# Patient Record
Sex: Female | Born: 1950 | Race: Black or African American | Hispanic: No | Smoking: Current every day smoker
Health system: Southern US, Community
[De-identification: ages and names within clinical notes are randomized; demographics above are authoritative.]

## PROBLEM LIST (undated history)

## (undated) DIAGNOSIS — I10 Essential (primary) hypertension: Secondary | ICD-10-CM

## (undated) DIAGNOSIS — T7840XA Allergy, unspecified, initial encounter: Secondary | ICD-10-CM

## (undated) DIAGNOSIS — M810 Age-related osteoporosis without current pathological fracture: Secondary | ICD-10-CM

## (undated) DIAGNOSIS — F419 Anxiety disorder, unspecified: Secondary | ICD-10-CM

## (undated) DIAGNOSIS — H3320 Serous retinal detachment, unspecified eye: Secondary | ICD-10-CM

## (undated) DIAGNOSIS — K219 Gastro-esophageal reflux disease without esophagitis: Secondary | ICD-10-CM

## (undated) DIAGNOSIS — H269 Unspecified cataract: Secondary | ICD-10-CM

## (undated) DIAGNOSIS — D649 Anemia, unspecified: Secondary | ICD-10-CM

## (undated) DIAGNOSIS — E785 Hyperlipidemia, unspecified: Secondary | ICD-10-CM

## (undated) DIAGNOSIS — M419 Scoliosis, unspecified: Secondary | ICD-10-CM

## (undated) DIAGNOSIS — M1990 Unspecified osteoarthritis, unspecified site: Secondary | ICD-10-CM

## (undated) HISTORY — DX: Anemia, unspecified: D64.9

## (undated) HISTORY — DX: Hyperlipidemia, unspecified: E78.5

## (undated) HISTORY — PX: RETINAL DETACHMENT SURGERY: SHX105

## (undated) HISTORY — DX: Unspecified osteoarthritis, unspecified site: M19.90

## (undated) HISTORY — PX: BREAST CYST EXCISION: SHX579

## (undated) HISTORY — PX: CATARACT EXTRACTION: SUR2

## (undated) HISTORY — DX: Essential (primary) hypertension: I10

## (undated) HISTORY — PX: BUNIONECTOMY: SHX129

## (undated) HISTORY — DX: Gastro-esophageal reflux disease without esophagitis: K21.9

## (undated) HISTORY — PX: TONSILLECTOMY: SUR1361

## (undated) HISTORY — PX: LIPOMA EXCISION: SHX5283

## (undated) HISTORY — DX: Scoliosis, unspecified: M41.9

## (undated) HISTORY — DX: Age-related osteoporosis without current pathological fracture: M81.0

## (undated) HISTORY — DX: Allergy, unspecified, initial encounter: T78.40XA

## (undated) HISTORY — DX: Anxiety disorder, unspecified: F41.9

## (undated) HISTORY — DX: Unspecified cataract: H26.9

## (undated) HISTORY — PX: FINGER SURGERY: SHX640

## (undated) HISTORY — DX: Serous retinal detachment, unspecified eye: H33.20

## (undated) HISTORY — PX: EYE SURGERY: SHX253

## (undated) HISTORY — PX: DILATION AND CURETTAGE OF UTERUS: SHX78

---

## 2001-04-06 ENCOUNTER — Encounter: Admission: RE | Admit: 2001-04-06 | Discharge: 2001-04-06 | Payer: Self-pay | Admitting: Family Medicine

## 2001-04-06 ENCOUNTER — Encounter: Payer: Self-pay | Admitting: Family Medicine

## 2001-09-30 ENCOUNTER — Encounter (INDEPENDENT_AMBULATORY_CARE_PROVIDER_SITE_OTHER): Payer: Self-pay | Admitting: *Deleted

## 2001-09-30 ENCOUNTER — Ambulatory Visit (HOSPITAL_BASED_OUTPATIENT_CLINIC_OR_DEPARTMENT_OTHER): Admission: RE | Admit: 2001-09-30 | Discharge: 2001-09-30 | Payer: Self-pay | Admitting: Surgery

## 2002-01-03 ENCOUNTER — Encounter: Payer: Self-pay | Admitting: Family Medicine

## 2002-01-03 ENCOUNTER — Encounter: Admission: RE | Admit: 2002-01-03 | Discharge: 2002-01-03 | Payer: Self-pay | Admitting: Family Medicine

## 2002-06-02 ENCOUNTER — Encounter: Admission: RE | Admit: 2002-06-02 | Discharge: 2002-06-02 | Payer: Self-pay | Admitting: Family Medicine

## 2002-06-02 ENCOUNTER — Encounter: Payer: Self-pay | Admitting: Family Medicine

## 2002-06-08 ENCOUNTER — Encounter: Admission: RE | Admit: 2002-06-08 | Discharge: 2002-06-08 | Payer: Self-pay | Admitting: Family Medicine

## 2002-06-08 ENCOUNTER — Encounter: Payer: Self-pay | Admitting: Family Medicine

## 2002-09-12 ENCOUNTER — Ambulatory Visit (HOSPITAL_COMMUNITY): Admission: RE | Admit: 2002-09-12 | Discharge: 2002-09-12 | Payer: Self-pay | Admitting: *Deleted

## 2003-05-17 ENCOUNTER — Emergency Department (HOSPITAL_COMMUNITY): Admission: EM | Admit: 2003-05-17 | Discharge: 2003-05-18 | Payer: Self-pay | Admitting: Emergency Medicine

## 2004-02-01 ENCOUNTER — Encounter: Admission: RE | Admit: 2004-02-01 | Discharge: 2004-02-01 | Payer: Self-pay | Admitting: Family Medicine

## 2004-08-07 ENCOUNTER — Other Ambulatory Visit: Admission: RE | Admit: 2004-08-07 | Discharge: 2004-08-07 | Payer: Self-pay | Admitting: Family Medicine

## 2006-01-13 ENCOUNTER — Other Ambulatory Visit: Admission: RE | Admit: 2006-01-13 | Discharge: 2006-01-13 | Payer: Self-pay | Admitting: Family Medicine

## 2006-01-21 ENCOUNTER — Encounter: Admission: RE | Admit: 2006-01-21 | Discharge: 2006-01-21 | Payer: Self-pay | Admitting: Family Medicine

## 2007-04-18 ENCOUNTER — Emergency Department (HOSPITAL_COMMUNITY): Admission: EM | Admit: 2007-04-18 | Discharge: 2007-04-18 | Payer: Self-pay | Admitting: Emergency Medicine

## 2008-02-02 ENCOUNTER — Ambulatory Visit: Payer: Self-pay | Admitting: Family Medicine

## 2008-02-02 DIAGNOSIS — D179 Benign lipomatous neoplasm, unspecified: Secondary | ICD-10-CM | POA: Insufficient documentation

## 2008-02-02 DIAGNOSIS — E785 Hyperlipidemia, unspecified: Secondary | ICD-10-CM | POA: Insufficient documentation

## 2008-02-02 DIAGNOSIS — I10 Essential (primary) hypertension: Secondary | ICD-10-CM | POA: Insufficient documentation

## 2008-11-28 ENCOUNTER — Telehealth (INDEPENDENT_AMBULATORY_CARE_PROVIDER_SITE_OTHER): Payer: Self-pay | Admitting: *Deleted

## 2008-12-13 ENCOUNTER — Ambulatory Visit: Payer: Self-pay | Admitting: Family Medicine

## 2008-12-13 ENCOUNTER — Encounter: Payer: Self-pay | Admitting: Family Medicine

## 2008-12-13 ENCOUNTER — Other Ambulatory Visit: Admission: RE | Admit: 2008-12-13 | Discharge: 2008-12-13 | Payer: Self-pay | Admitting: Family Medicine

## 2008-12-15 ENCOUNTER — Telehealth (INDEPENDENT_AMBULATORY_CARE_PROVIDER_SITE_OTHER): Payer: Self-pay | Admitting: *Deleted

## 2008-12-19 ENCOUNTER — Encounter: Payer: Self-pay | Admitting: Family Medicine

## 2008-12-20 ENCOUNTER — Encounter: Admission: RE | Admit: 2008-12-20 | Discharge: 2008-12-20 | Payer: Self-pay | Admitting: Family Medicine

## 2009-03-05 ENCOUNTER — Ambulatory Visit: Payer: Self-pay | Admitting: Family Medicine

## 2009-03-05 LAB — CONVERTED CEMR LAB
ALT: 20 units/L (ref 0–35)
AST: 21 units/L (ref 0–37)
Alkaline Phosphatase: 64 units/L (ref 39–117)
Total Bilirubin: 0.9 mg/dL (ref 0.3–1.2)

## 2009-03-08 ENCOUNTER — Encounter (INDEPENDENT_AMBULATORY_CARE_PROVIDER_SITE_OTHER): Payer: Self-pay | Admitting: *Deleted

## 2009-03-08 LAB — CONVERTED CEMR LAB
HDL: 55.9 mg/dL (ref >39.00)
Triglycerides: 73 mg/dL (ref 0.0–149.0)

## 2009-05-24 ENCOUNTER — Encounter: Payer: Self-pay | Admitting: Family Medicine

## 2009-05-24 ENCOUNTER — Ambulatory Visit: Payer: Self-pay | Admitting: Family Medicine

## 2009-05-24 ENCOUNTER — Encounter (INDEPENDENT_AMBULATORY_CARE_PROVIDER_SITE_OTHER): Payer: Self-pay | Admitting: *Deleted

## 2009-05-24 DIAGNOSIS — M549 Dorsalgia, unspecified: Secondary | ICD-10-CM | POA: Insufficient documentation

## 2009-05-24 DIAGNOSIS — R079 Chest pain, unspecified: Secondary | ICD-10-CM | POA: Insufficient documentation

## 2009-05-24 DIAGNOSIS — M25519 Pain in unspecified shoulder: Secondary | ICD-10-CM | POA: Insufficient documentation

## 2009-05-24 DIAGNOSIS — M4120 Other idiopathic scoliosis, site unspecified: Secondary | ICD-10-CM | POA: Insufficient documentation

## 2009-05-25 ENCOUNTER — Ambulatory Visit: Payer: Self-pay | Admitting: Family Medicine

## 2009-05-25 LAB — CONVERTED CEMR LAB
Basophils Absolute: 0.1 10*3/uL (ref 0.0–0.1)
Basophils Relative: 1.4 % (ref 0.0–3.0)
CO2: 33 meq/L — ABNORMAL HIGH (ref 19–32)
Calcium: 9.4 mg/dL (ref 8.4–10.5)
Chloride: 100 meq/L (ref 96–112)
Eosinophils Absolute: 0.1 10*3/uL (ref 0.0–0.7)
Glucose, Bld: 79 mg/dL (ref 70–99)
HCT: 38 % (ref 36.0–46.0)
Hemoglobin: 12.6 g/dL (ref 12.0–15.0)
Lymphocytes Relative: 39.6 % (ref 12.0–46.0)
Lymphs Abs: 1.8 10*3/uL (ref 0.7–4.0)
MCHC: 33.1 g/dL (ref 30.0–36.0)
MCV: 89 fL (ref 78.0–100.0)
Monocytes Absolute: 0.5 10*3/uL (ref 0.1–1.0)
Neutro Abs: 2 10*3/uL (ref 1.4–7.7)
RBC: 4.27 M/uL (ref 3.87–5.11)
RDW: 12.7 % (ref 11.5–14.6)
Sodium: 140 meq/L (ref 135–145)
TSH: 0.24 microintl units/mL — ABNORMAL LOW (ref 0.35–5.50)
Total CK: 97 units/L (ref 7–177)

## 2009-05-28 LAB — CONVERTED CEMR LAB: T3, Free: 2.5 pg/mL (ref 2.3–4.2)

## 2009-06-08 ENCOUNTER — Ambulatory Visit: Payer: Self-pay | Admitting: Family Medicine

## 2009-06-08 ENCOUNTER — Encounter: Payer: Self-pay | Admitting: Family Medicine

## 2009-06-08 DIAGNOSIS — R002 Palpitations: Secondary | ICD-10-CM | POA: Insufficient documentation

## 2009-06-11 ENCOUNTER — Telehealth: Payer: Self-pay | Admitting: Family Medicine

## 2009-06-11 LAB — CONVERTED CEMR LAB: T3, Free: 3 pg/mL (ref 2.3–4.2)

## 2009-06-12 ENCOUNTER — Telehealth: Payer: Self-pay | Admitting: Family Medicine

## 2009-09-06 ENCOUNTER — Ambulatory Visit: Payer: Self-pay | Admitting: Family Medicine

## 2009-09-06 DIAGNOSIS — M25529 Pain in unspecified elbow: Secondary | ICD-10-CM | POA: Insufficient documentation

## 2009-09-06 DIAGNOSIS — F3289 Other specified depressive episodes: Secondary | ICD-10-CM | POA: Insufficient documentation

## 2009-09-06 DIAGNOSIS — F32A Depression, unspecified: Secondary | ICD-10-CM | POA: Insufficient documentation

## 2009-09-06 DIAGNOSIS — R61 Generalized hyperhidrosis: Secondary | ICD-10-CM | POA: Insufficient documentation

## 2009-09-06 DIAGNOSIS — R5383 Other fatigue: Secondary | ICD-10-CM | POA: Insufficient documentation

## 2009-09-06 DIAGNOSIS — L259 Unspecified contact dermatitis, unspecified cause: Secondary | ICD-10-CM | POA: Insufficient documentation

## 2009-09-06 DIAGNOSIS — F329 Major depressive disorder, single episode, unspecified: Secondary | ICD-10-CM

## 2009-09-06 DIAGNOSIS — L309 Dermatitis, unspecified: Secondary | ICD-10-CM | POA: Insufficient documentation

## 2009-09-07 ENCOUNTER — Emergency Department (HOSPITAL_COMMUNITY): Admission: EM | Admit: 2009-09-07 | Discharge: 2009-09-08 | Payer: Self-pay | Admitting: Emergency Medicine

## 2009-09-10 ENCOUNTER — Ambulatory Visit: Payer: Self-pay | Admitting: Family Medicine

## 2009-09-10 ENCOUNTER — Telehealth (INDEPENDENT_AMBULATORY_CARE_PROVIDER_SITE_OTHER): Payer: Self-pay | Admitting: *Deleted

## 2009-09-10 DIAGNOSIS — D72819 Decreased white blood cell count, unspecified: Secondary | ICD-10-CM | POA: Insufficient documentation

## 2009-09-11 ENCOUNTER — Encounter (INDEPENDENT_AMBULATORY_CARE_PROVIDER_SITE_OTHER): Payer: Self-pay | Admitting: *Deleted

## 2009-09-11 LAB — CONVERTED CEMR LAB
Eosinophils Relative: 2.4 % (ref 0.0–5.0)
Hemoglobin: 12.7 g/dL (ref 12.0–15.0)
Lymphocytes Relative: 43 % (ref 12.0–46.0)
Lymphs Abs: 2.1 10*3/uL (ref 0.7–4.0)
MCHC: 34.4 g/dL (ref 30.0–36.0)
MCV: 87.3 fL (ref 78.0–100.0)
Monocytes Relative: 8.2 % (ref 3.0–12.0)
RBC: 4.24 M/uL (ref 3.87–5.11)
RDW: 13.8 % (ref 11.5–14.6)

## 2009-10-01 ENCOUNTER — Ambulatory Visit: Payer: Self-pay | Admitting: Cardiovascular Disease

## 2009-10-01 ENCOUNTER — Ambulatory Visit: Payer: Self-pay

## 2009-10-01 ENCOUNTER — Encounter: Payer: Self-pay | Admitting: Internal Medicine

## 2009-10-01 ENCOUNTER — Encounter: Payer: Self-pay | Admitting: Family Medicine

## 2009-10-01 ENCOUNTER — Ambulatory Visit (HOSPITAL_COMMUNITY): Admission: RE | Admit: 2009-10-01 | Discharge: 2009-10-01 | Payer: Self-pay | Admitting: Family Medicine

## 2009-10-04 ENCOUNTER — Encounter (INDEPENDENT_AMBULATORY_CARE_PROVIDER_SITE_OTHER): Payer: Self-pay | Admitting: *Deleted

## 2009-10-05 ENCOUNTER — Telehealth (INDEPENDENT_AMBULATORY_CARE_PROVIDER_SITE_OTHER): Payer: Self-pay | Admitting: *Deleted

## 2009-10-05 DIAGNOSIS — I498 Other specified cardiac arrhythmias: Secondary | ICD-10-CM | POA: Insufficient documentation

## 2009-11-06 ENCOUNTER — Ambulatory Visit: Payer: Self-pay | Admitting: Internal Medicine

## 2009-11-06 DIAGNOSIS — I4949 Other premature depolarization: Secondary | ICD-10-CM | POA: Insufficient documentation

## 2009-12-17 ENCOUNTER — Ambulatory Visit: Payer: Self-pay | Admitting: Family Medicine

## 2009-12-17 DIAGNOSIS — M542 Cervicalgia: Secondary | ICD-10-CM | POA: Insufficient documentation

## 2010-01-10 ENCOUNTER — Ambulatory Visit: Payer: Self-pay | Admitting: Family Medicine

## 2010-01-10 DIAGNOSIS — R109 Unspecified abdominal pain: Secondary | ICD-10-CM | POA: Insufficient documentation

## 2010-01-10 DIAGNOSIS — R350 Frequency of micturition: Secondary | ICD-10-CM | POA: Insufficient documentation

## 2010-01-10 LAB — CONVERTED CEMR LAB
Bilirubin Urine: NEGATIVE
Nitrite: NEGATIVE
WBC Urine, dipstick: NEGATIVE

## 2010-01-11 ENCOUNTER — Telehealth: Payer: Self-pay | Admitting: Family Medicine

## 2010-01-11 ENCOUNTER — Encounter (INDEPENDENT_AMBULATORY_CARE_PROVIDER_SITE_OTHER): Payer: Self-pay | Admitting: *Deleted

## 2010-01-11 ENCOUNTER — Encounter: Payer: Self-pay | Admitting: Family Medicine

## 2010-01-16 LAB — CONVERTED CEMR LAB
BUN: 14 mg/dL (ref 6–23)
Basophils Relative: 1.1 % (ref 0.0–3.0)
CO2: 32 meq/L (ref 19–32)
Calcium: 9.8 mg/dL (ref 8.4–10.5)
Eosinophils Relative: 4.4 % (ref 0.0–5.0)
GFR calc non Af Amer: 106.7 mL/min (ref >60)
Glucose, Bld: 88 mg/dL (ref 70–99)
HCT: 38.8 % (ref 36.0–46.0)
Hemoglobin: 13.4 g/dL (ref 12.0–15.0)
Lymphs Abs: 2.4 10*3/uL (ref 0.7–4.0)
MCV: 87 fL (ref 78.0–100.0)
Monocytes Absolute: 0.4 10*3/uL (ref 0.1–1.0)
Monocytes Relative: 13 % — ABNORMAL HIGH (ref 3.0–12.0)
RBC: 4.47 M/uL (ref 3.87–5.11)
WBC: 3.4 10*3/uL — ABNORMAL LOW (ref 4.5–10.5)

## 2010-01-17 ENCOUNTER — Encounter: Admission: RE | Admit: 2010-01-17 | Discharge: 2010-01-17 | Payer: Self-pay | Admitting: Family Medicine

## 2010-04-29 ENCOUNTER — Ambulatory Visit
Admission: RE | Admit: 2010-04-29 | Discharge: 2010-04-29 | Payer: Self-pay | Source: Home / Self Care | Attending: Family Medicine | Admitting: Family Medicine

## 2010-05-19 LAB — CONVERTED CEMR LAB
ALT: 14 units/L (ref 0–35)
AST: 20 units/L (ref 0–37)
Alkaline Phosphatase: 50 units/L (ref 39–117)
BUN: 7 mg/dL (ref 6–23)
Basophils Absolute: 0 10*3/uL (ref 0.0–0.1)
Basophils Relative: 0.4 % (ref 0.0–3.0)
Bilirubin, Direct: 0 mg/dL (ref 0.0–0.3)
CO2: 34 meq/L — ABNORMAL HIGH (ref 19–32)
Chloride: 103 meq/L (ref 96–112)
Creatinine, Ser: 0.6 mg/dL (ref 0.4–1.2)
Creatinine, Ser: 0.7 mg/dL (ref 0.4–1.2)
Direct LDL: 171 mg/dL
Eosinophils Relative: 2.8 % (ref 0.0–5.0)
Eosinophils Relative: 4 % (ref 0.0–5.0)
GFR calc non Af Amer: 132.18 mL/min (ref >60)
Glucose, Bld: 104 mg/dL — ABNORMAL HIGH (ref 70–99)
HCT: 38 % (ref 36.0–46.0)
HDL: 52.7 mg/dL (ref >39.00)
Lymphs Abs: 2 10*3/uL (ref 0.7–4.0)
MCV: 88 fL (ref 78.0–100.0)
Monocytes Absolute: 0.1 10*3/uL (ref 0.1–1.0)
Monocytes Absolute: 0.4 10*3/uL (ref 0.1–1.0)
Monocytes Relative: 2.6 % — ABNORMAL LOW (ref 3.0–12.0)
Monocytes Relative: 9.8 % (ref 3.0–12.0)
Neutrophils Relative %: 42.4 % — ABNORMAL LOW (ref 43.0–77.0)
Platelets: 253 10*3/uL (ref 150.0–400.0)
Platelets: 306 10*3/uL (ref 150.0–400.0)
RBC: 4.32 M/uL (ref 3.87–5.11)
RDW: 13.3 % (ref 11.5–14.6)
Sodium: 142 meq/L (ref 135–145)
TSH: 1.52 microintl units/mL (ref 0.35–5.50)
TSH: 1.7 microintl units/mL (ref 0.35–5.50)
Total Protein: 7.7 g/dL (ref 6.0–8.3)
WBC: 2.4 10*3/uL — ABNORMAL LOW (ref 4.5–10.5)
WBC: 4.2 10*3/uL — ABNORMAL LOW (ref 4.5–10.5)

## 2010-05-21 NOTE — Procedures (Signed)
Summary: SUMMARY REPORT  SUMMARY REPORT   Imported By: Mirna Mires 10/03/2009 14:49:49  _____________________________________________________________________  External Attachment:    Type:   Image     Comment:   External Document  Appended Document: SUMMARY REPORT please have pt see cards to discuss her few runs of SVT seen on Holter monitor.  Appended Document: SUMMARY REPORT see phone note

## 2010-05-21 NOTE — Progress Notes (Signed)
Summary: labs  Phone Note Outgoing Call   Call placed by: Doristine Devoid,  Sep 10, 2009 9:18 AM Call placed to: Patient Summary of Call: please repeat CBC next week to see if WBC and neutrophils remain low.  remainder of labs look fine   Follow-up for Phone Call        discuss labs and appt scheduled to recheck..............Marland KitchenDoristine Devoid  Sep 10, 2009 9:21 AM   New Problems: LEUKOCYTOPENIA UNSPECIFIED (ICD-288.50)   New Problems: LEUKOCYTOPENIA UNSPECIFIED (ICD-288.50)

## 2010-05-21 NOTE — Assessment & Plan Note (Signed)
Summary: NEP/SVT ON HOLTER MONITOR   Primary Provider:  Beverely Low  CC:  new patient.  SVT on holter monitor.  Pt states she is still getting flutters.  She feels it started with the cholesterol medicine.Marland Kitchen  History of Present Illness: Mrs. Nieblas is seen at the request of Dr.Tabori because of palpitations and a Holter monitor which demonstrated nonsustained supraventricular tachycardia.  She is a relatively healthy 60 year old woman with a history of hypertension and a 3 month history of palpitations. These are occurring mostly at night. They're worse on her left side. They're aggravated by stress and caffeine. They make her feel like her heart is "skipping". She ended May because her pparticularly bad episode she went to the emergency room on that day she describes her heart is pounding hard but not particularly fast. the electrocardiogram from that visit demonstrated ventricular ectopy with a dimorphic pattern. There was a couplet.  she denies exercise intolerance chest discomfort shortness of breath or peripheral edema.  cardiac evaluation done prior to this visit included an ultrasound that was normal;  TSH in February was normal  Current Medications (verified): 1)  Amlodipine Besylate 10 Mg Tabs (Amlodipine Besylate) .... Take One Tablet Daily 2)  Xalatan 0.005 % Soln (Latanoprost) .... Use One Drop Every Evening 3)  Timolol Maleate 0.25 % Soln (Timolol Maleate) .... Use One Drop Every Evening 4)  Simvastatin 20 Mg Tabs (Simvastatin) .... Take One Tablet At Bedtime 5)  Flexeril 10 Mg Tabs (Cyclobenzaprine Hcl) .Marland Kitchen.. 1 By Mouth Three Times A Day As Needed-Rarely Taking 6)  Ultram 50 Mg Tabs (Tramadol Hcl) .Marland Kitchen.. 1 By Mouth Every 6 Hours As Needed 7)  Alprazolam 0.25 Mg Tabs (Alprazolam) .Marland Kitchen.. 1 By Mouth Three Times A Day As Needed.  Allergies (verified): No Known Drug Allergies  Past History:  Past Medical History: Last updated: 02/02/2008 L eye prosthesis- retinal  detachment Scoliosis HTN Glaucoma Hyperlipidemia  Past Surgical History: Last updated: 02/02/2008 Retinal detachment Lipoma removed  Family History: Last updated: 12/13/2008 Mom- lung CA, HTN MGM- DM, leg amputation Brother and sister in good health no breast or colon cancer in family  Social History: Last updated: 12/13/2008 2 children- 42, 64 (daughters in [place], [place]).  Granddaughter lives w/ pt.  current smoking.  Social ETOH. No drugs.  working at [employer] 1  goes bowling regularly- does league play  Review of Systems       full review of systems was negative apart from a history of present illness and past medical history, except dry mouth doubly related to mouth breathing at night, eructation associated with the above, and allergy diathesis. She is also having night sweats  Vital Signs:  Patient profile:   60 year old female Height:      63.50 inches Weight:      138 pounds BMI:     24.15 Pulse rate:   88 / minute Pulse rhythm:   regular BP sitting:   129 / 86  (left arm) Cuff size:   regular  Vitals Entered By: Judithe Modest CMA (November 06, 2009 2:47 PM)  Physical Exam  General:  Well developed, well nourished, in no acute distress. 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 Neck:  supple without thyromegaly; flat neck veins Chest Wall:  without CVA tenderness scoliosis Lungs:  clear Heart:  regular rate and rhythm without murmurs or gallops Abdomen:  soft nontender without hepatomegaly or midline pulsation Msk:  normal skeletal  structure Pulses:  intact distal pulses Extremities:  no clubbing cyanosis or edema Neurologic:  alert and oriented with grossly normal motor and sensory function Skin:  warm and dry  Cervical Nodes:  without lymph nodes Psych:  engaged affect   EKG  Procedure date:  11/06/2009  Findings:      sinus rhythm at 88 Intervals 0.19 5.07/0.41 QT 0.5 0X line axis leftward at  -16 Poor R-wave progression  Impression & Recommendations:  Problem # 1:  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 I think the SVT while present is not related to her symptoms.  It is modified by stress and caffeine.  We will plan to discontinue her amlodipine and put her on beta blockers. I've given her prescriptions to Inderal LA 80, metoprolol 50, atenolol 50 every 8 she'll take them and random order I will see if we can find one that she tolerates and that works for her PVCs. Her updated medication list for this problem includes:    Amlodipine Besylate 10 Mg Tabs (Amlodipine besylate) .Marland Kitchen... Take one tablet daily  Problem # 2:  HYPERTENSION, BENIGN ESSENTIAL (ICD-401.1) her hypertension is reasonably well controlled. hopefully we can maintain control as we change her medications. Her updated medication list for this problem includes:    Amlodipine Besylate 10 Mg Tabs (Amlodipine besylate) .Marland Kitchen... Take one tablet daily  Other Orders: EKG w/ Interpretation (93000)  Patient Instructions: 1)  Try Beta Blockers one at at time (Inderal LA 80mg  1 tablet daily, Atenolol 50mg  daily, Metoprolol Succinate 50mg  daily).  It doesn't matter which order.  If you find one that you like, call and we will refill it.  2)  Your physician recommends that you schedule a follow-up appointment in: 8 weeks with Dr Graciela Husbands.

## 2010-05-21 NOTE — Letter (Signed)
Summary: Primary Care Consult Scheduled Letter  [facility] [place]  [address] [place], [place] [address]   Phone: [phone]  Fax: [phone]      09/11/2009 MRN: [id number]  Janet Pierce [address] [place], [place]  [address]    Dear [name],    We have scheduled an appointment for you.  At the recommendation of Dr. Neena Rhymes, we have scheduled you with Selena Batten for an Echo and a 48 hour Holter Monitor on 10-01-2009 at 7:30am.  Their address is [address] floor, [place] [address]. The office phone number is [phone].  If this appointment day and time is not convenient for you, please feel free to call the office of the doctor you are being referred to at the number listed above and reschedule the appointment.    It is important for you to keep your scheduled appointments. We are here to make sure you are given good patient care.   Thank you,    Renee, Patient Care Coordinator  at [facility]

## 2010-05-21 NOTE — Progress Notes (Signed)
Summary: Advice  Phone Note Call from Patient   Caller: Patient Summary of Call: Pt states since her TSH levels came back normal, what is she to do now? She is still having the pain in her shoulder and chest. Army Fossa CMA  June 11, 2009 4:47 PM   Follow-up for Phone Call        Lets get the event monitor and we may refer to cardiology at that point---does she have it yet?   Follow-up by: Loreen Freud DO,  June 11, 2009 4:55 PM  Additional Follow-up for Phone Call Additional follow up Details #1::        Pt is aware. she has appt on 06/26/09. Army Fossa CMA  June 11, 2009 5:03 PM

## 2010-05-21 NOTE — Letter (Signed)
Summary: Appointment - Missed  [facility], Main Office  [address]   [place], [place] [address]   Phone: [phone]  Fax: [phone]     January 11, 2010 MRN: [id number]   Janet Pierce [address] [place], [place]  [address]   Dear Ms. YUSUPOV,  Our records indicate you missed your appointment on 01-08-2010 with Dr. Graciela Husbands.                                    It is very important that we reach you to reschedule this appointment. We look forward to participating in your health care needs. Please contact us at the number listed above at your earliest convenience to reschedule this appointment.     Sincerely,    Glass blower/designer

## 2010-05-21 NOTE — Progress Notes (Signed)
Summary: holter results-lmomx2  Phone Note Outgoing Call   Call placed by: Doristine Devoid,  October 05, 2009 3:18 PM Call placed to: Patient Summary of Call: please have pt see cards to discuss her few runs of SVT seen on Holter monitor.  Follow-up for Phone Call        left msg w/ lady to have patient return call.......Marland KitchenDoristine Devoid  October 05, 2009 3:20 PM   left message on machine .........Marland KitchenDoristine Devoid  October 09, 2009 2:26 PM   will mail information to patient.........Marland KitchenDoristine Devoid  October 16, 2009 8:43 AM   New Problems: SUPRAVENTRICULAR TACHYCARDIA (ICD-427.89)   New Problems: SUPRAVENTRICULAR TACHYCARDIA (ICD-427.89)

## 2010-05-21 NOTE — Progress Notes (Signed)
Summary: Med request  Phone Note Call from Patient   Summary of Call: Pt states that she is having a lot of anxiety because she is unsure of what is going on. She said the flutters come and go. She has called cardiology to see if they can see her sooner. She would like to know if there is a medication you can prescribe to help calm her? [facility]- [facility] [address] Army Fossa CMA  June 12, 2009 11:19 AM   Follow-up for Phone Call        xanax 0.25 three times a day as needed -- #60  If this helps, make ov to discuss options. Follow-up by: Loreen Freud DO,  June 12, 2009 12:20 PM  Additional Follow-up for Phone Call Additional follow up Details #1::        Pt is aware. Army Fossa CMA  June 12, 2009 12:56 PM     New/Updated Medications: ALPRAZOLAM 0.25 MG TABS (ALPRAZOLAM) 1 by mouth three times a day as needed. Prescriptions: ALPRAZOLAM 0.25 MG TABS (ALPRAZOLAM) 1 by mouth three times a day as needed.  #60 x 0   Entered by:   Army Fossa CMA   Authorized by:   Loreen Freud DO   Signed by:   Army Fossa CMA on 06/12/2009   Method used:   Printed then faxed to ...       [facility] [phone]* (retail)       [address]       [place], [place]  [address]       Ph: [phone]       Fax: [phone]   RxID:   [id number]

## 2010-05-21 NOTE — Assessment & Plan Note (Signed)
Summary: LITTLE RASH AND FATIGUE AND FLUTTER OF THE HEART=PH   Vital Signs:  Patient profile:   60 year old female Weight:      133 pounds O2 Sat:      98 % on Room air Pulse rate:   106 / minute BP sitting:   120 / 70  (left arm)  Vitals Entered By: Doristine Devoid (Sep 06, 2009 4:08 PM)  O2 Flow:  Room air CC: fatigue and fluttering - failed to go to echo or event monitor    History of Present Illness: 60 yo woman here today for   1) palpitations- described as 'heart fluttering'.  intermittant.  saw Dr Laury Axon for same problem in Feb.  asymptomatic today.  had sxs yesterday- improved w/ antacid.  admits to increased stress recently.  did not go to cards for Holter monitor or ECHO.  2) L arm stiffness- pt rubs tricep and says that this is where it aches.  asymptomatic today.  after discussing pain remembers she has been lifting 40lb bags of mulch.  3) fatigue- pt reports being 'so tired'.  labs all normal at last check.  admits to lack of motivation, withdrawing from people and things she used to enjoy, poor sleep, increased stress.  4) rash- small 'bumps' on arms bilaterally, improved w/ hydrocortisone cream  5) night sweats- stopped periods at 52, now having night sweats.    Current Medications (verified): 1)  Amlodipine Besylate 10 Mg Tabs (Amlodipine Besylate) .... Take One Tablet Daily 2)  Xalatan 0.005 % Soln (Latanoprost) .... Use One Drop Every Evening 3)  Timolol Maleate 0.25 % Soln (Timolol Maleate) .... Use One Drop Every Evening 4)  Simvastatin 20 Mg Tabs (Simvastatin) .... Take One Tablet At Bedtime 5)  Flexeril 10 Mg Tabs (Cyclobenzaprine Hcl) .Marland Kitchen.. 1 By Mouth Three Times A Day As Needed 6)  Ultram 50 Mg Tabs (Tramadol Hcl) .Marland Kitchen.. 1 By Mouth Every 6 Hours As Needed 7)  Alprazolam 0.25 Mg Tabs (Alprazolam) .Marland Kitchen.. 1 By Mouth Three Times A Day As Needed.  Allergies (verified): No Known Drug Allergies  Past History:  Past Medical History: Last updated: 02/02/2008 L eye  prosthesis- retinal detachment Scoliosis HTN Glaucoma Hyperlipidemia  Social History: Last updated: 12/13/2008 2 children- 22, 106 (daughters in [place], [place]).  Granddaughter lives w/ pt.  current smoking.  Social ETOH. No drugs.  working at [employer] 1  goes bowling regularly- does league play  Review of Systems      See HPI  Physical Exam  General:  Well-developed,well-nourished,in no acute distress; alert,appropriate and cooperative throughout examination Head:  Normocephalic and atraumatic without obvious abnormalities. No apparent alopecia or balding. Eyes:  L eye prosthesis R eye- PERRL, EOMI Neck:  No deformities, masses, or tenderness noted. Lungs:  Normal respiratory effort, chest expands symmetrically. Lungs are clear to auscultation, no crackles or wheezes. Heart:  normal rate and Grade 2/6 systolic ejection murmur.   Msk:  no limitation in shoulder or elbow ROM, mild TTP over L tricep Pulses:  +2 carotid, radial, DP Extremities:  no C/C/E Neurologic:  alert & oriented X3, cranial nerves II-XII intact, and strength normal in all extremities.     Impression & Recommendations:  Problem # 1:  PALPITATIONS, OCCASIONAL (ICD-785.1) Assessment Unchanged pt had similar in Feb, never followed up w/ cards.  stressed importance of holter monitor.  will recheck labs although they were normal earlier this year. Orders: Venipuncture ([address]) Cardiology Referral (Cardiology) TLB-CBC Platelet - w/Differential (85025-CBCD) TLB-TSH (Thyroid  Stimulating Hormone) (84443-TSH)  Problem # 2:  FATIGUE (ICD-780.79) Assessment: Unchanged check labs.  pt's sxs sound consistent w/ depression.  pt open to idea of starting SSRI.  will follow closely. Orders: TLB-TSH (Thyroid Stimulating Hormone) (84443-TSH) TLB-BMP (Basic Metabolic Panel-BMET) (80048-METABOL)  Problem # 3:  ARM, UPPER, PAIN (VOZ-366.44) Assessment: New pt's sxs consistent w/ muscle strain- pt recalls lifting 40 lb bags of  mulch.  start NSAIDS as needed.  Problem # 4:  DEPRESSIVE DISORDER (ICD-311) start Lexapro (see fatigue above) Her updated medication list for this problem includes:    Alprazolam 0.25 Mg Tabs (Alprazolam) .Marland Kitchen... 1 by mouth three times a day as needed.  Problem # 5:  NIGHT SWEATS (ICD-780.8) Assessment: New possibly related to depression as it would be strange to have hot flashes 6 yrs after menopause.  will follow.  Problem # 6:  ECZEMA (ICD-692.9) Assessment: New well controlled.  OTC hydrocortisone as needed.  Complete Medication List: 1)  Amlodipine Besylate 10 Mg Tabs (Amlodipine besylate) .... Take one tablet daily 2)  Xalatan 0.005 % Soln (Latanoprost) .... Use one drop every evening 3)  Timolol Maleate 0.25 % Soln (Timolol maleate) .... Use one drop every evening 4)  Simvastatin 20 Mg Tabs (Simvastatin) .... Take one tablet at bedtime 5)  Flexeril 10 Mg Tabs (Cyclobenzaprine hcl) .Marland Kitchen.. 1 by mouth three times a day as needed 6)  Ultram 50 Mg Tabs (Tramadol hcl) .Marland Kitchen.. 1 by mouth every 6 hours as needed 7)  Alprazolam 0.25 Mg Tabs (Alprazolam) .Marland Kitchen.. 1 by mouth three times a day as needed.  Patient Instructions: 1)  Please follow up with me in 6 weeks to discuss your depression 2)  Your arm bumps are dry skin/eczema- use lotion and/or hydrocortisone cream 3)  Someone will call you with your cardiology appt 4)  Take the Lexapro- 1 daily 5)  Your arm pain is from a muscle strain- ibuprofen 600mg  (3 pills) every 8 hours will help this pain 6)  We'll notify you of your lab results 7)  If your chest pain worsens please go to the ER 8)  Hang in there!!

## 2010-05-21 NOTE — Assessment & Plan Note (Signed)
Summary: PAIN ON LEFT SIDE DOWN NEAR OVARY//KN   Vital Signs:  Patient profile:   60 year old female Height:      63.50 inches (161.29 cm) Weight:      139.38 pounds (63.35 kg) BMI:     24.39 Temp:     98.2 degrees F (36.78 degrees C) oral BP sitting:   140 / 88  (left arm) Cuff size:   regular  Vitals Entered By: Lucious Groves CMA (January 10, 2010 2:40 PM) CC: C/O left lower abd pain x5 days./kb Is Patient Diabetic? No Pain Assessment Patient in pain? yes     Location: abdomen Intensity: 4 Type: sharp Onset of pain  5 days ago Comments Patient notes that the pain feels like sharp hunger pain or maybe UTI but her only UTI symptoms in more frequent urination. Patient states she is not taking Alprazolam, Flexeril, or Naproxen./kb   History of Present Illness: 60 yo woman here today for LLQ pain x5 days.  progressively worsening.  taking tylenol w/out relief.  + urinary frequency.  feels similar to previous UTIs.  no hx of kidney stones.  pain initially started in the back but radiated around to lower abd.  no hematuria.  no N/V/D.  having regular BMs.  no fevers.  post menopausal (6 yrs ago was last period).  Current Medications (verified): 1)  Amlodipine Besylate 10 Mg Tabs (Amlodipine Besylate) .... Take One Tablet Daily 2)  Xalatan 0.005 % Soln (Latanoprost) .... Use One Drop Every Evening 3)  Timolol Maleate 0.25 % Soln (Timolol Maleate) .... Use One Drop Every Evening 4)  Simvastatin 20 Mg Tabs (Simvastatin) .... Take One Tablet At Bedtime 5)  Cipro 500 Mg Tabs (Ciprofloxacin Hcl) .Marland Kitchen.. 1 By Mouth 2 Times Daily  Allergies (verified): No Known Drug Allergies  Past History:  Past Medical History: Last updated: 02/02/2008 L eye prosthesis- retinal detachment Scoliosis HTN Glaucoma Hyperlipidemia  Review of Systems      See HPI  Physical Exam  General:  Well-developed,well-nourished,in no acute distress; alert,appropriate and cooperative throughout  examination Lungs:  Normal respiratory effort, chest expands symmetrically. Lungs are clear to auscultation, no crackles or wheezes. Heart:  normal rate and Grade 2/6 systolic ejection murmur.   Abdomen:  soft, NT/ND, +BS.  no rebound/guarding.  no CVA or suprapubic tenderness.  pt reports 'pain is deeper than you can push'.   Impression & Recommendations:  Problem # 1:  ABDOMINAL PAIN (ICD-789.00) Assessment New pt's pain not reproducible on PE.  give RBCs in UA will tx as UTI- pt also feels this is similar to previous infxns.  must consider kidney stone.  discussed the idea of CT scan w/ pt.  will defer at this time and check on pt in AM.  if pain is worse, pt to have CT scan.  check labs to r/o infxn or electrolyte imbalance.  reviewed supportive care and red flags that should prompt return.  Pt expresses understanding and is in agreement w/ this plan. Orders: Venipuncture (36644) TLB-CBC Platelet - w/Differential (85025-CBCD) TLB-BMP (Basic Metabolic Panel-BMET) (80048-METABOL) Specimen Handling (03474)  Problem # 2:  URINARY FREQUENCY (ICD-788.41) Assessment: New see above. Orders: UA Dipstick W/ Micro (manual) (25956) T-Urine Culture (Spectrum Order) [id number]) Specimen Handling (99000)  Complete Medication List: 1)  Amlodipine Besylate 10 Mg Tabs (Amlodipine besylate) .... Take one tablet daily 2)  Xalatan 0.005 % Soln (Latanoprost) .... Use one drop every evening 3)  Timolol Maleate 0.25 % Soln (Timolol maleate) .... Use  one drop every evening 4)  Simvastatin 20 Mg Tabs (Simvastatin) .... Take one tablet at bedtime 5)  Cipro 500 Mg Tabs (Ciprofloxacin hcl) .Marland Kitchen.. 1 by mouth 2 times daily  Patient Instructions: 1)  This appears to be a urine infection 2)  Take the Cipro as directed- take w/ food to avoid upset stomach 3)  Drink plenty of fluids 4)  If the pain worsens- call or go to the ER or Urgent Care 5)  We can always CT scan tomorrow 6)  Try Gas-X over the  counter 7)  Hang in there!!! Prescriptions: CIPRO 500 MG TABS (CIPROFLOXACIN HCL) 1 by mouth 2 times daily  #10 x 0   Entered and Authorized by:   Neena Rhymes MD   Signed by:   Neena Rhymes MD on 01/10/2010   Method used:   [facility] to        [facility] [phone][facility] (retail)       [address]       [place], [place]  [address]       Ph: [phone]       Fax: [phone]   RxID:   [id number]   Laboratory Results   Urine Tests  Date/Time Received: Lucious Groves [facility]  January 10, 2010 2:47 PM  Date/Time Reported: Lucious Groves [facility]  January 10, 2010 2:47 PM   Routine Urinalysis   Color: yellow Appearance: Clear Bilirubin: negative   (Normal Range: Negative) Ketone: trace (5)   (Normal Range: Negative) Spec. Gravity: 1.010   (Normal Range: 1.003-1.035) Blood: large   (Normal Range: Negative) pH: 7.5   (Normal Range: 5.0-8.0) Protein: negative   (Normal Range: Negative) Urobilinogen: 0.2   (Normal Range: 0-1) Nitrite: negative   (Normal Range: Negative) Leukocyte Esterace: negative   (Normal Range: Negative)         Not Administered:    Influenza Vaccine 1 not given due to: declined

## 2010-05-21 NOTE — Assessment & Plan Note (Signed)
Summary: NECK PAIN/KN   Vital Signs:  Patient profile:   60 year old female Weight:      138 pounds Pulse rate:   105 / minute BP sitting:   120 / 82  (left arm)  Vitals Entered By: Doristine Devoid CMA (December 17, 2009 11:59 AM) CC: neck pain mainly on R side of neck    History of Present Illness: 60 yo woman here today for neck pain.  R sided.  started 1 week ago.  had a bowling tournament.  pain is located over R trapezius- pt is R hand dominant.  no numbness or tingling of arm or hand.  no weakness.  Current Medications (verified): 1)  Amlodipine Besylate 10 Mg Tabs (Amlodipine Besylate) .... Take One Tablet Daily 2)  Xalatan 0.005 % Soln (Latanoprost) .... Use One Drop Every Evening 3)  Timolol Maleate 0.25 % Soln (Timolol Maleate) .... Use One Drop Every Evening 4)  Simvastatin 20 Mg Tabs (Simvastatin) .... Take One Tablet At Bedtime 5)  Flexeril 10 Mg Tabs (Cyclobenzaprine Hcl) .Marland Kitchen.. 1 By Mouth Three Times A Day As Needed 6)  Alprazolam 0.25 Mg Tabs (Alprazolam) .Marland Kitchen.. 1 By Mouth Three Times A Day As Needed. 7)  Naproxen 500 Mg Tabs (Naproxen) .Marland Kitchen.. 1 Tab By Mouth Two Times A Day X7 Days and Then As Needed.  Take W/ Food  Allergies (verified): No Known Drug Allergies  Review of Systems      See HPI  Physical Exam  General:  Well-developed,well-nourished,in no acute distress; alert,appropriate and cooperative throughout examination Neck:  + trapezius spasm on R Msk:  no limitation of shoulder ROM.   Pulses:  +2 carotid, radial Extremities:  no C/C/E   Impression & Recommendations:  Problem # 1:  NECK PAIN (ICD-723.1) Assessment New pt's pain is due to trapezius spasm.  start NSAIDs and flexeril.  reviewed supportive care and red flags that should prompt return.  Pt expresses understanding and is in agreement w/ this plan. The following medications were removed from the medication list:    Ultram 50 Mg Tabs (Tramadol hcl) .Marland Kitchen... 1 by mouth every 6 hours as needed Her  updated medication list for this problem includes:    Flexeril 10 Mg Tabs (Cyclobenzaprine hcl) .Marland Kitchen... 1 by mouth three times a day as needed    Naproxen 500 Mg Tabs (Naproxen) .Marland Kitchen... 1 tab by mouth two times a day x7 days and then as needed.  take w/ food  Complete Medication List: 1)  Amlodipine Besylate 10 Mg Tabs (Amlodipine besylate) .... Take one tablet daily 2)  Xalatan 0.005 % Soln (Latanoprost) .... Use one drop every evening 3)  Timolol Maleate 0.25 % Soln (Timolol maleate) .... Use one drop every evening 4)  Simvastatin 20 Mg Tabs (Simvastatin) .... Take one tablet at bedtime 5)  Flexeril 10 Mg Tabs (Cyclobenzaprine hcl) .Marland Kitchen.. 1 by mouth three times a day as needed 6)  Alprazolam 0.25 Mg Tabs (Alprazolam) .Marland Kitchen.. 1 by mouth three times a day as needed. 7)  Naproxen 500 Mg Tabs (Naproxen) .Marland Kitchen.. 1 tab by mouth two times a day x7 days and then as needed.  take w/ food  Patient Instructions: 1)  Please schedule your complete physical in the next few weeks at your convenience- do not eat before this appt 2)  Your muscle is in spasm- take the flexeril as needed for tightness.  (this will cause drowsiness) 3)  Use the Naproxen as directed for the inflammation 4)  Alternate ice  and heat for pain relief 5)  Hang in there!! Prescriptions: NAPROXEN 500 MG TABS (NAPROXEN) 1 tab by mouth two times a day x7 days and then as needed.  take w/ food  #60 x 0   Entered and Authorized by:   Neena Rhymes MD   Signed by:   Neena Rhymes MD on 12/17/2009   Method used:   [facility] to        [facility] [phone]* (retail)       [address]       [place], [place]  [address]       Ph: [phone]       Fax: [phone]   RxID:   [id number] FLEXERIL 10 MG TABS (CYCLOBENZAPRINE HCL) 1 by mouth three times a day as needed  #45 x 0   Entered and Authorized by:   Neena Rhymes MD   Signed by:   Neena Rhymes MD on 12/17/2009   Method used:   Electronically  to        [facility] [phone]* (retail)       [address]       [place], [place]  [address]       Ph: [phone]       Fax: [phone]   RxID:   [id number]

## 2010-05-21 NOTE — Letter (Signed)
Summary: Work Dietitian at [employer]  [address] [place], [place] [address]   Phone: [phone]  Fax: [phone]    Today's Date: May 24, 2009  Name of [name]  The above named patient had a medical visit today at: 1:30  am / pm.  Please take this into consideration when reviewing the time away from work/school.    Special Instructions:  [  ] None  [  ] To be off the remainder of today, returning to the normal work / school schedule tomorrow.  [  ] To be off until the next scheduled appointment on ______________________.  [  ] Other ________________________________________________________________ ________________________________________________________________________   Sincerely yours,   Loreen Freud, DO

## 2010-05-21 NOTE — Progress Notes (Signed)
Summary: check on pt  Phone Note Outgoing Call   Summary of Call: please call pt and see how she is feeling.  if her abd pain is worse she will need a CT scan. Initial call taken by: Neena Rhymes MD,  January 11, 2010 9:16 AM  Follow-up for Phone Call        Left message on machine at home to call back to office. Work number is no longer in service. Lucious Groves CMA  January 11, 2010 9:18 AM   Additional Follow-up for Phone Call Additional follow up Details #1::        PATIENT CALLED--SAYS SHE IS FEELING MUCH BETTER--HAS FOUR DAYS LEFT OF MEDICATION Additional Follow-up by: Jerolyn Shin,  January 11, 2010 2:20 PM    Additional Follow-up for Phone Call Additional follow up Details #2::    wonderful news.  no need for CT at this time. Follow-up by: Neena Rhymes MD,  January 16, 2010 9:13 AM

## 2010-05-21 NOTE — Procedures (Signed)
Summary: Summary Report  Summary Report   Imported By: Erle Crocker 11/09/2009 09:49:11  _____________________________________________________________________  External Attachment:    Type:   Image     Comment:   External Document

## 2010-05-21 NOTE — Letter (Signed)
   [place] [facility] [address] [place], [place]  [address] Phone: [phone]      October 04, 2009   Janet Pierce [address] [place], [place] [address]  RE:  LAB RESULTS  Dear  [name],  The following is an interpretation of your most recent lab tests.  Please take note of any instructions provided or changes to medications that have resulted from your lab work.    ECHO- normal echo- no structural cause for pt's palpitations

## 2010-05-21 NOTE — Assessment & Plan Note (Signed)
Summary: pain in breast area/kdc   Vital Signs:  Patient profile:   60 year old female Weight:      141.50 pounds O2 Sat:      97 % on Room air Temp:     98.3 degrees F oral Pulse rate:   84 / minute Pulse rhythm:   regular BP sitting:   120 / 74  (left arm) Cuff size:   regular  Vitals Entered By: Army Fossa CMA (May 24, 2009 1:46 PM)  O2 Flow:  Room air CC: pain in left side radiating from neck down Comments pt. wants to discuss the simvastatin, pt. states she forgets to take it   History of Present Illness:       This is a 60 year old woman who presents with Chest Pain.  The patient reports resting chest pain, but denies exertional chest pain, nausea, vomiting, diaphoresis, shortness of breath, palpitations, dizziness, light headedness, syncope, and indigestion.  The pain is described as constant and sharp.  The pain is located in the left anterior chest and neck.  Pt is relieved with bengay and IB.   Pt also c/o back pain and c/o hx scoliosis.  She is afraid it has gotten worse.    Current Medications (verified): 1)  Amlodipine Besylate 10 Mg Tabs (Amlodipine Besylate) .... Take One Tablet Daily 2)  Xalatan 0.005 % Soln (Latanoprost) .... Use One Drop Every Evening 3)  Timolol Maleate 0.25 % Soln (Timolol Maleate) .... Use One Drop Every Evening 4)  Simvastatin 20 Mg Tabs (Simvastatin) .... Take One Tablet At Bedtime 5)  Flexeril 10 Mg Tabs (Cyclobenzaprine Hcl) .Marland Kitchen.. 1 By Mouth Three Times A Day As Needed 6)  Ultram 50 Mg Tabs (Tramadol Hcl) .Marland Kitchen.. 1 By Mouth Every 6 Hours As Needed  Allergies (verified): No Known Drug Allergies  Family History: Reviewed history from 12/13/2008 and no changes required. Mom- lung CA, HTN MGM- DM, leg amputation Brother and sister in good health no breast or colon cancer in family  Social History: Reviewed history from 12/13/2008 and no changes required. 2 children- 80, 46 (daughters in [place], [place]).  Granddaughter lives w/  pt.  current smoking.  Social ETOH. No drugs.  working at [employer] 1  goes bowling regularly- does league play  Review of Systems      See HPI  Physical Exam  General:  Well-developed,well-nourished,in no acute distress; alert,appropriate and cooperative throughout examination Neck:  No deformities, masses, or tenderness noted. Lungs:  Normal respiratory effort, chest expands symmetrically. Lungs are clear to auscultation, no crackles or wheezes. Heart:  normal rate and no murmur.   Abdomen:  Bowel sounds positive,abdomen soft and non-tender without masses, organomegaly or hernias noted. Msk:  normal ROM, no joint swelling, no joint warmth, no redness over joints, no joint deformities, no joint instability, and no crepitation.   + pain in l shoulder with palpation,  + muscle spasm l trap Neurologic:  alert & oriented X3, cranial nerves II-XII intact, and strength normal in all extremities.   Psych:  Oriented X3 and normally interactive.     Impression & Recommendations:  Problem # 1:  CHEST PAIN UNSPECIFIED (ICD-786.50)  Orders: Venipuncture (44010) TLB-Cardiac Panel (27253_66440-HKVQ) TLB-TSH (Thyroid Stimulating Hormone) (84443-TSH) TLB-BMP (Basic Metabolic Panel-BMET) (80048-METABOL) TLB-CBC Platelet - w/Differential (85025-CBCD) T-D-Dimer Fibrin Derivatives Quantitive (25956-38756) EKG w/ Interpretation (93000)  Problem # 2:  SCOLIOSIS (ICD-737.30)  Orders: T-Lumbar Spine 2 Views (72100TC) T-Thoracic Spine 2 Views (43329JJ) EKG w/ Interpretation (93000)  Problem # 3:  BACK PAIN (ICD-724.5)  Her updated medication list for this problem includes:    Flexeril 10 Mg Tabs (Cyclobenzaprine hcl) .Marland Kitchen... 1 by mouth three times a day as needed    Ultram 50 Mg Tabs (Tramadol hcl) .Marland Kitchen... 1 by mouth every 6 hours as needed  Orders: T-Lumbar Spine 2 Views (72100TC) T-Thoracic Spine 2 Views (09323FT) EKG w/ Interpretation (93000)  Complete Medication List: 1)  Amlodipine Besylate  10 Mg Tabs (Amlodipine besylate) .... Take one tablet daily 2)  Xalatan 0.005 % Soln (Latanoprost) .... Use one drop every evening 3)  Timolol Maleate 0.25 % Soln (Timolol maleate) .... Use one drop every evening 4)  Simvastatin 20 Mg Tabs (Simvastatin) .... Take one tablet at bedtime 5)  Flexeril 10 Mg Tabs (Cyclobenzaprine hcl) .Marland Kitchen.. 1 by mouth three times a day as needed 6)  Ultram 50 Mg Tabs (Tramadol hcl) .Marland Kitchen.. 1 by mouth every 6 hours as needed  Patient Instructions: 1)  pt instructed to go to ER if symptoms worsen or change  Prescriptions: ULTRAM 50 MG TABS (TRAMADOL HCL) 1 by mouth every 6 hours as needed  #30 x 0   Entered and Authorized by:   Loreen Freud DO   Signed by:   Loreen Freud DO on 05/24/2009   Method used:   [facility] to        [facility] [phone]* (retail)       [address]       [place], [place]  [address]       Ph: [phone]       Fax: [phone]   RxID:   [id number] FLEXERIL 10 MG TABS (CYCLOBENZAPRINE HCL) 1 by mouth three times a day as needed  #30 x 0   Entered and Authorized by:   Loreen Freud DO   Signed by:   Loreen Freud DO on 05/24/2009   Method used:   [facility]ronically to        [facility] [phone]* (retail)       [address]       [place], [place]  [address]       Ph: [phone]       Fax: [phone]   RxID:   [id number]    EKG  Procedure date:  05/24/2009  Findings:      Normal sinus rhythm with rate of:  88 bpm

## 2010-05-21 NOTE — Assessment & Plan Note (Signed)
Summary: CHEST FLUTTERS OR PALPITATIONS--OK PER CHEMIRA TO ADD////SPH   Vital Signs:  Patient profile:   60 year old female Weight:      140 pounds O2 Sat:      99 % on Room air Pulse (ortho):   88 / minute Pulse rhythm:   regular BP sitting:   132 / 82  (left arm) Cuff size:   regular  Vitals Entered By: Army Fossa CMA (June 08, 2009 2:38 PM)  O2 Flow:  Room air  CC: Pt c/o flutters in chest, none today, noticed over the past 2-3 weeks, had some last night.    History of Present Illness: Pt here c/o chest palpatations over last 2-3 days----none today.  No CP , no SOB.  Palpatations started 7pm and stopped 9p.    Allergies (verified): No Known Drug Allergies  Past History:  Past medical, surgical, family and social histories (including risk factors) reviewed for relevance to current acute and chronic problems.  Past Medical History: Reviewed history from 02/02/2008 and no changes required. L eye prosthesis- retinal detachment Scoliosis HTN Glaucoma Hyperlipidemia  Past Surgical History: Reviewed history from 02/02/2008 and no changes required. Retinal detachment Lipoma removed  Family History: Reviewed history from 12/13/2008 and no changes required. Mom- lung CA, HTN MGM- DM, leg amputation Brother and sister in good health no breast or colon cancer in family  Social History: Reviewed history from 12/13/2008 and no changes required. 2 children- 29, 18 (daughters in [place], [place]).  Granddaughter lives w/ pt.  current smoking.  Social ETOH. No drugs.  working at [employer] 1  goes bowling regularly- does league play  Review of Systems      See HPI  Physical Exam  General:  Well-developed,well-nourished,in no acute distress; alert,appropriate and cooperative throughout examination Lungs:  Normal respiratory effort, chest expands symmetrically. Lungs are clear to auscultation, no crackles or wheezes. Heart:  normal rate and Grade 2  /6 systolic  ejection murmur.   Extremities:  No clubbing, cyanosis, edema, or deformity noted with normal full range of motion of all joints.   Psych:  Cognition and judgment appear intact. Alert and cooperative with normal attention span and concentration. No apparent delusions, illusions, hallucinations   Impression & Recommendations:  Problem # 1:  PALPITATIONS, OCCASIONAL (ICD-785.1)  Orders: Cardiology Referral (Cardiology) Venipuncture (95621) Echo Referral (Echo) EKG w/ Interpretation (93000)  Avoid caffeine, chocolate, and stimulants. Stress reduction as well as medication options discussed.   Complete Medication List: 1)  Amlodipine Besylate 10 Mg Tabs (Amlodipine besylate) .... Take one tablet daily 2)  Xalatan 0.005 % Soln (Latanoprost) .... Use one drop every evening 3)  Timolol Maleate 0.25 % Soln (Timolol maleate) .... Use one drop every evening 4)  Simvastatin 20 Mg Tabs (Simvastatin) .... Take one tablet at bedtime 5)  Flexeril 10 Mg Tabs (Cyclobenzaprine hcl) .Marland Kitchen.. 1 by mouth three times a day as needed 6)  Ultram 50 Mg Tabs (Tramadol hcl) .Marland Kitchen.. 1 by mouth every 6 hours as needed  Patient Instructions: 1)  Go to ER if symptoms return   EKG  Procedure date:  06/08/2009  Findings:      Normal sinus rhythm with rate of:  80 bpm

## 2010-05-23 NOTE — Assessment & Plan Note (Signed)
Summary: neck pain//lch   Vital Signs:  Patient profile:   60 year old female Weight:      140 pounds BMI:     24.50 Pulse rate:   66 / minute BP sitting:   120 / 80  (left arm)  Vitals Entered By: Doristine Devoid CMA (April 29, 2010 2:25 PM) CC: R side neck pain ongoing heat and ibruprofen not helping   History of Present Illness: 60 yo woman here today for R sided neck pain.  saw Dr Leeanne Deed and was given prednisone and did not take it appropriately for her foot pain.  foot pain has caused her to alter her stance and gait.  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  using heating pad, ben gay w/ some relief.  pain localized to 'muscle at base of neck'.  took 2 ibuprofen this AM w/ some relief.  Current Medications (verified): 1)  Amlodipine Besylate 10 Mg Tabs (Amlodipine Besylate) .... Take One Tablet Daily 2)  Xalatan 0.005 % Soln (Latanoprost) .... Use One Drop Every Evening 3)  Timolol Maleate 0.25 % Soln (Timolol Maleate) .... Use One Drop Every Evening 4)  Simvastatin 20 Mg Tabs (Simvastatin) .... Take One Tablet At Bedtime  Allergies (verified): No Known Drug Allergies  Review of Systems      See HPI  Physical Exam  General:  Well-developed,well-nourished,in no acute distress; alert,appropriate and cooperative throughout examination Head:  Normocephalic and atraumatic without obvious abnormalities. No apparent alopecia or balding. Neck:  + TTP over R trapezius, + spasm.  pain w/ flexion, not extension.   Impression & Recommendations:  Problem # 1:  NECK PAIN (ICD-723.1) Assessment Unchanged + trapezius spasm on R.  start NSAIDs and muscle relaxers.  heat as needed.  reviewed supportive care and red flags that should prompt return.  Pt expresses understanding and is in agreement w/ this plan. Her updated medication list for this problem includes:    Cyclobenzaprine Hcl 10 Mg Tabs (Cyclobenzaprine hcl) .Marland Kitchen... 1 by mouth 2 times daily as needed for back pain  Complete  Medication List: 1)  Amlodipine Besylate 10 Mg Tabs (Amlodipine besylate) .... Take one tablet daily 2)  Xalatan 0.005 % Soln (Latanoprost) .... Use one drop every evening 3)  Timolol Maleate 0.25 % Soln (Timolol maleate) .... Use one drop every evening 4)  Simvastatin 20 Mg Tabs (Simvastatin) .... Take one tablet at bedtime 5)  Cyclobenzaprine Hcl 10 Mg Tabs (Cyclobenzaprine hcl) .Marland Kitchen.. 1 by mouth 2 times daily as needed for back pain  Patient Instructions: 1)  Your neck pain is a muscle spasm 2)  Take 2-3 ibuprofen/Advil every 8 hrs as needed for inflammation.  Take w/ food to avoid upset stomach 3)  Continue to heat regularly 4)  Use the muscle relaxer at night and on weekends (no driving!) for pain and spasm relief 5)  Do some gentle stretching after you heat 6)  Call with any questions or concerns 7)  Hang in there!!  Prescriptions: CYCLOBENZAPRINE HCL 10 MG  TABS (CYCLOBENZAPRINE HCL) 1 by mouth 2 times daily as needed for back pain  #30 x 0   Entered and Authorized by:   Neena Rhymes MD   Signed by:   Neena Rhymes MD on 04/29/2010   Method used:   [facility] to        [facility] [phone]* (retail)       [address]       [place]  [place], [place]  [address]       Ph: [phone]       Fax: [phone]   RxID:   [id number]    Orders Added: 1)  Est. Patient Level III [[address]]

## 2010-09-03 ENCOUNTER — Encounter: Payer: Self-pay | Admitting: *Deleted

## 2010-09-03 ENCOUNTER — Other Ambulatory Visit: Payer: Self-pay | Admitting: *Deleted

## 2010-09-03 ENCOUNTER — Encounter: Payer: Self-pay | Admitting: Family Medicine

## 2010-09-03 ENCOUNTER — Other Ambulatory Visit: Payer: Self-pay | Admitting: Family Medicine

## 2010-09-03 NOTE — Telephone Encounter (Addendum)
This med was last refilled Jan 2012 with notation that CPX and labs were needed. Left message on machine to call the office

## 2010-09-04 MED ORDER — SIMVASTATIN 20 MG PO TABS: 20.0000 mg | ORAL_TABLET | Freq: Every day | ORAL | Status: DC

## 2010-09-04 NOTE — Telephone Encounter (Signed)
Pt scheduled CPX.

## 2010-09-06 NOTE — Op Note (Signed)
[place]  Patient:    Janet Pierce, Janet Pierce Visit Number: [id number] MRN: [id number]          Service Type: DSU Location: [facility] Attending Physician:  Shelly Rubenstein Dictated by:   Abigail Miyamoto, M.D. Proc. Date: 09/30/01 Admit Date:  09/30/2001 Discharge Date: 09/30/2001                             Operative Report  PREOPERATIVE DIAGNOSIS:  Left thigh mass.  POSTOPERATIVE DIAGNOSIS:  Left thigh mass.  PROCEDURE:  Excision of 11 x 7 cm left thigh mass.  SURGEON:  Abigail Miyamoto, M.D.  ANESTHESIA:  1% lidocaine and monitored anesthesia care.  ESTIMATED BLOOD LOSS:  Minimal.  DESCRIPTION OF PROCEDURE:  The patient was brought to the operating room and identified as Janet Pierce.  She was placed supine on the operating room table, and then anesthesia was induced.  Her left thigh was prepped and draped in the usual sterile fashion.  The skin overlying the large mass was anesthetized with 1% lidocaine.  A longitudinal incision was made over the mass.  The incision was taken down to the level of the mass with the electrocautery.  The large mass was then excised circumferentially with the electrocautery.  It appeared consistent with a lipoma.  The mass was sent to pathology for further identification.  It measured approximately 11 x 7 cm in size.  The cavity was then copiously irrigated with normal saline.  Hemostasis was then achieved with the cautery.  The subcutaneous layer was then closed with interrupted 3-0 Vicryl sutures, and the skin was closed with a running 4-0 Monocryl.  Steri-Strips, gauze, and tape were then applied.  The patient tolerated the procedure well.  All sponge, needle, and instrument counts were correct at the end of the procedure.  The patient was then taken in stable condition from the operating room to the recovery room. Dictated by:   Abigail Miyamoto, M.D. Attending Physician:  Shelly Rubenstein DD:   09/30/01 TD:  10/02/01 Job: [id number]

## 2010-09-11 ENCOUNTER — Ambulatory Visit (INDEPENDENT_AMBULATORY_CARE_PROVIDER_SITE_OTHER): Payer: BC Managed Care – PPO | Admitting: Family Medicine

## 2010-09-11 ENCOUNTER — Encounter: Payer: Self-pay | Admitting: Family Medicine

## 2010-09-11 DIAGNOSIS — E785 Hyperlipidemia, unspecified: Secondary | ICD-10-CM

## 2010-09-11 DIAGNOSIS — Z0000 Encounter for general adult medical examination without abnormal findings: Secondary | ICD-10-CM | POA: Insufficient documentation

## 2010-09-11 DIAGNOSIS — I10 Essential (primary) hypertension: Secondary | ICD-10-CM

## 2010-09-11 LAB — LIPID PANEL
Cholesterol: 172 mg/dL (ref 0–200)
HDL: 53.2 mg/dL (ref >39.00)
LDL Cholesterol: 102 mg/dL — ABNORMAL HIGH (ref 0–99)
Total CHOL/HDL Ratio: 3
Triglycerides: 84 mg/dL (ref 0.0–149.0)
VLDL: 16.8 mg/dL (ref 0.0–40.0)

## 2010-09-11 LAB — CBC WITH DIFFERENTIAL/PLATELET
Basophils Absolute: 0 10*3/uL (ref 0.0–0.1)
Eosinophils Absolute: 0.2 10*3/uL (ref 0.0–0.7)
HCT: 35.5 % — ABNORMAL LOW (ref 36.0–46.0)
Lymphs Abs: 1.8 10*3/uL (ref 0.7–4.0)
MCHC: 33.8 g/dL (ref 30.0–36.0)
Monocytes Relative: 9.3 % (ref 3.0–12.0)
Neutro Abs: 2.1 10*3/uL (ref 1.4–7.7)
Platelets: 274 10*3/uL (ref 150.0–400.0)
RDW: 13.6 % (ref 11.5–14.6)

## 2010-09-11 LAB — BASIC METABOLIC PANEL
CO2: 31 mEq/L (ref 19–32)
Calcium: 9.1 mg/dL (ref 8.4–10.5)
Creatinine, Ser: 0.7 mg/dL (ref 0.4–1.2)
GFR: 117.7 mL/min (ref >60.00)
Sodium: 141 mEq/L (ref 135–145)

## 2010-09-11 LAB — HEPATIC FUNCTION PANEL
Alkaline Phosphatase: 53 U/L (ref 39–117)
Bilirubin, Direct: 0.1 mg/dL (ref 0.0–0.3)
Total Bilirubin: 0.9 mg/dL (ref 0.3–1.2)
Total Protein: 7 g/dL (ref 6.0–8.3)

## 2010-09-11 NOTE — Assessment & Plan Note (Signed)
Pt's PE WNL.  UTD on health maintenance.  Check labs.  Anticipatory guidance provided.  

## 2010-09-11 NOTE — Progress Notes (Signed)
  Subjective:    Patient ID: Janet Pierce, female    DOB: 09-27-1950, 60 y.o.   MRN: [id number]  HPI CPE- UTD on mammo, pap 2010.  UTD on colonoscopy.   Review of Systems Patient reports no vision/ hearing changes, adenopathy, fever, weight change,  persistant/recurrent hoarseness , swallowing issues, chest pain, palpitations, edema, persistant/recurrent cough, hemoptysis, dyspnea (rest/exertional/paroxysmal nocturnal), gastrointestinal bleeding (melena, rectal bleeding), abdominal pain, significant heartburn, bowel changes, GU symptoms (dysuria, hematuria, incontinence), Gyn symptoms (abnormal  bleeding, pain),  syncope, focal weakness, memory loss, numbness & tingling, skin/hair/nail changes, abnormal bruising or bleeding, anxiety, or depression.    Objective:   Physical Exam  General Appearance:    Alert, cooperative, no distress, appears stated age  Head:    Normocephalic, without obvious abnormality, atraumatic  Eyes:    Deferred to ophtho  Ears:    Normal TM's and external ear canals, both ears  Nose:   Nares normal, septum midline, mucosa normal, no drainage    or sinus tenderness  Throat:   Lips, mucosa, and tongue normal; teeth and gums normal  Neck:   Supple, symmetrical, trachea midline, no adenopathy;    Thyroid: no enlargement/tenderness/nodules  Back:     ROM normal, no CVA tenderness  Lungs:     Clear to auscultation bilaterally, respirations unlabored  Chest Wall:    No tenderness or deformity   Heart:    Regular rate and rhythm, S1 and S2 normal, no murmur, rub   or gallop  Breast Exam:    No tenderness, masses, or nipple abnormality  Abdomen:     Soft, non-tender, bowel sounds active all four quadrants,    no masses, no organomegaly  Genitalia:    Deferred until next year at pt's request (on 3 yr cycle for gyn exams)  Rectal:    Deferred  Extremities:   Extremities normal, atraumatic, no cyanosis or edema  Pulses:   2+ and symmetric all extremities  Skin:   Skin  color, texture, turgor normal, no rashes or lesions  Lymph nodes:   Cervical, supraclavicular, and axillary nodes normal  Neurologic:   CNII-XII intact, normal strength, sensation and reflexes    throughout          Assessment & Plan:

## 2010-09-11 NOTE — Assessment & Plan Note (Signed)
BP well controlled.  Asymptomatic.  No changes at this time.

## 2010-09-11 NOTE — Patient Instructions (Signed)
Follow up in 6 months to recheck blood pressure and cholesterol We'll notify you of your lab results Keep up the good work on diet and exercise I'm so proud of you for working on quitting smoking- you can do it! Call with any questions or concerns Happy Memorial Day!!!

## 2010-09-11 NOTE — Assessment & Plan Note (Signed)
Check labs today- adjust meds prn.

## 2010-09-12 ENCOUNTER — Encounter: Payer: Self-pay | Admitting: *Deleted

## 2010-09-14 LAB — VITAMIN D 1,25 DIHYDROXY: Vitamin D2 1, 25 (OH)2: <8 pg/mL

## 2010-10-03 ENCOUNTER — Ambulatory Visit (INDEPENDENT_AMBULATORY_CARE_PROVIDER_SITE_OTHER): Payer: BC Managed Care – PPO | Admitting: Family Medicine

## 2010-10-03 VITALS — BP 122/80 | Temp 98.9°F | Wt 140.8 lb

## 2010-10-03 DIAGNOSIS — L259 Unspecified contact dermatitis, unspecified cause: Secondary | ICD-10-CM

## 2010-10-03 DIAGNOSIS — L309 Dermatitis, unspecified: Secondary | ICD-10-CM

## 2010-10-03 MED ORDER — TRIAMCINOLONE ACETONIDE 0.1 % EX OINT: TOPICAL_OINTMENT | Freq: Two times a day (BID) | CUTANEOUS | Status: AC

## 2010-10-03 NOTE — Patient Instructions (Signed)
This all appears to be eczema related Use the triamcinolone twice daily as needed for dry, itchy patches Try and keep your skin well moisturized Call with any questions or concerns Hang in there!!!

## 2010-10-03 NOTE — Progress Notes (Signed)
  Subjective:    Patient ID: Janet Pierce, female    DOB: [birth year], 60 y.o.   MRN: [id number]  HPI Rash- noticed yesterday.  On both legs.  Does not itch.  Not contiguous.  'blotchy'.   Review of Systems For ROS see HPI     Objective:   Physical Exam  Constitutional: She appears well-developed and well-nourished. No distress.  Skin: Skin is warm and dry.       Pt's 'rash' is consistent w/ eczematous patches- present on lower legs, wrists, lower back.          Assessment & Plan:

## 2010-10-08 DIAGNOSIS — L309 Dermatitis, unspecified: Secondary | ICD-10-CM | POA: Insufficient documentation

## 2010-10-08 NOTE — Assessment & Plan Note (Signed)
Start steroid ointment prn.  Stressed importance of keeping skin moisturized but not too wet.  Pt expressed understanding and is in agreement w/ plan.

## 2010-10-09 ENCOUNTER — Other Ambulatory Visit: Payer: Self-pay | Admitting: Family Medicine

## 2010-10-09 NOTE — Telephone Encounter (Signed)
Refill sent.

## 2010-11-25 ENCOUNTER — Other Ambulatory Visit: Payer: Self-pay | Admitting: Family Medicine

## 2011-01-01 ENCOUNTER — Ambulatory Visit: Payer: BC Managed Care – PPO | Admitting: Internal Medicine

## 2011-01-24 LAB — POCT CARDIAC MARKERS
CKMB, poc: 1.1
CKMB, poc: <1 — ABNORMAL LOW
Myoglobin, poc: 26.6
Myoglobin, poc: 37.5
Operator id: 4533
Troponin i, poc: <0.05

## 2011-01-24 LAB — CBC
HCT: 37.2
MCHC: 33.5
MCV: 83.7
Platelets: 315
WBC: 6.3

## 2011-01-24 LAB — BASIC METABOLIC PANEL
BUN: 12
CO2: 33 — ABNORMAL HIGH
Chloride: 103
Creatinine, Ser: 0.66
Glucose, Bld: 105 — ABNORMAL HIGH
Potassium: 3.4 — ABNORMAL LOW

## 2011-01-29 ENCOUNTER — Other Ambulatory Visit: Payer: Self-pay | Admitting: Family Medicine

## 2011-02-26 ENCOUNTER — Encounter: Payer: Self-pay | Admitting: Family Medicine

## 2011-02-26 ENCOUNTER — Ambulatory Visit (INDEPENDENT_AMBULATORY_CARE_PROVIDER_SITE_OTHER): Payer: BC Managed Care – PPO | Admitting: Family Medicine

## 2011-02-26 DIAGNOSIS — R109 Unspecified abdominal pain: Secondary | ICD-10-CM

## 2011-02-26 DIAGNOSIS — F3289 Other specified depressive episodes: Secondary | ICD-10-CM

## 2011-02-26 DIAGNOSIS — F329 Major depressive disorder, single episode, unspecified: Secondary | ICD-10-CM

## 2011-02-26 DIAGNOSIS — J309 Allergic rhinitis, unspecified: Secondary | ICD-10-CM

## 2011-02-26 MED ORDER — CITALOPRAM HYDROBROMIDE 20 MG PO TABS: 20.0000 mg | ORAL_TABLET | Freq: Every day | ORAL | Status: DC

## 2011-02-26 NOTE — Patient Instructions (Signed)
Follow up in 1 month to recheck mood Start the Celexa daily Make sure you are eating regularly Start the Prevacid daily to reduce acid production Your headache and dry mouth are due to nasal allergies- start [facility] brand Claritin to improve symptoms Call with any questions or concerns HANG IN THERE!!!

## 2011-02-26 NOTE — Progress Notes (Signed)
  Subjective:    Patient ID: Janet Pierce, female    DOB: [birth year], 60 y.o.   MRN: [id number]  HPI Abdominal pain- sxs started 2 weeks ago.  + anorexia.  'like hunger pains', often when waking.  Denies N/V/D.  No fevers.  Sinus pressure- L sided, behind eye, most noticeable when waking up.  + dry mouth.  sxs will improve throughout the day  Anxiety/Depression- has increased financial stress, not sleeping, not eating.  Very upset.   Review of Systems For ROS see HPI     Objective:   Physical Exam  Vitals reviewed. Constitutional: She appears well-developed and well-nourished. No distress.  HENT:  Head: Normocephalic and atraumatic.  Right Ear: Tympanic membrane normal.  Left Ear: Tympanic membrane normal.  Nose: Mucosal edema and rhinorrhea present. Right sinus exhibits no maxillary sinus tenderness and no frontal sinus tenderness. Left sinus exhibits no maxillary sinus tenderness and no frontal sinus tenderness.  Mouth/Throat: Mucous membranes are normal. Posterior oropharyngeal erythema (w/ PND) present.  Eyes: Conjunctivae and EOM are normal. Pupils are equal, round, and reactive to light.  Neck: Normal range of motion. Neck supple.  Cardiovascular: Normal rate, regular rhythm and normal heart sounds.   Pulmonary/Chest: Effort normal and breath sounds normal. No respiratory distress. She has no wheezes. She has no rales.  Abdominal: Soft. Bowel sounds are normal. She exhibits no distension. There is no tenderness. There is no rebound and no guarding.  Lymphadenopathy:    She has no cervical adenopathy.  Psychiatric:       Very anxious, obviously upset.          Assessment & Plan:

## 2011-02-27 ENCOUNTER — Other Ambulatory Visit: Payer: Self-pay | Admitting: Family Medicine

## 2011-02-27 DIAGNOSIS — R5381 Other malaise: Secondary | ICD-10-CM

## 2011-02-27 DIAGNOSIS — R5383 Other fatigue: Secondary | ICD-10-CM

## 2011-02-28 ENCOUNTER — Other Ambulatory Visit (INDEPENDENT_AMBULATORY_CARE_PROVIDER_SITE_OTHER): Payer: BC Managed Care – PPO

## 2011-02-28 DIAGNOSIS — R5381 Other malaise: Secondary | ICD-10-CM

## 2011-02-28 LAB — BASIC METABOLIC PANEL
Calcium: 9.5 mg/dL (ref 8.4–10.5)
GFR: 101.39 mL/min (ref >60.00)
Glucose, Bld: 92 mg/dL (ref 70–99)
Sodium: 142 mEq/L (ref 135–145)

## 2011-02-28 LAB — CBC WITH DIFFERENTIAL/PLATELET
Basophils Absolute: 0 10*3/uL (ref 0.0–0.1)
Basophils Relative: 0.6 % (ref 0.0–3.0)
Eosinophils Absolute: 0.1 10*3/uL (ref 0.0–0.7)
Lymphocytes Relative: 29.9 % (ref 12.0–46.0)
MCHC: 33.7 g/dL (ref 30.0–36.0)
MCV: 86.4 fl (ref 78.0–100.0)
Monocytes Absolute: 0.5 10*3/uL (ref 0.1–1.0)
Neutrophils Relative %: 58.9 % (ref 43.0–77.0)
Platelets: 322 10*3/uL (ref 150.0–400.0)
RBC: 4.54 Mil/uL (ref 3.87–5.11)
RDW: 14.1 % (ref 11.5–14.6)

## 2011-02-28 LAB — VITAMIN B12: Vitamin B-12: 446 pg/mL (ref 211–911)

## 2011-02-28 NOTE — Progress Notes (Signed)
12  

## 2011-03-09 NOTE — Assessment & Plan Note (Signed)
Currently pt's biggest issue.  Not sleeping or eating due to depression/anxiety.  Start SSRI and follow closely.

## 2011-03-09 NOTE — Assessment & Plan Note (Addendum)
Pt's pain is consistent w/ GERD which is likely worse due to recent stress.  Start PPI.  Reviewed lifestyle modifications.  Will follow closely.

## 2011-03-09 NOTE — Assessment & Plan Note (Signed)
Pt w/out evidence of sinus infxn.  sxs are most likely due to untreated nasal allergies.  Start OTC antihistamine for sxs relief.

## 2011-04-02 ENCOUNTER — Telehealth: Payer: Self-pay | Admitting: *Deleted

## 2011-04-02 NOTE — Telephone Encounter (Signed)
Pt levt vm to advise that she has had a sore throat for two days with no fever, what should her next steps be?

## 2011-04-02 NOTE — Telephone Encounter (Signed)
Spoke to pt to advise results/instructions. Pt understood. Pt advised that she feels better now after taking chloroseptic

## 2011-04-02 NOTE — Telephone Encounter (Signed)
Most likely this is a virus.  Very unlikely to be strep w/out fever.  Take ibuprofen as needed for sore throat, increase fluids, rest.

## 2011-05-15 ENCOUNTER — Other Ambulatory Visit: Payer: Self-pay | Admitting: Family Medicine

## 2011-05-15 MED ORDER — AMLODIPINE BESYLATE 10 MG PO TABS: 10.0000 mg | ORAL_TABLET | Freq: Every day | ORAL | Status: DC

## 2011-05-15 NOTE — Telephone Encounter (Signed)
rx sent to pharmacy by e-script  

## 2011-05-23 ENCOUNTER — Other Ambulatory Visit: Payer: Self-pay | Admitting: *Deleted

## 2011-05-23 MED ORDER — AMLODIPINE BESYLATE 10 MG PO TABS: 10.0000 mg | ORAL_TABLET | Freq: Every day | ORAL | Status: DC

## 2011-05-23 NOTE — Telephone Encounter (Signed)
Pt left vm stating that she thinks she may have told the nurse to send her medication to [facility] however she needs her "analopapene" sent to [facility] per [facility] advised they have sent our office 2 faxes concerning this medication, noted on 05-15-11 #30 with 3 refills were sent for her amlodipine, sent medication via escribe to [facility] and advised pt her Amlodipine had been sent pt understood and clarified this is the correct medication, she has trouble pronouncing the medication names, pt understood

## 2011-08-07 ENCOUNTER — Encounter: Payer: Self-pay | Admitting: Family Medicine

## 2011-08-07 ENCOUNTER — Ambulatory Visit (INDEPENDENT_AMBULATORY_CARE_PROVIDER_SITE_OTHER): Payer: BC Managed Care – PPO | Admitting: Family Medicine

## 2011-08-07 VITALS — BP 125/78 | HR 89 | Temp 98.6°F | Ht 63.5 in | Wt 140.0 lb

## 2011-08-07 DIAGNOSIS — M4120 Other idiopathic scoliosis, site unspecified: Secondary | ICD-10-CM

## 2011-08-07 DIAGNOSIS — M549 Dorsalgia, unspecified: Secondary | ICD-10-CM

## 2011-08-07 MED ORDER — NAPROXEN 500 MG PO TABS: 500.0000 mg | ORAL_TABLET | Freq: Two times a day (BID) | ORAL | Status: DC

## 2011-08-07 MED ORDER — CYCLOBENZAPRINE HCL 10 MG PO TABS: 10.0000 mg | ORAL_TABLET | Freq: Two times a day (BID) | ORAL | Status: DC | PRN

## 2011-08-07 NOTE — Patient Instructions (Signed)
This is a muscle spasm Take the Naproxen for the next 5 days and after that as needed- take w/ food Use the flexeril at night for relief of spasm- this will make you sleepy Heating pad YOGA!!! Continue to wear your sneakers Sherri Rad in there!!!

## 2011-08-07 NOTE — Progress Notes (Signed)
  Subjective:    Patient ID: Janet Pierce, female    DOB: 1950/05/29, 61 y.o.   MRN: [id number]  HPI Back pain- pt w/ hx of scoliosis.  Did some strenuous exercising in [place] 3/30 and has had pain since.  Was told she had to wear flats at work and this was causing pain.  Switched back to tennis shoes and pain has somewhat improved.  Needs note for work.  Pain is R sided, near lower ribs.  Pain doesn't radiate.  Will improve w/ tylenol.  Pain is worse w/ twisting- 'i get a muscle spasm'.  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   Review of Systems For ROS see HPI     Objective:   Physical Exam  Vitals reviewed. Constitutional: She appears well-developed and well-nourished. No distress.  Musculoskeletal:       Thoracic back: She exhibits tenderness, deformity (thoracic curve to R), pain (over muscle spasm below S curve) and spasm (under S curve). She exhibits no bony tenderness and no swelling.          Assessment & Plan:

## 2011-08-10 NOTE — Assessment & Plan Note (Signed)
Deteriorated.  Pt's pain is consistent w/ musculoskeletal and likely due in part the scoliosis.  Start muscle relaxer, NSAID.  Start yoga for stretching.  Note provided for work to allow sneakers.  Reviewed supportive care and red flags that should prompt return.  Pt expressed understanding and is in agreement w/ plan.

## 2011-08-10 NOTE — Assessment & Plan Note (Signed)
Unchanged.  Most likely cause of current pain.

## 2011-11-26 ENCOUNTER — Ambulatory Visit: Payer: BC Managed Care – PPO | Admitting: Family Medicine

## 2011-12-05 ENCOUNTER — Encounter: Payer: Self-pay | Admitting: Family Medicine

## 2011-12-05 ENCOUNTER — Ambulatory Visit (INDEPENDENT_AMBULATORY_CARE_PROVIDER_SITE_OTHER): Payer: BC Managed Care – PPO | Admitting: Family Medicine

## 2011-12-05 VITALS — BP 140/80 | HR 91 | Temp 98.8°F | Wt 135.0 lb

## 2011-12-05 DIAGNOSIS — R141 Gas pain: Secondary | ICD-10-CM

## 2011-12-05 DIAGNOSIS — R079 Chest pain, unspecified: Secondary | ICD-10-CM

## 2011-12-05 DIAGNOSIS — R140 Abdominal distension (gaseous): Secondary | ICD-10-CM

## 2011-12-05 DIAGNOSIS — R142 Eructation: Secondary | ICD-10-CM

## 2011-12-05 MED ORDER — OMEPRAZOLE 20 MG PO CPDR: 20.0000 mg | DELAYED_RELEASE_CAPSULE | Freq: Every day | ORAL | Status: DC

## 2011-12-05 MED ORDER — GI COCKTAIL ~~LOC~~
30.0000 mL | Freq: Once | ORAL | Status: AC
  Administered 2011-12-05: 30 mL via ORAL

## 2011-12-05 NOTE — Progress Notes (Signed)
  Subjective:    Patient ID: Janet Pierce, female    DOB: 1951/03/13, 61 y.o.   MRN: [id number]  HPI Chest pain- L sided, has been using heating pad and tylenol w/out relief.  Not currently hurting.  Pain is localized to under L breast.  Increased gas, belching.  Increased stress.  Did a lot of yard work recently.  sxs started 2 days ago.  No nausea, SOB.  Chest pressure improves w/ belching.  Pain also improves w/ passing gas or BM.  Chewing a lot of gum recently to quit smoking.   Review of Systems For ROS see HPI     Objective:   Physical Exam  Vitals reviewed. Constitutional: She is oriented to person, place, and time. She appears well-developed and well-nourished. No distress.  HENT:  Head: Normocephalic and atraumatic.  Eyes: Conjunctivae and EOM are normal. Pupils are equal, round, and reactive to light.  Neck: Normal range of motion. Neck supple. No thyromegaly present.  Cardiovascular: Normal rate, regular rhythm, normal heart sounds and intact distal pulses.   No murmur heard. Pulmonary/Chest: Effort normal and breath sounds normal. No respiratory distress. She exhibits no tenderness.  Abdominal: Soft. She exhibits distension. She exhibits no mass. There is no tenderness. There is no rebound and no guarding.       Hyperactive BS  Musculoskeletal: She exhibits no edema.  Lymphadenopathy:    She has no cervical adenopathy.  Neurological: She is alert and oriented to person, place, and time.  Skin: Skin is warm and dry.  Psychiatric: She has a normal mood and affect. Her behavior is normal.          Assessment & Plan:

## 2011-12-05 NOTE — Patient Instructions (Addendum)
Start the Omeprazole daily Try and decrease your gum chewing- you may be trapping air Use Gas-X as needed (over the counter) If you have worsening pain, shortness of breath, or other concerns- call or go to the ER Hang in there!

## 2011-12-14 NOTE — Assessment & Plan Note (Signed)
New.  Most likely due to gas pain/pressure.  (see above)  EKG normal.

## 2011-12-14 NOTE — Assessment & Plan Note (Signed)
New.  Suspect that pt's chest pain is due to increased gas.  Discussed impact of gum chewing on increased bowel gas/air.  Pain improves w/ belching/passing gas.  EKG normal.  sxs improved further w/ GI cocktail in office.  Reviewed supportive care and red flags that should prompt return.  Pt expressed understanding and is in agreement w/ plan.

## 2012-02-17 ENCOUNTER — Encounter: Payer: Self-pay | Admitting: Family Medicine

## 2012-02-17 ENCOUNTER — Ambulatory Visit (INDEPENDENT_AMBULATORY_CARE_PROVIDER_SITE_OTHER): Payer: BC Managed Care – PPO | Admitting: Family Medicine

## 2012-02-17 VITALS — BP 127/78 | HR 86 | Temp 98.1°F | Ht 63.25 in | Wt 136.0 lb

## 2012-02-17 DIAGNOSIS — R109 Unspecified abdominal pain: Secondary | ICD-10-CM

## 2012-02-17 DIAGNOSIS — M419 Scoliosis, unspecified: Secondary | ICD-10-CM

## 2012-02-17 DIAGNOSIS — M549 Dorsalgia, unspecified: Secondary | ICD-10-CM

## 2012-02-17 DIAGNOSIS — M4120 Other idiopathic scoliosis, site unspecified: Secondary | ICD-10-CM

## 2012-02-17 MED ORDER — OMEPRAZOLE 20 MG PO CPDR: 20.0000 mg | DELAYED_RELEASE_CAPSULE | Freq: Every day | ORAL | Status: DC

## 2012-02-17 MED ORDER — CELECOXIB 200 MG PO CAPS: 200.0000 mg | ORAL_CAPSULE | Freq: Two times a day (BID) | ORAL | Status: DC

## 2012-02-17 NOTE — Progress Notes (Signed)
  Subjective:    Patient ID: Janet Pierce, female    DOB: 10-Aug-1950, 61 y.o.   MRN: [id number]  HPI Back pain- 'every day i wake up w/ pain in my back from the scoliosis'.  R thoracolumbar.  Having tightness between shoulder blades intermittently.  Some relief w/ Naproxen but this upsets stomach  abd pain- x3 days, described as a 'hunger pain'.  Occurred w/ NSAID use.  Improved w/ eating.  sxs have resolved since she stopped taking Naproxen.  No N/V/D.   Review of Systems For ROS see HPI     Objective:   Physical Exam  Vitals reviewed. Constitutional: She appears well-developed and well-nourished. No distress.  Cardiovascular: Normal rate, regular rhythm and normal heart sounds.   Pulmonary/Chest: Effort normal and breath sounds normal. No respiratory distress. She has no wheezes. She has no rales.  Abdominal: Soft. Bowel sounds are normal. She exhibits no distension. There is no tenderness. There is no rebound and no guarding.  Musculoskeletal:       Lumbar back: She exhibits deformity (thoracolumbar curve to R), pain and spasm.       Back:  Skin: Skin is warm and dry.  Psychiatric: She has a normal mood and affect. Her behavior is normal.          Assessment & Plan:

## 2012-02-17 NOTE — Assessment & Plan Note (Signed)
Recurrent problem but steadily worsening.  Will refer to ortho for evaluation and management as pt's pain stems from scoliotic deformity.  Start Celebrex daily.  Pt expressed understanding and is in agreement w/ plan.

## 2012-02-17 NOTE — Assessment & Plan Note (Signed)
Chronic problem but now constantly painful.  Stop Naproxen.  Switch to celebrex.  Refer to ortho.  Pt expressed understanding and is in agreement w/ plan.

## 2012-02-17 NOTE — Assessment & Plan Note (Signed)
New.  Most likely due to NSAID use.  Pt unable to tolerate.  Switch to Celebrex.  Restart PPI.  Reviewed supportive care and red flags that should prompt return.  Pt expressed understanding and is in agreement w/ plan.

## 2012-02-17 NOTE — Patient Instructions (Addendum)
We'll call you with your back appt Start the Celebrex daily- take w/ food Continue the omeprazole for the abdominal pain Tylenol as needed for additional pain Call with any questions or concerns Hang in there!

## 2012-02-19 ENCOUNTER — Telehealth: Payer: Self-pay | Admitting: Family Medicine

## 2012-02-19 NOTE — Telephone Encounter (Signed)
error 

## 2012-05-20 ENCOUNTER — Encounter: Payer: Self-pay | Admitting: Lab

## 2012-05-21 ENCOUNTER — Ambulatory Visit: Payer: BC Managed Care – PPO | Admitting: Family Medicine

## 2012-05-25 ENCOUNTER — Telehealth: Payer: Self-pay | Admitting: Family Medicine

## 2012-05-25 ENCOUNTER — Encounter: Payer: Self-pay | Admitting: *Deleted

## 2012-05-25 NOTE — Telephone Encounter (Signed)
Caller: Janet Pierce/Patient; Phone: [phone]; Reason for Call: Patient is calling states that she had a letter submitted from the provider in regards to wearing a certain type of shoe while working.  Patient states that her work is requesting a renewal of that letter.  Patient needs letter to state that [name] needs to wear athletic shoes due to health concerns and standing on feet all day.  Patient is requesting that letter be faxed when completed.  Please fax letter to [phone] which is the same as the home number.    OFFICE NOTE: PLEASE FOLLOW UP ON PATIENT REQUEST.

## 2012-05-25 NOTE — Telephone Encounter (Signed)
Please copy and paste info from previous letter into new letter w/ current date.  Thank you.

## 2012-05-26 NOTE — Telephone Encounter (Signed)
Letter done and faxed to the pt.//AB/CMA

## 2012-06-17 ENCOUNTER — Other Ambulatory Visit: Payer: Self-pay | Admitting: Family Medicine

## 2012-06-17 DIAGNOSIS — I10 Essential (primary) hypertension: Secondary | ICD-10-CM

## 2012-06-17 DIAGNOSIS — E785 Hyperlipidemia, unspecified: Secondary | ICD-10-CM

## 2012-06-17 NOTE — Telephone Encounter (Signed)
LM @ (2:38pm) asking the pt to RTC.//AB/CMA

## 2012-06-17 NOTE — Telephone Encounter (Signed)
Refills for Norvasc and Simvastatin sent to [facility]

## 2012-07-14 ENCOUNTER — Telehealth: Payer: Self-pay | Admitting: Family Medicine

## 2012-07-14 NOTE — Telephone Encounter (Signed)
Patient is requesting a chiropractic referral for her back/shoulder problems. Please advise.

## 2012-07-14 NOTE — Telephone Encounter (Signed)
We don't make chiropractic referrals but I prefer they see [facility].  Please provide # and pt can schedule appt

## 2012-07-14 NOTE — Telephone Encounter (Signed)
Please advise     KP//CMA 

## 2012-07-17 ENCOUNTER — Encounter (HOSPITAL_COMMUNITY): Payer: Self-pay | Admitting: Emergency Medicine

## 2012-07-17 ENCOUNTER — Emergency Department (HOSPITAL_COMMUNITY)
Admission: EM | Admit: 2012-07-17 | Discharge: 2012-07-17 | Disposition: A | Discharge status: Home / Self Care | Payer: BC Managed Care – PPO | Attending: Emergency Medicine | Admitting: Emergency Medicine

## 2012-07-17 ENCOUNTER — Emergency Department (HOSPITAL_COMMUNITY): Payer: BC Managed Care – PPO

## 2012-07-17 DIAGNOSIS — M1990 Unspecified osteoarthritis, unspecified site: Secondary | ICD-10-CM

## 2012-07-17 DIAGNOSIS — Z8669 Personal history of other diseases of the nervous system and sense organs: Secondary | ICD-10-CM | POA: Insufficient documentation

## 2012-07-17 DIAGNOSIS — R52 Pain, unspecified: Secondary | ICD-10-CM | POA: Insufficient documentation

## 2012-07-17 DIAGNOSIS — Z87891 Personal history of nicotine dependence: Secondary | ICD-10-CM | POA: Insufficient documentation

## 2012-07-17 DIAGNOSIS — M898X5 Other specified disorders of bone, thigh: Secondary | ICD-10-CM

## 2012-07-17 DIAGNOSIS — M4120 Other idiopathic scoliosis, site unspecified: Secondary | ICD-10-CM | POA: Insufficient documentation

## 2012-07-17 DIAGNOSIS — M25519 Pain in unspecified shoulder: Secondary | ICD-10-CM | POA: Insufficient documentation

## 2012-07-17 DIAGNOSIS — M25552 Pain in left hip: Secondary | ICD-10-CM

## 2012-07-17 DIAGNOSIS — M129 Arthropathy, unspecified: Secondary | ICD-10-CM | POA: Insufficient documentation

## 2012-07-17 DIAGNOSIS — M899 Disorder of bone, unspecified: Secondary | ICD-10-CM | POA: Insufficient documentation

## 2012-07-17 DIAGNOSIS — M545 Low back pain, unspecified: Secondary | ICD-10-CM | POA: Insufficient documentation

## 2012-07-17 DIAGNOSIS — E785 Hyperlipidemia, unspecified: Secondary | ICD-10-CM | POA: Insufficient documentation

## 2012-07-17 DIAGNOSIS — Z79899 Other long term (current) drug therapy: Secondary | ICD-10-CM | POA: Insufficient documentation

## 2012-07-17 DIAGNOSIS — M25559 Pain in unspecified hip: Secondary | ICD-10-CM | POA: Insufficient documentation

## 2012-07-17 DIAGNOSIS — M419 Scoliosis, unspecified: Secondary | ICD-10-CM

## 2012-07-17 DIAGNOSIS — M949 Disorder of cartilage, unspecified: Secondary | ICD-10-CM | POA: Insufficient documentation

## 2012-07-17 DIAGNOSIS — M25511 Pain in right shoulder: Secondary | ICD-10-CM

## 2012-07-17 DIAGNOSIS — I10 Essential (primary) hypertension: Secondary | ICD-10-CM | POA: Insufficient documentation

## 2012-07-17 MED ORDER — HYDROCODONE-ACETAMINOPHEN 5-325 MG PO TABS: 1.0000 | ORAL_TABLET | Freq: Three times a day (TID) | ORAL | Status: DC | PRN

## 2012-07-17 NOTE — ED Notes (Signed)
Pt reports recurrent muscle pain x 1 month. Pain in low back and extremities. Has not seen PCP. Denies injury

## 2012-07-17 NOTE — ED Provider Notes (Signed)
History    This chart was scribed for non-physician practitioner, Marlon Pel PA-C working with Rolan Bucco, MD by Smitty Pluck, ED scribe. This patient was seen in room WTR5/WTR5 and the patient's care was started at 4:38 PM.   CSN: [id number]  Arrival date & time 07/17/12  1535      Chief Complaint  Patient presents with  . Leg Pain  . Arm Pain  . Back Pain  . Generalized Body Aches    pain in extremeties x i month    The history is provided by the patient and medical records. No language interpreter was used.   Janet Pierce is a 62 y.o. female with hx of generalized body pain and scoliosis who presents to the Emergency Department complaining of intermittent, moderate right upper back pain near right shoulder onset 1 day ago. She reports that she does not currently have pain. Was seen 1 year ago for generalized body ache symptoms and states that the generalized back pain, leg pain, and arm pain have been ongoing. She states she has used a heating pad and tylenol without relief. Pt reports that she use to stand on her feet daily at work but she has retired. Pt states that her PCP was contact 3 days ago for chiropractor referral for shoulder and arm pain but she has not scheduled an appointment. She states that she has orthopedic appointment within the next month. She used to take naproxen and was prescribed celebrex but has not taken any celebrex.  Pt denies fever, chills, nausea, vomiting, diarrhea, weakness, cough, SOB and any other pain.     Past Medical History  Diagnosis Date  . Retinal detachment     left eye prosthesis  . Scoliosis   . Hypertension   . Glaucoma(365)   . Hyperlipidemia     Past Surgical History  Procedure Laterality Date  . Retinal detachment surgery    . Lipoma excision    . Tonsillectomy      Family History  Problem Relation Age of Onset  . Lung cancer Mother   . Hypertension Mother   . Diabetes Maternal Grandmother     History   Substance Use Topics  . Smoking status: Former Smoker -- 0.25 packs/day  . Smokeless tobacco: Never Used  . Alcohol Use: Yes    OB History   Grav Para Term Preterm Abortions TAB SAB Ect Mult Living   2 2             Obstetric Comments   Daughters in [place], [place]      Review of Systems  Constitutional: Negative for fever and chills.  Respiratory: Negative for shortness of breath.   Gastrointestinal: Negative for nausea and vomiting.  Musculoskeletal: Positive for back pain and arthralgias.  Neurological: Negative for weakness.  All other systems reviewed and are negative.    Allergies  Review of patient's allergies indicates no known allergies.  Home Medications   Current Outpatient Rx  Name  Route  Sig  Dispense  Refill  . acetaminophen (TYLENOL) 500 MG tablet   Oral   Take 1,000 mg by mouth every 6 (six) hours as needed for pain.         Marland Kitchen amLODipine (NORVASC) 10 MG tablet   Oral   Take 10 mg by mouth daily before breakfast.         . cyclobenzaprine (FLEXERIL) 10 MG tablet   Oral   Take 10 mg by mouth 3 (three) times daily as  needed for muscle spasms.         . lansoprazole (PREVACID) 30 MG capsule   Oral   Take 30 mg by mouth daily.         Marland Kitchen latanoprost (XALATAN) 0.005 % ophthalmic solution   Both Eyes   Place 1 drop into both eyes at bedtime.          . Multiple Vitamin (MULTIVITAMIN WITH MINERALS) TABS   Oral   Take 1 tablet by mouth daily.         . simvastatin (ZOCOR) 20 MG tablet   Oral   Take 20 mg by mouth daily before breakfast.         . timolol (BETIMOL) 0.25 % ophthalmic solution   Both Eyes   Place 1 drop into both eyes daily.           . vitamin B-12 (CYANOCOBALAMIN) 100 MCG tablet   Oral   Take 50 mcg by mouth daily.         Marland Kitchen HYDROcodone-acetaminophen (NORCO/VICODIN) 5-325 MG per tablet   Oral   Take 1 tablet by mouth every 8 (eight) hours as needed for pain.   10 tablet   0     BP 118/71  Pulse 90   Temp(Src) 98.3 F (36.8 C) (Oral)  SpO2 93%  Physical Exam  Nursing note and vitals reviewed. Constitutional: She is oriented to person, place, and time. She appears well-developed and well-nourished. No distress.  HENT:  Head: Normocephalic and atraumatic.  Eyes: EOM are normal.  Neck: Neck supple. No tracheal deviation present.  Cardiovascular: Normal rate.   Pulmonary/Chest: Effort normal. No respiratory distress.  Musculoskeletal:       Right shoulder: Normal.       Right hip: She exhibits decreased range of motion and decreased strength. She exhibits no tenderness, no bony tenderness, no swelling, no crepitus, no deformity and no laceration.       Left hip: She exhibits decreased range of motion, tenderness and bony tenderness. She exhibits normal strength, no swelling, no crepitus, no deformity and no laceration.       Lumbar back: She exhibits decreased range of motion, tenderness, pain and spasm.  Neurological: She is alert and oriented to person, place, and time.  Skin: Skin is warm and dry.  Psychiatric: She has a normal mood and affect. Her behavior is normal.    ED Course  Procedures (including critical care time) DIAGNOSTIC STUDIES: Oxygen Saturation is 93% on room air, low by my interpretation.    COORDINATION OF CARE: 4:43 PM Discussed ED treatment with pt and pt agrees to chest xray.     Labs Reviewed - No data to display Dg Shoulder Right  07/17/2012  *RADIOLOGY REPORT*  Clinical Data: Right scapular pain.  No known injury.  RIGHT SHOULDER - 2+ VIEW  Comparison: None.  Findings: Normal appearing bones and soft tissues.  No scapular abnormality seen.  IMPRESSION: Normal examination.   Original Report Authenticated By: Beckie Salts, M.D.    Dg Hip Complete Left  07/17/2012  *RADIOLOGY REPORT*  Clinical Data: Left hip pain and sciatica.  No known injury.  LEFT HIP - COMPLETE 2+ VIEW  Comparison: None.  Findings: 3.0 x 2.1 cm lucent area with a mildly thickened  surrounding sclerotic rim is demonstrated in the right superior acetabulum.  Mild left femoral head and neck junction spur formation.  Lower lumbar spine degenerative changes and scoliosis.  IMPRESSION:  1.  Mild left hip  degenerative changes. 2.  Lower lumbar spine degenerative changes and scoliosis. 3.  3.0 cm lesion with nonaggressive features in the right superior acetabulum.   Original Report Authenticated By: Beckie Salts, M.D.      1. Arthritis   2. Scoliosis   3. Lytic bone lesion of hip   4. Right shoulder pain   5. Left hip pain   6. Low back pain       MDM  Pt has hx of bone lesions that need to be removed and are benign. She knew about the one on her hip already. Has an appointment with Dr. Cleophas Dunker set up for the end of April for her muscle/joint pains. Her PCP has been treating her generalized pains for over a year, her previous notes reviewed. Pt not taking Celabrex. Says she never started because she didn't know what it was. No concerning findings on physical exam. Shoulder pain has actually resolved in the last few days but had been persistent for months.  Pt has been advised of the symptoms that warrant their return to the ED. Patient has voiced understanding and has agreed to follow-up with the PCP or specialist.  I personally performed the services described in this documentation, which was scribed in my presence. The recorded information has been reviewed and is accurate.      Dorthula Matas, PA-C 07/17/12 1813

## 2012-07-17 NOTE — ED Provider Notes (Signed)
Medical screening examination/treatment/procedure(s) were performed by non-physician practitioner and as supervising physician I was immediately available for consultation/collaboration.   Lorianna Spadaccini, MD 07/17/12 2240 

## 2012-07-20 NOTE — Telephone Encounter (Signed)
Lm @ (2:48pm) asking the pt to RTC.//AB/CMA

## 2012-07-22 NOTE — Telephone Encounter (Signed)
Spoke with patient, patient was given the number to [facility]

## 2012-08-12 ENCOUNTER — Ambulatory Visit (INDEPENDENT_AMBULATORY_CARE_PROVIDER_SITE_OTHER): Payer: BC Managed Care – PPO | Admitting: Family Medicine

## 2012-08-12 ENCOUNTER — Encounter: Payer: Self-pay | Admitting: Family Medicine

## 2012-08-12 ENCOUNTER — Other Ambulatory Visit: Payer: Self-pay | Admitting: Family Medicine

## 2012-08-12 VITALS — BP 118/70 | HR 85 | Temp 99.1°F | Wt 136.0 lb

## 2012-08-12 DIAGNOSIS — M4120 Other idiopathic scoliosis, site unspecified: Secondary | ICD-10-CM

## 2012-08-12 DIAGNOSIS — M419 Scoliosis, unspecified: Secondary | ICD-10-CM

## 2012-08-12 DIAGNOSIS — R079 Chest pain, unspecified: Secondary | ICD-10-CM

## 2012-08-12 DIAGNOSIS — Z8739 Personal history of other diseases of the musculoskeletal system and connective tissue: Secondary | ICD-10-CM

## 2012-08-12 MED ORDER — MELOXICAM 15 MG PO TABS: ORAL_TABLET | ORAL | Status: DC

## 2012-08-12 NOTE — Assessment & Plan Note (Signed)
Check xrays  Suspect worsening scoliosis ekg--no acute changes

## 2012-08-12 NOTE — Progress Notes (Signed)
  Subjective:    Patient ID: Janet Pierce, female    DOB: [birth year], 62 y.o.   MRN: [id number]  HPI Pt here c/o inc back pain and chest pain.  Pt does have a hx scoliosis and has app with ortho.  No other complaints.    Review of Systems As above    Objective:   Physical Exam  BP 118/70  Pulse 85  Temp(Src) 99.1 F (37.3 C) (Oral)  Wt 136 lb (61.689 kg)  BMI 23.89 kg/m2  SpO2 97% General appearance: alert, cooperative, appears stated age and no distress Lungs: clear to auscultation bilaterally Heart: S1, S2 normal Abdomen: soft, non-tender; bowel sounds normal; no masses,  no organomegaly      Assessment & Plan:

## 2012-08-12 NOTE — Patient Instructions (Signed)
Scoliosis Scoliosis is the name given to a spine that curves sideways. It is a common condition found in up to ten percent of adolescents. It is more common in teenage girls. This is sometimes the result of other underlying problems such as unequal leg length or muscular problems. Approximately 70% of the time the cause unknown. It can cause twisting of the shoulders, hips, chest, back, and rib cage. Exercises generally do not affect the course of this disease, but may be helpful in strengthening weak muscle groups. Orthopedic braces may be needed during growth spurts. Surgery may be necessary for progressive cases. HOME CARE INSTRUCTIONS   Your caregiver may suggest exercises to strengthen your muscles. Follow their instructions. Ask your caregiver if you can participate in sports activities.  Bracing may be needed to try to limit the progression of the spinal curve. Wear the brace as instructed by your caregiver.  Follow-up appointments are important. Often mild cases of scoliosis can be kept track of by regular physical exams. However, periodic x-rays may be taken in more severe cases to follow the progress of the curvature, especially with brace treatment. Scoliosis can be corrected or improved if treated early. SEEK IMMEDIATE MEDICAL CARE IF:  You have back pain that is not relieved by medications prescribed by your caregiver.  If there is weakness or increased muscle tone (spasticity) in your legs or any loss of bowel or bladder control. Document Released: 04/04/2000 Document Revised: 06/30/2011 Document Reviewed: 04/24/2008 [facility] Patient Information 2013 [place], [place].

## 2012-08-13 ENCOUNTER — Telehealth: Payer: Self-pay | Admitting: *Deleted

## 2012-08-13 ENCOUNTER — Ambulatory Visit (INDEPENDENT_AMBULATORY_CARE_PROVIDER_SITE_OTHER)
Admission: RE | Admit: 2012-08-13 | Discharge: 2012-08-13 | Disposition: A | Discharge status: Home / Self Care | Payer: BC Managed Care – PPO | Source: Ambulatory Visit | Attending: Family Medicine | Admitting: Family Medicine

## 2012-08-13 DIAGNOSIS — Z8739 Personal history of other diseases of the musculoskeletal system and connective tissue: Secondary | ICD-10-CM

## 2012-08-13 MED ORDER — MELOXICAM 15 MG PO TABS: ORAL_TABLET | ORAL | Status: DC

## 2012-08-13 NOTE — Telephone Encounter (Signed)
Pt called back regarding med refill request.  Informed the pt that we received refill request for Naproxen 500mg , and we needed to know if she was back to taking it.  Pt stated that she saw Dr. Laury Axon on yest and she thought that is what Dr. Laury Axon had called in for her.  She stated that she went to the pharmacy but they did not have anything for her.  Informed her that Dr. Laury Axon called in Mobic 15mg  not the Naproxen.  Informed her that we mistakably sent the rx to [facility], and they shipped it out today so she will be getting it on Monday.  Pt stated that she did not want to wait until Monday, so if we could sent the rx to [facility] [address]  Informed her that I would be glad to sent it to the locator pharmacy, but she will have 2 rx's for the Mobic.  And if she do not want to keep the rx she receives from [facility] she can call them and they will let her know what pharmacy she can take it to, and they will dispose of it for her.  Pt agreed.  Informed the pt I will sent a new rx to [facility].  New rx sent to pharmacy by [facility]script.//AB/CMA

## 2012-09-20 ENCOUNTER — Encounter (HOSPITAL_COMMUNITY): Payer: Self-pay | Admitting: Emergency Medicine

## 2012-09-20 ENCOUNTER — Emergency Department (HOSPITAL_COMMUNITY)
Admission: EM | Admit: 2012-09-20 | Discharge: 2012-09-20 | Disposition: A | Discharge status: Home / Self Care | Payer: BC Managed Care – PPO | Attending: Emergency Medicine | Admitting: Emergency Medicine

## 2012-09-20 ENCOUNTER — Emergency Department (HOSPITAL_COMMUNITY): Payer: BC Managed Care – PPO

## 2012-09-20 DIAGNOSIS — Z79899 Other long term (current) drug therapy: Secondary | ICD-10-CM | POA: Insufficient documentation

## 2012-09-20 DIAGNOSIS — R0789 Other chest pain: Secondary | ICD-10-CM

## 2012-09-20 DIAGNOSIS — E785 Hyperlipidemia, unspecified: Secondary | ICD-10-CM | POA: Insufficient documentation

## 2012-09-20 DIAGNOSIS — I10 Essential (primary) hypertension: Secondary | ICD-10-CM | POA: Insufficient documentation

## 2012-09-20 DIAGNOSIS — Z8669 Personal history of other diseases of the nervous system and sense organs: Secondary | ICD-10-CM | POA: Insufficient documentation

## 2012-09-20 DIAGNOSIS — H409 Unspecified glaucoma: Secondary | ICD-10-CM | POA: Insufficient documentation

## 2012-09-20 DIAGNOSIS — Z970 Presence of artificial eye: Secondary | ICD-10-CM | POA: Insufficient documentation

## 2012-09-20 DIAGNOSIS — M4120 Other idiopathic scoliosis, site unspecified: Secondary | ICD-10-CM | POA: Insufficient documentation

## 2012-09-20 LAB — CBC
MCH: 28.4 pg (ref 26.0–34.0)
MCHC: 34.5 g/dL (ref 30.0–36.0)
Platelets: 315 10*3/uL (ref 150–400)
RBC: 4.3 MIL/uL (ref 3.87–5.11)
RDW: 12.7 % (ref 11.5–15.5)

## 2012-09-20 LAB — POCT I-STAT TROPONIN I: Troponin i, poc: 0 ng/mL (ref 0.00–0.08)

## 2012-09-20 LAB — BASIC METABOLIC PANEL
Calcium: 10.1 mg/dL (ref 8.4–10.5)
Creatinine, Ser: 0.65 mg/dL (ref 0.50–1.10)
GFR calc Af Amer: >90 mL/min (ref >90)
GFR calc non Af Amer: >90 mL/min (ref >90)
Sodium: 139 mEq/L (ref 135–145)

## 2012-09-20 MED ORDER — ALBUTEROL SULFATE HFA 108 (90 BASE) MCG/ACT IN AERS
2.0000 | INHALATION_SPRAY | RESPIRATORY_TRACT | Status: DC | PRN
  Administered 2012-09-20: 2 via RESPIRATORY_TRACT
  Filled 2012-09-20: qty 6.7

## 2012-09-20 NOTE — ED Notes (Signed)
Pt presenting to ed with c/o being on nicorette gum 2mg  x 6 weeks and started back smoking last Thursday and today she felt tired and had some pain in her right arm and generalized chest pain pt denies chest pain at this time. Pt states "I know that it's from smoking"

## 2012-09-20 NOTE — ED Notes (Signed)
Pt states stopped smoking for 6 weeks and then Thursday started smoking again, states was taking nicorette, has smoked 2 packs of cigarettes since Thursday, states started having jaw, L arm and central chest pain, states did have tightness in chest but now denies pain or tightness, denies jaw or arm pain, denies SOB or n/v/d. Pt states she just feels like something is stuck in throat.

## 2012-09-20 NOTE — ED Provider Notes (Signed)
History     CSN: [id number]  Arrival date & time 09/20/12  4098   First MD Initiated Contact with Patient 09/20/12 2101      Chief Complaint  Patient presents with  . Chest Pain    (Consider location/radiation/quality/duration/timing/severity/associated sxs/prior treatment) Patient is a 62 y.o. female presenting with chest pain. The history is provided by the patient. No language interpreter was used.  Chest Pain Pain location:  L chest and R chest Pain quality: tightness   Associated symptoms: no abdominal pain, no cough, no fever, no nausea, no shortness of breath and not vomiting   Associated symptoms comment:  She quit smoking 2 months ago and then 4-5 days ago started back smoking a pack every 2 days while also using Nicorette.  She feels that this is the cause of her symptoms of chest tightness, which is bilateral. There is no increase to tightness with breathing, no SOB, nausea. She has left arm aching but reports specifically that she has this same left arm aching without any differences "all the time". No fever or cough.    Past Medical History  Diagnosis Date  . Retinal detachment     left eye prosthesis  . Scoliosis   . Hypertension   . Glaucoma   . Hyperlipidemia     Past Surgical History  Procedure Laterality Date  . Retinal detachment surgery    . Lipoma excision    . Tonsillectomy      Family History  Problem Relation Age of Onset  . Lung cancer Mother   . Hypertension Mother   . Diabetes Maternal Grandmother     History  Substance Use Topics  . Smoking status: Former Smoker -- 0.25 packs/day    Types: Cigarettes    Quit date: 06/22/2012  . Smokeless tobacco: Never Used     Comment: using nicorette gum  . Alcohol Use: Yes    OB History   Grav Para Term Preterm Abortions TAB SAB Ect Mult Living   2 2             Obstetric Comments   Daughters in [place], [place]      Review of Systems  Constitutional: Negative for fever.  Respiratory:  Negative for cough and shortness of breath.   Cardiovascular: Positive for chest pain.  Gastrointestinal: Negative for nausea, vomiting and abdominal pain.  Musculoskeletal:       See HPI.    Allergies  Review of patient's allergies indicates no known allergies.  Home Medications   Current Outpatient Rx  Name  Route  Sig  Dispense  Refill  . acetaminophen (TYLENOL) 500 MG tablet   Oral   Take 1,000 mg by mouth every 6 (six) hours as needed for pain.         Marland Kitchen amLODipine (NORVASC) 10 MG tablet   Oral   Take 10 mg by mouth daily before breakfast.         . latanoprost (XALATAN) 0.005 % ophthalmic solution   Both Eyes   Place 1 drop into both eyes at bedtime.          . Multiple Vitamin (MULTIVITAMIN WITH MINERALS) TABS   Oral   Take 1 tablet by mouth daily.         . simvastatin (ZOCOR) 20 MG tablet   Oral   Take 20 mg by mouth daily before breakfast.         . timolol (BETIMOL) 0.25 % ophthalmic solution   Both Eyes  Place 1 drop into both eyes daily.           . vitamin B-12 (CYANOCOBALAMIN) 100 MCG tablet   Oral   Take 50 mcg by mouth daily.           BP 141/83  Pulse 103  Temp(Src) 98.8 F (37.1 C) (Oral)  Resp 16  SpO2 100%  Physical Exam  Constitutional: She is oriented to person, place, and time. She appears well-developed and well-nourished.  HENT:  Head: Normocephalic.  Neck: Normal range of motion. Neck supple.  Cardiovascular: Normal rate and regular rhythm.   Pulmonary/Chest: Effort normal and breath sounds normal.  Abdominal: Soft. Bowel sounds are normal. There is no tenderness. There is no rebound and no guarding.  Musculoskeletal: Normal range of motion.  Neurological: She is alert and oriented to person, place, and time.  Skin: Skin is warm and dry. No rash noted.  Psychiatric: She has a normal mood and affect.    ED Course  Procedures (including critical care time)  Labs Reviewed  CBC - Abnormal; Notable for the  following:    HCT 35.4 (*)    All other components within normal limits  BASIC METABOLIC PANEL - Abnormal; Notable for the following:    Potassium 3.2 (*)    All other components within normal limits  POCT I-STAT TROPONIN I   Results for orders placed during the hospital encounter of 09/20/12  CBC      Result Value Range   WBC 4.7  4.0 - 10.5 K/uL   RBC 4.30  3.87 - 5.11 MIL/uL   Hemoglobin 12.2  12.0 - 15.0 g/dL   HCT 16.1 (*) 09.6 - 04.5 %   MCV 82.3  78.0 - 100.0 fL   MCH 28.4  26.0 - 34.0 pg   MCHC 34.5  30.0 - 36.0 g/dL   RDW 40.9  81.1 - 91.4 %   Platelets 315  150 - 400 K/uL  BASIC METABOLIC PANEL      Result Value Range   Sodium 139  135 - 145 mEq/L   Potassium 3.2 (*) 3.5 - 5.1 mEq/L   Chloride 100  96 - 112 mEq/L   CO2 29  19 - 32 mEq/L   Glucose, Bld 94  70 - 99 mg/dL   BUN 7  6 - 23 mg/dL   Creatinine, Ser 7.82  0.50 - 1.10 mg/dL   Calcium 95.6  8.4 - 21.3 mg/dL   GFR calc non Af Amer >90  >90 mL/min   GFR calc Af Amer >90  >90 mL/min  POCT I-STAT TROPONIN I      Result Value Range   Troponin i, poc 0.00  0.00 - 0.08 ng/mL   Comment 3           Dg Chest 2 View  09/20/2012   *RADIOLOGY REPORT*  Clinical Data: Chest pain.  CHEST - 2 VIEW  Comparison: Chest x-ray 04/18/2007.  Findings: Lung volumes are normal.  No consolidative airspace disease.  No pleural effusions.  No pneumothorax.  No pulmonary nodule or mass noted.  Pulmonary vasculature and the cardiomediastinal silhouette are within normal limits. Atherosclerosis in the thoracic aorta.  Severe S-shaped thoracolumbar scoliosis, convex to the right in the thoracic region and to the left near the thoracolumbar junction.  IMPRESSION: 1. No radiographic evidence of acute cardiopulmonary disease. 2.  Atherosclerosis.   Original Report Authenticated By: Trudie Reed, M.D.    No results found.   No diagnosis  found.  1. Chest pain  MDM  All cardiac labs are negative. Symptoms are atypical for ACS. She feels  her chest tightness is improved with inhaler use. She is stable for discharge.        Arnoldo Hooker, PA-C 09/20/12 2238

## 2012-09-21 NOTE — ED Provider Notes (Signed)
Medical screening examination/treatment/procedure(s) were performed by non-physician practitioner and as supervising physician I was immediately available for consultation/collaboration.  Flint Melter, MD 09/21/12 [phone]

## 2013-01-05 ENCOUNTER — Other Ambulatory Visit: Payer: Self-pay

## 2013-01-05 DIAGNOSIS — Z1231 Encounter for screening mammogram for malignant neoplasm of breast: Secondary | ICD-10-CM

## 2013-01-14 ENCOUNTER — Other Ambulatory Visit: Payer: Self-pay | Admitting: General Practice

## 2013-01-14 MED ORDER — AMLODIPINE BESYLATE 10 MG PO TABS: 10.0000 mg | ORAL_TABLET | Freq: Every day | ORAL | Status: DC

## 2013-01-15 ENCOUNTER — Emergency Department (HOSPITAL_COMMUNITY)
Admission: EM | Admit: 2013-01-15 | Discharge: 2013-01-15 | Disposition: A | Discharge status: Home / Self Care | Payer: BC Managed Care – PPO | Attending: Emergency Medicine | Admitting: Emergency Medicine

## 2013-01-15 ENCOUNTER — Encounter (HOSPITAL_COMMUNITY): Payer: Self-pay | Admitting: Emergency Medicine

## 2013-01-15 DIAGNOSIS — Z79899 Other long term (current) drug therapy: Secondary | ICD-10-CM | POA: Insufficient documentation

## 2013-01-15 DIAGNOSIS — Z8739 Personal history of other diseases of the musculoskeletal system and connective tissue: Secondary | ICD-10-CM | POA: Insufficient documentation

## 2013-01-15 DIAGNOSIS — Z8669 Personal history of other diseases of the nervous system and sense organs: Secondary | ICD-10-CM | POA: Insufficient documentation

## 2013-01-15 DIAGNOSIS — I10 Essential (primary) hypertension: Secondary | ICD-10-CM | POA: Insufficient documentation

## 2013-01-15 DIAGNOSIS — E785 Hyperlipidemia, unspecified: Secondary | ICD-10-CM | POA: Insufficient documentation

## 2013-01-15 DIAGNOSIS — Z87891 Personal history of nicotine dependence: Secondary | ICD-10-CM | POA: Insufficient documentation

## 2013-01-15 DIAGNOSIS — M79609 Pain in unspecified limb: Secondary | ICD-10-CM | POA: Insufficient documentation

## 2013-01-15 DIAGNOSIS — M79602 Pain in left arm: Secondary | ICD-10-CM

## 2013-01-15 LAB — BASIC METABOLIC PANEL
BUN: 10 mg/dL (ref 6–23)
Chloride: 100 mEq/L (ref 96–112)
Creatinine, Ser: 0.61 mg/dL (ref 0.50–1.10)
GFR calc Af Amer: >90 mL/min (ref >90)
GFR calc non Af Amer: >90 mL/min (ref >90)
Potassium: 3.2 mEq/L — ABNORMAL LOW (ref 3.5–5.1)

## 2013-01-15 LAB — CBC WITH DIFFERENTIAL/PLATELET
Basophils Absolute: 0 10*3/uL (ref 0.0–0.1)
Basophils Relative: 1 % (ref 0–1)
Eosinophils Absolute: 0.1 10*3/uL (ref 0.0–0.7)
MCH: 28.2 pg (ref 26.0–34.0)
MCHC: 34.3 g/dL (ref 30.0–36.0)
Neutro Abs: 1.7 10*3/uL (ref 1.7–7.7)
Neutrophils Relative %: 41 % — ABNORMAL LOW (ref 43–77)
RDW: 13 % (ref 11.5–15.5)

## 2013-01-15 LAB — TROPONIN I: Troponin I: <0.3 ng/mL (ref <0.30)

## 2013-01-15 MED ORDER — MELOXICAM 7.5 MG PO TABS: 15.0000 mg | ORAL_TABLET | Freq: Every day | ORAL | Status: DC

## 2013-01-15 MED ORDER — KETOROLAC TROMETHAMINE 30 MG/ML IJ SOLN
30.0000 mg | Freq: Once | INTRAMUSCULAR | Status: AC
  Administered 2013-01-15: 30 mg via INTRAVENOUS
  Filled 2013-01-15: qty 1

## 2013-01-15 NOTE — ED Provider Notes (Signed)
CSN: [id number]     Arrival date & time 01/15/13  0218 History   First MD Initiated Contact with Patient 01/15/13 0316     Chief Complaint  Patient presents with  . Chest Pain   (Consider location/radiation/quality/duration/timing/severity/associated sxs/prior Treatment) HPI 62 year old female presents to the emergency department with complaint of left arm pain.  Pain is squeezing burning in nature.  It starts in her left upper arm/elbow region, and radiates up into her shoulder.  Patient became a little nauseated and short of breath with the pain.  She became concerned about referred pain from a heart attack, and decided to come to the emergency department.  Symptoms started around 12:30 tonight.  Patient recently has had a exacerbation of her glaucoma, was started on steroid eyedrops.  She was concerned that the steroids may be affecting her body systemically.  Patient reports she's had this left arm pain on and off for some time, but much worse tonight.  No chest pain.  No shortness of breath or nausea currently.  She does have some left hip pain.  Patient has been closely followed by Dr. Vonna Kotyk with ophthalmology for her glaucoma.  She has past history of hypertension. Past Medical History  Diagnosis Date  . Retinal detachment     left eye prosthesis  . Scoliosis   . Hypertension   . Glaucoma   . Hyperlipidemia    Past Surgical History  Procedure Laterality Date  . Retinal detachment surgery    . Lipoma excision    . Tonsillectomy     Family History  Problem Relation Age of Onset  . Lung cancer Mother   . Hypertension Mother   . Diabetes Maternal Grandmother    History  Substance Use Topics  . Smoking status: Former Smoker -- 0.25 packs/day    Types: Cigarettes    Quit date: 06/22/2012  . Smokeless tobacco: Never Used     Comment: using nicorette gum  . Alcohol Use: Yes     Comment: occasionally   OB History   Grav Para Term Preterm Abortions TAB SAB Ect Mult Living   2  2             Obstetric Comments   Daughters in [place], [place]     Review of Systems  See History of Present Illness; otherwise all other systems are reviewed and negative  Allergies  Review of patient's allergies indicates no known allergies.  Home Medications   Current Outpatient Rx  Name  Route  Sig  Dispense  Refill  . acetaminophen (TYLENOL) 500 MG tablet   Oral   Take 1,000 mg by mouth every 6 (six) hours as needed for pain.         Marland Kitchen amLODipine (NORVASC) 10 MG tablet   Oral   Take 1 tablet (10 mg total) by mouth daily before breakfast.   15 tablet   0   . bimatoprost (LUMIGAN) 0.01 % SOLN   Right Eye   Place 1 drop into the right eye at bedtime.         . Multiple Vitamin (MULTIVITAMIN WITH MINERALS) TABS   Oral   Take 1 tablet by mouth every morning.          . simvastatin (ZOCOR) 20 MG tablet   Oral   Take 20 mg by mouth daily before breakfast.         . timolol (BETIMOL) 0.25 % ophthalmic solution   Right Eye   Place 1 drop  into the right eye 2 (two) times daily.          Marland Kitchen tobramycin-dexamethasone (TOBRADEX) ophthalmic solution   Both Eyes   Place 1 drop into both eyes 4 (four) times daily.         . vitamin B-12 (CYANOCOBALAMIN) 100 MCG tablet   Oral   Take 100 mcg by mouth every morning.           BP 126/74  Pulse 89  Temp(Src) 99 F (37.2 C) (Oral)  Resp 18  Ht 5\' 5"  (1.651 m)  Wt 137 lb (62.143 kg)  BMI 22.8 kg/m2  SpO2 97% Physical Exam  Nursing note and vitals reviewed. Constitutional: She is oriented to person, place, and time. She appears well-developed and well-nourished.  HENT:  Head: Normocephalic and atraumatic.  Right Ear: External ear normal.  Left Ear: External ear normal.  Nose: Nose normal.  Mouth/Throat: Oropharynx is clear and moist.  Eyes: Conjunctivae and EOM are normal. Pupils are equal, round, and reactive to light.  Neck: Normal range of motion. Neck supple. No JVD present. No tracheal deviation  present. No thyromegaly present.  Cardiovascular: Normal rate, regular rhythm, normal heart sounds and intact distal pulses.  Exam reveals no gallop and no friction rub.   No murmur heard. Pulmonary/Chest: Effort normal and breath sounds normal. No stridor. No respiratory distress. She has no wheezes. She has no rales. She exhibits no tenderness.  Abdominal: Soft. Bowel sounds are normal. She exhibits no distension and no mass. There is no tenderness. There is no rebound and no guarding.  Musculoskeletal: Normal range of motion. She exhibits tenderness (tenderness in left upper arm.  Unable to reproduce the pain with range of motion of shoulder or elbow.  No tenderness over medial or lateral humeral condyles.). She exhibits no edema.  Lymphadenopathy:    She has no cervical adenopathy.  Neurological: She is alert and oriented to person, place, and time. She exhibits normal muscle tone. Coordination normal.  Skin: Skin is warm and dry. No rash noted. No erythema. No pallor.  Psychiatric: She has a normal mood and affect. Her behavior is normal. Judgment and thought content normal.    ED Course  Procedures (including critical care time) Labs Review Labs Reviewed  CBC WITH DIFFERENTIAL - Abnormal; Notable for the following:    Neutrophils Relative % 41 (*)    All other components within normal limits  BASIC METABOLIC PANEL - Abnormal; Notable for the following:    Potassium 3.2 (*)    All other components within normal limits  TROPONIN I   Imaging Review No results found.   Date: 01/15/2013  Rate: 93  Rhythm: normal sinus rhythm  QRS Axis: normal  Intervals: QT prolonged  ST/T Wave abnormalities: normal  Conduction Disutrbances:none  Narrative Interpretation:   Old EKG Reviewed: unchanged    MDM   1. Left arm pain    62 year old female with left arm pain.  She has a nonischemic EKG.  She has had constant.  Pain for 3 hours upon arrival.  Her troponin is negative.  This does  not appear to be ACS or unstable angina.  Patient with complete resolution of pain after Toradol.  We'll plan to discharge with Mobic and have her followup with orthopedics./Physical therapy for further evaluation.    Olivia Mackie, MD 01/15/13 [phone]

## 2013-01-15 NOTE — ED Notes (Signed)
Pt states that she began having a squeezing pain in her left arm that radiated toward her chest tonight at 0030. States she was nauseous , lightheaded, and a little SOB. Pt states that her chest does not hurt but her left arm and leg hurt.

## 2013-01-21 ENCOUNTER — Ambulatory Visit
Admission: RE | Admit: 2013-01-21 | Discharge: 2013-01-21 | Disposition: A | Discharge status: Home / Self Care | Payer: BC Managed Care – PPO | Source: Ambulatory Visit

## 2013-01-21 DIAGNOSIS — Z1231 Encounter for screening mammogram for malignant neoplasm of breast: Secondary | ICD-10-CM

## 2013-01-27 ENCOUNTER — Ambulatory Visit (INDEPENDENT_AMBULATORY_CARE_PROVIDER_SITE_OTHER): Payer: BC Managed Care – PPO | Admitting: Family Medicine

## 2013-01-27 ENCOUNTER — Encounter: Payer: Self-pay | Admitting: Family Medicine

## 2013-01-27 VITALS — BP 128/80 | HR 100 | Temp 98.1°F | Resp 16 | Wt 137.0 lb

## 2013-01-27 DIAGNOSIS — I10 Essential (primary) hypertension: Secondary | ICD-10-CM

## 2013-01-27 DIAGNOSIS — E785 Hyperlipidemia, unspecified: Secondary | ICD-10-CM

## 2013-01-27 NOTE — Assessment & Plan Note (Addendum)
Mildly elevated at last check.  Due for labs.  Adjust meds prn

## 2013-01-27 NOTE — Progress Notes (Signed)
  Subjective:    Patient ID: Janet Pierce, female    DOB: May 19, 1950, 62 y.o.   MRN: [id number]  HPI HTN- chronic problem, well controlled on Norvasc.  Denies CP, SOB, HAs, visual changes, edema  Hyperlipidemia- chronic problem, on Simvastatin.  Denies abd pain, N/V, myalgias.   Review of Systems For ROS see HPI     Objective:   Physical Exam  Vitals reviewed. Constitutional: She is oriented to person, place, and time. She appears well-developed and well-nourished. No distress.  HENT:  Head: Normocephalic and atraumatic.  Eyes: Conjunctivae and EOM are normal. Pupils are equal, round, and reactive to light.  Neck: Normal range of motion. Neck supple. No thyromegaly present.  Cardiovascular: Normal rate, regular rhythm, normal heart sounds and intact distal pulses.   No murmur heard. Pulmonary/Chest: Effort normal and breath sounds normal. No respiratory distress.  Abdominal: Soft. She exhibits no distension. There is no tenderness.  Musculoskeletal: She exhibits no edema.  Lymphadenopathy:    She has no cervical adenopathy.  Neurological: She is alert and oriented to person, place, and time.  Skin: Skin is warm and dry.  Psychiatric: She has a normal mood and affect. Her behavior is normal.          Assessment & Plan:

## 2013-01-27 NOTE — Assessment & Plan Note (Signed)
Chronic problem.  Well controlled.  Asymptomatic.  Check labs.  No anticipated med changes. 

## 2013-01-27 NOTE — Patient Instructions (Signed)
Schedule your complete physical at your convenience We'll notify you of your lab results and make any changes if needed Keep up the good work!  You look great! Call with any questions or concerns Happy Fall!!!

## 2013-01-28 LAB — CBC WITH DIFFERENTIAL/PLATELET
Basophils Absolute: 0 10*3/uL (ref 0.0–0.1)
Eosinophils Absolute: 0.1 10*3/uL (ref 0.0–0.7)
Hemoglobin: 11.6 g/dL — ABNORMAL LOW (ref 12.0–15.0)
Lymphocytes Relative: 37.1 % (ref 12.0–46.0)
MCHC: 33.7 g/dL (ref 30.0–36.0)
Monocytes Relative: 6.6 % (ref 3.0–12.0)
Neutro Abs: 2.8 10*3/uL (ref 1.4–7.7)
Neutrophils Relative %: 54.2 % (ref 43.0–77.0)
RBC: 4.09 Mil/uL (ref 3.87–5.11)
RDW: 14.2 % (ref 11.5–14.6)

## 2013-01-28 LAB — HEPATIC FUNCTION PANEL
ALT: 16 U/L (ref 0–35)
AST: 22 U/L (ref 0–37)
Alkaline Phosphatase: 53 U/L (ref 39–117)
Bilirubin, Direct: 0.1 mg/dL (ref 0.0–0.3)
Total Bilirubin: 0.7 mg/dL (ref 0.3–1.2)
Total Protein: 7.2 g/dL (ref 6.0–8.3)

## 2013-01-28 LAB — BASIC METABOLIC PANEL
CO2: 30 mEq/L (ref 19–32)
Chloride: 101 mEq/L (ref 96–112)
Creatinine, Ser: 0.7 mg/dL (ref 0.4–1.2)
Potassium: 3.4 mEq/L — ABNORMAL LOW (ref 3.5–5.1)

## 2013-01-28 LAB — LIPID PANEL
Cholesterol: 201 mg/dL — ABNORMAL HIGH (ref 0–200)
Total CHOL/HDL Ratio: 4
VLDL: 9.2 mg/dL (ref 0.0–40.0)

## 2013-01-28 LAB — LDL CHOLESTEROL, DIRECT: Direct LDL: 134.8 mg/dL

## 2013-01-31 ENCOUNTER — Ambulatory Visit: Payer: BC Managed Care – PPO

## 2013-01-31 ENCOUNTER — Telehealth: Payer: Self-pay | Admitting: Family Medicine

## 2013-01-31 ENCOUNTER — Other Ambulatory Visit: Payer: Self-pay | Admitting: General Practice

## 2013-01-31 DIAGNOSIS — R7309 Other abnormal glucose: Secondary | ICD-10-CM

## 2013-01-31 LAB — HEMOGLOBIN A1C: Hgb A1c MFr Bld: 6 % (ref 4.6–6.5)

## 2013-01-31 MED ORDER — AMLODIPINE BESYLATE 10 MG PO TABS: 10.0000 mg | ORAL_TABLET | Freq: Every day | ORAL | Status: DC

## 2013-01-31 MED ORDER — SIMVASTATIN 20 MG PO TABS: 20.0000 mg | ORAL_TABLET | Freq: Every day | ORAL | Status: DC

## 2013-01-31 NOTE — Telephone Encounter (Signed)
Medication filled.  

## 2013-01-31 NOTE — Telephone Encounter (Signed)
Patient called and stated that her medication refill for amLODipine (NORVASC) 10 MG tablet and simvastatin (ZOCOR) 20 MG tablet was suppose to be called in on Friday. Patient called her pharmacy and they stated they did not receive them. thanks   Pharmacy [id number] - [place], [place] - [address] AT [facility] OF [address]

## 2013-02-22 ENCOUNTER — Encounter (HOSPITAL_COMMUNITY): Payer: Self-pay | Admitting: Emergency Medicine

## 2013-02-22 ENCOUNTER — Emergency Department (HOSPITAL_COMMUNITY): Payer: BC Managed Care – PPO

## 2013-02-22 ENCOUNTER — Observation Stay (HOSPITAL_COMMUNITY)
Admission: EM | Admit: 2013-02-22 | Discharge: 2013-02-22 | Disposition: A | Discharge status: Home / Self Care | Payer: BC Managed Care – PPO | Attending: Internal Medicine | Admitting: Internal Medicine

## 2013-02-22 DIAGNOSIS — F329 Major depressive disorder, single episode, unspecified: Secondary | ICD-10-CM | POA: Diagnosis present

## 2013-02-22 DIAGNOSIS — E785 Hyperlipidemia, unspecified: Secondary | ICD-10-CM | POA: Diagnosis present

## 2013-02-22 DIAGNOSIS — F17200 Nicotine dependence, unspecified, uncomplicated: Secondary | ICD-10-CM | POA: Insufficient documentation

## 2013-02-22 DIAGNOSIS — R072 Precordial pain: Secondary | ICD-10-CM

## 2013-02-22 DIAGNOSIS — Z79899 Other long term (current) drug therapy: Secondary | ICD-10-CM | POA: Insufficient documentation

## 2013-02-22 DIAGNOSIS — F32A Depression, unspecified: Secondary | ICD-10-CM | POA: Diagnosis present

## 2013-02-22 DIAGNOSIS — E876 Hypokalemia: Secondary | ICD-10-CM | POA: Diagnosis present

## 2013-02-22 DIAGNOSIS — F3289 Other specified depressive episodes: Secondary | ICD-10-CM | POA: Diagnosis present

## 2013-02-22 DIAGNOSIS — I10 Essential (primary) hypertension: Secondary | ICD-10-CM | POA: Diagnosis present

## 2013-02-22 DIAGNOSIS — R079 Chest pain, unspecified: Principal | ICD-10-CM | POA: Diagnosis present

## 2013-02-22 DIAGNOSIS — H409 Unspecified glaucoma: Secondary | ICD-10-CM | POA: Insufficient documentation

## 2013-02-22 LAB — BASIC METABOLIC PANEL
BUN: 7 mg/dL (ref 6–23)
BUN: 9 mg/dL (ref 6–23)
CO2: 28 mEq/L (ref 19–32)
Calcium: 10.2 mg/dL (ref 8.4–10.5)
Calcium: 10 mg/dL (ref 8.4–10.5)
Chloride: 99 mEq/L (ref 96–112)
Creatinine, Ser: 0.61 mg/dL (ref 0.50–1.10)
Creatinine, Ser: 0.7 mg/dL (ref 0.50–1.10)
GFR calc Af Amer: >90 mL/min (ref >90)
GFR calc non Af Amer: >90 mL/min (ref >90)
GFR calc non Af Amer: >90 mL/min (ref >90)
Sodium: 136 mEq/L (ref 135–145)
Sodium: 137 mEq/L (ref 135–145)

## 2013-02-22 LAB — COMPREHENSIVE METABOLIC PANEL
ALT: 18 U/L (ref 0–35)
AST: 20 U/L (ref 0–37)
Albumin: 4.2 g/dL (ref 3.5–5.2)
Alkaline Phosphatase: 60 U/L (ref 39–117)
CO2: 28 mEq/L (ref 19–32)
Chloride: 98 mEq/L (ref 96–112)
Creatinine, Ser: 0.66 mg/dL (ref 0.50–1.10)
GFR calc non Af Amer: >90 mL/min (ref >90)
Glucose, Bld: 96 mg/dL (ref 70–99)
Sodium: 138 mEq/L (ref 135–145)
Total Bilirubin: 0.5 mg/dL (ref 0.3–1.2)

## 2013-02-22 LAB — CBC WITH DIFFERENTIAL/PLATELET
Basophils Absolute: 0.1 10*3/uL (ref 0.0–0.1)
Eosinophils Relative: 2 % (ref 0–5)
HCT: 35.9 % — ABNORMAL LOW (ref 36.0–46.0)
Hemoglobin: 12.3 g/dL (ref 12.0–15.0)
Lymphocytes Relative: 49 % — ABNORMAL HIGH (ref 12–46)
Lymphs Abs: 2.9 10*3/uL (ref 0.7–4.0)
MCH: 28.4 pg (ref 26.0–34.0)
MCV: 82.9 fL (ref 78.0–100.0)
Monocytes Absolute: 0.5 10*3/uL (ref 0.1–1.0)
Neutro Abs: 2.4 10*3/uL (ref 1.7–7.7)
Neutrophils Relative %: 40 % — ABNORMAL LOW (ref 43–77)
RBC: 4.33 MIL/uL (ref 3.87–5.11)
RDW: 13.6 % (ref 11.5–15.5)
WBC: 6 10*3/uL (ref 4.0–10.5)

## 2013-02-22 LAB — CBC
Hemoglobin: 12.5 g/dL (ref 12.0–15.0)
MCH: 28.2 pg (ref 26.0–34.0)
Platelets: 309 10*3/uL (ref 150–400)
RBC: 4.43 MIL/uL (ref 3.87–5.11)
WBC: 4.7 10*3/uL (ref 4.0–10.5)

## 2013-02-22 LAB — MAGNESIUM: Magnesium: 1.9 mg/dL (ref 1.5–2.5)

## 2013-02-22 LAB — LIPID PANEL
Cholesterol: 181 mg/dL (ref 0–200)
Triglycerides: 64 mg/dL (ref <150)
VLDL: 13 mg/dL (ref 0–40)

## 2013-02-22 LAB — TROPONIN I
Troponin I: <0.3 ng/mL (ref <0.30)
Troponin I: <0.3 ng/mL (ref <0.30)

## 2013-02-22 LAB — PHOSPHORUS: Phosphorus: 3.1 mg/dL (ref 2.3–4.6)

## 2013-02-22 LAB — HEMOGLOBIN A1C
Hgb A1c MFr Bld: 5.6 % (ref <5.7)
Mean Plasma Glucose: 114 mg/dL (ref <117)

## 2013-02-22 LAB — TSH: TSH: 1.511 u[IU]/mL (ref 0.350–4.500)

## 2013-02-22 MED ORDER — ASPIRIN 81 MG PO TBEC: 81.0000 mg | DELAYED_RELEASE_TABLET | Freq: Every day | ORAL | Status: DC

## 2013-02-22 MED ORDER — HYDROCODONE-ACETAMINOPHEN 5-325 MG PO TABS: 1.0000 | ORAL_TABLET | ORAL | Status: DC | PRN

## 2013-02-22 MED ORDER — SIMVASTATIN 40 MG PO TABS: 40.0000 mg | ORAL_TABLET | Freq: Every day | ORAL | Status: DC

## 2013-02-22 MED ORDER — SODIUM CHLORIDE 0.9 % IJ SOLN: 3.0000 mL | Freq: Two times a day (BID) | INTRAMUSCULAR | Status: DC

## 2013-02-22 MED ORDER — TIMOLOL MALEATE 0.25 % OP SOLN
1.0000 [drp] | Freq: Two times a day (BID) | OPHTHALMIC | Status: DC
  Administered 2013-02-22: 1 [drp] via OPHTHALMIC
  Filled 2013-02-22: qty 5

## 2013-02-22 MED ORDER — DOCUSATE SODIUM 100 MG PO CAPS
100.0000 mg | ORAL_CAPSULE | Freq: Two times a day (BID) | ORAL | Status: DC
  Administered 2013-02-22: 100 mg via ORAL
  Filled 2013-02-22 (×2): qty 1

## 2013-02-22 MED ORDER — AMLODIPINE BESYLATE 10 MG PO TABS
10.0000 mg | ORAL_TABLET | Freq: Every day | ORAL | Status: DC
  Administered 2013-02-22: 10 mg via ORAL
  Filled 2013-02-22: qty 1

## 2013-02-22 MED ORDER — ACETAMINOPHEN 650 MG RE SUPP: 650.0000 mg | Freq: Four times a day (QID) | RECTAL | Status: DC | PRN

## 2013-02-22 MED ORDER — LATANOPROST 0.005 % OP SOLN
1.0000 [drp] | Freq: Every day | OPHTHALMIC | Status: DC
  Filled 2013-02-22: qty 2.5

## 2013-02-22 MED ORDER — ASPIRIN 81 MG PO CHEW
324.0000 mg | CHEWABLE_TABLET | Freq: Once | ORAL | Status: AC
  Administered 2013-02-22: 324 mg via ORAL
  Filled 2013-02-22: qty 4

## 2013-02-22 MED ORDER — SODIUM CHLORIDE 0.9 % IV SOLN
250.0000 mL | INTRAVENOUS | Status: DC | PRN
  Administered 2013-02-22: 250 mL via INTRAVENOUS

## 2013-02-22 MED ORDER — PANTOPRAZOLE SODIUM 40 MG PO TBEC: 40.0000 mg | DELAYED_RELEASE_TABLET | Freq: Every day | ORAL | Status: DC

## 2013-02-22 MED ORDER — ASPIRIN EC 81 MG PO TBEC
81.0000 mg | DELAYED_RELEASE_TABLET | Freq: Every day | ORAL | Status: DC
Start: 2013-02-22 — End: 2013-02-22
  Administered 2013-02-22: 81 mg via ORAL
  Filled 2013-02-22: qty 1

## 2013-02-22 MED ORDER — POTASSIUM CHLORIDE CRYS ER 20 MEQ PO TBCR
40.0000 meq | EXTENDED_RELEASE_TABLET | Freq: Once | ORAL | Status: AC
  Administered 2013-02-22: 40 meq via ORAL
  Filled 2013-02-22: qty 2

## 2013-02-22 MED ORDER — MORPHINE SULFATE 2 MG/ML IJ SOLN: 1.0000 mg | INTRAMUSCULAR | Status: DC | PRN

## 2013-02-22 MED ORDER — GI COCKTAIL ~~LOC~~
30.0000 mL | Freq: Three times a day (TID) | ORAL | Status: DC | PRN
  Administered 2013-02-22: 30 mL via ORAL
  Filled 2013-02-22: qty 30

## 2013-02-22 MED ORDER — ONDANSETRON HCL 4 MG PO TABS: 4.0000 mg | ORAL_TABLET | Freq: Four times a day (QID) | ORAL | Status: DC | PRN

## 2013-02-22 MED ORDER — ALBUTEROL SULFATE HFA 108 (90 BASE) MCG/ACT IN AERS
2.0000 | INHALATION_SPRAY | RESPIRATORY_TRACT | Status: DC | PRN
  Filled 2013-02-22: qty 6.7

## 2013-02-22 MED ORDER — POTASSIUM CHLORIDE 10 MEQ/100ML IV SOLN
10.0000 meq | INTRAVENOUS | Status: AC
  Administered 2013-02-22 (×2): 10 meq via INTRAVENOUS
  Filled 2013-02-22 (×2): qty 100

## 2013-02-22 MED ORDER — ENOXAPARIN SODIUM 40 MG/0.4ML ~~LOC~~ SOLN
40.0000 mg | SUBCUTANEOUS | Status: DC
  Administered 2013-02-22: 40 mg via SUBCUTANEOUS
  Filled 2013-02-22: qty 0.4

## 2013-02-22 MED ORDER — ONDANSETRON HCL 4 MG/2ML IJ SOLN: 4.0000 mg | Freq: Four times a day (QID) | INTRAMUSCULAR | Status: DC | PRN

## 2013-02-22 MED ORDER — SODIUM CHLORIDE 0.9 % IJ SOLN: 3.0000 mL | INTRAMUSCULAR | Status: DC | PRN

## 2013-02-22 MED ORDER — ACETAMINOPHEN 325 MG PO TABS: 650.0000 mg | ORAL_TABLET | Freq: Four times a day (QID) | ORAL | Status: DC | PRN

## 2013-02-22 MED ORDER — SIMVASTATIN 20 MG PO TABS
20.0000 mg | ORAL_TABLET | Freq: Every day | ORAL | Status: DC
  Filled 2013-02-22: qty 1

## 2013-02-22 NOTE — Discharge Summary (Signed)
Physician Discharge Summary  Janet Pierce ZOX:[id number] DOB: 1950/12/15 DOA: 02/22/2013  PCP: Neena Rhymes, MD  Admit date: 02/22/2013 Discharge date: 02/22/2013  Time spent: 65 minutes  Recommendations for Outpatient Follow-up:  1. Patient is to followup with Neena Rhymes, MD one week post discharge. On followup patient will need a basic metabolic profile done to followup on electrolytes and renal function. Patient will also be placed on a PPI. Patient's Zocor has been increased to 40 mg daily. 2. Patient will be called by [facility] cardiology to be scheduled for outpatient stress test.  Discharge Diagnoses:  Principal Problem:   CHEST PAIN UNSPECIFIED Active Problems:   HYPERLIPIDEMIA   DEPRESSIVE DISORDER   HYPERTENSION, BENIGN ESSENTIAL   Hypokalemia   Discharge Condition: Stable and improved  Diet recommendation: Heart healthy  Filed Weights   02/22/13 0031 02/22/13 0451  Weight: 60.782 kg (134 lb) 60.4 kg (133 lb 2.5 oz)    History of present illness:  Janet Pierce is a 62 y.o. female  has a past medical history of Retinal detachment; Scoliosis; Hypertension; Glaucoma; and Hyperlipidemia.  Presented with  After she got off work around 6 Pm she started to have tightness in her chest and feeling of something blocking her. She had pain radiating to left shoulder and arm. She continued to have pain and presented to ER at 12 am. The chest pain occurred on two occasions. Describes her sensation as mild but alarming. She has had a lot of stress lately due to health problems with her family.      Hospital Course:  #1 atypical chest pain Patient had presented with chest pain midsternal and primary physician with radiation to the left upper extremity. Patient described the chest tightness and stated that she felt something was blocking. Patient was admitted to telemetry. Cardiac enzymes were cycled which were negative x3. EKG which was done showed no acute ST-T wave  abnormalities. 2-D echo was also done which showed an EF of 55-60% with no wall motion abnormalities and prominent fatty infiltration of the AV groove near the tricuspid annulus. Chest x-ray which was done was negative for any infiltrate. Patient was placed on aspirin. Patient improved clinically. Patient did state that had a similar episode before in the past and when she was on a PPI for a month her symptoms improved. Patient also stated on a lot of stress. Due to patient's age, tobacco abuse, hypertension, hyperlipidemia patient will likely benefit for outpatient stress test. Discussed patient with Dr. Excell Seltzer of [facility] cardiology and patient will be called for an appointment to be scheduled for outpatient stress test. Patient will be discharged on a PPI.  #2 hyperlipidemia On admission patient was noted to have a history of hyperlipidemia. Fasting lipid panel which was done and the LDL of 108. Patient was on 20 mg of Zocor at home. This dose has been increased to 40 mg daily with a goal LDL less than 70. Patient will followup with PCP as outpatient.  #3 hypertension Remained stable throughout the hospitalization. Patient was maintained on Norvasc.  #4 hypokalemia Patient noted to be hypokalemic during the hospitalization. Magnesium level was checked which came back at 1.9. Patient's potassium was repleted and hypokalemia had resolved by day of discharge.  The rest of patient's chronic medical issues remained stable throughout the hospitalization the patient be discharged in stable and improved condition.  Procedures:  2-D echo 02/22/2013  Chest x-ray 02/22/2013  Consultations:  None  Discharge Exam: Filed Vitals:   02/22/13 1428  BP: 117/68  Pulse: 88  Temp: 98.6 F (37 C)  Resp: 18    General: NAD Cardiovascular: RRR Respiratory: CTAB  Discharge Instructions      Discharge Orders   Future Orders Complete By Expires   Diet - low sodium heart healthy  As directed     Discharge instructions  As directed    Comments:     Follow up Neena Rhymes, MD in 1 week. [place] cardiology will call with appointment for outpatient stress test.   Increase activity slowly  As directed        Medication List         acetaminophen 500 MG tablet  Commonly known as:  TYLENOL  Take 1,000 mg by mouth every 6 (six) hours as needed for pain.     amLODipine 10 MG tablet  Commonly known as:  NORVASC  Take 10 mg by mouth daily.     aspirin 81 MG EC tablet  Take 1 tablet (81 mg total) by mouth daily.     bimatoprost 0.01 % Soln  Commonly known as:  LUMIGAN  Place 1 drop into the right eye at bedtime.     meloxicam 7.5 MG tablet  Commonly known as:  MOBIC  Take 2 tablets (15 mg total) by mouth daily.     multivitamin with minerals Tabs tablet  Take 1 tablet by mouth every morning.     pantoprazole 40 MG tablet  Commonly known as:  PROTONIX  Take 1 tablet (40 mg total) by mouth daily at 6 (six) AM.  Start taking on:  02/23/2013     simvastatin 40 MG tablet  Commonly known as:  ZOCOR  Take 1 tablet (40 mg total) by mouth daily before breakfast.     timolol 0.25 % ophthalmic solution  Commonly known as:  BETIMOL  Place 1 drop into the right eye 2 (two) times daily.       No Known Allergies Follow-up Information   Follow up with Neena Rhymes, MD. Schedule an appointment as soon as possible for a visit in 1 week.   Specialty:  Family Medicine   Contact information:   [phone] [place] [address] [phone]       Follow up with [facility]). (Patient will be called with appointment time for outpatient stress test.)    Specialty:  Cardiology   Contact information:   [address], [address] [place] [address] [phone]       The results of significant diagnostics from this hospitalization (including imaging, microbiology, ancillary and laboratory) are listed below for reference.     Significant Diagnostic Studies: Dg Chest 2 View  02/22/2013   CLINICAL DATA:  Sudden onset chest pain.  EXAM: CHEST  2 VIEW  COMPARISON:  Chest x-ray 09/20/2012.  FINDINGS: Lung volumes are normal. No consolidative airspace disease. No pleural effusions. No pneumothorax. No pulmonary nodule or mass noted. Pulmonary vasculature and the cardiomediastinal silhouette are within normal limits. Severe S-shaped scoliosis of the thoracolumbar spine convex to the right in the mid thoracic spine and to the left at the thoracolumbar junction.  IMPRESSION: 1.  No radiographic evidence of acute cardiopulmonary disease.   Electronically Signed   By: Trudie Reed M.D.   On: 02/22/2013 01:47    Microbiology: No results found for this or any previous visit (from the past 240 hour(s)).   Labs: Basic Metabolic Panel:  Recent Labs Lab 02/22/13 0110 02/22/13 0759 02/22/13  1325  NA 138  --  136  K 3.0*  --  3.9  CL 98  --  99  CO2 28  --  25  GLUCOSE 96  --  96  BUN 9  --  7  CREATININE 0.66  --  0.61  CALCIUM 9.8  --  10.2  MG  --  1.9  --   PHOS  --  3.1  --    Liver Function Tests:  Recent Labs Lab 02/22/13 0110  AST 20  ALT 18  ALKPHOS 60  BILITOT 0.5  PROT 7.6  ALBUMIN 4.2   No results found for this basename: LIPASE, AMYLASE,  in the last 168 hours No results found for this basename: AMMONIA,  in the last 168 hours CBC:  Recent Labs Lab 02/22/13 0110 02/22/13 0759  WBC 6.0 4.7  NEUTROABS 2.4  --   HGB 12.3 12.5  HCT 35.9* 36.7  MCV 82.9 82.8  PLT 269 309   Cardiac Enzymes:  Recent Labs Lab 02/22/13 0110 02/22/13 0759 02/22/13 1325  TROPONINI <0.30 <0.30 <0.30   BNP: BNP (last 3 results) No results found for this basename: PROBNP,  in the last 8760 hours CBG: No results found for this basename: GLUCAP,  in the last 168 hours     Signed:  [facility]  [facility] 02/22/2013, 3:34 PM

## 2013-02-22 NOTE — H&P (Signed)
PCP:  Neena Rhymes, MD    Chief Complaint:  Chest pain  HPI: Janet Pierce is a 62 y.o. female   has a past medical history of Retinal detachment; Scoliosis; Hypertension; Glaucoma; and Hyperlipidemia.   Presented with  After she got off work around 6 Pm she started to have tightness in her chest and feeling of something blocking her. She had pain radiating to left shoulder and arm. She continued to have pain and presented to ER at 12 am. The chest pain occurred on two occasions. Describes her sensation as mild but alarming.  She has had a lot of stress lately due to health problems with her family.   Review of Systems:    Pertinent positives include: chest pain, denies shortness of breath but was aware of her breathing.   Constitutional:  No weight loss, night sweats, Fevers, chills, fatigue, weight loss  HEENT:  No headaches, Difficulty swallowing,Tooth/dental problems,Sore throat,  No sneezing, itching, ear ache, nasal congestion, post nasal drip,  Cardio-vascular:  No Orthopnea, PND, anasarca, dizziness, palpitations.no Bilateral lower extremity swelling  GI:  No heartburn, indigestion, abdominal pain, nausea, vomiting, diarrhea, change in bowel habits, loss of appetite, melena, blood in stool, hematemesis Resp:  no shortness of breath at rest. No dyspnea on exertion, No excess mucus, no productive cough, No non-productive cough, No coughing up of blood.No change in color of mucus.No wheezing. Skin:  no rash or lesions. No jaundice GU:  no dysuria, change in color of urine, no urgency or frequency. No straining to urinate.  No flank pain.  Musculoskeletal:  No joint pain or no joint swelling. No decreased range of motion. No back pain.  Psych:  No change in mood or affect. No depression or anxiety. No memory loss.  Neuro: no localizing neurological complaints, no tingling, no weakness, no double vision, no gait abnormality, no slurred speech, no confusion  Otherwise ROS  are negative except for above, 10 systems were reviewed  Past Medical History: Past Medical History  Diagnosis Date  . Retinal detachment     left eye prosthesis  . Scoliosis   . Hypertension   . Glaucoma   . Hyperlipidemia    Past Surgical History  Procedure Laterality Date  . Retinal detachment surgery    . Lipoma excision    . Tonsillectomy       Medications: Prior to Admission medications   Medication Sig Start Date End Date Taking? Authorizing Provider  acetaminophen (TYLENOL) 500 MG tablet Take 1,000 mg by mouth every 6 (six) hours as needed for pain.   Yes Historical Provider, MD  amLODipine (NORVASC) 10 MG tablet Take 10 mg by mouth daily.   Yes Historical Provider, MD  bimatoprost (LUMIGAN) 0.01 % SOLN Place 1 drop into the right eye at bedtime.   Yes Historical Provider, MD  meloxicam (MOBIC) 7.5 MG tablet Take 2 tablets (15 mg total) by mouth daily. 01/15/13  Yes Olivia Mackie, MD  Multiple Vitamin (MULTIVITAMIN WITH MINERALS) TABS Take 1 tablet by mouth every morning.    Yes Historical Provider, MD  simvastatin (ZOCOR) 20 MG tablet Take 1 tablet (20 mg total) by mouth daily before breakfast. 01/31/13  Yes Sheliah Hatch, MD  timolol (BETIMOL) 0.25 % ophthalmic solution Place 1 drop into the right eye 2 (two) times daily.    Yes Historical Provider, MD    Allergies:  No Known Allergies  Social History:  Ambulatory   independently   Lives at   home  reports that she has been smoking Cigarettes.  She has been smoking about 0.25 packs per day. She has never used smokeless tobacco. She reports that she drinks alcohol. She reports that she does not use illicit drugs.   Family History: family history includes Diabetes in her maternal grandmother; Hypertension in her mother; Lung cancer in her mother.    Physical Exam: Patient Vitals for the past 24 hrs:  BP Temp Pulse Resp SpO2 Height Weight  02/22/13 0031 127/87 mmHg 98.2 F (36.8 C) 102 20 100 % 5\' 4"   (1.626 m) 60.782 kg (134 lb)    1. General:  in No Acute distress 2. Psychological: Alert and  Oriented 3. Head/ENT:   Moist   Mucous Membranes                          Head Non traumatic, neck supple                          Normal   Dentition                         Proptosis of right eye left eye is a prosthesis 4. SKIN: normal  Skin turgor,  Skin clean Dry and intact no rash 5. Heart: Regular rate and rhythm no Murmur, Rub or gallop 6. Lungs: Clear to auscultation bilaterally, no wheezes or crackles   7. Abdomen: Soft, non-tender, Non distended 8. Lower extremities: no clubbing, cyanosis, or edema 9. Neurologically Grossly intact, moving all 4 extremities equally 10. MSK: Normal range of motion  body mass index is 22.99 kg/(m^2).   Labs on Admission:   Recent Labs  02/22/13 0110  NA 138  K 3.0*  CL 98  CO2 28  GLUCOSE 96  BUN 9  CREATININE 0.66  CALCIUM 9.8    Recent Labs  02/22/13 0110  AST 20  ALT 18  ALKPHOS 60  BILITOT 0.5  PROT 7.6  ALBUMIN 4.2   No results found for this basename: LIPASE, AMYLASE,  in the last 72 hours  Recent Labs  02/22/13 0110  WBC 6.0  NEUTROABS 2.4  HGB 12.3  HCT 35.9*  MCV 82.9  PLT 269    Recent Labs  02/22/13 0110  TROPONINI <0.30   No results found for this basename: TSH, T4TOTAL, FREET3, T3FREE, THYROIDAB,  in the last 72 hours No results found for this basename: VITAMINB12, FOLATE, FERRITIN, TIBC, IRON, RETICCTPCT,  in the last 72 hours Lab Results  Component Value Date   HGBA1C 6.0 01/31/2013    Estimated Creatinine Clearance: 63.8 ml/min (by C-G formula based on Cr of 0.66). ABG No results found for this basename: phart, pco2, po2, hco3, tco2, acidbasedef, o2sat     No results found for this basename: DDIMER     Other results:  I have pearsonaly reviewed this: ECG REPORT  Rate: 101  Rhythm: Sinus tachycardia ST&T Change: No ischemia   Cultures: No results found for this basename: sdes,  specrequest, cult, reptstatus       Radiological Exams on Admission: Dg Chest 2 View  02/22/2013   CLINICAL DATA:  Sudden onset chest pain.  EXAM: CHEST  2 VIEW  COMPARISON:  Chest x-ray 09/20/2012.  FINDINGS: Lung volumes are normal. No consolidative airspace disease. No pleural effusions. No pneumothorax. No pulmonary nodule or mass noted. Pulmonary vasculature and the cardiomediastinal silhouette are within normal limits.  Severe S-shaped scoliosis of the thoracolumbar spine convex to the right in the mid thoracic spine and to the left at the thoracolumbar junction.  IMPRESSION: 1.  No radiographic evidence of acute cardiopulmonary disease.   Electronically Signed   By: Trudie Reed M.D.   On: 02/22/2013 01:47    Chart has been reviewed  Assessment/Plan Patient is a 62 year old female with past history of hypertension and hyperlipidemia who presents with atypical chest pain  Present on Admission:  . CHEST PAIN UNSPECIFIED - chest pain atypical we will admit to telemetry cycle cardiac markers obtained serial EKG given her risk factors of tobacco abuse hypertension hyperlipidemia and age about 65 has not patient she should have a stress test performed given repeated presentations with chest pain. Marland Kitchen HYPERTENSION, BENIGN ESSENTIAL - continue Norvasc  . Hypokalemia - Will replace check magnesium level   Prophylaxis: Lovenox, Protonix  CODE STATUS: FULL CODE  Other plan as per orders.  I have spent a total of 55 min on this admission  Mekhai Venuto 02/22/2013, 2:56 AM

## 2013-02-22 NOTE — ED Provider Notes (Signed)
CSN: [id number]     Arrival date & time 02/22/13  0027 History   First MD Initiated Contact with Patient 02/22/13 0044     Chief Complaint  Patient presents with  . Chest Pain   (Consider location/radiation/quality/duration/timing/severity/associated sxs/prior Treatment) HPI States she began having central chest tightness around 6 PM this evening while watching television. Patient states the tightness has progressed she's had some mild shortness of breath especially when lying flat. The tightness now has resolved. She has had no cough, lower extremity swelling or pain. She denies any fevers or chills. Patient states that earlier she was having some pain in her left shoulder and left side of her neck which seem to have resolved as well. Patient is no family history of coronary artery disease. She does admit to smoking cigarettes. She has a history of hypertension and hyperlipidemia. She just does not know she's been seen by cardiologist in the past. Past Medical History  Diagnosis Date  . Retinal detachment     left eye prosthesis  . Scoliosis   . Hypertension   . Glaucoma   . Hyperlipidemia    Past Surgical History  Procedure Laterality Date  . Retinal detachment surgery    . Lipoma excision    . Tonsillectomy     Family History  Problem Relation Age of Onset  . Lung cancer Mother   . Hypertension Mother   . Diabetes Maternal Grandmother    History  Substance Use Topics  . Smoking status: Former Smoker -- 0.25 packs/day    Types: Cigarettes    Quit date: 06/22/2012  . Smokeless tobacco: Never Used     Comment: using nicorette gum  . Alcohol Use: Yes     Comment: occasionally   OB History   Grav Para Term Preterm Abortions TAB SAB Ect Mult Living   2 2             Obstetric Comments   Daughters in [place], [place]     Review of Systems  Constitutional: Negative for fever and chills.  Respiratory: Positive for chest tightness and shortness of breath. Negative for cough.    Cardiovascular: Negative for chest pain, palpitations and leg swelling.  Gastrointestinal: Negative for nausea, vomiting, abdominal pain and diarrhea.  Musculoskeletal: Negative for back pain, neck pain and neck stiffness.  Skin: Negative for rash and wound.  Neurological: Positive for headaches. Negative for dizziness, weakness, light-headedness and numbness.  All other systems reviewed and are negative.    Allergies  Review of patient's allergies indicates no known allergies.  Home Medications   Current Outpatient Rx  Name  Route  Sig  Dispense  Refill  . acetaminophen (TYLENOL) 500 MG tablet   Oral   Take 1,000 mg by mouth every 6 (six) hours as needed for pain.         Marland Kitchen amLODipine (NORVASC) 10 MG tablet   Oral   Take 10 mg by mouth daily.         . bimatoprost (LUMIGAN) 0.01 % SOLN   Right Eye   Place 1 drop into the right eye at bedtime.         . meloxicam (MOBIC) 7.5 MG tablet   Oral   Take 2 tablets (15 mg total) by mouth daily.   30 tablet   0   . Multiple Vitamin (MULTIVITAMIN WITH MINERALS) TABS   Oral   Take 1 tablet by mouth every morning.          Marland Kitchen  simvastatin (ZOCOR) 20 MG tablet   Oral   Take 1 tablet (20 mg total) by mouth daily before breakfast.   30 tablet   3   . timolol (BETIMOL) 0.25 % ophthalmic solution   Right Eye   Place 1 drop into the right eye 2 (two) times daily.           BP 127/87  Pulse 102  Temp(Src) 98.2 F (36.8 C)  Resp 20  Ht 5\' 4"  (1.626 m)  Wt 134 lb (60.782 kg)  BMI 22.99 kg/m2  SpO2 100% Physical Exam  Nursing note and vitals reviewed. Constitutional: She is oriented to person, place, and time. She appears well-developed and well-nourished. No distress.  HENT:  Head: Normocephalic and atraumatic.  Mouth/Throat: Oropharynx is clear and moist.  Eyes: EOM are normal. Pupils are equal, round, and reactive to light.  Neck: Normal range of motion. Neck supple.  Cardiovascular: Normal rate and regular  rhythm.  Exam reveals no gallop and no friction rub.   No murmur heard. Pulmonary/Chest: Effort normal and breath sounds normal. No respiratory distress. She has no wheezes. She has no rales. She exhibits no tenderness.  Abdominal: Soft. Bowel sounds are normal. She exhibits no distension and no mass. There is no tenderness. There is no rebound and no guarding.  Musculoskeletal: Normal range of motion. She exhibits no edema and no tenderness.  swelling or tenderness.  Neurological: She is alert and oriented to person, place, and time.  Patient is alert and oriented x3 with clear, goal oriented speech. Patient has 5/5 motor in all extremities. Sensation is intact to light touch. Patient has a normal gait and walks without assistance.   Skin: Skin is warm and dry. No rash noted. No erythema.  Psychiatric: She has a normal mood and affect. Her behavior is normal.    ED Course  Procedures (including critical care time) Labs Review Labs Reviewed  CBC WITH DIFFERENTIAL - Abnormal; Notable for the following:    HCT 35.9 (*)    Neutrophils Relative % 40 (*)    Lymphocytes Relative 49 (*)    All other components within normal limits  COMPREHENSIVE METABOLIC PANEL  TROPONIN I   Imaging Review Dg Chest 2 View  02/22/2013   CLINICAL DATA:  Sudden onset chest pain.  EXAM: CHEST  2 VIEW  COMPARISON:  Chest x-ray 09/20/2012.  FINDINGS: Lung volumes are normal. No consolidative airspace disease. No pleural effusions. No pneumothorax. No pulmonary nodule or mass noted. Pulmonary vasculature and the cardiomediastinal silhouette are within normal limits. Severe S-shaped scoliosis of the thoracolumbar spine convex to the right in the mid thoracic spine and to the left at the thoracolumbar junction.  IMPRESSION: 1.  No radiographic evidence of acute cardiopulmonary disease.   Electronically Signed   By: Trudie Reed M.D.   On: 02/22/2013 01:47    EKG Interpretation     Ventricular Rate:  101 PR  Interval:  207 QRS Duration: 76 QT Interval:  371 QTC Calculation: 481 R Axis:   20 Text Interpretation:  Age not entered, assumed to be  62 years old for purpose of ECG interpretation Sinus tachycardia Prolonged PR interval Probable left atrial enlargement Nonspecific T abnrm, anterolateral leads Borderline prolonged QT interval            MDM   Patient continues to be chest pain-free in the emergency department. Discuss with Dr. Felipa Furnace over and will admit for chest pain.  Loren Racer, MD 02/22/13 [phone]

## 2013-02-22 NOTE — ED Notes (Signed)
Pt states that around 6pm she began to have tightness to her chest; pt states that it has progressed to having pain to left arm and shoulder and feeling short of breath and having a headache in addition to continuing to have tightness to her chest

## 2013-02-22 NOTE — Progress Notes (Signed)
  Echocardiogram 2D Echocardiogram has been performed.  Janet Pierce 02/22/2013, 10:14 AM

## 2013-03-01 ENCOUNTER — Other Ambulatory Visit: Payer: Self-pay

## 2013-03-01 DIAGNOSIS — R079 Chest pain, unspecified: Secondary | ICD-10-CM

## 2013-03-25 ENCOUNTER — Other Ambulatory Visit (HOSPITAL_COMMUNITY): Payer: BC Managed Care – PPO

## 2013-04-04 ENCOUNTER — Encounter: Payer: Self-pay | Admitting: Family Medicine

## 2013-04-04 ENCOUNTER — Ambulatory Visit (INDEPENDENT_AMBULATORY_CARE_PROVIDER_SITE_OTHER): Payer: BC Managed Care – PPO | Admitting: Family Medicine

## 2013-04-04 VITALS — BP 130/84 | HR 85 | Temp 98.3°F | Resp 16 | Wt 135.0 lb

## 2013-04-04 DIAGNOSIS — K219 Gastro-esophageal reflux disease without esophagitis: Secondary | ICD-10-CM | POA: Insufficient documentation

## 2013-04-04 DIAGNOSIS — R5381 Other malaise: Secondary | ICD-10-CM

## 2013-04-04 DIAGNOSIS — J309 Allergic rhinitis, unspecified: Secondary | ICD-10-CM

## 2013-04-04 LAB — BASIC METABOLIC PANEL
Calcium: 9.5 mg/dL (ref 8.4–10.5)
Chloride: 101 mEq/L (ref 96–112)
GFR: 109.03 mL/min (ref >60.00)
Glucose, Bld: 88 mg/dL (ref 70–99)
Potassium: 3.6 mEq/L (ref 3.5–5.1)
Sodium: 139 mEq/L (ref 135–145)

## 2013-04-04 LAB — HEPATIC FUNCTION PANEL
AST: 25 U/L (ref 0–37)
Albumin: 4.5 g/dL (ref 3.5–5.2)
Alkaline Phosphatase: 48 U/L (ref 39–117)
Bilirubin, Direct: 0.1 mg/dL (ref 0.0–0.3)
Total Bilirubin: 1 mg/dL (ref 0.3–1.2)

## 2013-04-04 LAB — CBC WITH DIFFERENTIAL/PLATELET
Basophils Absolute: 0 10*3/uL (ref 0.0–0.1)
Eosinophils Absolute: 0.1 10*3/uL (ref 0.0–0.7)
Eosinophils Relative: 1.6 % (ref 0.0–5.0)
Lymphocytes Relative: 36.1 % (ref 12.0–46.0)
Lymphs Abs: 1.4 10*3/uL (ref 0.7–4.0)
MCHC: 33.1 g/dL (ref 30.0–36.0)
MCV: 85.1 fl (ref 78.0–100.0)
Monocytes Absolute: 0.3 10*3/uL (ref 0.1–1.0)
Neutrophils Relative %: 53.6 % (ref 43.0–77.0)
Platelets: 301 10*3/uL (ref 150.0–400.0)
RBC: 4.47 Mil/uL (ref 3.87–5.11)
RDW: 13.2 % (ref 11.5–14.6)
WBC: 4 10*3/uL — ABNORMAL LOW (ref 4.5–10.5)

## 2013-04-04 MED ORDER — FLUTICASONE PROPIONATE 50 MCG/ACT NA SUSP: 2.0000 | Freq: Every day | NASAL | Status: DC

## 2013-04-04 MED ORDER — PANTOPRAZOLE SODIUM 40 MG PO TBEC: 40.0000 mg | DELAYED_RELEASE_TABLET | Freq: Every day | ORAL | Status: DC

## 2013-04-04 MED ORDER — ATORVASTATIN CALCIUM 20 MG PO TABS: 20.0000 mg | ORAL_TABLET | Freq: Every day | ORAL | Status: DC

## 2013-04-04 NOTE — Progress Notes (Signed)
Pre visit review using our clinic review tool, if applicable. No additional management support is needed unless otherwise documented below in the visit note. 

## 2013-04-04 NOTE — Assessment & Plan Note (Signed)
Unchanged.  Clarified for pt that she should be taking Protonix daily.

## 2013-04-04 NOTE — Assessment & Plan Note (Signed)
Deteriorated.  Suspect this is cause of pt's dry mouth, dizziness and 'funny feeling' in her head.  Start nasal steroid spray.  Monitor for improvement.

## 2013-04-04 NOTE — Assessment & Plan Note (Signed)
Recurrent problem for pt.  Check labs to assess for metabolic cause.  Will follow.

## 2013-04-04 NOTE — Progress Notes (Signed)
   Subjective:    Patient ID: Janet Pierce, female    DOB: 30-Oct-1950, 62 y.o.   MRN: [id number]  HPI Fatigue, light headed, dry mouth, night sweats, muscle aches in arms and legs.  Was prescribed 40mg  of Simvastatin after hospital d/c but went back to 20mg .  Had to reschedule stress test.  sxs started early November.  Initially thought she was feeling better but in last 1-2 weeks sxs have returned.  Has quit smoking.  Increased water intake.     Review of Systems For ROS see HPI     Objective:   Physical Exam  Vitals reviewed. Constitutional: She is oriented to person, place, and time. She appears well-developed and well-nourished. No distress.  HENT:  Head: Normocephalic and atraumatic.  Right Ear: Tympanic membrane normal.  Left Ear: Tympanic membrane normal.  Nose: Mucosal edema and rhinorrhea present. Right sinus exhibits no maxillary sinus tenderness and no frontal sinus tenderness. Left sinus exhibits no maxillary sinus tenderness and no frontal sinus tenderness.  Mouth/Throat: Mucous membranes are normal. Posterior oropharyngeal erythema (w/ PND) present.  Eyes: Conjunctivae and EOM are normal. Pupils are equal, round, and reactive to light.  Neck: Normal range of motion. Neck supple.  Cardiovascular: Normal rate, regular rhythm and normal heart sounds.   Pulmonary/Chest: Effort normal and breath sounds normal. No respiratory distress. She has no wheezes. She has no rales.  Abdominal: Soft. Bowel sounds are normal. She exhibits no distension. There is no tenderness. There is no rebound.  Musculoskeletal: She exhibits no edema.  Lymphadenopathy:    She has no cervical adenopathy.  Neurological: She is alert and oriented to person, place, and time.  Skin: Skin is warm and dry.  Psychiatric: She has a normal mood and affect. Her behavior is normal.          Assessment & Plan:

## 2013-04-04 NOTE — Patient Instructions (Signed)
Schedule your complete physical in 3 months We'll notify you of your lab results and make any changes if needed STOP the Omeprazole START the Protonix for reflux STOP the Simvastatin for cholesterol START the Lipitor for cholesterol (less muscle aches) Start the nasal spray- 2 sprays each nostrils daily Call with any questions or concerns Hang in there! Happy Holidays!!!

## 2013-04-05 ENCOUNTER — Encounter: Payer: Self-pay | Admitting: General Practice

## 2013-05-05 ENCOUNTER — Encounter: Payer: Self-pay | Admitting: Gastroenterology

## 2013-05-05 ENCOUNTER — Ambulatory Visit: Payer: BC Managed Care – PPO | Admitting: Family Medicine

## 2013-05-17 ENCOUNTER — Encounter (HOSPITAL_COMMUNITY): Payer: Self-pay | Admitting: Emergency Medicine

## 2013-05-17 ENCOUNTER — Emergency Department (HOSPITAL_COMMUNITY)
Admission: EM | Admit: 2013-05-17 | Discharge: 2013-05-17 | Disposition: A | Discharge status: Home / Self Care | Payer: BC Managed Care – PPO | Attending: Emergency Medicine | Admitting: Emergency Medicine

## 2013-05-17 ENCOUNTER — Emergency Department (HOSPITAL_COMMUNITY): Payer: BC Managed Care – PPO

## 2013-05-17 DIAGNOSIS — R0789 Other chest pain: Secondary | ICD-10-CM | POA: Insufficient documentation

## 2013-05-17 DIAGNOSIS — Z8669 Personal history of other diseases of the nervous system and sense organs: Secondary | ICD-10-CM | POA: Insufficient documentation

## 2013-05-17 DIAGNOSIS — I10 Essential (primary) hypertension: Secondary | ICD-10-CM | POA: Insufficient documentation

## 2013-05-17 DIAGNOSIS — Z79899 Other long term (current) drug therapy: Secondary | ICD-10-CM | POA: Insufficient documentation

## 2013-05-17 DIAGNOSIS — IMO0002 Reserved for concepts with insufficient information to code with codable children: Secondary | ICD-10-CM | POA: Insufficient documentation

## 2013-05-17 DIAGNOSIS — F17200 Nicotine dependence, unspecified, uncomplicated: Secondary | ICD-10-CM | POA: Insufficient documentation

## 2013-05-17 DIAGNOSIS — M19019 Primary osteoarthritis, unspecified shoulder: Secondary | ICD-10-CM | POA: Insufficient documentation

## 2013-05-17 DIAGNOSIS — Z8739 Personal history of other diseases of the musculoskeletal system and connective tissue: Secondary | ICD-10-CM | POA: Insufficient documentation

## 2013-05-17 DIAGNOSIS — E785 Hyperlipidemia, unspecified: Secondary | ICD-10-CM | POA: Insufficient documentation

## 2013-05-17 LAB — HEPATIC FUNCTION PANEL
ALK PHOS: 56 U/L (ref 39–117)
ALT: 16 U/L (ref 0–35)
AST: 16 U/L (ref 0–37)
Albumin: 3.8 g/dL (ref 3.5–5.2)
Total Bilirubin: 0.4 mg/dL (ref 0.3–1.2)
Total Protein: 7 g/dL (ref 6.0–8.3)

## 2013-05-17 LAB — CBC WITH DIFFERENTIAL/PLATELET
Basophils Absolute: 0 10*3/uL (ref 0.0–0.1)
Basophils Relative: 1 % (ref 0–1)
EOS ABS: 0.1 10*3/uL (ref 0.0–0.7)
EOS PCT: 2 % (ref 0–5)
HCT: 37.8 % (ref 36.0–46.0)
Hemoglobin: 13 g/dL (ref 12.0–15.0)
LYMPHS ABS: 1.7 10*3/uL (ref 0.7–4.0)
Lymphocytes Relative: 32 % (ref 12–46)
MCH: 28.6 pg (ref 26.0–34.0)
MCHC: 34.4 g/dL (ref 30.0–36.0)
MCV: 83.3 fL (ref 78.0–100.0)
Monocytes Absolute: 0.4 10*3/uL (ref 0.1–1.0)
Monocytes Relative: 7 % (ref 3–12)
Neutro Abs: 3 10*3/uL (ref 1.7–7.7)
Neutrophils Relative %: 58 % (ref 43–77)
Platelets: 304 10*3/uL (ref 150–400)
RBC: 4.54 MIL/uL (ref 3.87–5.11)
RDW: 12.8 % (ref 11.5–15.5)
WBC: 5.2 10*3/uL (ref 4.0–10.5)

## 2013-05-17 LAB — BASIC METABOLIC PANEL
BUN: 10 mg/dL (ref 6–23)
CALCIUM: 9.5 mg/dL (ref 8.4–10.5)
CO2: 27 mEq/L (ref 19–32)
Chloride: 99 mEq/L (ref 96–112)
Creatinine, Ser: 0.68 mg/dL (ref 0.50–1.10)
GFR calc Af Amer: >90 mL/min (ref >90)
Glucose, Bld: 111 mg/dL — ABNORMAL HIGH (ref 70–99)
Potassium: 3.7 mEq/L (ref 3.7–5.3)
SODIUM: 139 meq/L (ref 137–147)

## 2013-05-17 LAB — POCT I-STAT TROPONIN I
Troponin i, poc: 0 ng/mL (ref 0.00–0.08)
Troponin i, poc: 0 ng/mL (ref 0.00–0.08)

## 2013-05-17 LAB — LIPASE, BLOOD: Lipase: 22 U/L (ref 11–59)

## 2013-05-17 LAB — D-DIMER, QUANTITATIVE: D-Dimer, Quant: <0.27 ug/mL-FEU (ref 0.00–0.48)

## 2013-05-17 MED ORDER — ASPIRIN 325 MG PO TABS
325.0000 mg | ORAL_TABLET | Freq: Once | ORAL | Status: AC
  Administered 2013-05-17: 325 mg via ORAL
  Filled 2013-05-17: qty 1

## 2013-05-17 MED ORDER — SODIUM CHLORIDE 0.9 % IV BOLUS (SEPSIS)
1000.0000 mL | Freq: Once | INTRAVENOUS | Status: AC
  Administered 2013-05-17: 1000 mL via INTRAVENOUS

## 2013-05-17 NOTE — ED Notes (Signed)
Pt c/o chest pain x 2 days; back pain; pt states about every hour having to catch her breath

## 2013-05-17 NOTE — Discharge Instructions (Signed)
Please call your doctor for a followup appointment within 24-48 hours. When you talk to your doctor please let them know that you were seen in the emergency department and have them acquire all of your records so that they can discuss the findings with you and formulate a treatment plan to fully care for your new and ongoing problems. Please keep appointment with gastroenterologist for this coming week Please take 81 mg of aspirin daily Please call and set up an appointment with cardiologist-highly recommend a stress test to be performed Please rest and stay hydrated Please monitor when chest pain comes about-take note as to what you were doing, with thoughts to having, what activity reviewing. Please continue monitor symptoms closely and if symptoms are to worsen or change (fever greater than 101, chills, neck pain, neck stiffness, shortness of breath, difficulty breathing, changes to worsen to chest pain, numbness, tingling, pain radiating down left arm, inability to open and close the jaw) is report back to emergency department immediately  Chest Pain (Nonspecific) It is often hard to give a specific diagnosis for the cause of chest pain. There is always a chance that your pain could be related to something serious, such as a heart attack or a blood clot in the lungs. You need to follow up with your caregiver for further evaluation. CAUSES   Heartburn.  Pneumonia or bronchitis.  Anxiety or stress.  Inflammation around your heart (pericarditis) or lung (pleuritis or pleurisy).  A blood clot in the lung.  A collapsed lung (pneumothorax). It can develop suddenly on its own (spontaneous pneumothorax) or from injury (trauma) to the chest.  Shingles infection (herpes zoster virus). The chest wall is composed of bones, muscles, and cartilage. Any of these can be the source of the pain.  The bones can be bruised by injury.  The muscles or cartilage can be strained by coughing or  overwork.  The cartilage can be affected by inflammation and become sore (costochondritis). DIAGNOSIS  Lab tests or other studies, such as X-rays, electrocardiography, stress testing, or cardiac imaging, may be needed to find the cause of your pain.  TREATMENT   Treatment depends on what may be causing your chest pain. Treatment may include:  Acid blockers for heartburn.  Anti-inflammatory medicine.  Pain medicine for inflammatory conditions.  Antibiotics if an infection is present.  You may be advised to change lifestyle habits. This includes stopping smoking and avoiding alcohol, caffeine, and chocolate.  You may be advised to keep your head raised (elevated) when sleeping. This reduces the chance of acid going backward from your stomach into your esophagus.  Most of the time, nonspecific chest pain will improve within 2 to 3 days with rest and mild pain medicine. HOME CARE INSTRUCTIONS   If antibiotics were prescribed, take your antibiotics as directed. Finish them even if you start to feel better.  For the next few days, avoid physical activities that bring on chest pain. Continue physical activities as directed.  Do not smoke.  Avoid drinking alcohol.  Only take over-the-counter or prescription medicine for pain, discomfort, or fever as directed by your caregiver.  Follow your caregiver's suggestions for further testing if your chest pain does not go away.  Keep any follow-up appointments you made. If you do not go to an appointment, you could develop lasting (chronic) problems with pain. If there is any problem keeping an appointment, you must call to reschedule. SEEK MEDICAL CARE IF:   You think you are having problems  from the medicine you are taking. Read your medicine instructions carefully.  Your chest pain does not go away, even after treatment.  You develop a rash with blisters on your chest. SEEK IMMEDIATE MEDICAL CARE IF:   You have increased chest pain or  pain that spreads to your arm, neck, jaw, back, or abdomen.  You develop shortness of breath, an increasing cough, or you are coughing up blood.  You have severe back or abdominal pain, feel nauseous, or vomit.  You develop severe weakness, fainting, or chills.  You have a fever. THIS IS AN EMERGENCY. Do not wait to see if the pain will go away. Get medical help at once. Call your local emergency services (911 in [place] Do not drive yourself to the hospital. MAKE SURE YOU:   Understand these instructions.  Will watch your condition.  Will get help right away if you are not doing well or get worse. Document Released: 01/15/2005 Document Revised: 06/30/2011 Document Reviewed: 11/11/2007 [facility] Patient Information 2014 [place].

## 2013-05-17 NOTE — ED Provider Notes (Signed)
CSN: [id number]     Arrival date & time 05/17/13  1521 History   First MD Initiated Contact with Patient 05/17/13 1543     Chief Complaint  Patient presents with  . Chest Pain   (Consider location/radiation/quality/duration/timing/severity/associated sxs/prior Treatment) The history is provided by the patient. No language interpreter was used.  Janet Pierce is a 63 y/o F with PMhx if retinal detachment, scoliosis, HTN, glaucoma, HLD presenting to the ED with chest tightness/chest pain that has been ongoing for the past 2-3 days. Patient reported that she is currently on her third attempt of smoking cessation. Patient reported that she has been using Nicoret gum, has been using at least 3 pieces per day. Patient reported that she has been having indigestion with the gum, but continues to use it. Patient reported that for the past 2-3 days she has been experiencing a "ball" in the center of her chest. Stated that she was seen and assessed by PCP, Dr. Birdie Riddle, who prescribed her prevacid that has been aiding in controlling the discomfort. Stated that when she belches she feels much better. Patient reported that she has been having random sporadic episodes of gasping for air, feeling like she has to catch her breath. Patient reported that she has been having right shoulder pain - stated that she has history of arthritis - discomfort is described as an aching sensation that is relieved with application of Bengay. Reported that she has an appointment with [facility] within the week. Stated that she has been feeling rather stressed lately secondary to her sister being admitted to the hospital for stroke. Denied hemoptysis, leg swelling, dizziness, weakness, fainting, abdominal pain, urinary symptoms, changes to bowel movements. PCP Dr. Birdie Riddle   Past Medical History  Diagnosis Date  . Retinal detachment     left eye prosthesis  . Scoliosis   . Hypertension   . Glaucoma   . Hyperlipidemia    Past  Surgical History  Procedure Laterality Date  . Retinal detachment surgery    . Lipoma excision    . Tonsillectomy     Family History  Problem Relation Age of Onset  . Lung cancer Mother   . Hypertension Mother   . Diabetes Maternal Grandmother    History  Substance Use Topics  . Smoking status: Current Every Day Smoker -- 0.25 packs/day    Types: Cigarettes    Last Attempt to Quit: 06/22/2012  . Smokeless tobacco: Never Used     Comment: using nicorette gum  . Alcohol Use: Yes     Comment: occasionally   OB History   Grav Para Term Preterm Abortions TAB SAB Ect Mult Living   2 2             Obstetric Comments   Daughters in [place], [place]     Review of Systems  Constitutional: Negative for fever and chills.  HENT: Negative for trouble swallowing.   Respiratory: Positive for chest tightness. Negative for cough and shortness of breath.   Cardiovascular: Positive for chest pain.  Gastrointestinal: Negative for nausea, vomiting, abdominal pain, diarrhea, constipation and blood in stool.  Musculoskeletal: Negative for back pain, neck pain and neck stiffness.  Neurological: Negative for dizziness.  All other systems reviewed and are negative.    Allergies  Review of patient's allergies indicates no known allergies.  Home Medications   Current Outpatient Rx  Name  Route  Sig  Dispense  Refill  . acetaminophen (TYLENOL) 500 MG tablet   Oral  Take 1,000 mg by mouth every 6 (six) hours as needed for pain.         Marland Kitchen amLODipine (NORVASC) 10 MG tablet   Oral   Take 10 mg by mouth daily.         Marland Kitchen atorvastatin (LIPITOR) 20 MG tablet   Oral   Take 1 tablet (20 mg total) by mouth daily.   90 tablet   3   . cholecalciferol (VITAMIN D) 1000 UNITS tablet   Oral   Take 1,000 Units by mouth daily.         . fluticasone (FLONASE) 50 MCG/ACT nasal spray   Each Nare   Place 2 sprays into both nostrils daily.   16 g   6   . latanoprost (XALATAN) 0.005 %  ophthalmic solution   Right Eye   Place 2 drops into the right eye at bedtime.         . meloxicam (MOBIC) 7.5 MG tablet   Oral   Take 2 tablets (15 mg total) by mouth daily.   30 tablet   0   . Multiple Vitamin (MULTIVITAMIN WITH MINERALS) TABS   Oral   Take 1 tablet by mouth every morning.          . nicotine polacrilex (NICORETTE) 2 MG gum   Oral   Take 2 mg by mouth as needed for smoking cessation.         . timolol (BETIMOL) 0.25 % ophthalmic solution   Right Eye   Place 1 drop into the right eye 2 (two) times daily.          . lansoprazole (PREVACID) 15 MG capsule   Oral   Take 15 mg by mouth daily at 12 noon.          BP 126/75  Pulse 77  Temp(Src) 97.9 F (36.6 C) (Oral)  Resp 16  SpO2 97% Physical Exam  Nursing note and vitals reviewed. Constitutional: She is oriented to person, place, and time. She appears well-developed and well-nourished. No distress.  HENT:  Head: Normocephalic and atraumatic.  Mouth/Throat: Oropharynx is clear and moist. No oropharyngeal exudate.  Eyes: Conjunctivae and EOM are normal. Pupils are equal, round, and reactive to light. Right eye exhibits no discharge. Left eye exhibits no discharge.  Neck: Normal range of motion. Neck supple. No tracheal deviation present.  Cardiovascular: Normal rate, regular rhythm and normal heart sounds.  Exam reveals no friction rub.   No murmur heard. Pulses:      Radial pulses are 2+ on the right side, and 2+ on the left side.       Dorsalis pedis pulses are 2+ on the right side, and 2+ on the left side.  Cap refill less than 3 seconds Negative leg swelling, negative pitting edema identified  Pulmonary/Chest: Effort normal and breath sounds normal. No respiratory distress. She has no wheezes. She has no rales. She exhibits no tenderness.  Musculoskeletal: Normal range of motion.  Full ROM to upper and lower extremities without difficulty noted, negative ataxia noted.  Lymphadenopathy:     She has no cervical adenopathy.  Neurological: She is alert and oriented to person, place, and time. She exhibits normal muscle tone. Coordination normal.  Cranial nerves III-XII grossly intact Strength 5+/5+ to upper and lower extremities bilaterally with resistance applied, equal distribution noted Sensation intact  Negative arm drift   Skin: Skin is warm and dry. No rash noted. She is not diaphoretic. No erythema.  Psychiatric:  She has a normal mood and affect. Her behavior is normal. Thought content normal.    ED Course  Procedures (including critical care time)  This provider reviewed patient's chart. Patient was seen and assessed in November 2014 and admitted to the hospital regarding ongoing chest pain. Serial Troponins performed during admission with all troponins having no elevation. Echocardiogram performed in November 2014 with LVEF of 55-60%. Unremarkable findings on the echocardiogram. Patient was referred to follow-up with cardiology when discharge, patient has not done so.   8:47 PM This provider re-assessed the patient. Patient reported that she has not had any chest pain while in the ED setting. Patient reported that she has not had any difficulty breathing.  Results for orders placed during the hospital encounter of 05/17/13  CBC WITH DIFFERENTIAL      Result Value Range   WBC 5.2  4.0 - 10.5 K/uL   RBC 4.54  3.87 - 5.11 MIL/uL   Hemoglobin 13.0  12.0 - 15.0 g/dL   HCT 37.8  36.0 - 46.0 %   MCV 83.3  78.0 - 100.0 fL   MCH 28.6  26.0 - 34.0 pg   MCHC 34.4  30.0 - 36.0 g/dL   RDW 12.8  11.5 - 15.5 %   Platelets 304  150 - 400 K/uL   Neutrophils Relative % 58  43 - 77 %   Neutro Abs 3.0  1.7 - 7.7 K/uL   Lymphocytes Relative 32  12 - 46 %   Lymphs Abs 1.7  0.7 - 4.0 K/uL   Monocytes Relative 7  3 - 12 %   Monocytes Absolute 0.4  0.1 - 1.0 K/uL   Eosinophils Relative 2  0 - 5 %   Eosinophils Absolute 0.1  0.0 - 0.7 K/uL   Basophils Relative 1  0 - 1 %   Basophils  Absolute 0.0  0.0 - 0.1 K/uL  BASIC METABOLIC PANEL      Result Value Range   Sodium 139  137 - 147 mEq/L   Potassium 3.7  3.7 - 5.3 mEq/L   Chloride 99  96 - 112 mEq/L   CO2 27  19 - 32 mEq/L   Glucose, Bld 111 (*) 70 - 99 mg/dL   BUN 10  6 - 23 mg/dL   Creatinine, Ser 0.68  0.50 - 1.10 mg/dL   Calcium 9.5  8.4 - 10.5 mg/dL   GFR calc non Af Amer >90  >90 mL/min   GFR calc Af Amer >90  >90 mL/min  HEPATIC FUNCTION PANEL      Result Value Range   Total Protein 7.0  6.0 - 8.3 g/dL   Albumin 3.8  3.5 - 5.2 g/dL   AST 16  0 - 37 U/L   ALT 16  0 - 35 U/L   Alkaline Phosphatase 56  39 - 117 U/L   Total Bilirubin 0.4  0.3 - 1.2 mg/dL   Bilirubin, Direct <0.2  0.0 - 0.3 mg/dL   Indirect Bilirubin NOT CALCULATED  0.3 - 0.9 mg/dL  LIPASE, BLOOD      Result Value Range   Lipase 22  11 - 59 U/L  D-DIMER, QUANTITATIVE      Result Value Range   D-Dimer, Quant <0.27  0.00 - 0.48 ug/mL-FEU  POCT I-STAT TROPONIN I      Result Value Range   Troponin i, poc 0.00  0.00 - 0.08 ng/mL   Comment 3  POCT I-STAT TROPONIN I      Result Value Range   Troponin i, poc 0.00  0.00 - 0.08 ng/mL   Comment 3            Dg Chest 2 View  05/17/2013   CLINICAL DATA:  Chest tightness, smoker  EXAM: CHEST  2 VIEW  COMPARISON:  Prior chest x-ray 02/22/2013  FINDINGS: Stable cardiac and mediastinal contours. The lungs are clear. Opacity projecting over the spine is similar compared to prior and likely related to the patient's underlying severe levoconvex scoliosis. No focal airspace consolidation, pleural effusion or pneumothorax. No acute osseous abnormality. Advanced levoconvex scoliosis centered at the thoracolumbar junction is similar in appearance to prior without significant interval progression.  IMPRESSION: Stable chest x-ray without evidence of active cardiopulmonary disease.   Electronically Signed   By: Jacqulynn Cadet M.D.   On: 05/17/2013 15:54   Labs Review Labs Reviewed  BASIC METABOLIC  PANEL - Abnormal; Notable for the following:    Glucose, Bld 111 (*)    All other components within normal limits  CBC WITH DIFFERENTIAL  HEPATIC FUNCTION PANEL  LIPASE, BLOOD  D-DIMER, QUANTITATIVE  POCT I-STAT TROPONIN I  POCT I-STAT TROPONIN I   Imaging Review Dg Chest 2 View  05/17/2013   CLINICAL DATA:  Chest tightness, smoker  EXAM: CHEST  2 VIEW  COMPARISON:  Prior chest x-ray 02/22/2013  FINDINGS: Stable cardiac and mediastinal contours. The lungs are clear. Opacity projecting over the spine is similar compared to prior and likely related to the patient's underlying severe levoconvex scoliosis. No focal airspace consolidation, pleural effusion or pneumothorax. No acute osseous abnormality. Advanced levoconvex scoliosis centered at the thoracolumbar junction is similar in appearance to prior without significant interval progression.  IMPRESSION: Stable chest x-ray without evidence of active cardiopulmonary disease.   Electronically Signed   By: Jacqulynn Cadet M.D.   On: 05/17/2013 15:54    EKG Interpretation    Date/Time:  Tuesday May 17 2013 15:31:28 EST Ventricular Rate:  91 PR Interval:  180 QRS Duration: 84 QT Interval:  364 QTC Calculation: 447 R Axis:   25 Text Interpretation:  Normal sinus rhythm Nonspecific T wave abnormality Abnormal ECG T waves in lateral leads slightly more prominent Confirmed by GOLDSTON  MD, SCOTT ([id number]) on 05/17/2013 3:56:14 PM            MDM   1. Atypical chest pain     Medications  aspirin tablet 325 mg (325 mg Oral Given 05/17/13 1723)  sodium chloride 0.9 % bolus 1,000 mL (1,000 mLs Intravenous New Bag/Given 05/17/13 1724)   Filed Vitals:   05/17/13 1538 05/17/13 2039  BP: 110/74 126/75  Pulse: 90 77  Temp: 97.9 F (36.6 C)   TempSrc: Oral   Resp: 16 16  SpO2: 98% 97%    Patient presenting to emergency department with chest tightness/chest discomfort that has been ongoing for the past 10 days. Stated that she has been  using Nicoret gum to aid in her smoking cessation, but stated that she has been using it more frequently and getting indigestion symptoms.  Patient reported that approximately 2-3 days she has been having a sensation as if there is a "ball" in the Center for chest-reported feelings of indigestion. Stated that she was seen and assessed by her primary care provider who recommended lansoprazole that the patient has been taking with relief. Stated that when she belches she feels better. Reported that over the past 2 days  she's been having episodes of trying to catch her breath sporadically throughout the day-at rest and with activity. Patient concerned. Patient reported that her sister is currently in [place] in the hospital due to a stroke-patient reported that she needs to come to hospital mainly to get assessed. Alert oriented. GCS 15. Heart rate and rhythm normal. Lungs clear to auscultation to upper and lower lobes. Negative tracheal deviation. Radial and DP pulses 2+ bilaterally. Cap refill less than 3 seconds. Negative pitting edema or swelling localized to lower extremities. Full range of motion to upper and lower extremities without difficulty or ataxia noted. EKG noted normal sinus rhythm with nonspecific T-wave abnormality-negative ischemic findings noted, negative new changes. First troponin negative elevation. Second troponin negative elevation. D-dimer negative elevation. CBC negative elevation white blood cell count noted. BMP negative findings. Lipase negative elevation. Hepatic function negative findings. Chest x-ray negative findings for pneumothorax or acute cardiac pulmonary disease. Doubt PE. Doubt AAA. Doubt pneumothorax. Doubt CHF exacerbation. Suspicion to be atypical chest pain, cannot rule out GERD-like symptoms, acid reflux. Patient was seen and assessed in 02/2013 regarding chest pain - serial troponins performed with no elevation, echo performed with unremarkable findings - patient  discharged and referred to cardiology which she has not followed up with as an outpatient. Patient stable, afebrile. Patient did not have any chest pain while in ED setting. Discussed case with attending physician, who agreed to plan of discharge. Discharged patient. Referred patient to primary care provider and cardiology. Discussed with patient to keep appointment with gastroenterologist. Discussed with patient to continue to take PPI as needed. Discussed with patient to avoid any fat or greasy foods. Discussed with patient to closely monitor symptoms and if symptoms are to worsen or change to report back to the ED - strict return instructions given.  Patient agreed to plan of care, understood, all questions answered.    Jamse Mead, PA-C 05/18/13 [phone]

## 2013-05-18 NOTE — ED Provider Notes (Signed)
Medical screening examination/treatment/procedure(s) were performed by non-physician practitioner and as supervising physician I was immediately available for consultation/collaboration.  EKG Interpretation    Date/Time:  Tuesday May 17 2013 15:31:28 EST Ventricular Rate:  91 PR Interval:  180 QRS Duration: 84 QT Interval:  364 QTC Calculation: 447 R Axis:   25 Text Interpretation:  Normal sinus rhythm Nonspecific T wave abnormality Abnormal ECG T waves in lateral leads slightly more prominent Confirmed by Martena Emanuele  MD, Harmonii Karle ([id number]) on 05/17/2013 3:56:14 PM              Ephraim Hamburger, MD 05/18/13 1104

## 2013-05-19 ENCOUNTER — Ambulatory Visit (INDEPENDENT_AMBULATORY_CARE_PROVIDER_SITE_OTHER): Payer: BC Managed Care – PPO | Admitting: Gastroenterology

## 2013-05-19 ENCOUNTER — Encounter: Payer: Self-pay | Admitting: Gastroenterology

## 2013-05-19 VITALS — BP 110/62 | HR 89 | Ht 64.0 in | Wt 135.2 lb

## 2013-05-19 DIAGNOSIS — Z1211 Encounter for screening for malignant neoplasm of colon: Secondary | ICD-10-CM | POA: Insufficient documentation

## 2013-05-19 DIAGNOSIS — R079 Chest pain, unspecified: Secondary | ICD-10-CM

## 2013-05-19 DIAGNOSIS — R1310 Dysphagia, unspecified: Secondary | ICD-10-CM | POA: Insufficient documentation

## 2013-05-19 NOTE — Assessment & Plan Note (Signed)
Chest pain is very likely do to esophageal reflux.  There may be a component of musculoskeletal pain as well.  Symptoms have clearly improved with Prevacid.  Recommendations #1 continue Prevacid #2 antacids when necessary pain

## 2013-05-19 NOTE — Assessment & Plan Note (Signed)
Last colonoscopy 7 years ago apparently was normal.  Plan followup colonoscopy in 3 years.

## 2013-05-19 NOTE — Progress Notes (Signed)
_                                                                                                                History of Present Illness: 63 year old Afro-American female referred from the ED for evaluation of chest pain.  She suffers from frequent pyrosis.  She was seen several days ago with spontaneous tightness in her chest.  Ischemia was ruled out by cardiac enzymes and EKG.  Patient was seen and assessed in November 2014 and admitted to the hospital regarding ongoing chest pain. Serial Troponins performed during admission with all troponins having no elevation. Echocardiogram performed in November 2014 with LVEF of 55-60%. Unremarkable findings on the echocardiogram.  Since taking Prevacid chest pains have subsided.  She complains of mild odynophagia and dysphagia to solids.  Patient apparently underwent colonoscopy 7 years ago which was normal.   Past Medical History  Diagnosis Date  . Retinal detachment     left eye prosthesis  . Scoliosis   . Hypertension   . Glaucoma   . Hyperlipidemia    Past Surgical History  Procedure Laterality Date  . Retinal detachment surgery    . Lipoma excision    . Tonsillectomy     family history includes Diabetes in her maternal grandmother; Hypertension in her mother; Lung cancer in her mother. Current Outpatient Prescriptions  Medication Sig Dispense Refill  . acetaminophen (TYLENOL) 500 MG tablet Take 1,000 mg by mouth every 6 (six) hours as needed for pain.      Marland Kitchen amLODipine (NORVASC) 10 MG tablet Take 10 mg by mouth daily.      Marland Kitchen atorvastatin (LIPITOR) 20 MG tablet Take 1 tablet (20 mg total) by mouth daily.  90 tablet  3  . cholecalciferol (VITAMIN D) 1000 UNITS tablet Take 1,000 Units by mouth daily.      . fluticasone (FLONASE) 50 MCG/ACT nasal spray Place 2 sprays into both nostrils daily.  16 g  6  . lansoprazole (PREVACID) 15 MG capsule Take 15 mg by mouth daily at 12 noon.      . latanoprost (XALATAN) 0.005 %  ophthalmic solution Place 2 drops into the right eye at bedtime.      . meloxicam (MOBIC) 7.5 MG tablet Take 2 tablets (15 mg total) by mouth daily.  30 tablet  0  . Multiple Vitamin (MULTIVITAMIN WITH MINERALS) TABS Take 1 tablet by mouth every morning.       . nicotine polacrilex (NICORETTE) 2 MG gum Take 2 mg by mouth as needed for smoking cessation.      . timolol (BETIMOL) 0.25 % ophthalmic solution Place 1 drop into the right eye 2 (two) times daily.        No current facility-administered medications for this visit.   Allergies as of 05/19/2013  . (No Known Allergies)    reports that she quit smoking about 2 months ago. Her smoking use included Cigarettes. She smoked 0.25 packs per day. She has never used smokeless tobacco. She reports  that she drinks alcohol. She reports that she does not use illicit drugs.     Review of Systems: She is frequent pain around her shoulders and upper extremities and back Pertinent positive and negative review of systems were noted in the above HPI section. All other review of systems were otherwise negative.  Vital signs were reviewed in today's medical record Physical Exam: General: Well developed , well nourished, no acute distress Skin: anicteric Head: Normocephalic and atraumatic Eyes:  sclerae anicteric, EOMI Ears: Normal auditory acuity Mouth: No deformity or lesions Neck: Supple, no masses or thyromegaly Lungs: Clear throughout to auscultation Heart: Regular rate and rhythm; no murmurs, rubs or bruits Abdomen: Soft, non tender and non distended. No masses, hepatosplenomegaly or hernias noted. Normal Bowel sounds Rectal:deferred Musculoskeletal: Symmetrical with no gross deformities  Skin: No lesions on visible extremities Pulses:  Normal pulses noted Extremities: No clubbing, cyanosis, edema or deformities noted Neurological: Alert oriented x 4, grossly nonfocal Cervical Nodes:  No significant cervical adenopathy Inguinal Nodes: No  significant inguinal adenopathy Psychological:  Alert and cooperative. Normal mood and affect  See Assessment and Plan under Problem List

## 2013-05-19 NOTE — Assessment & Plan Note (Signed)
Rule out early esophageal stricture  Recommendations #1 upper endoscopy with dilatation as indicated

## 2013-05-19 NOTE — Patient Instructions (Signed)

## 2013-05-20 ENCOUNTER — Encounter: Payer: Self-pay | Admitting: Gastroenterology

## 2013-05-26 ENCOUNTER — Other Ambulatory Visit: Payer: Self-pay | Admitting: Family Medicine

## 2013-05-27 NOTE — Telephone Encounter (Signed)
Med filled.  

## 2013-06-15 ENCOUNTER — Encounter: Payer: BC Managed Care – PPO | Admitting: Gastroenterology

## 2013-07-25 ENCOUNTER — Other Ambulatory Visit: Payer: Self-pay | Admitting: Family Medicine

## 2013-07-25 NOTE — Telephone Encounter (Signed)
Med filled.  

## 2013-08-04 ENCOUNTER — Ambulatory Visit (INDEPENDENT_AMBULATORY_CARE_PROVIDER_SITE_OTHER): Payer: BC Managed Care – PPO | Admitting: Family Medicine

## 2013-08-04 ENCOUNTER — Encounter: Payer: Self-pay | Admitting: Family Medicine

## 2013-08-04 VITALS — BP 118/82 | HR 88 | Temp 98.4°F | Resp 16 | Wt 136.4 lb

## 2013-08-04 DIAGNOSIS — E785 Hyperlipidemia, unspecified: Secondary | ICD-10-CM

## 2013-08-04 DIAGNOSIS — I10 Essential (primary) hypertension: Secondary | ICD-10-CM

## 2013-08-04 DIAGNOSIS — J309 Allergic rhinitis, unspecified: Secondary | ICD-10-CM

## 2013-08-04 NOTE — Assessment & Plan Note (Signed)
Deteriorated.  Continue nasal steroid daily, add OTC antihistamine.

## 2013-08-04 NOTE — Progress Notes (Signed)
   Subjective:    Patient ID: Janet Pierce, female    DOB: June 16, 1950, 63 y.o.   MRN: [id number]  HPI HTN- chronic problem, well controlled today.  Denies CP, SOB, HAs, visual changes, edema.  Seasonal allergies- waking w/ sore throat and AM hoarseness.  Started Flonase ~1 week ago w/ some improvement.  Hyperlipidemia- chronic problem, on Lipitor.  Denies abd pain, N/V, + myalgias.    Review of Systems For ROS see HPI     Objective:   Physical Exam  Vitals reviewed. Constitutional: She is oriented to person, place, and time. She appears well-developed and well-nourished. No distress.  HENT:  Head: Normocephalic and atraumatic.  Right Ear: Tympanic membrane normal.  Left Ear: Tympanic membrane normal.  Nose: Mucosal edema and rhinorrhea present. Right sinus exhibits no maxillary sinus tenderness and no frontal sinus tenderness. Left sinus exhibits no maxillary sinus tenderness and no frontal sinus tenderness.  Mouth/Throat: Mucous membranes are normal. Posterior oropharyngeal erythema (w/ PND) present.  Eyes: Conjunctivae and EOM are normal. Pupils are equal, round, and reactive to light.  Neck: Normal range of motion. Neck supple.  Cardiovascular: Normal rate, regular rhythm and normal heart sounds.   Pulmonary/Chest: Effort normal and breath sounds normal. No respiratory distress. She has no wheezes. She has no rales.  Abdominal: Soft. Bowel sounds are normal. She exhibits no distension. There is no tenderness. There is no rebound.  Musculoskeletal: She exhibits no edema.  Lymphadenopathy:    She has no cervical adenopathy.  Neurological: She is alert and oriented to person, place, and time.  Skin: Skin is warm and dry.          Assessment & Plan:

## 2013-08-04 NOTE — Patient Instructions (Signed)
Schedule your complete physical in 6 months We'll notify you of your lab results and make any changes if needed Use the Flonase every day!  Add Claritin or Zyrtec daily Keep up the good work!  You look great!

## 2013-08-04 NOTE — Assessment & Plan Note (Signed)
Chronic problem, tolerating statin w/o difficulty.  Check labs.  Adjust meds prn  

## 2013-08-04 NOTE — Assessment & Plan Note (Signed)
Chronic problem, well controlled.  Asymptomatic.  Check labs, no anticipated changes.

## 2013-08-04 NOTE — Progress Notes (Signed)
Pre visit review using our clinic review tool, if applicable. No additional management support is needed unless otherwise documented below in the visit note. 

## 2013-08-05 ENCOUNTER — Encounter: Payer: Self-pay | Admitting: General Practice

## 2013-08-05 LAB — HEPATIC FUNCTION PANEL
ALT: 25 U/L (ref 0–35)
AST: 26 U/L (ref 0–37)
Albumin: 4.3 g/dL (ref 3.5–5.2)
Alkaline Phosphatase: 57 U/L (ref 39–117)
BILIRUBIN TOTAL: 0.9 mg/dL (ref 0.3–1.2)
Bilirubin, Direct: 0.1 mg/dL (ref 0.0–0.3)
Total Protein: 7.7 g/dL (ref 6.0–8.3)

## 2013-08-05 LAB — CBC WITH DIFFERENTIAL/PLATELET
BASOS PCT: 0.6 % (ref 0.0–3.0)
Basophils Absolute: 0 10*3/uL (ref 0.0–0.1)
Eosinophils Absolute: 0.1 10*3/uL (ref 0.0–0.7)
Eosinophils Relative: 2.4 % (ref 0.0–5.0)
HEMATOCRIT: 36.5 % (ref 36.0–46.0)
HEMOGLOBIN: 12.1 g/dL (ref 12.0–15.0)
LYMPHS PCT: 44.2 % (ref 12.0–46.0)
Lymphs Abs: 2 10*3/uL (ref 0.7–4.0)
MCHC: 33 g/dL (ref 30.0–36.0)
MCV: 86 fl (ref 78.0–100.0)
MONO ABS: 0.5 10*3/uL (ref 0.1–1.0)
Monocytes Relative: 10.3 % (ref 3.0–12.0)
NEUTROS ABS: 1.9 10*3/uL (ref 1.4–7.7)
Neutrophils Relative %: 42.5 % — ABNORMAL LOW (ref 43.0–77.0)
Platelets: 287 10*3/uL (ref 150.0–400.0)
RBC: 4.25 Mil/uL (ref 3.87–5.11)
RDW: 13.5 % (ref 11.5–14.6)
WBC: 4.5 10*3/uL (ref 4.5–10.5)

## 2013-08-05 LAB — BASIC METABOLIC PANEL
BUN: 12 mg/dL (ref 6–23)
CALCIUM: 9.4 mg/dL (ref 8.4–10.5)
CO2: 31 mEq/L (ref 19–32)
Chloride: 102 mEq/L (ref 96–112)
Creatinine, Ser: 0.6 mg/dL (ref 0.4–1.2)
GFR: 132.67 mL/min (ref >60.00)
Glucose, Bld: 76 mg/dL (ref 70–99)
Potassium: 3.5 mEq/L (ref 3.5–5.1)
SODIUM: 142 meq/L (ref 135–145)

## 2013-08-05 LAB — LIPID PANEL
CHOL/HDL RATIO: 3
Cholesterol: 146 mg/dL (ref 0–200)
HDL: 57.9 mg/dL (ref >39.00)
LDL Cholesterol: 70 mg/dL (ref 0–99)
Triglycerides: 89 mg/dL (ref 0.0–149.0)
VLDL: 17.8 mg/dL (ref 0.0–40.0)

## 2013-08-05 LAB — TSH: TSH: 1.42 u[IU]/mL (ref 0.35–5.50)

## 2013-08-23 ENCOUNTER — Other Ambulatory Visit: Payer: Self-pay | Admitting: Family Medicine

## 2013-08-23 NOTE — Telephone Encounter (Signed)
Med filled.  

## 2013-09-26 ENCOUNTER — Other Ambulatory Visit: Payer: Self-pay | Admitting: Family Medicine

## 2013-09-26 NOTE — Telephone Encounter (Signed)
Med filled.  

## 2013-11-25 ENCOUNTER — Telehealth: Payer: Self-pay | Admitting: Family Medicine

## 2013-11-25 ENCOUNTER — Ambulatory Visit (INDEPENDENT_AMBULATORY_CARE_PROVIDER_SITE_OTHER): Payer: BC Managed Care – PPO | Admitting: Family Medicine

## 2013-11-25 ENCOUNTER — Encounter: Payer: Self-pay | Admitting: General Practice

## 2013-11-25 ENCOUNTER — Encounter: Payer: Self-pay | Admitting: Family Medicine

## 2013-11-25 VITALS — BP 122/82 | HR 90 | Temp 98.0°F | Resp 17 | Wt 134.1 lb

## 2013-11-25 DIAGNOSIS — J3089 Other allergic rhinitis: Secondary | ICD-10-CM

## 2013-11-25 DIAGNOSIS — Z720 Tobacco use: Secondary | ICD-10-CM

## 2013-11-25 DIAGNOSIS — J302 Other seasonal allergic rhinitis: Secondary | ICD-10-CM

## 2013-11-25 DIAGNOSIS — R5383 Other fatigue: Secondary | ICD-10-CM

## 2013-11-25 DIAGNOSIS — R5381 Other malaise: Secondary | ICD-10-CM

## 2013-11-25 DIAGNOSIS — F17200 Nicotine dependence, unspecified, uncomplicated: Secondary | ICD-10-CM

## 2013-11-25 DIAGNOSIS — M4120 Other idiopathic scoliosis, site unspecified: Secondary | ICD-10-CM

## 2013-11-25 LAB — CBC WITH DIFFERENTIAL/PLATELET
BASOS ABS: 0 10*3/uL (ref 0.0–0.1)
Basophils Relative: 0.7 % (ref 0.0–3.0)
EOS PCT: 1.9 % (ref 0.0–5.0)
Eosinophils Absolute: 0.1 10*3/uL (ref 0.0–0.7)
HCT: 34.9 % — ABNORMAL LOW (ref 36.0–46.0)
Hemoglobin: 11.7 g/dL — ABNORMAL LOW (ref 12.0–15.0)
Lymphocytes Relative: 44.4 % (ref 12.0–46.0)
Lymphs Abs: 1.7 10*3/uL (ref 0.7–4.0)
MCHC: 33.5 g/dL (ref 30.0–36.0)
MCV: 85.3 fl (ref 78.0–100.0)
MONO ABS: 0.4 10*3/uL (ref 0.1–1.0)
Monocytes Relative: 9.1 % (ref 3.0–12.0)
NEUTROS PCT: 43.9 % (ref 43.0–77.0)
Neutro Abs: 1.7 10*3/uL (ref 1.4–7.7)
PLATELETS: 304 10*3/uL (ref 150.0–400.0)
RBC: 4.09 Mil/uL (ref 3.87–5.11)
RDW: 13.3 % (ref 11.5–15.5)
WBC: 3.9 10*3/uL — ABNORMAL LOW (ref 4.0–10.5)

## 2013-11-25 LAB — TSH: TSH: 1.54 u[IU]/mL (ref 0.35–4.50)

## 2013-11-25 LAB — BASIC METABOLIC PANEL
BUN: 10 mg/dL (ref 6–23)
CHLORIDE: 101 meq/L (ref 96–112)
CO2: 31 mEq/L (ref 19–32)
CREATININE: 0.6 mg/dL (ref 0.4–1.2)
Calcium: 9.3 mg/dL (ref 8.4–10.5)
GFR: 120.66 mL/min (ref >60.00)
Glucose, Bld: 80 mg/dL (ref 70–99)
Potassium: 3.3 mEq/L — ABNORMAL LOW (ref 3.5–5.1)
Sodium: 138 mEq/L (ref 135–145)

## 2013-11-25 MED ORDER — LORATADINE 10 MG PO TABS: 10.0000 mg | ORAL_TABLET | Freq: Every day | ORAL | Status: DC

## 2013-11-25 MED ORDER — FLUTICASONE PROPIONATE 50 MCG/ACT NA SUSP: 2.0000 | Freq: Every day | NASAL | Status: DC

## 2013-11-25 NOTE — Assessment & Plan Note (Signed)
Deteriorated.  Likely contributing to pt's foggy feeling and fatigue.  Start daily OTC antihistamine and encouraged her to start nasal steroid.  Reviewed supportive care and red flags that should prompt return.  Pt expressed understanding and is in agreement w/ plan.

## 2013-11-25 NOTE — Telephone Encounter (Signed)
Med filled.  

## 2013-11-25 NOTE — Progress Notes (Signed)
   Subjective:    Patient ID: Janet Pierce, female    DOB: 28-Jan-1951, 63 y.o.   MRN: [id number]  HPI Fatigue- pt reports feeling better today but 'my head has just been in a fog'.  + fatigue, hard to focus.  Pt is attempting to quit smoking- has not smoked in 8 days.  On Nicorette.  Pt restarted nasal steroid 2 days ago.  + sinus congestion, PND.  Pt reports some shoulder tightness and rounded posture which is upsetting to her.   Review of Systems For ROS see HPI     Objective:   Physical Exam  Vitals reviewed. Constitutional: She is oriented to person, place, and time. She appears well-developed and well-nourished. No distress.  HENT:  Head: Normocephalic and atraumatic.  Right Ear: Tympanic membrane normal.  Left Ear: Tympanic membrane normal.  Nose: Mucosal edema and rhinorrhea present. Right sinus exhibits no maxillary sinus tenderness and no frontal sinus tenderness. Left sinus exhibits no maxillary sinus tenderness and no frontal sinus tenderness.  Mouth/Throat: Mucous membranes are normal. Posterior oropharyngeal erythema (w/ PND) present.  Eyes: Conjunctivae and EOM are normal. Pupils are equal, round, and reactive to light.  Neck: Normal range of motion. Neck supple.  Cardiovascular: Normal rate, regular rhythm and normal heart sounds.   Pulmonary/Chest: Effort normal and breath sounds normal. No respiratory distress. She has no wheezes. She has no rales.  Musculoskeletal: She exhibits no tenderness.  Rounded shoulders, poor posture  Lymphadenopathy:    She has no cervical adenopathy.  Neurological: She is alert and oriented to person, place, and time.  Skin: Skin is warm and dry.  Psychiatric: She has a normal mood and affect. Her behavior is normal. Thought content normal.          Assessment & Plan:

## 2013-11-25 NOTE — Assessment & Plan Note (Signed)
Chronic problem.  Pt is upset w/ rounded shoulders and poor posture.  Recommended she see [facility]

## 2013-11-25 NOTE — Assessment & Plan Note (Signed)
Pt last smoked 8 days ago and is on Nicorette gum, attempting to quit.  Applauded her efforts.  Suspect her fatigue and foggy feeling is due to nicotine w/drawal.  Will follow.

## 2013-11-25 NOTE — Assessment & Plan Note (Signed)
Recurring problem for pt.  Suspect this is combo of untreated allergies and nicotine withdraw.  Pt's sxs are already resolving w/o intervention.  Check labs to r/o metabolic causes.  Encouraged healthy diet, regular exercise.  Will follow.

## 2013-11-25 NOTE — Telephone Encounter (Signed)
Ok for prescription, pt had been advised earlier to get OTC.

## 2013-11-25 NOTE — Patient Instructions (Signed)
Follow up as scheduled for your complete physical We'll notify you of your lab results and make any changes if needed Start Claritin or Zyrtec daily for seasonal allergies- take until the first frost and restart again in the spring Restart the Flonase- 2 sprays each nostril daily Drink plenty of fluids Call [place] for an adjustment Call with any questions or concerns Enjoy the rest of your summer!

## 2013-11-25 NOTE — Progress Notes (Signed)
Pre visit review using our clinic review tool, if applicable. No additional management support is needed unless otherwise documented below in the visit note. 

## 2013-11-25 NOTE — Telephone Encounter (Signed)
Ok for Claritin 10mg  daily.  #30, 6 refills

## 2013-11-25 NOTE — Telephone Encounter (Signed)
°  Caller name: Aniston Christman Relation to pt: self  Call back number: [phone]   Reason for call: pt is requesting a script for claritin please send to pharmacy.

## 2014-01-11 ENCOUNTER — Encounter: Payer: BC Managed Care – PPO | Admitting: Family Medicine

## 2014-01-20 ENCOUNTER — Other Ambulatory Visit: Payer: Self-pay | Admitting: Family Medicine

## 2014-01-20 NOTE — Telephone Encounter (Signed)
Last OV: 11/25/13 Labs: 11/25/13  Med refilled x 6 mths.

## 2014-01-20 NOTE — Telephone Encounter (Signed)
Refill 6 months 

## 2014-01-30 ENCOUNTER — Ambulatory Visit: Payer: BC Managed Care – PPO | Admitting: Family Medicine

## 2014-01-31 ENCOUNTER — Encounter: Payer: BC Managed Care – PPO | Admitting: Family Medicine

## 2014-02-20 ENCOUNTER — Encounter: Payer: Self-pay | Admitting: Family Medicine

## 2014-03-26 ENCOUNTER — Ambulatory Visit (INDEPENDENT_AMBULATORY_CARE_PROVIDER_SITE_OTHER): Payer: BC Managed Care – PPO | Admitting: Emergency Medicine

## 2014-03-26 ENCOUNTER — Ambulatory Visit (INDEPENDENT_AMBULATORY_CARE_PROVIDER_SITE_OTHER): Payer: BC Managed Care – PPO

## 2014-03-26 VITALS — BP 124/72 | HR 75 | Temp 99.0°F | Resp 12 | Ht 64.25 in | Wt 137.5 lb

## 2014-03-26 DIAGNOSIS — M542 Cervicalgia: Secondary | ICD-10-CM

## 2014-03-26 DIAGNOSIS — S161XXA Strain of muscle, fascia and tendon at neck level, initial encounter: Secondary | ICD-10-CM

## 2014-03-26 MED ORDER — ACETAMINOPHEN-CODEINE #3 300-30 MG PO TABS: 1.0000 | ORAL_TABLET | ORAL | Status: DC | PRN

## 2014-03-26 MED ORDER — CYCLOBENZAPRINE HCL 10 MG PO TABS: 10.0000 mg | ORAL_TABLET | Freq: Three times a day (TID) | ORAL | Status: DC | PRN

## 2014-03-26 MED ORDER — NAPROXEN SODIUM 550 MG PO TABS: 550.0000 mg | ORAL_TABLET | Freq: Two times a day (BID) | ORAL | Status: DC

## 2014-03-26 NOTE — Patient Instructions (Signed)
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
## 2014-03-31 ENCOUNTER — Telehealth: Payer: Self-pay

## 2014-03-31 NOTE — Telephone Encounter (Signed)
Pt needs an appt for evaluation. Per OV note from [place] she was told she has a cervical spine strain and it will take 1-3 weeks to heal.

## 2014-03-31 NOTE — Telephone Encounter (Signed)
Called pt back made an appointment for 04/05/14
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
## 2014-04-05 ENCOUNTER — Ambulatory Visit (INDEPENDENT_AMBULATORY_CARE_PROVIDER_SITE_OTHER): Payer: BC Managed Care – PPO | Admitting: Family Medicine

## 2014-04-05 ENCOUNTER — Encounter: Payer: Self-pay | Admitting: Family Medicine

## 2014-04-05 DIAGNOSIS — M62838 Other muscle spasm: Secondary | ICD-10-CM

## 2014-04-05 DIAGNOSIS — S40012A Contusion of left shoulder, initial encounter: Secondary | ICD-10-CM

## 2014-04-05 DIAGNOSIS — S1093XA Contusion of unspecified part of neck, initial encounter: Secondary | ICD-10-CM

## 2014-04-05 NOTE — Patient Instructions (Signed)
Follow up as needed Start the Naproxen twice daily- take w/ food- for the next 7-10 days and then as needed Use the Flexeril before bed for the next week or so and then as needed for spasm HEAT! Gentle stretching to avoid stiffness If still having tightness or discomfort after the new year, call Raynelle Chary for an appt Call with any questions or concerns Happy Holidays!

## 2014-04-05 NOTE — Progress Notes (Signed)
Pre visit review using our clinic review tool, if applicable. No additional management support is needed unless otherwise documented below in the visit note. 
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
## 2014-04-06 ENCOUNTER — Other Ambulatory Visit: Payer: Self-pay | Admitting: Family Medicine

## 2014-04-06 NOTE — Telephone Encounter (Signed)
Med filled.  

## 2014-04-09 NOTE — Assessment & Plan Note (Signed)
New.  Pt's pain is due to muscular spasm and bruising- no obvious bony deformity and has already had negative xrays.  Encouraged her to take Flexeril and NSAIDs as directed.  Alternate heat/ice.  Reviewed need for gentle stretching to avoid stiffness.  If no improvement after the holidays, encouraged her to to call [place].  Pt expressed understanding and is in agreement w/ plan.

## 2014-04-10 ENCOUNTER — Telehealth: Payer: Self-pay | Admitting: *Deleted

## 2014-04-10 NOTE — Telephone Encounter (Signed)
Received medical record request via mail from [place] forwarded to health port. JG//[id number]

## 2014-04-26 ENCOUNTER — Telehealth: Payer: Self-pay | Admitting: Emergency Medicine

## 2014-04-26 ENCOUNTER — Telehealth: Payer: Self-pay | Admitting: Radiology

## 2014-04-26 NOTE — Telephone Encounter (Signed)
At the patient's request I made a copy of her CSP x-ray, informed her , and put the CD in the pick up  Drawer.
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
## 2014-04-26 NOTE — Telephone Encounter (Signed)
To x-ray

## 2014-04-27 ENCOUNTER — Telehealth: Payer: Self-pay | Admitting: Family Medicine

## 2014-04-27 NOTE — Telephone Encounter (Signed)
Noted will watch out for this. Message Routed to [facility].

## 2014-04-27 NOTE — Telephone Encounter (Signed)
Printed message and given to Xray for pt to pick up. Pt still needs to be called- notified Xray

## 2014-04-27 NOTE — Telephone Encounter (Signed)
Caller name: Alyzza Relation to pt: self Call back number: [phone] Pharmacy:  Reason for call:   Patient needs to get FMLA paperwork from her human resources department. She states that she does not have specific dates as of yet. I informed patient that she can drop of paperwork.

## 2014-05-31 ENCOUNTER — Telehealth: Payer: Self-pay | Admitting: *Deleted

## 2014-05-31 NOTE — Telephone Encounter (Signed)
Pre-Visit Call:   Pap- unknown  CCS- unknown Mmg- 01/31/13 with Margarette Canada, MD at [facility]: Neg BD- 01/23/06 with Hulan Fess- normal, f/u 2 years   Flu- Td-  Zoster-  No immunization records found.   Concerns: Patient unavailable at time of call. Left message to call office with no return call.

## 2014-06-01 ENCOUNTER — Ambulatory Visit (INDEPENDENT_AMBULATORY_CARE_PROVIDER_SITE_OTHER): Payer: BLUE CROSS/BLUE SHIELD | Admitting: Family Medicine

## 2014-06-01 ENCOUNTER — Encounter: Payer: Self-pay | Admitting: General Practice

## 2014-06-01 ENCOUNTER — Other Ambulatory Visit (HOSPITAL_COMMUNITY)
Admission: RE | Admit: 2014-06-01 | Discharge: 2014-06-01 | Disposition: A | Discharge status: Home / Self Care | Payer: BLUE CROSS/BLUE SHIELD | Source: Ambulatory Visit | Attending: Family Medicine | Admitting: Family Medicine

## 2014-06-01 ENCOUNTER — Encounter: Payer: Self-pay | Admitting: Family Medicine

## 2014-06-01 VITALS — BP 126/72 | HR 86 | Temp 98.2°F | Resp 16 | Ht 64.0 in | Wt 139.4 lb

## 2014-06-01 DIAGNOSIS — Z01419 Encounter for gynecological examination (general) (routine) without abnormal findings: Secondary | ICD-10-CM | POA: Insufficient documentation

## 2014-06-01 DIAGNOSIS — M21611 Bunion of right foot: Secondary | ICD-10-CM | POA: Insufficient documentation

## 2014-06-01 DIAGNOSIS — Z124 Encounter for screening for malignant neoplasm of cervix: Secondary | ICD-10-CM

## 2014-06-01 DIAGNOSIS — Z1231 Encounter for screening mammogram for malignant neoplasm of breast: Secondary | ICD-10-CM

## 2014-06-01 DIAGNOSIS — Z780 Asymptomatic menopausal state: Secondary | ICD-10-CM

## 2014-06-01 DIAGNOSIS — Z1151 Encounter for screening for human papillomavirus (HPV): Secondary | ICD-10-CM | POA: Diagnosis present

## 2014-06-01 DIAGNOSIS — M2011 Hallux valgus (acquired), right foot: Secondary | ICD-10-CM

## 2014-06-01 DIAGNOSIS — Z0000 Encounter for general adult medical examination without abnormal findings: Secondary | ICD-10-CM

## 2014-06-01 LAB — LIPID PANEL
CHOLESTEROL: 158 mg/dL (ref 0–200)
HDL: 49.5 mg/dL (ref >39.00)
LDL Cholesterol: 92 mg/dL (ref 0–99)
NONHDL: 108.5
Total CHOL/HDL Ratio: 3
Triglycerides: 84 mg/dL (ref 0.0–149.0)
VLDL: 16.8 mg/dL (ref 0.0–40.0)

## 2014-06-01 LAB — CBC WITH DIFFERENTIAL/PLATELET
BASOS ABS: 0 10*3/uL (ref 0.0–0.1)
Basophils Relative: 0.8 % (ref 0.0–3.0)
EOS ABS: 0.1 10*3/uL (ref 0.0–0.7)
Eosinophils Relative: 2.3 % (ref 0.0–5.0)
HEMATOCRIT: 33.8 % — AB (ref 36.0–46.0)
Hemoglobin: 11.4 g/dL — ABNORMAL LOW (ref 12.0–15.0)
LYMPHS ABS: 1.2 10*3/uL (ref 0.7–4.0)
Lymphocytes Relative: 32 % (ref 12.0–46.0)
MCHC: 33.8 g/dL (ref 30.0–36.0)
MCV: 83.9 fl (ref 78.0–100.0)
MONOS PCT: 10.9 % (ref 3.0–12.0)
Monocytes Absolute: 0.4 10*3/uL (ref 0.1–1.0)
Neutro Abs: 2 10*3/uL (ref 1.4–7.7)
Neutrophils Relative %: 54 % (ref 43.0–77.0)
PLATELETS: 282 10*3/uL (ref 150.0–400.0)
RBC: 4.03 Mil/uL (ref 3.87–5.11)
RDW: 12.7 % (ref 11.5–15.5)
WBC: 3.8 10*3/uL — ABNORMAL LOW (ref 4.0–10.5)

## 2014-06-01 LAB — BASIC METABOLIC PANEL
BUN: 11 mg/dL (ref 6–23)
CHLORIDE: 102 meq/L (ref 96–112)
CO2: 32 meq/L (ref 19–32)
CREATININE: 0.65 mg/dL (ref 0.40–1.20)
Calcium: 9.4 mg/dL (ref 8.4–10.5)
GFR: 118.32 mL/min (ref >60.00)
Glucose, Bld: 91 mg/dL (ref 70–99)
POTASSIUM: 3.6 meq/L (ref 3.5–5.1)
Sodium: 139 mEq/L (ref 135–145)

## 2014-06-01 LAB — VITAMIN D 25 HYDROXY (VIT D DEFICIENCY, FRACTURES): VITD: 36.08 ng/mL (ref 30.00–100.00)

## 2014-06-01 LAB — HEPATIC FUNCTION PANEL
ALT: 38 U/L — ABNORMAL HIGH (ref 0–35)
AST: 24 U/L (ref 0–37)
Albumin: 4.1 g/dL (ref 3.5–5.2)
Alkaline Phosphatase: 63 U/L (ref 39–117)
BILIRUBIN DIRECT: 0.1 mg/dL (ref 0.0–0.3)
BILIRUBIN TOTAL: 0.6 mg/dL (ref 0.2–1.2)
Total Protein: 7.1 g/dL (ref 6.0–8.3)

## 2014-06-01 LAB — TSH: TSH: 2.28 u[IU]/mL (ref 0.35–4.50)

## 2014-06-01 NOTE — Assessment & Plan Note (Signed)
Pt's PE WNL w/ exception of bunion and nail fungus.  Pap collected today.  mammo and dexa ordered.  Check labs.  Anticipatory guidance provided.

## 2014-06-01 NOTE — Assessment & Plan Note (Signed)
New.  Refer to podiatry.  Pt expressed understanding and is in agreement w/ plan.

## 2014-06-01 NOTE — Progress Notes (Signed)
Pre visit review using our clinic review tool, if applicable. No additional management support is needed unless otherwise documented below in the visit note. 

## 2014-06-01 NOTE — Progress Notes (Signed)
   Subjective:    Patient ID: Janet Pierce, female    DOB: [birth year], 64 y.o.   MRN: [id number]  HPI CPE- due for DEXA, mammo, pap.  UTD on colonoscopy.   Review of Systems Patient reports no vision/ hearing changes, adenopathy,fever, weight change,  persistant/recurrent hoarseness , swallowing issues, chest pain, palpitations, edema, persistant/recurrent cough, hemoptysis, dyspnea (rest/exertional/paroxysmal nocturnal), gastrointestinal bleeding (melena, rectal bleeding), abdominal pain, significant heartburn, bowel changes, GU symptoms (dysuria, hematuria, incontinence), Gyn symptoms (abnormal  bleeding, pain),  syncope, focal weakness, memory loss, numbness & tingling, skin/hair changes, abnormal bruising or bleeding, anxiety, or depression.   Reviewed meds, allergies, problem list, and PMH in chart  + toenail fungus bilaterally Bunion on R  Reviewed meds, allergies, problem list, and PMH in chart     Objective:   Physical Exam  General Appearance:    Alert, cooperative, no distress, appears stated age  Head:    Normocephalic, without obvious abnormality, atraumatic  Eyes:    PERRL, conjunctiva/corneas clear, EOM's intact, fundi    benign, both eyes  Ears:    Normal TM's and external ear canals, both ears  Nose:   Nares normal, septum midline, mucosa normal, no drainage    or sinus tenderness  Throat:   Lips, mucosa, and tongue normal; teeth and gums normal  Neck:   Supple, symmetrical, trachea midline, no adenopathy;    Thyroid: no enlargement/tenderness/nodules  Back:     Symmetric, no curvature, ROM normal, no CVA tenderness  Lungs:     Clear to auscultation bilaterally, respirations unlabored  Chest Wall:    No tenderness or deformity   Heart:    Regular rate and rhythm, S1 and S2 normal, no murmur, rub   or gallop  Breast Exam:    No tenderness, masses, or nipple abnormality  Abdomen:     Soft, non-tender, bowel sounds active all four quadrants,    no masses, no  organomegaly  Genitalia:    External genitalia normal, cervix normal in appearance, no CMT, uterus in normal size and position, adnexa w/out mass or tenderness, mucosa pink and moist, no lesions or discharge present  Rectal:    Normal external appearance  Extremities:   Extremities normal, atraumatic, no cyanosis or edema.  Known lipoma on R wrist.  Bunion of R foot.  Pulses:   2+ and symmetric all extremities  Skin:   Skin color, texture, turgor normal, no rashes or lesions.  Bilateral toenail fungus  Lymph nodes:   Cervical, supraclavicular, and axillary nodes normal  Neurologic:   CNII-XII intact, normal strength, sensation and reflexes    throughout          Assessment & Plan:

## 2014-06-01 NOTE — Patient Instructions (Signed)
Follow up in 6 months to recheck cholesterol We'll notify you of your lab results and make any changes if needed We'll call you with your mammogram and bone density appts We'll call you with your podiatry appt Continue to make healthy food choices and get regular exercise Call with any questions or concerns Happy Valentine's Day!

## 2014-06-05 ENCOUNTER — Encounter: Payer: Self-pay | Admitting: General Practice

## 2014-06-05 LAB — CYTOLOGY - PAP

## 2014-06-09 ENCOUNTER — Telehealth: Payer: Self-pay | Admitting: Family Medicine

## 2014-06-09 NOTE — Telephone Encounter (Signed)
Patient Name: Janet Pierce  DOB: 1951-01-16    Initial Comment Caller states woke up with double vision , has been having frequent BM , gassy and stomach is bloated    Nurse Assessment  Nurse: Mallie Mussel, RN, Alveta Heimlich Date/Time (Eastern Time): 06/09/2014 8:07:04 AM  Confirm and document reason for call. If symptomatic, describe symptoms. ---Caller states that she woke up this morning with double vision. She has put her contacts in and it is subsiding. This is the first time this has happened to her. She has been having frequent BM, she states at least 3 times a day, the stool is soft and formed, but not diarrhea. She has had gas and feels bloated. She was seen last Thursday and was advised to take anti-diarrheal OTC. Denies abdominal pain. Denies vomiting and fever. Her stomach is her main concern.  Has the patient traveled out of the country within the last 30 days? ---No  Does the patient require triage? ---Yes  Related visit to physician within the last 2 weeks? ---Yes  Does the PT have any chronic conditions? (i.e. diabetes, asthma, etc.) ---Yes  List chronic conditions. ---Hypercholesterolemia, HTN, Glaucoma     Guidelines    Guideline Title Affirmed Question Affirmed Notes       Final Disposition User        Comments  Caller advised to maybe try some anti-gas medication, Simethicone, to help with the gas and bloating. She told me that while I had her on hold earlier, her eye doctor had called back and made her an appointment to be seen.

## 2014-06-09 NOTE — Telephone Encounter (Signed)
Refer to [facility] nurse note

## 2014-07-03 ENCOUNTER — Other Ambulatory Visit: Payer: Self-pay | Admitting: Family Medicine

## 2014-07-07 DIAGNOSIS — Z0271 Encounter for disability determination: Secondary | ICD-10-CM

## 2014-07-15 ENCOUNTER — Emergency Department (HOSPITAL_COMMUNITY)
Admission: EM | Admit: 2014-07-15 | Discharge: 2014-07-16 | Disposition: A | Discharge status: Home / Self Care | Payer: BLUE CROSS/BLUE SHIELD | Attending: Emergency Medicine | Admitting: Emergency Medicine

## 2014-07-15 ENCOUNTER — Encounter (HOSPITAL_COMMUNITY): Payer: Self-pay

## 2014-07-15 ENCOUNTER — Emergency Department (HOSPITAL_COMMUNITY): Payer: BLUE CROSS/BLUE SHIELD

## 2014-07-15 DIAGNOSIS — I10 Essential (primary) hypertension: Secondary | ICD-10-CM | POA: Insufficient documentation

## 2014-07-15 DIAGNOSIS — Z7951 Long term (current) use of inhaled steroids: Secondary | ICD-10-CM | POA: Diagnosis not present

## 2014-07-15 DIAGNOSIS — M1990 Unspecified osteoarthritis, unspecified site: Secondary | ICD-10-CM | POA: Insufficient documentation

## 2014-07-15 DIAGNOSIS — Z8639 Personal history of other endocrine, nutritional and metabolic disease: Secondary | ICD-10-CM | POA: Diagnosis not present

## 2014-07-15 DIAGNOSIS — Z79899 Other long term (current) drug therapy: Secondary | ICD-10-CM | POA: Diagnosis not present

## 2014-07-15 DIAGNOSIS — R079 Chest pain, unspecified: Secondary | ICD-10-CM | POA: Diagnosis not present

## 2014-07-15 DIAGNOSIS — Z87891 Personal history of nicotine dependence: Secondary | ICD-10-CM | POA: Insufficient documentation

## 2014-07-15 DIAGNOSIS — R12 Heartburn: Secondary | ICD-10-CM | POA: Insufficient documentation

## 2014-07-15 DIAGNOSIS — H409 Unspecified glaucoma: Secondary | ICD-10-CM | POA: Diagnosis not present

## 2014-07-15 LAB — CBC
HCT: 34.3 % — ABNORMAL LOW (ref 36.0–46.0)
Hemoglobin: 11.5 g/dL — ABNORMAL LOW (ref 12.0–15.0)
MCH: 28.3 pg (ref 26.0–34.0)
MCHC: 33.5 g/dL (ref 30.0–36.0)
MCV: 84.3 fL (ref 78.0–100.0)
PLATELETS: 292 10*3/uL (ref 150–400)
RBC: 4.07 MIL/uL (ref 3.87–5.11)
RDW: 12.6 % (ref 11.5–15.5)
WBC: 5 10*3/uL (ref 4.0–10.5)

## 2014-07-15 LAB — BASIC METABOLIC PANEL
ANION GAP: 8 (ref 5–15)
BUN: 12 mg/dL (ref 6–23)
CALCIUM: 9.4 mg/dL (ref 8.4–10.5)
CHLORIDE: 101 mmol/L (ref 96–112)
CO2: 29 mmol/L (ref 19–32)
CREATININE: 0.67 mg/dL (ref 0.50–1.10)
GFR calc Af Amer: >90 mL/min (ref >90)
GFR calc non Af Amer: >90 mL/min (ref >90)
Glucose, Bld: 101 mg/dL — ABNORMAL HIGH (ref 70–99)
Potassium: 3.4 mmol/L — ABNORMAL LOW (ref 3.5–5.1)
Sodium: 138 mmol/L (ref 135–145)

## 2014-07-15 LAB — I-STAT TROPONIN, ED: Troponin i, poc: 0 ng/mL (ref 0.00–0.08)

## 2014-07-15 LAB — LIPASE, BLOOD: Lipase: 22 U/L (ref 11–59)

## 2014-07-15 MED ORDER — GI COCKTAIL ~~LOC~~
30.0000 mL | Freq: Once | ORAL | Status: AC
  Administered 2014-07-15: 30 mL via ORAL
  Filled 2014-07-15: qty 30

## 2014-07-15 MED ORDER — PANTOPRAZOLE SODIUM 20 MG PO TBEC: 40.0000 mg | DELAYED_RELEASE_TABLET | Freq: Two times a day (BID) | ORAL | Status: DC

## 2014-07-15 NOTE — Discharge Instructions (Signed)

## 2014-07-15 NOTE — ED Provider Notes (Signed)
CSN: [id number]     Arrival date & time 07/15/14  2126 History   First MD Initiated Contact with Patient 07/15/14 2131     Chief Complaint  Patient presents with  . Chest Pain     (Consider location/radiation/quality/duration/timing/severity/associated sxs/prior Treatment) Patient is a 64 y.o. female presenting with chest pain. The history is provided by the patient.  Chest Pain Pain location:  Substernal area and epigastric Pain quality: pressure   Pain radiates to:  Does not radiate Pain severity:  Mild Onset quality:  Gradual Duration:  2 weeks Timing:  Constant Progression:  Waxing and waning Chronicity:  Recurrent Context: at rest   Relieved by:  Nothing Worsened by:  Nothing tried Ineffective treatments: Prilosec. Associated symptoms: no abdominal pain, no cough, no fever, no nausea and not vomiting   Associated symptoms comment:  Belching   Past Medical History  Diagnosis Date  . Retinal detachment     left eye prosthesis  . Scoliosis   . Hypertension   . Glaucoma   . Hyperlipidemia   . Arthritis    Past Surgical History  Procedure Laterality Date  . Retinal detachment surgery    . Lipoma excision    . Tonsillectomy    . Eye surgery     Family History  Problem Relation Age of Onset  . Lung cancer Mother   . Hypertension Mother   . Cancer Mother   . Diabetes Maternal Grandmother    History  Substance Use Topics  . Smoking status: Former Smoker -- 0.25 packs/day    Types: Cigarettes    Quit date: 03/04/2013  . Smokeless tobacco: Never Used     Comment: using nicorette gum  . Alcohol Use: Yes     Comment: occasionally   OB History    Gravida Para Term Preterm AB TAB SAB Ectopic Multiple Living   2 2              Obstetric Comments   Daughters in [place], [place]     Review of Systems  Constitutional: Negative for fever.  Respiratory: Negative for cough.   Cardiovascular: Positive for chest pain.  Gastrointestinal: Negative for nausea,  vomiting and abdominal pain.  All other systems reviewed and are negative.     Allergies  Review of patient's allergies indicates no known allergies.  Home Medications   Prior to Admission medications   Medication Sig Start Date End Date Taking? Authorizing Provider  acetaminophen (TYLENOL) 500 MG tablet Take 500 mg by mouth every 6 (six) hours as needed for mild pain or moderate pain.   Yes Historical Provider, MD  amLODipine (NORVASC) 10 MG tablet TAKE 1 TABLET BY MOUTH EVERY DAY BEFORE BREAKFAST 01/20/14  Yes Yvonne R Lowne, DO  atorvastatin (LIPITOR) 20 MG tablet TAKE 1 TABLET BY MOUTH EVERY DAY 07/03/14  Yes Midge Minium, MD  bimatoprost (LUMIGAN) 0.01 % SOLN Place 1 drop into both eyes at bedtime.   Yes Historical Provider, MD  cholecalciferol (VITAMIN D) 1000 UNITS tablet Take 1,000 Units by mouth daily.   Yes Historical Provider, MD  co-enzyme Q-10 30 MG capsule Take 30 mg by mouth daily.   Yes Historical Provider, MD  fluticasone (FLONASE) 50 MCG/ACT nasal spray Place 2 sprays into both nostrils daily. 11/25/13  Yes Midge Minium, MD  Multiple Vitamin (MULTIVITAMIN WITH MINERALS) TABS Take 2 tablets by mouth daily.    Yes Historical Provider, MD  naproxen (NAPROSYN) 500 MG tablet Take 500 mg by mouth  as needed for mild pain or moderate pain.  05/18/14  Yes Historical Provider, MD  nicotine polacrilex (NICORETTE) 2 MG gum Take 2 mg by mouth as needed for smoking cessation.   Yes Historical Provider, MD  SIMBRINZA 1-0.2 % SUSP Place 1 drop into the right eye 2 (two) times daily.  11/02/13  Yes Historical Provider, MD  timolol (TIMOPTIC) 0.5 % ophthalmic solution Place 1 drop into the right eye 2 (two) times daily.  09/13/13  Yes Historical Provider, MD  naproxen sodium (ANAPROX DS) 550 MG tablet Take 1 tablet (550 mg total) by mouth 2 (two) times daily with a meal. Patient not taking: Reported on 06/01/2014 03/26/14 03/26/15  Roselee Culver, MD   BP 132/79 mmHg  Pulse 79   Temp(Src) 98.4 F (36.9 C)  Resp 23  SpO2 97% Physical Exam  Constitutional: She is oriented to person, place, and time. She appears well-developed and well-nourished. No distress.  HENT:  Head: Normocephalic and atraumatic.  Mouth/Throat: Oropharynx is clear and moist.  Eyes: EOM are normal. Pupils are equal, round, and reactive to light.  Neck: Normal range of motion. Neck supple.  Cardiovascular: Normal rate and regular rhythm.  Exam reveals no friction rub.   No murmur heard. Pulmonary/Chest: Effort normal and breath sounds normal. No respiratory distress. She has no wheezes. She has no rales.  Abdominal: Soft. She exhibits no distension. There is no tenderness. There is no rebound.  Musculoskeletal: Normal range of motion. She exhibits no edema.  Neurological: She is alert and oriented to person, place, and time.  Skin: No rash noted. She is not diaphoretic.  Nursing note and vitals reviewed.   ED Course  Procedures (including critical care time) Labs Review Labs Reviewed  CBC - Abnormal; Notable for the following:    Hemoglobin 11.5 (*)    HCT 34.3 (*)    All other components within normal limits  BASIC METABOLIC PANEL - Abnormal; Notable for the following:    Potassium 3.4 (*)    Glucose, Bld 101 (*)    All other components within normal limits  LIPASE, BLOOD  I-STAT TROPOININ, ED  Randolm Idol, ED    Imaging Review Dg Chest 2 View  07/15/2014   CLINICAL DATA:  Central chest pain since earlier this week.  EXAM: CHEST  2 VIEW  COMPARISON:  05/17/2013  FINDINGS: Cardiomediastinal contours are unchanged. There is no consolidation, pleural effusion, or pneumothorax. No pulmonary edema. Severe scoliotic curvature of the thoracolumbar spine, unchanged.  IMPRESSION: No acute pulmonary process.   Electronically Signed   By: Jeb Levering M.D.   On: 07/15/2014 22:30     EKG Interpretation   Date/Time:  Saturday July 15 2014 22:08:29 EDT Ventricular Rate:  75 PR  Interval:  187 QRS Duration: 91 QT Interval:  395 QTC Calculation: 441 R Axis:   16 Text Interpretation:  Sinus rhythm Consider left atrial enlargement  Similar to prior Confirmed by Mingo Amber  MD, [place] ([id number]) on 07/15/2014  10:12:41 PM      MDM   Final diagnoses:  Chest pain  Heartburn    33F here with chest pain. Present for the past 2 weeks. Intermittent for the past 2 months. Feels like indigestion. States urge to belch prick continuously. Constant, waxing way but has never fully left her. No fevers. No shortness of breath. Feels like pressure her chest. No heart history. She is concerned she is chewing nicotine gum to long and this is causing her reflux. Here  vitals stable. Exam benign. Likely indigestion, we'll give GI cocktail and check troponins and labs. With two weeks of constant symptoms, I do not feel she needs serial troponins. Feeling better after GI cocktail. Troponin ok. CXR ok. Patient stable for discharge, can f/u with PCP.  Evelina Bucy, MD 07/15/14 [phone]

## 2014-07-15 NOTE — ED Notes (Signed)
Patient transported to X-ray 

## 2014-07-15 NOTE — ED Notes (Signed)
Awake. Verbally responsive. A/O x4. Resp even and unlabored. No audible adventitious breath sounds noted. ABC's intact.  

## 2014-07-15 NOTE — ED Notes (Signed)
Pt presents with central chest pain that started earlier this week. Pt reports she feels like something is stuck in her chest, believed the pain to be related to indigestion but the pain is not getting any better. Pt ambulatory into ER.

## 2014-07-15 NOTE — ED Notes (Signed)
Awake. Verbally responsive. A/O x4. Resp even and unlabored. No audible adventitious breath sounds noted. ABC's intact. SR on monitor at 80bpm.

## 2014-07-15 NOTE — ED Notes (Signed)
Awake. Verbally responsive. A/O x4. Resp even and unlabored. No audible adventitious breath sounds noted. ABC's intact. SR on monitor at 95bpm. Pt reported chest heaviness and radiation to rt shoulder. Denies n/v and SOB.

## 2014-07-19 ENCOUNTER — Other Ambulatory Visit: Payer: Self-pay | Admitting: Family Medicine

## 2014-07-19 MED ORDER — FLUTICASONE PROPIONATE 50 MCG/ACT NA SUSP: 2.0000 | Freq: Every day | NASAL | Status: DC

## 2014-07-19 MED ORDER — ATORVASTATIN CALCIUM 20 MG PO TABS: 20.0000 mg | ORAL_TABLET | Freq: Every day | ORAL | Status: DC

## 2014-07-19 NOTE — Telephone Encounter (Signed)
meds filled

## 2014-08-14 ENCOUNTER — Telehealth: Payer: Self-pay | Admitting: Family Medicine

## 2014-08-14 DIAGNOSIS — R1310 Dysphagia, unspecified: Secondary | ICD-10-CM

## 2014-08-14 MED ORDER — PANTOPRAZOLE SODIUM 20 MG PO TBEC
20.0000 mg | DELAYED_RELEASE_TABLET | Freq: Two times a day (BID) | ORAL | Status: DC
Start: 2014-08-14 — End: 2014-10-09

## 2014-08-14 NOTE — Telephone Encounter (Signed)
Caller name: Alyne Relation to pt: self  Call back number: [phone] Pharmacy:  Reason for call:   Patient states that she is still having GI problems and would like to be referred to GI. She feels like something is going on with esophogus and would like this further checked.

## 2014-08-14 NOTE — Telephone Encounter (Signed)
Relation to pt: self  Call back number: [place]: [facility] [id number] - [place], [address] AT [place]  Reason for call:  Pt requesting a refill pantoprazole (PROTONIX) 20 MG tablet

## 2014-08-14 NOTE — Telephone Encounter (Signed)
Referral placed.

## 2014-08-14 NOTE — Telephone Encounter (Signed)
Last OV 06-30-14, ok for referral?

## 2014-08-14 NOTE — Telephone Encounter (Signed)
Ok to refer.  Pt has seen Dr Deatra Ina at [facility] previously

## 2014-08-14 NOTE — Telephone Encounter (Signed)
Med filled.  

## 2014-08-17 NOTE — Telephone Encounter (Signed)
Since patient is already established with a GI dr, most GI Drs will not take patient on . Could she not see the PA?

## 2014-08-17 NOTE — Telephone Encounter (Signed)
[place] w/ me if she sees PA- not sure how GI is scheduling or how long the wait is.Marland KitchenMarland Kitchen

## 2014-08-17 NOTE — Telephone Encounter (Signed)
Patient states that [facility] cannot get her in until 10/09/14 and would like to know if she can be seen sooner elsewhere.

## 2014-08-17 NOTE — Telephone Encounter (Signed)
PA Cecille Rubin can see her on 08/30/14 @9 :45

## 2014-08-30 ENCOUNTER — Ambulatory Visit: Payer: BLUE CROSS/BLUE SHIELD | Admitting: Physician Assistant

## 2014-10-04 ENCOUNTER — Telehealth: Payer: Self-pay

## 2014-10-04 NOTE — Telephone Encounter (Signed)
Patient walks in to lobby stating that she wants to see Dr. Birdie Riddle. Advised that she is out of the office until next. Patient is seing "spots' before her eyes. Spoke with patient who states that she is feeling better now. States that she was in the ED recently and was advised that K+ low. States that she ate a Banana before she left work, she questions if it is low again. Advised patient to go to [facility] for evaluation.  Advised patient that if symptoms worsen to go to ED or call 911.Agrees with plan.

## 2014-10-06 ENCOUNTER — Telehealth: Payer: Self-pay | Admitting: Family Medicine

## 2014-10-06 NOTE — Telephone Encounter (Signed)
Called Janet Pierce and LMOVM for Janet Pierce explaining that she would need to have an appointment before we can fill any medications. If Janet Pierce wants to wait for Dr. Birdie Riddle that is her decision. However, if she is feeling badly we recommed she see any of the providers here.

## 2014-10-06 NOTE — Telephone Encounter (Signed)
She needs assessment because the potassium is not on her med list, her recent potassium at 3.4 and symptoms likely coming from something else.

## 2014-10-06 NOTE — Telephone Encounter (Signed)
Please advise, pt was seen 06/01/14 for her CPE. Potassium then was 3.6, 2 months ago at hospital visit potassium was 3.4. No record in chart of potassium Rx.

## 2014-10-06 NOTE — Telephone Encounter (Signed)
Caller name: Prue Lingenfelter Relationship to patient: self Can be reached: [place]: [facility] [id number] - [place], [place] [address] AT [place]  Reason for call: Pt calling to see if Dr. Janelle Floor provider will prescribe potassium. See tel notes from 6/15 from [place]. Pt states that she still isn't feeling well. Pt did not go to Urgent Care or ER. She said that she was prescribed potassium when she was in [facility] in May and that she asked the pharmacy to refill it for her. They said they are waiting on approval from provider. I advised that we have not received a request from pharmacy as Dr. Birdie Riddle was not the prescriber. Pt was advised that it may not be ordered until she has been in for office visit. Declined visit with alternate provider.

## 2014-10-09 ENCOUNTER — Encounter: Payer: Self-pay | Admitting: Gastroenterology

## 2014-10-09 ENCOUNTER — Ambulatory Visit (INDEPENDENT_AMBULATORY_CARE_PROVIDER_SITE_OTHER): Payer: BLUE CROSS/BLUE SHIELD | Admitting: Gastroenterology

## 2014-10-09 VITALS — BP 120/80 | HR 97 | Ht 64.0 in | Wt 138.0 lb

## 2014-10-09 DIAGNOSIS — R1310 Dysphagia, unspecified: Secondary | ICD-10-CM | POA: Diagnosis not present

## 2014-10-09 DIAGNOSIS — K219 Gastro-esophageal reflux disease without esophagitis: Secondary | ICD-10-CM

## 2014-10-09 MED ORDER — DEXLANSOPRAZOLE 60 MG PO CPDR: 60.0000 mg | DELAYED_RELEASE_CAPSULE | Freq: Every day | ORAL | Status: DC

## 2014-10-09 NOTE — Assessment & Plan Note (Signed)
I suspect that chest discomfort is related to ongoing acid reflux area  Recommendations #1 trial of dexilant 60 mg daily

## 2014-10-09 NOTE — Assessment & Plan Note (Signed)
Rule out early esophageal stricture  Recommendations #1 upper endoscopy with dilation as indicated

## 2014-10-09 NOTE — Patient Instructions (Signed)
You have been scheduled for an Endoscopy. Separate instructions have been given. Dexilant has been sent to your pharmacy.

## 2014-10-09 NOTE — Progress Notes (Signed)
_                                                                                                                History of Present Illness:  Janet Pierce is again complaining of chest pain consisting of tightness and discomfort with swallowing.  She has what sounds like mild dysphagia to solids.  She also has intermittent pyrosis.  Symptoms have incompletely improved with Protonix 20 mg twice a day.  She has a constant feeling of pain in her chest made worse with swallowing.   Past Medical History  Diagnosis Date  . Retinal detachment     left eye prosthesis  . Scoliosis   . Hypertension   . Glaucoma   . Hyperlipidemia   . Arthritis    Past Surgical History  Procedure Laterality Date  . Retinal detachment surgery    . Lipoma excision    . Tonsillectomy    . Eye surgery Right     glucoma   family history includes Bone cancer in her mother; Diabetes in her maternal grandmother; Hypertension in her mother; Lung cancer in her mother. There is no history of Colon cancer. Current Outpatient Prescriptions  Medication Sig Dispense Refill  . acetaminophen (TYLENOL) 500 MG tablet Take 500 mg by mouth every 6 (six) hours as needed for mild pain or moderate pain.    Marland Kitchen amLODipine (NORVASC) 10 MG tablet TAKE 1 TABLET BY MOUTH EVERY DAY BEFORE BREAKFAST 30 tablet 6  . bimatoprost (LUMIGAN) 0.01 % SOLN Place 1 drop into both eyes at bedtime.    . cholecalciferol (VITAMIN D) 1000 UNITS tablet Take 1,000 Units by mouth daily.    Marland Kitchen co-enzyme Q-10 30 MG capsule Take 30 mg by mouth daily.    . fluticasone (FLONASE) 50 MCG/ACT nasal spray Place 2 sprays into both nostrils daily. 16 g 6  . Multiple Vitamin (MULTIVITAMIN WITH MINERALS) TABS Take 2 tablets by mouth daily.     . pantoprazole (PROTONIX) 20 MG tablet Take 1 tablet (20 mg total) by mouth 2 (two) times daily. 60 tablet 6  . SIMBRINZA 1-0.2 % SUSP Place 1 drop into the right eye 2 (two) times daily.     . timolol (TIMOPTIC)  0.5 % ophthalmic solution Place 1 drop into the right eye 2 (two) times daily.      No current facility-administered medications for this visit.   Allergies as of 10/09/2014  . (No Known Allergies)    reports that she has been smoking Cigarettes.  She has been smoking about 0.25 packs per day. She has never used smokeless tobacco. She reports that she drinks alcohol. She reports that she does not use illicit drugs.   Review of Systems: Pertinent positive and negative review of systems were noted in the above HPI section. All other review of systems were otherwise negative.  Vital signs were reviewed in today's medical record Physical Exam: General: Well developed , well nourished, no acute distress Skin: anicteric Head: Normocephalic and atraumatic Eyes:  sclerae anicteric, EOMI Ears: Normal auditory acuity Mouth: No deformity or lesions Neck: Supple, no masses or thyromegaly Lymph Nodes: no lymphadenopathy Lungs: Clear throughout to auscultation Heart: Regular rate and rhythm; no murmurs, rubs or bruits Gastroinestinal: Soft, non tender and non distended. No masses, hepatosplenomegaly or hernias noted. Normal Bowel sounds Rectal:deferred Musculoskeletal: Symmetrical with no gross deformities  Skin: No lesions on visible extremities Pulses:  Normal pulses noted Extremities: No clubbing, cyanosis, edema or deformities noted Neurological: Alert oriented x 4, grossly nonfocal Cervical Nodes:  No significant cervical adenopathy Inguinal Nodes: No significant inguinal adenopathy Psychological:  Alert and cooperative. Normal mood and affect  See Assessment and Plan under Problem List

## 2014-10-27 ENCOUNTER — Ambulatory Visit: Payer: BLUE CROSS/BLUE SHIELD | Admitting: Family Medicine

## 2014-10-30 ENCOUNTER — Ambulatory Visit (INDEPENDENT_AMBULATORY_CARE_PROVIDER_SITE_OTHER): Payer: BLUE CROSS/BLUE SHIELD | Admitting: Medical

## 2014-10-30 ENCOUNTER — Ambulatory Visit (HOSPITAL_BASED_OUTPATIENT_CLINIC_OR_DEPARTMENT_OTHER)
Admission: RE | Admit: 2014-10-30 | Discharge: 2014-10-30 | Disposition: A | Discharge status: Home / Self Care | Payer: BLUE CROSS/BLUE SHIELD | Source: Ambulatory Visit | Attending: Medical | Admitting: Medical

## 2014-10-30 ENCOUNTER — Encounter: Payer: Self-pay | Admitting: Medical

## 2014-10-30 VITALS — BP 107/89 | HR 98 | Temp 98.4°F | Ht 64.0 in | Wt 138.6 lb

## 2014-10-30 DIAGNOSIS — R6884 Jaw pain: Secondary | ICD-10-CM

## 2014-10-30 DIAGNOSIS — R5383 Other fatigue: Secondary | ICD-10-CM

## 2014-10-30 DIAGNOSIS — M25511 Pain in right shoulder: Secondary | ICD-10-CM

## 2014-10-30 DIAGNOSIS — Z658 Other specified problems related to psychosocial circumstances: Secondary | ICD-10-CM

## 2014-10-30 DIAGNOSIS — F489 Nonpsychotic mental disorder, unspecified: Secondary | ICD-10-CM

## 2014-10-30 DIAGNOSIS — H539 Unspecified visual disturbance: Secondary | ICD-10-CM | POA: Diagnosis not present

## 2014-10-30 LAB — CBC WITH DIFFERENTIAL/PLATELET
BASOS ABS: 0.1 10*3/uL (ref 0.0–0.1)
Basophils Relative: 1.3 % (ref 0.0–3.0)
EOS ABS: 0 10*3/uL (ref 0.0–0.7)
Eosinophils Relative: 0.8 % (ref 0.0–5.0)
HCT: 37.7 % (ref 36.0–46.0)
HEMOGLOBIN: 12.5 g/dL (ref 12.0–15.0)
LYMPHS ABS: 1.3 10*3/uL (ref 0.7–4.0)
Lymphocytes Relative: 29.4 % (ref 12.0–46.0)
MCHC: 33.1 g/dL (ref 30.0–36.0)
MCV: 84.6 fl (ref 78.0–100.0)
Monocytes Absolute: 0.3 10*3/uL (ref 0.1–1.0)
Monocytes Relative: 6.4 % (ref 3.0–12.0)
NEUTROS PCT: 62.1 % (ref 43.0–77.0)
Neutro Abs: 2.9 10*3/uL (ref 1.4–7.7)
Platelets: 320 10*3/uL (ref 150.0–400.0)
RBC: 4.46 Mil/uL (ref 3.87–5.11)
RDW: 13.2 % (ref 11.5–15.5)
WBC: 4.6 10*3/uL (ref 4.0–10.5)

## 2014-10-30 LAB — COMPREHENSIVE METABOLIC PANEL
ALT: 28 U/L (ref 0–35)
AST: 22 U/L (ref 0–37)
Albumin: 4.4 g/dL (ref 3.5–5.2)
Alkaline Phosphatase: 57 U/L (ref 39–117)
BILIRUBIN TOTAL: 0.5 mg/dL (ref 0.2–1.2)
BUN: 9 mg/dL (ref 6–23)
CO2: 29 mEq/L (ref 19–32)
CREATININE: 0.81 mg/dL (ref 0.40–1.20)
Calcium: 10 mg/dL (ref 8.4–10.5)
Chloride: 101 mEq/L (ref 96–112)
GFR: 91.66 mL/min (ref >60.00)
Glucose, Bld: 131 mg/dL — ABNORMAL HIGH (ref 70–99)
Potassium: 3.4 mEq/L — ABNORMAL LOW (ref 3.5–5.1)
SODIUM: 141 meq/L (ref 135–145)
TOTAL PROTEIN: 7.9 g/dL (ref 6.0–8.3)

## 2014-10-30 LAB — SEDIMENTATION RATE: Sed Rate: 22 mm/hr (ref 0–22)

## 2014-10-30 MED ORDER — CYCLOBENZAPRINE HCL 10 MG PO TABS
10.0000 mg | ORAL_TABLET | Freq: Every day | ORAL | Status: DC
Start: 2014-10-30 — End: 2015-05-30

## 2014-10-30 NOTE — Progress Notes (Signed)
Pre visit review using our clinic review tool, if applicable. No additional management support is needed unless otherwise documented below in the visit note. 

## 2014-10-30 NOTE — Patient Instructions (Signed)
Fatigue- will get cmp, and cbc. Check electrolytes. Particularly k since hx of those being low in May.  Jaw pain- sore post dental procedure. But better today after seeing dentist. Would recommend staing on the antibiotic.  Rt shoulder pain- on and off pain. Worse since bowling on Friday. Will get shoulder xray.  Low dose alleve twice daily if needed.  Vision disturbance- Stress importance of seeing optometrist today. I am getting sed rate today since some HA over weekend in temporal areas. If after optometrist you were to still get acute vision changes then ED evaluation. Would recommend main hospital ED in that event. Note normal neurologic exam today.  For you tension with trapezius tenderness, I am going to prescribe muscle relaxant to use over next 5 nights.   Follow up in 7 days or as  needed

## 2014-10-30 NOTE — Progress Notes (Signed)
Subjective:    Patient ID: Janet Pierce, female    DOB: Sep 09, 1950, 63 y.o.   MRN: [id number]  HPI  Pt in stating feeling tired and restless. Feeling tense.  Pt had 2 teeth extracted on last Tuesday. Pt states partial plate was bothering her. Pt did see this am. They did adjustment of her plate and she does not have any pain in jaw know.  Also some bilateral shoulder pain has been occuring. Also neck muscles feel tight. She did not sleep well last night.  On and off shoulder pain both sides. Last night some shoulder pain last night. Pt states rt shoulder pain started to hurt Friday night after she bowled.   Pt had mild constant ha since dental procedure. But some this weekend pain decreased. Stopped this am am after seeing dentist  Pt has some mild rt eye vision changes(no new floaters,no light flashes). 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 Pt is seeing her optometrist this afternoon right after she leaves. No rt eye pain. Pt thinks something wrong with her contact.   Fatigue this past week over past week. She states every since oral surgery she has not slept well. She does report 2 month ago low k.  No muscle cramps and no palpitations.     Review of Systems  Constitutional: Positive for fatigue.  Eyes: Positive for visual disturbance. Negative for photophobia, discharge and itching.       Rt eye. Seeing optometrist when leaves here.  Cardiovascular: Negative for chest pain and palpitations.  Gastrointestinal: Negative.   Genitourinary: Negative.   Musculoskeletal: Negative for myalgias, back pain, joint swelling and neck stiffness.       Trapezius pain, rt shouder and upper jaw pain.  Neurological: Negative for dizziness, tremors, seizures, syncope, facial asymmetry, speech difficulty, weakness, light-headedness, numbness and headaches.       Ha decreased significatntly this weekend. After saw dentist and adjusment made feels very good in mouth and  Ha  resolved now. Pt thought ha from tense muscles around mouth.  Psychiatric/Behavioral: Negative for suicidal ideas, hallucinations, behavioral problems, confusion and sleep disturbance. The patient is not nervous/anxious and is not hyperactive.        Tense/restless    Past Medical History  Diagnosis Date  . Retinal detachment     left eye prosthesis  . Scoliosis   . Hypertension   . Glaucoma   . Hyperlipidemia   . Arthritis     History   Social History  . Marital Status: Legally Separated    Spouse Name: N/A  . Number of Children: 2  . Years of Education: N/A   Occupational History  . Water quality scientist one   Social History Main Topics  . Smoking status: Current Some Day Smoker -- 0.25 packs/day    Types: Cigarettes  . Smokeless tobacco: Never Used     Comment: using nicorette gum  . Alcohol Use: 0.0 oz/week    0 Standard drinks or equivalent per week     Comment: occasionally  . Drug Use: No  . Sexual Activity: Not on file   Other Topics Concern  . Not on file   Social History Narrative    Past Surgical History  Procedure Laterality Date  . Retinal detachment surgery    . Lipoma excision    . Tonsillectomy    . Eye surgery Right     glucoma    Family History  Problem Relation Age of Onset  . Lung cancer Mother   . Hypertension Mother   . Bone cancer Mother   . Diabetes Maternal Grandmother   . Colon cancer Neg Hx     No Known Allergies  Current Outpatient Prescriptions on File Prior to Visit  Medication Sig Dispense Refill  . acetaminophen (TYLENOL) 500 MG tablet Take 500 mg by mouth every 6 (six) hours as needed for mild pain or moderate pain.    Marland Kitchen amLODipine (NORVASC) 10 MG tablet TAKE 1 TABLET BY MOUTH EVERY DAY BEFORE BREAKFAST 30 tablet 6  . bimatoprost (LUMIGAN) 0.01 % SOLN Place 1 drop into both eyes at bedtime.    . cholecalciferol (VITAMIN D) 1000 UNITS tablet Take 1,000 Units by mouth daily.    Marland Kitchen co-enzyme Q-10 30 MG capsule Take  30 mg by mouth daily.    Marland Kitchen dexlansoprazole (DEXILANT) 60 MG capsule Take 1 capsule (60 mg total) by mouth daily. 90 capsule 3  . fluticasone (FLONASE) 50 MCG/ACT nasal spray Place 2 sprays into both nostrils daily. 16 g 6  . Multiple Vitamin (MULTIVITAMIN WITH MINERALS) TABS Take 2 tablets by mouth daily.     Marland Kitchen SIMBRINZA 1-0.2 % SUSP Place 1 drop into the right eye 2 (two) times daily.     . timolol (TIMOPTIC) 0.5 % ophthalmic solution Place 1 drop into the right eye 2 (two) times daily.      No current facility-administered medications on file prior to visit.    BP 107/89 mmHg  Pulse 98  Temp(Src) 98.4 F (36.9 C) (Oral)  Ht 5\' 4"  (1.626 m)  Wt 138 lb 9.6 oz (62.869 kg)  BMI 23.78 kg/m2  SpO2 100%       Objective:   Physical Exam  General Mental Status- Alert. General Appearance- Not in acute distress.   Skin General: Color- Normal Color. Moisture- Normal Moisture.  Neck Carotid Arteries- Normal color. Moisture- Normal Moisture. No carotid bruits. No JVD. Trpezius tender to palpation. NO mid cspine tenderness.  Chest and Lung Exam Auscultation: Breath Sounds:-Normal. CTA.  Cardiovascular Auscultation:Rythm- Regular, Rate and Rythm Murmurs & Other Heart Sounds:Auscultation of the heart reveals- No Murmurs.  Abdomen Inspection:-Inspeection Normal. Palpation/Percussion:Note:No mass. Palpation and Percussion of the abdomen reveal- Non Tender, Non Distended + BS, no rebound or guarding.    Neurologic Cranial Nerve exam:- CN III-XII intact(No nystagmus), symmetric smile. Drift Test:- No drift. Finger to Nose:- Normal/Intact Strength:- 5/5 equal and symmetric strength both upper and lower extremities.  Rt shoulder- full rom no pain presently.( Pt states she can feel some mild crepitus)  Skin- no abnormality of rt temporal area.  Eyes- rt side. Pupil reactive to light. Conjunctiva normal. Lt eye- prosthetic.      Assessment & Plan:  Fatigue- will get cmp, and  cbc. Check electrolytes. Particularly k since hx of those being low in May.  Jaw pain- sore post dental procedure. But better today after seeing dentist. Would recommend staing on the antibiotic.  Rt shoulder pain- on and off pain. Worse since bowling on Friday. Will get shoulder xray.  Low dose alleve twice daily if needed.  Vision disturbance- Stress importance of seeing optometrist today. I am getting sed rate today since some HA over weekend in temporal areas. If after optometrist you were to still get acute vision changes then ED evaluation. Would recommend main hospital ED in that event. Note normal neurologic exam today.  For you tension with trapezius tenderness, I am going to prescribe  muscle relaxant to use over next 5 nights.   Follow up in 7 days or as  needed

## 2014-11-01 ENCOUNTER — Telehealth: Payer: Self-pay | Admitting: Family Medicine

## 2014-11-01 DIAGNOSIS — E876 Hypokalemia: Secondary | ICD-10-CM

## 2014-11-01 MED ORDER — POTASSIUM CHLORIDE CRYS ER 10 MEQ PO TBCR: 10.0000 meq | EXTENDED_RELEASE_TABLET | Freq: Once | ORAL | Status: DC

## 2014-11-01 NOTE — Telephone Encounter (Signed)
Caller name: Konstantina Relationship to patient: SELF  Can be [phone] Pharmacy:  Reason for call: SHE ASKED ABOUT HER LAB RESULT AND HER POTASSIUM WAS LOW AND SHE WAS UNCLEAR IF THERE WOULD BE A PRESCRIPTION OR IF SHE WAS SUPPOSED TO TAKE SOMETHING OVER THE COUNTER.  SHE NOW HAS SLIGHTLY BLURRY VISION  PLEASE CALL THE PATIENT

## 2014-11-01 NOTE — Telephone Encounter (Signed)
Please advise pt she seen Janet Pierce on 10/30/14

## 2014-11-09 ENCOUNTER — Ambulatory Visit (INDEPENDENT_AMBULATORY_CARE_PROVIDER_SITE_OTHER): Payer: BLUE CROSS/BLUE SHIELD | Admitting: Medical

## 2014-11-09 ENCOUNTER — Encounter: Payer: Self-pay | Admitting: Medical

## 2014-11-09 VITALS — BP 123/74 | HR 88 | Temp 98.4°F | Ht 64.0 in | Wt 140.2 lb

## 2014-11-09 DIAGNOSIS — M25511 Pain in right shoulder: Secondary | ICD-10-CM | POA: Diagnosis not present

## 2014-11-09 DIAGNOSIS — E876 Hypokalemia: Secondary | ICD-10-CM | POA: Diagnosis not present

## 2014-11-09 DIAGNOSIS — H409 Unspecified glaucoma: Secondary | ICD-10-CM

## 2014-11-09 DIAGNOSIS — H04121 Dry eye syndrome of right lacrimal gland: Secondary | ICD-10-CM | POA: Diagnosis not present

## 2014-11-09 LAB — COMPREHENSIVE METABOLIC PANEL
ALK PHOS: 64 U/L (ref 39–117)
ALT: 16 U/L (ref 0–35)
AST: 16 U/L (ref 0–37)
Albumin: 4.3 g/dL (ref 3.5–5.2)
BILIRUBIN TOTAL: 0.6 mg/dL (ref 0.2–1.2)
BUN: 11 mg/dL (ref 6–23)
CHLORIDE: 101 meq/L (ref 96–112)
CO2: 32 mEq/L (ref 19–32)
Calcium: 9.5 mg/dL (ref 8.4–10.5)
Creatinine, Ser: 0.78 mg/dL (ref 0.40–1.20)
GFR: 95.74 mL/min (ref >60.00)
Glucose, Bld: 76 mg/dL (ref 70–99)
POTASSIUM: 3.5 meq/L (ref 3.5–5.1)
Sodium: 139 mEq/L (ref 135–145)
Total Protein: 7.2 g/dL (ref 6.0–8.3)

## 2014-11-09 NOTE — Progress Notes (Signed)
Pre visit review using our clinic review tool, if applicable. No additional management support is needed unless otherwise documented below in the visit note. 

## 2014-11-09 NOTE — Patient Instructions (Addendum)
  With your shoulder pain history if you get recurrent flares then can refer you to ortho. Just let us know.  Regarding you glaucoma hx, If any acute problems with your rt eye let us now and could try to expidite referal to optometry or your opthalmologist.  For you low k, will repeat cmp today. Based on level will make decision if need supplement or if can use banana/natural supplementation.  Follow up as regularly scheduled pcp or as needed

## 2014-11-09 NOTE — Progress Notes (Signed)
Subjective:    Patient ID: Janet Pierce, female    DOB: July 13, 1950, 64 y.o.   MRN: [id number]  HPI   Pt in statin feeling good. Pt states went back to dentist and her plate was adjustment. No jaw pain now.  Pt rt shoulder has been ok/improved. She used ibuprofen briefly. And no problems with rom.  Pt went to her optometrist and her vision is ok. They gave new prescription.  Also went to opthalmologist. He thought dry eyes may have caused symptoms. Pt has dry eye drops and glaucoma drops.  Pt has low k on labs. I rx'd k-dur and she took for 7 days. Now in for check. No cramping.    Review of Systems  Constitutional: Negative for chills and fatigue.  HENT: Negative for congestion.   Eyes: Negative for visual disturbance.  Respiratory: Negative for cough, chest tightness, shortness of breath and wheezing.   Cardiovascular: Negative for chest pain and palpitations.  Musculoskeletal: Negative for myalgias and back pain.  Neurological: Negative for dizziness and headaches.    Past Medical History  Diagnosis Date  . Retinal detachment     left eye prosthesis  . Scoliosis   . Hypertension   . Glaucoma   . Hyperlipidemia   . Arthritis     History   Social History  . Marital Status: Legally Separated    Spouse Name: N/A  . Number of Children: 2  . Years of Education: N/A   Occupational History  . Water quality scientist one   Social History Main Topics  . Smoking status: Current Some Day Smoker -- 0.25 packs/day    Types: Cigarettes  . Smokeless tobacco: Never Used     Comment: using nicorette gum  . Alcohol Use: 0.0 oz/week    0 Standard drinks or equivalent per week     Comment: occasionally  . Drug Use: No  . Sexual Activity: Not on file   Other Topics Concern  . Not on file   Social History Narrative    Past Surgical History  Procedure Laterality Date  . Retinal detachment surgery    . Lipoma excision    . Tonsillectomy    . Eye surgery Right       glucoma    Family History  Problem Relation Age of Onset  . Lung cancer Mother   . Hypertension Mother   . Bone cancer Mother   . Diabetes Maternal Grandmother   . Colon cancer Neg Hx     No Known Allergies  Current Outpatient Prescriptions on File Prior to Visit  Medication Sig Dispense Refill  . acetaminophen (TYLENOL) 500 MG tablet Take 500 mg by mouth every 6 (six) hours as needed for mild pain or moderate pain.    Marland Kitchen amLODipine (NORVASC) 10 MG tablet TAKE 1 TABLET BY MOUTH EVERY DAY BEFORE BREAKFAST 30 tablet 6  . bimatoprost (LUMIGAN) 0.01 % SOLN Place 1 drop into both eyes at bedtime.    . cholecalciferol (VITAMIN D) 1000 UNITS tablet Take 1,000 Units by mouth daily.    Marland Kitchen co-enzyme Q-10 30 MG capsule Take 30 mg by mouth daily.    . cyclobenzaprine (FLEXERIL) 10 MG tablet Take 1 tablet (10 mg total) by mouth at bedtime. 5 tablet 0  . dexlansoprazole (DEXILANT) 60 MG capsule Take 1 capsule (60 mg total) by mouth daily. 90 capsule 3  . fluticasone (FLONASE) 50 MCG/ACT nasal spray Place 2 sprays into both nostrils daily. [place]  g 6  . ibuprofen (ADVIL,MOTRIN) 600 MG tablet Take 600 mg by mouth every 6 (six) hours as needed.    . Multiple Vitamin (MULTIVITAMIN WITH MINERALS) TABS Take 2 tablets by mouth daily.     . penicillin v potassium (VEETID) 500 MG tablet Take 500 mg by mouth every 6 (six) hours. Tooth extraction    . potassium chloride (K-DUR,KLOR-CON) 10 MEQ tablet Take 1 tablet (10 mEq total) by mouth once. 7 tablet 0  . SIMBRINZA 1-0.2 % SUSP Place 1 drop into the right eye 2 (two) times daily.     . timolol (TIMOPTIC) 0.5 % ophthalmic solution Place 1 drop into the right eye 2 (two) times daily.     . Potassium 99 MG TABS Take by mouth.     No current facility-administered medications on file prior to visit.    BP 123/74 mmHg  Pulse 88  Temp(Src) 98.4 F (36.9 C) (Oral)  Ht 5\' 4"  (1.626 m)  Wt 140 lb 3.2 oz (63.594 kg)  BMI 24.05 kg/m2  SpO2 98%        Objective:   Physical Exam  General Mental Status- Alert. General Appearance- Not in acute distress.   Skin General: Color- Normal Color. Moisture- Normal Moisture.  Neck Carotid Arteries- Normal color. Moisture- Normal Moisture. No carotid bruits. No JVD. Marland Kitchen  Chest and Lung Exam Auscultation: Breath Sounds:-Normal. CTA.  Cardiovascular Auscultation:Rythm- Regular, Rate and Rythm Murmurs & Other Heart Sounds:Auscultation of the heart reveals- No Murmurs.    Neurologic Cranial Nerve exam:- CN III-XII intact(No nystagmus), symmetric smile.  Strength:- 5/5 equal and symmetric strength both upper and lower extremities.  Rt shoulder- full rom no pain presently.  Eyes- rt side. Pupil reactive to light. Conjunctiva normal. Lt eye- prosthetic. .       Assessment & Plan:  With your shoulder pain history if you get recurrent flares then can refer you to ortho. Just let us know.  Regarding you glaucoma hx, If any acute problems with your rt eye let us now and could try to expidite referal to optometry or your opthalmologist.  For you low k, will repeat cmp today. Based on level will make decision if need supplement or if can use banana/natural supplementation.  Follow up as regularly scheduled pcp or as

## 2014-11-10 MED ORDER — POTASSIUM CHLORIDE CRYS ER 10 MEQ PO TBCR: 10.0000 meq | EXTENDED_RELEASE_TABLET | Freq: Once | ORAL | Status: DC

## 2014-11-10 NOTE — Telephone Encounter (Signed)
Patient in to see ES on yesterday. Called patient today and left message on answering machine regarding repeat labs. Asked to call with questions.

## 2014-12-01 ENCOUNTER — Ambulatory Visit: Payer: BLUE CROSS/BLUE SHIELD | Admitting: Family Medicine

## 2014-12-01 ENCOUNTER — Telehealth: Payer: Self-pay | Admitting: Family Medicine

## 2014-12-08 ENCOUNTER — Telehealth: Payer: Self-pay | Admitting: Gastroenterology

## 2014-12-08 NOTE — Telephone Encounter (Signed)
Pt was no show for appt 12/01/14 8:45pm, follow up appt, pt came in late at 9:16 am, pt will reschedule but has not yet, charge no show fee?

## 2014-12-08 NOTE — Telephone Encounter (Signed)
Yes- please charge as this is not the first time

## 2014-12-08 NOTE — Telephone Encounter (Signed)
Yes

## 2014-12-12 ENCOUNTER — Encounter: Payer: BLUE CROSS/BLUE SHIELD | Admitting: Gastroenterology

## 2015-01-03 ENCOUNTER — Other Ambulatory Visit: Payer: Self-pay | Admitting: Medical

## 2015-01-03 NOTE — Telephone Encounter (Signed)
Please advise pt that I did send in her k-dur. But I also want her to come by and check her k level. Herk level was within normal level last check but just within normal level. Would like to recheck lab. Important to know where she is with her level. I put in future lab. Can you have her get that/schedule with lab. She does not have to see me that day.

## 2015-01-03 NOTE — Telephone Encounter (Signed)
Rx sent to pharmacy. Pt was advised to call back to schedule a lab appt to re-check her potassium.

## 2015-01-04 ENCOUNTER — Other Ambulatory Visit (INDEPENDENT_AMBULATORY_CARE_PROVIDER_SITE_OTHER): Payer: BLUE CROSS/BLUE SHIELD

## 2015-01-04 DIAGNOSIS — E876 Hypokalemia: Secondary | ICD-10-CM | POA: Diagnosis not present

## 2015-01-04 LAB — COMPREHENSIVE METABOLIC PANEL
ALBUMIN: 4.2 g/dL (ref 3.5–5.2)
ALT: 18 U/L (ref 0–35)
AST: 21 U/L (ref 0–37)
Alkaline Phosphatase: 57 U/L (ref 39–117)
BILIRUBIN TOTAL: 0.6 mg/dL (ref 0.2–1.2)
BUN: 9 mg/dL (ref 6–23)
CALCIUM: 9.6 mg/dL (ref 8.4–10.5)
CHLORIDE: 101 meq/L (ref 96–112)
CO2: 35 mEq/L — ABNORMAL HIGH (ref 19–32)
CREATININE: 0.67 mg/dL (ref 0.40–1.20)
GFR: 114.04 mL/min (ref >60.00)
Glucose, Bld: 84 mg/dL (ref 70–99)
Potassium: 4.2 mEq/L (ref 3.5–5.1)
Sodium: 140 mEq/L (ref 135–145)
Total Protein: 7.3 g/dL (ref 6.0–8.3)

## 2015-01-05 ENCOUNTER — Telehealth: Payer: Self-pay

## 2015-01-05 DIAGNOSIS — E876 Hypokalemia: Secondary | ICD-10-CM

## 2015-01-05 NOTE — Telephone Encounter (Signed)
Pt needs to come in for a nurse visit and then have some labs completed. I will put the labs in for a future date.

## 2015-01-05 NOTE — Telephone Encounter (Signed)
Thanks. If she has any questions she can call back.

## 2015-01-05 NOTE — Telephone Encounter (Signed)
Scheduled lab and nurse visit for 02/15/15

## 2015-01-05 NOTE — Telephone Encounter (Signed)
Pt state she has only been on the Potassium Rx for one month. FYI

## 2015-01-05 NOTE — Telephone Encounter (Signed)
Then I would hold the k-dur tabs presently/not use. Follow up in 6 wks for bp check. Repeat k level when in for that appointment. Just eat banana 2-3 times a week. If she maintains k level about the same and does not drop to borderline low or low leves  then she may not need tabs.

## 2015-01-05 NOTE — Telephone Encounter (Signed)
Patient has upcoming appt on 02/15/15 for blood work and BP check.

## 2015-01-05 NOTE — Telephone Encounter (Signed)
Left message for pt to call back  °

## 2015-01-31 ENCOUNTER — Other Ambulatory Visit: Payer: Self-pay | Admitting: Medical

## 2015-02-08 ENCOUNTER — Ambulatory Visit (INDEPENDENT_AMBULATORY_CARE_PROVIDER_SITE_OTHER): Payer: BLUE CROSS/BLUE SHIELD | Admitting: Family Medicine

## 2015-02-08 ENCOUNTER — Encounter: Payer: Self-pay | Admitting: Family Medicine

## 2015-02-08 VITALS — BP 124/84 | HR 96 | Temp 98.7°F | Resp 16 | Wt 137.0 lb

## 2015-02-08 DIAGNOSIS — N390 Urinary tract infection, site not specified: Secondary | ICD-10-CM | POA: Diagnosis not present

## 2015-02-08 DIAGNOSIS — R82998 Other abnormal findings in urine: Secondary | ICD-10-CM

## 2015-02-08 DIAGNOSIS — R140 Abdominal distension (gaseous): Secondary | ICD-10-CM | POA: Diagnosis not present

## 2015-02-08 DIAGNOSIS — R350 Frequency of micturition: Secondary | ICD-10-CM

## 2015-02-08 DIAGNOSIS — R351 Nocturia: Secondary | ICD-10-CM | POA: Diagnosis not present

## 2015-02-08 DIAGNOSIS — R319 Hematuria, unspecified: Secondary | ICD-10-CM | POA: Diagnosis not present

## 2015-02-08 DIAGNOSIS — E876 Hypokalemia: Secondary | ICD-10-CM | POA: Diagnosis not present

## 2015-02-08 LAB — CBC WITH DIFFERENTIAL/PLATELET
BASOS PCT: 0.7 % (ref 0.0–3.0)
Basophils Absolute: 0 10*3/uL (ref 0.0–0.1)
Eosinophils Absolute: 0.1 10*3/uL (ref 0.0–0.7)
Eosinophils Relative: 2.4 % (ref 0.0–5.0)
HEMATOCRIT: 36.1 % (ref 36.0–46.0)
Hemoglobin: 11.8 g/dL — ABNORMAL LOW (ref 12.0–15.0)
LYMPHS PCT: 42.6 % (ref 12.0–46.0)
Lymphs Abs: 1.7 10*3/uL (ref 0.7–4.0)
MCHC: 32.7 g/dL (ref 30.0–36.0)
MCV: 84.6 fl (ref 78.0–100.0)
MONOS PCT: 8.4 % (ref 3.0–12.0)
Monocytes Absolute: 0.3 10*3/uL (ref 0.1–1.0)
Neutro Abs: 1.8 10*3/uL (ref 1.4–7.7)
Neutrophils Relative %: 45.9 % (ref 43.0–77.0)
Platelets: 315 10*3/uL (ref 150.0–400.0)
RBC: 4.27 Mil/uL (ref 3.87–5.11)
RDW: 13.6 % (ref 11.5–15.5)
WBC: 4 10*3/uL (ref 4.0–10.5)

## 2015-02-08 LAB — HEPATIC FUNCTION PANEL
ALT: 25 U/L (ref 0–35)
AST: 21 U/L (ref 0–37)
Albumin: 4.2 g/dL (ref 3.5–5.2)
Alkaline Phosphatase: 55 U/L (ref 39–117)
BILIRUBIN TOTAL: 0.5 mg/dL (ref 0.2–1.2)
Bilirubin, Direct: 0.1 mg/dL (ref 0.0–0.3)
TOTAL PROTEIN: 7.4 g/dL (ref 6.0–8.3)

## 2015-02-08 LAB — POCT URINALYSIS DIPSTICK
BILIRUBIN UA: NEGATIVE
Glucose, UA: NEGATIVE
Ketones, UA: NEGATIVE
NITRITE UA: NEGATIVE
PH UA: 6.5
PROTEIN UA: NEGATIVE
Spec Grav, UA: 1.015
UROBILINOGEN UA: 0.2

## 2015-02-08 LAB — BASIC METABOLIC PANEL
BUN: 9 mg/dL (ref 6–23)
CHLORIDE: 102 meq/L (ref 96–112)
CO2: 33 mEq/L — ABNORMAL HIGH (ref 19–32)
Calcium: 9.5 mg/dL (ref 8.4–10.5)
Creatinine, Ser: 0.72 mg/dL (ref 0.40–1.20)
GFR: 104.92 mL/min (ref >60.00)
Glucose, Bld: 93 mg/dL (ref 70–99)
POTASSIUM: 3.8 meq/L (ref 3.5–5.1)
SODIUM: 140 meq/L (ref 135–145)

## 2015-02-08 LAB — TSH: TSH: 1.69 u[IU]/mL (ref 0.35–4.50)

## 2015-02-08 MED ORDER — CEPHALEXIN 500 MG PO CAPS: 500.0000 mg | ORAL_CAPSULE | Freq: Two times a day (BID) | ORAL | Status: AC

## 2015-02-08 NOTE — Patient Instructions (Signed)
Follow up as needed We'll notify you of your lab results and make any changes if needed Your urine is suspicious for an infection- increase your water intake and start the keflex twice daily Call with any questions or concerns Hang in there!!!

## 2015-02-08 NOTE — Assessment & Plan Note (Signed)
Pt's sxs and UA consistent w/ UTI- particularly after taking bubble bath this weekend.  Start abx.  Send urine for cx.  Will follow.

## 2015-02-08 NOTE — Assessment & Plan Note (Signed)
Chronic problem.  Pt is not taking meds regularly and again notes leg and hand cramps.  Check K+ and replete prn.

## 2015-02-08 NOTE — Progress Notes (Signed)
Pre visit review using our clinic review tool, if applicable. No additional management support is needed unless otherwise documented below in the visit note. 

## 2015-02-08 NOTE — Progress Notes (Signed)
   Subjective:    Patient ID: Janet Pierce, female    DOB: August 07, 1950, 64 y.o.   MRN: [id number]  HPI Increased urinary frequency- pt is waking 4-5x/night to use the restroom.  Having R sided back pain.  Denies dysuria.  Took bubble bath on Sunday.  Developed vaginal itching on Monday and used OTC monistat w/ improvement.   Pt reports 'i'm not resting at all'.  + urgency.  + abd bloating.  Hypokalemia- pt admits to not taking K+ 'as I should' and is again having leg and hand cramps.     Review of Systems For ROS see HPI     Objective:   Physical Exam  Constitutional: She is oriented to person, place, and time. She appears well-developed and well-nourished. No distress.  HENT:  Head: Normocephalic and atraumatic.  Cardiovascular: Normal rate, regular rhythm and normal heart sounds.   Pulmonary/Chest: Effort normal and breath sounds normal. No respiratory distress. She has no wheezes. She has no rales.  Abdominal: Soft. Bowel sounds are normal. She exhibits distension (mild). There is no tenderness (no suprapubic or CVA tenderness). There is no rebound and no guarding.  Musculoskeletal: She exhibits no edema.  Neurological: She is alert and oriented to person, place, and time.  Skin: Skin is warm and dry.  Psychiatric: She has a normal mood and affect. Her behavior is normal.  Vitals reviewed.         Assessment & Plan:

## 2015-02-08 NOTE — Assessment & Plan Note (Signed)
Recurrent issue for pt.  This could be related to pt's current UTI- will treat w/ abx.  Check labs to assess for underlying metabolic cause.

## 2015-02-08 NOTE — Addendum Note (Signed)
Addended by: Caffie Pinto on: 02/08/2015 02:36 PM   Modules accepted: Orders

## 2015-02-09 LAB — URINE CULTURE
Colony Count: NO GROWTH
Organism ID, Bacteria: NO GROWTH

## 2015-02-15 ENCOUNTER — Other Ambulatory Visit: Payer: BLUE CROSS/BLUE SHIELD

## 2015-02-15 ENCOUNTER — Ambulatory Visit (INDEPENDENT_AMBULATORY_CARE_PROVIDER_SITE_OTHER): Payer: BLUE CROSS/BLUE SHIELD | Admitting: Family Medicine

## 2015-02-15 ENCOUNTER — Encounter: Payer: Self-pay | Admitting: Family Medicine

## 2015-02-15 VITALS — BP 120/80 | HR 86 | Temp 98.4°F | Resp 16 | Ht 64.0 in | Wt 138.8 lb

## 2015-02-15 DIAGNOSIS — K219 Gastro-esophageal reflux disease without esophagitis: Secondary | ICD-10-CM

## 2015-02-15 DIAGNOSIS — R1033 Periumbilical pain: Secondary | ICD-10-CM | POA: Diagnosis not present

## 2015-02-15 LAB — H. PYLORI ANTIBODY, IGG: H Pylori IgG: NEGATIVE

## 2015-02-15 MED ORDER — PANTOPRAZOLE SODIUM 40 MG PO TBEC: 40.0000 mg | DELAYED_RELEASE_TABLET | Freq: Every day | ORAL | Status: DC

## 2015-02-15 MED ORDER — SUCRALFATE 1 G PO TABS: 1.0000 g | ORAL_TABLET | Freq: Three times a day (TID) | ORAL | Status: DC

## 2015-02-15 NOTE — Progress Notes (Signed)
   Subjective:    Patient ID: Janet Pierce, female    DOB: 06-Sep-1950, 64 y.o.   MRN: [id number]  HPI abd pain- pt reports periumbilical pain, 'like a hunger pain'.  Pt stopped her Dexilant.  Pain improves w/ food but returns almost immediately.  Increased gas and belching.  Recent lab work WNL.  Mild nausea, no vomiting.   Review of Systems For ROS see HPI     Objective:   Physical Exam  Constitutional: She is oriented to person, place, and time. She appears well-developed and well-nourished. No distress.  HENT:  Head: Normocephalic and atraumatic.  Cardiovascular: Normal rate, regular rhythm and normal heart sounds.   Pulmonary/Chest: Effort normal and breath sounds normal. No respiratory distress. She has no wheezes. She has no rales.  Abdominal: Soft. Bowel sounds are normal. She exhibits no distension. There is no tenderness. There is no rebound.  Neurological: She is alert and oriented to person, place, and time.  Skin: Skin is warm and dry.  Psychiatric: She has a normal mood and affect. Her behavior is normal.  Vitals reviewed.         Assessment & Plan:

## 2015-02-15 NOTE — Assessment & Plan Note (Signed)
Deteriorated since stopping Dexilant.  Suspect that acid over production is the cause of her periumbilical pain.  Restart PPI.  Reviewed supportive care and red flags that should prompt return.  Pt expressed understanding and is in agreement w/ plan.

## 2015-02-15 NOTE — Progress Notes (Signed)
Pre visit review using our clinic review tool, if applicable. No additional management support is needed unless otherwise documented below in the visit note. 

## 2015-02-15 NOTE — Patient Instructions (Signed)
Follow up as needed We'll notify you of your lab results and make any changes if needed Start the Protonix once daily for the increased acid production (this is in place of the Haslet b/c your insurance prefers this) Start the Carafate as directed to provide a barrier to protect your stomach lining Avoid spicy foods, caffeine, smoking, alcohol Drink plenty of fluids Call with any questions or concerns If you want to join [place] at the new [place] office, any scheduled appointments will automatically transfer and we will see you at [address] [place] [address] [place], [place], [place] [address]  Hang in there!!!

## 2015-02-15 NOTE — Assessment & Plan Note (Signed)
Deteriorated.  Suspect this is gastritis due to increased acid production since stopping PPI.  Check H pylori.  Start carafate to allow healing of gastric lining.  Reviewed lifestyle and dietary modifications that can improve sxs.

## 2015-02-22 ENCOUNTER — Other Ambulatory Visit: Payer: Self-pay | Admitting: Family Medicine

## 2015-05-29 ENCOUNTER — Ambulatory Visit: Payer: BLUE CROSS/BLUE SHIELD | Admitting: Family Medicine

## 2015-05-29 ENCOUNTER — Telehealth: Payer: Self-pay | Admitting: Family Medicine

## 2015-05-29 NOTE — Telephone Encounter (Signed)
Pt was no show this morning for 8:45am appt - per Langley Gauss pt called about 10 min before her appt time and stated she overslept, pt is rescheduled for 2/8 8:30am, charge or no charge?

## 2015-05-30 ENCOUNTER — Encounter: Payer: Self-pay | Admitting: Family Medicine

## 2015-05-30 ENCOUNTER — Ambulatory Visit (INDEPENDENT_AMBULATORY_CARE_PROVIDER_SITE_OTHER): Payer: BLUE CROSS/BLUE SHIELD | Admitting: Family Medicine

## 2015-05-30 VITALS — BP 126/82 | HR 80 | Temp 98.2°F | Ht 64.0 in | Wt 140.8 lb

## 2015-05-30 DIAGNOSIS — M791 Myalgia, unspecified site: Secondary | ICD-10-CM | POA: Insufficient documentation

## 2015-05-30 DIAGNOSIS — H8110 Benign paroxysmal vertigo, unspecified ear: Secondary | ICD-10-CM | POA: Diagnosis not present

## 2015-05-30 DIAGNOSIS — M4120 Other idiopathic scoliosis, site unspecified: Secondary | ICD-10-CM

## 2015-05-30 LAB — CBC WITH DIFFERENTIAL/PLATELET
Basophils Absolute: 0 10*3/uL (ref 0.0–0.1)
Basophils Relative: 1 % (ref 0.0–3.0)
EOS PCT: 2.6 % (ref 0.0–5.0)
Eosinophils Absolute: 0.1 10*3/uL (ref 0.0–0.7)
HCT: 35.3 % — ABNORMAL LOW (ref 36.0–46.0)
HEMOGLOBIN: 11.9 g/dL — AB (ref 12.0–15.0)
Lymphocytes Relative: 46.2 % — ABNORMAL HIGH (ref 12.0–46.0)
Lymphs Abs: 1.8 10*3/uL (ref 0.7–4.0)
MCHC: 33.6 g/dL (ref 30.0–36.0)
MCV: 84.1 fl (ref 78.0–100.0)
MONO ABS: 0.4 10*3/uL (ref 0.1–1.0)
Monocytes Relative: 9.9 % (ref 3.0–12.0)
Neutro Abs: 1.5 10*3/uL (ref 1.4–7.7)
Neutrophils Relative %: 40.3 % — ABNORMAL LOW (ref 43.0–77.0)
Platelets: 295 10*3/uL (ref 150.0–400.0)
RBC: 4.2 Mil/uL (ref 3.87–5.11)
RDW: 13.1 % (ref 11.5–15.5)
WBC: 3.8 10*3/uL — AB (ref 4.0–10.5)

## 2015-05-30 LAB — CK: CK TOTAL: 99 U/L (ref 7–177)

## 2015-05-30 LAB — BASIC METABOLIC PANEL
BUN: 11 mg/dL (ref 6–23)
CHLORIDE: 101 meq/L (ref 96–112)
CO2: 32 mEq/L (ref 19–32)
Calcium: 9.6 mg/dL (ref 8.4–10.5)
Creatinine, Ser: 0.69 mg/dL (ref 0.40–1.20)
GFR: 110.09 mL/min (ref >60.00)
Glucose, Bld: 92 mg/dL (ref 70–99)
POTASSIUM: 3.6 meq/L (ref 3.5–5.1)
SODIUM: 140 meq/L (ref 135–145)

## 2015-05-30 LAB — SEDIMENTATION RATE: SED RATE: 22 mm/h (ref 0–22)

## 2015-05-30 MED ORDER — MELOXICAM 15 MG PO TABS: 15.0000 mg | ORAL_TABLET | Freq: Every day | ORAL | Status: DC

## 2015-05-30 MED ORDER — CYCLOBENZAPRINE HCL 10 MG PO TABS: 10.0000 mg | ORAL_TABLET | Freq: Every day | ORAL | Status: DC

## 2015-05-30 MED ORDER — MECLIZINE HCL 25 MG PO TABS: 25.0000 mg | ORAL_TABLET | Freq: Three times a day (TID) | ORAL | Status: DC | PRN

## 2015-05-30 NOTE — Telephone Encounter (Signed)
Marked to charge and mailing no show letter °

## 2015-05-30 NOTE — Assessment & Plan Note (Addendum)
Chronic problem.  Pt feels this is worsening.  I suspect her spine alignment is contributing to her muscle aches.  Refer to PT for evaluation and tx.  Reviewed supportive care and red flags that should prompt return.  Pt expressed understanding and is in agreement w/ plan.

## 2015-05-30 NOTE — Assessment & Plan Note (Signed)
New.  Pt reports sxs are intermittent and resolve w/ meclizine.  Asymptomatic today.  Encouraged increased hydration.  Meclizine prn.  Reviewed supportive care and red flags that should prompt return.  Pt expressed understanding and is in agreement w/ plan.

## 2015-05-30 NOTE — Patient Instructions (Signed)
Follow up as needed We'll notify you of your lab results and make any changes if needed Drink plenty of fluids Start the Flexeril before bed Take the Mobic once daily for pain and inflammation- take w/ food Continue to heat for muscle pain We'll call you with your physical therapy appt Call with any questions or concerns Hang in there!!!

## 2015-05-30 NOTE — Telephone Encounter (Signed)
Yes- pt needs a charge 

## 2015-05-30 NOTE — Progress Notes (Signed)
   Subjective:    Patient ID: Janet Pierce, female    DOB: Aug 29, 1950, 65 y.o.   MRN: [id number]  HPI Vertigo- sxs started 7-10 days ago.  'will have a wave' come over her.  Took Meclizine w/ good improvement.  Pt reports feeling dehydrated 'all the time'- sxs improve w/ good hydration.  No nausea or vomiting.  Asymptomatic today.  LBP- pt reports she is not able to sleep well due to back pain.  Has been using heating pad w/o relief.  Not taking any meds w/ exception of tylenol.  Waking multiple times nightly.  No radiation of pain into butt or thighs.  Myalgias- pt reports she has body aches in both shoulders and legs.  Ate a banana and took [place] w/ some improvement.  Denies change in physical activity.     Review of Systems For ROS see HPI     Objective:   Physical Exam  Constitutional: She is oriented to person, place, and time. She appears well-developed and well-nourished. No distress.  HENT:  Head: Normocephalic and atraumatic.  Eyes: Conjunctivae and EOM are normal. Pupils are equal, round, and reactive to light.  Neck: Normal range of motion. Neck supple. No thyromegaly present.  Cardiovascular: Normal rate, regular rhythm, normal heart sounds and intact distal pulses.   Pulmonary/Chest: Effort normal and breath sounds normal. No respiratory distress. She has no wheezes. She has no rales.  Musculoskeletal: She exhibits tenderness (TTP over lumbar paraspinal muscles w/ obvious curvature of spine.  TTP over traps bilaterally).  Lymphadenopathy:    She has no cervical adenopathy.  Neurological: She is alert and oriented to person, place, and time. She has normal reflexes. No cranial nerve deficit. Coordination normal.  Skin: Skin is warm and dry.  Psychiatric: She has a normal mood and affect. Her behavior is normal. Thought content normal.  Vitals reviewed.         Assessment & Plan:

## 2015-05-30 NOTE — Progress Notes (Signed)
Pre visit review using our clinic review tool, if applicable. No additional management support is needed unless otherwise documented below in the visit note. 

## 2015-05-30 NOTE — Assessment & Plan Note (Signed)
Pt w/ multiple muscle aches.  I suspect her neck and shoulder pain is due to her notable scoliosis and subsequent spasm.  Check labs to r/o rhabdo or causes of inflammation.  Start scheduled NSAID and muscle relaxer.  Reviewed supportive care and red flags that should prompt return.  Pt expressed understanding and is in agreement w/ plan.

## 2015-05-31 ENCOUNTER — Encounter: Payer: Self-pay | Admitting: General Practice

## 2015-06-05 ENCOUNTER — Other Ambulatory Visit: Payer: Self-pay | Admitting: Family Medicine

## 2015-06-05 NOTE — Telephone Encounter (Signed)
Medication filled to pharmacy as requested.   

## 2015-06-05 NOTE — Telephone Encounter (Signed)
Please advise, pt was given #5 tablets on 05/30/15 with 0 refills? but SIG says to take every night?

## 2015-06-19 ENCOUNTER — Ambulatory Visit: Payer: BLUE CROSS/BLUE SHIELD | Attending: Family Medicine | Admitting: Physical Therapy

## 2015-06-19 DIAGNOSIS — M5442 Lumbago with sciatica, left side: Secondary | ICD-10-CM | POA: Diagnosis not present

## 2015-06-19 DIAGNOSIS — R262 Difficulty in walking, not elsewhere classified: Secondary | ICD-10-CM | POA: Insufficient documentation

## 2015-06-19 DIAGNOSIS — R29898 Other symptoms and signs involving the musculoskeletal system: Secondary | ICD-10-CM | POA: Diagnosis present
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
## 2015-06-25 ENCOUNTER — Ambulatory Visit: Payer: BLUE CROSS/BLUE SHIELD | Attending: Family Medicine | Admitting: Physical Therapy

## 2015-06-25 DIAGNOSIS — M5442 Lumbago with sciatica, left side: Secondary | ICD-10-CM

## 2015-06-25 DIAGNOSIS — R29898 Other symptoms and signs involving the musculoskeletal system: Secondary | ICD-10-CM | POA: Diagnosis present

## 2015-06-25 DIAGNOSIS — R262 Difficulty in walking, not elsewhere classified: Secondary | ICD-10-CM | POA: Diagnosis present
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
## 2015-06-27 ENCOUNTER — Ambulatory Visit: Payer: BLUE CROSS/BLUE SHIELD | Admitting: Physical Therapy

## 2015-06-27 DIAGNOSIS — R262 Difficulty in walking, not elsewhere classified: Secondary | ICD-10-CM

## 2015-06-27 DIAGNOSIS — M5442 Lumbago with sciatica, left side: Secondary | ICD-10-CM

## 2015-06-27 DIAGNOSIS — R29898 Other symptoms and signs involving the musculoskeletal system: Secondary | ICD-10-CM
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
## 2015-07-03 ENCOUNTER — Ambulatory Visit: Payer: BLUE CROSS/BLUE SHIELD | Admitting: Physical Therapy

## 2015-07-05 ENCOUNTER — Ambulatory Visit: Payer: BLUE CROSS/BLUE SHIELD

## 2015-07-05 DIAGNOSIS — M5442 Lumbago with sciatica, left side: Secondary | ICD-10-CM | POA: Diagnosis not present

## 2015-07-05 DIAGNOSIS — R29898 Other symptoms and signs involving the musculoskeletal system: Secondary | ICD-10-CM

## 2015-07-05 DIAGNOSIS — R262 Difficulty in walking, not elsewhere classified: Secondary | ICD-10-CM
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
## 2015-07-10 ENCOUNTER — Ambulatory Visit: Payer: BLUE CROSS/BLUE SHIELD

## 2015-07-10 DIAGNOSIS — M5442 Lumbago with sciatica, left side: Secondary | ICD-10-CM | POA: Diagnosis not present

## 2015-07-10 DIAGNOSIS — R262 Difficulty in walking, not elsewhere classified: Secondary | ICD-10-CM

## 2015-07-10 DIAGNOSIS — R29898 Other symptoms and signs involving the musculoskeletal system: Secondary | ICD-10-CM
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
## 2015-07-13 ENCOUNTER — Ambulatory Visit: Payer: BLUE CROSS/BLUE SHIELD | Admitting: Physical Therapy

## 2015-07-17 ENCOUNTER — Encounter: Payer: Self-pay | Admitting: Rehabilitative and Restorative Service Providers"

## 2015-07-17 ENCOUNTER — Ambulatory Visit: Payer: BLUE CROSS/BLUE SHIELD | Admitting: Rehabilitative and Restorative Service Providers"

## 2015-07-17 DIAGNOSIS — R262 Difficulty in walking, not elsewhere classified: Secondary | ICD-10-CM

## 2015-07-17 DIAGNOSIS — M5442 Lumbago with sciatica, left side: Secondary | ICD-10-CM | POA: Diagnosis not present

## 2015-07-17 DIAGNOSIS — R29898 Other symptoms and signs involving the musculoskeletal system: Secondary | ICD-10-CM
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
## 2015-07-24 ENCOUNTER — Ambulatory Visit: Payer: BLUE CROSS/BLUE SHIELD | Attending: Family Medicine

## 2015-07-24 DIAGNOSIS — R262 Difficulty in walking, not elsewhere classified: Secondary | ICD-10-CM

## 2015-07-24 DIAGNOSIS — R29898 Other symptoms and signs involving the musculoskeletal system: Secondary | ICD-10-CM

## 2015-07-24 DIAGNOSIS — M5442 Lumbago with sciatica, left side: Secondary | ICD-10-CM | POA: Diagnosis not present
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
## 2015-07-26 ENCOUNTER — Ambulatory Visit (INDEPENDENT_AMBULATORY_CARE_PROVIDER_SITE_OTHER): Payer: BLUE CROSS/BLUE SHIELD | Admitting: Medical

## 2015-07-26 ENCOUNTER — Encounter: Payer: Self-pay | Admitting: Medical

## 2015-07-26 ENCOUNTER — Ambulatory Visit: Payer: BLUE CROSS/BLUE SHIELD | Admitting: Physical Therapy

## 2015-07-26 ENCOUNTER — Telehealth: Payer: Self-pay | Admitting: *Deleted

## 2015-07-26 VITALS — BP 139/76 | HR 81 | Temp 98.3°F | Resp 14

## 2015-07-26 DIAGNOSIS — R5383 Other fatigue: Secondary | ICD-10-CM | POA: Diagnosis not present

## 2015-07-26 DIAGNOSIS — M25511 Pain in right shoulder: Secondary | ICD-10-CM

## 2015-07-26 DIAGNOSIS — K219 Gastro-esophageal reflux disease without esophagitis: Secondary | ICD-10-CM

## 2015-07-26 DIAGNOSIS — M546 Pain in thoracic spine: Secondary | ICD-10-CM | POA: Diagnosis not present

## 2015-07-26 DIAGNOSIS — R0789 Other chest pain: Secondary | ICD-10-CM

## 2015-07-26 DIAGNOSIS — M25512 Pain in left shoulder: Secondary | ICD-10-CM

## 2015-07-26 MED ORDER — GI COCKTAIL ~~LOC~~
30.0000 mL | Freq: Once | ORAL | Status: AC
  Administered 2015-07-26: 30 mL via ORAL

## 2015-07-26 MED ORDER — RANITIDINE HCL 150 MG PO CAPS: 150.0000 mg | ORAL_CAPSULE | Freq: Two times a day (BID) | ORAL | Status: DC

## 2015-07-26 NOTE — Progress Notes (Signed)
Subjective:    Patient ID: Janet Pierce, female    DOB: May 31, 1950, 65 y.o.   MRN: [id number]  HPI   Pt presented to clinic as a walk-in c/o fullness and heaviness in her head x2 days. She reports she has not slept well for the past 2 nights due to symptoms. She thought she might be dehydrated, so she tried to increase her water intake, but this has not helped and her water intake has actually been decreased because of her not feeling well. She thinks she drinks 24-36 oz water/day. VSS and pt in no distress at time of triage. She denies dizziness, numbness of extremities, and SOB. Endorses chest "achiness" over the last week that feels like indigestion; it is worse at night when she is laying down and resolves completely during the day. She stopped taking her carafate approx 1 week ago. She c/o achiness/soreness in arms/shoulders. Reports this is a chronic problem for her and she was seen by therapy (outpt rehab) on Tuesday 07/24/15. She held her meloxicam this morning thinking that it may be contributing to symptoms. She also used Flonase today, which she normally has not been using.  Above is a note from triage.  Pt has faint slight chest achiness in middle of the chest. This comes and goes every day in last week. Both shoulders hurt as well. Pt has done some PT exercises recenlty as well(this was for her back). Pt is burping and belching. Pt is taking protonix. Pt has not taken any sucraflate. Pt is former smoker. Stopped smoking last year. Pt has history of hyperlipidemia.  This achiness is improved when she belches.    No chest achiness when she breaths. Some pain occasionally in her back.    Just started to feel fatigue over last week. Patient denies any left arm pain today. But occasionally will get some pain in her left arm. She states has arthritis in he left arm.  Pt has some history of chest pain in past and evaluated in the ED. Work up was negative.  Told staff head heaviness.  Did not mention to me. No neurologic type symptoms reported.  Pt did belch after GI cocktail. Pt states her chest discomfort resolved completely.    Review of Systems  Constitutional: Positive for fatigue. Negative for fever and chills.  Respiratory: Negative for cough, chest tightness, shortness of breath and wheezing.   Cardiovascular: Negative for palpitations.       Atypical chest dicomfort.  Gastrointestinal: Negative for abdominal pain.  Musculoskeletal: Positive for back pain.       Some upper back pain. Hx of and some scoliosis.  Some shoulder pain on both sides.   Skin: Negative for rash.  Hematological: Negative for adenopathy. Does not bruise/bleed easily.   Past Medical History  Diagnosis Date  . Retinal detachment     left eye prosthesis  . Scoliosis   . Hypertension   . Glaucoma   . Hyperlipidemia   . Arthritis     Social History   Social History  . Marital Status: Legally Separated    Spouse Name: N/A  . Number of Children: 2  . Years of Education: N/A   Occupational History  . Water quality scientist one   Social History Main Topics  . Smoking status: Current Some Day Smoker -- 0.25 packs/day    Types: Cigarettes  . Smokeless tobacco: Never Used     Comment: using nicorette gum  . Alcohol Use:  0.0 oz/week    0 Standard drinks or equivalent per week     Comment: occasionally  . Drug Use: No  . Sexual Activity: Not on file   Other Topics Concern  . Not on file   Social History Narrative    Past Surgical History  Procedure Laterality Date  . Retinal detachment surgery    . Lipoma excision    . Tonsillectomy    . Eye surgery Right     glucoma    Family History  Problem Relation Age of Onset  . Lung cancer Mother   . Hypertension Mother   . Bone cancer Mother   . Diabetes Maternal Grandmother   . Colon cancer Neg Hx     No Known Allergies  Current Outpatient Prescriptions on File Prior to Visit  Medication Sig Dispense Refill   . acetaminophen (TYLENOL) 500 MG tablet Take 500 mg by mouth every 6 (six) hours as needed for mild pain or moderate pain.    Marland Kitchen amLODipine (NORVASC) 10 MG tablet TAKE 1 TABLET BY MOUTH EVERY DAY BEFORE BREAKFAST 30 tablet 5  . atorvastatin (LIPITOR) 20 MG tablet TK 1 T PO QD  1  . bimatoprost (LUMIGAN) 0.01 % SOLN Place 1 drop into both eyes at bedtime.    . fluticasone (FLONASE) 50 MCG/ACT nasal spray Place 2 sprays into both nostrils daily. 16 g 6  . ibuprofen (ADVIL,MOTRIN) 600 MG tablet Take 600 mg by mouth every 6 (six) hours as needed. Reported on 07/26/2015    . meloxicam (MOBIC) 15 MG tablet Take 1 tablet (15 mg total) by mouth daily. 30 tablet 1  . Multiple Vitamin (MULTIVITAMIN WITH MINERALS) TABS Take 2 tablets by mouth daily.     . pantoprazole (PROTONIX) 40 MG tablet Take 1 tablet (40 mg total) by mouth daily. 30 tablet 6  . SIMBRINZA 1-0.2 % SUSP Place 1 drop into the right eye 2 (two) times daily.     . timolol (TIMOPTIC) 0.5 % ophthalmic solution Place 1 drop into the right eye 2 (two) times daily.     . cyclobenzaprine (FLEXERIL) 10 MG tablet TAKE 1 TABLET BY MOUTH AT BEDTIME (Patient not taking: Reported on 07/26/2015) 30 tablet 1  . meclizine (ANTIVERT) 25 MG tablet Take 1 tablet (25 mg total) by mouth 3 (three) times daily as needed for dizziness. (Patient not taking: Reported on 07/26/2015) 30 tablet 0  . potassium chloride (K-DUR,KLOR-CON) 10 MEQ tablet TAKE 1 TABLET(10 MEQ) BY MOUTH EVERY DAY (Patient not taking: Reported on 07/26/2015) 30 tablet 0  . sucralfate (CARAFATE) 1 G tablet Take 1 tablet (1 g total) by mouth 4 (four) times daily -  with meals and at bedtime. (Patient not taking: Reported on 07/26/2015) 120 tablet 1   No current facility-administered medications on file prior to visit.    BP 139/76 mmHg  Pulse 81  Temp(Src) 98.3 F (36.8 C) (Oral)  Resp 14  SpO2 100%       Objective:   Physical Exam   General Mental Status- Alert. General Appearance- Not in  acute distress.   Skin General: Color- Normal Color. Moisture- Normal Moisture.  Neck Carotid Arteries- Normal color. Moisture- Normal Moisture. No carotid bruits. No JVD.  Chest and Lung Exam Auscultation: Breath Sounds:-Normal.  Cardiovascular Auscultation:Rythm- Regular. Murmurs & Other Heart Sounds:Auscultation of the heart reveals- No Murmurs.  Abdomen Inspection:-Inspeection Normal. Palpation/Percussion:Note:No mass. Palpation and Percussion of the abdomen reveal- Non Tender, Non Distended + BS, no rebound or  guarding.   Neurologic Cranial Nerve exam:- CN III-XII intact(No nystagmus), symmetric smile. Strength:- 5/5 equal and symmetric strength both upper and lower extremities.  Anterior chest- mild tenderness to palpation costochondrol junction. Mild pain on deep inspiration.     Assessment & Plan:  Your chest pain and epigastric pain went away completley with gi cocktail. Your ekg looked same as before as before today. Too late to send any studies out tonight. So if any recurrent chest symptoms then ED evaluation.   I will go ahead and refer you to cardiologist for evaluation since on review some recurrent intermittent chest pain in past.  For fatigue I want you to get labs cbc, cmp, and tsh. Tomorrow in the am.  Also will get sed rate. Pain in shoulders seem to come on with PT per patient.   For gerd add ranitidine to protonix since cocktail resolved symptoms.  Follow up in 7 days or as needed

## 2015-07-26 NOTE — Telephone Encounter (Addendum)
Pt presented to clinic as a walk-in c/o fullness and heaviness in her head x2 days. She reports she has not slept well for the past 2 nights due to symptoms. She thought she might be dehydrated, so she tried to increase her water intake, but this has not helped and her water intake has actually been decreased because of her not feeling well. She thinks she drinks 24-36 oz water/day. VSS and pt in no distress at time of triage. She denies dizziness, numbness of extremities, and SOB. Endorses chest "achiness" over the last week that feels like indigestion; it is worse at night when she is laying down and resolves completely during the day. She stopped taking her carafate approx 1 week ago. She c/o achiness/soreness in arms/shoulders. Reports this is a chronic problem for her and she was seen by therapy (outpt rehab) on Tuesday 07/24/15. She held her meloxicam this morning thinking that it may be contributing to symptoms. She also used Flonase today, which she normally has not been using.  VS:  BP: 139/76 O2: 100 P: 81 T: 98.3 RR: 14  Placed on schedule w/ Mackie Pai at 3:45pm today.

## 2015-07-26 NOTE — Progress Notes (Signed)
Pre visit review using our clinic review tool, if applicable. No additional management support is needed unless otherwise documented below in the visit note. 

## 2015-07-26 NOTE — Patient Instructions (Addendum)
Your chest pain and epigastric pain went away completley with gi cocktail. Your ekg looked same as before as before today. Too late to send any studies out tonight. So if any recurrent chest symptoms then ED evaluation.   I will go ahead and refer you to cardiologist for evaluation since on review some recurrent intermittent chest pain in past.  For fatigue I want you to get labs cbc, cmp, and tsh. Tomorrow in am.  Also will get sed rate. Pain in shoulders seem to come on with PT per patient.   For gerd add ranitidine to protonix since cocktail resolved symptoms.  Follow up in 7 days or as needed

## 2015-07-27 ENCOUNTER — Other Ambulatory Visit (INDEPENDENT_AMBULATORY_CARE_PROVIDER_SITE_OTHER): Payer: BLUE CROSS/BLUE SHIELD

## 2015-07-27 ENCOUNTER — Telehealth: Payer: Self-pay | Admitting: Family Medicine

## 2015-07-27 DIAGNOSIS — M25511 Pain in right shoulder: Secondary | ICD-10-CM | POA: Diagnosis not present

## 2015-07-27 DIAGNOSIS — M546 Pain in thoracic spine: Secondary | ICD-10-CM

## 2015-07-27 DIAGNOSIS — R5383 Other fatigue: Secondary | ICD-10-CM

## 2015-07-27 DIAGNOSIS — M25512 Pain in left shoulder: Secondary | ICD-10-CM | POA: Diagnosis not present

## 2015-07-27 LAB — CBC
HEMATOCRIT: 34.7 % — AB (ref 36.0–46.0)
HEMOGLOBIN: 11.7 g/dL — AB (ref 12.0–15.0)
MCHC: 33.8 g/dL (ref 30.0–36.0)
MCV: 82.8 fl (ref 78.0–100.0)
Platelets: 324 10*3/uL (ref 150.0–400.0)
RBC: 4.19 Mil/uL (ref 3.87–5.11)
RDW: 13.4 % (ref 11.5–15.5)
WBC: 4.2 10*3/uL (ref 4.0–10.5)

## 2015-07-27 LAB — TSH: TSH: 1.11 u[IU]/mL (ref 0.35–4.50)

## 2015-07-27 LAB — COMPREHENSIVE METABOLIC PANEL
ALK PHOS: 56 U/L (ref 39–117)
ALT: 28 U/L (ref 0–35)
AST: 23 U/L (ref 0–37)
Albumin: 4.3 g/dL (ref 3.5–5.2)
BILIRUBIN TOTAL: 0.7 mg/dL (ref 0.2–1.2)
BUN: 11 mg/dL (ref 6–23)
CALCIUM: 9.7 mg/dL (ref 8.4–10.5)
CO2: 33 meq/L — AB (ref 19–32)
CREATININE: 0.77 mg/dL (ref 0.40–1.20)
Chloride: 101 mEq/L (ref 96–112)
GFR: 96.95 mL/min (ref >60.00)
GLUCOSE: 109 mg/dL — AB (ref 70–99)
Potassium: 3.6 mEq/L (ref 3.5–5.1)
Sodium: 138 mEq/L (ref 135–145)
Total Protein: 7.5 g/dL (ref 6.0–8.3)

## 2015-07-27 LAB — SEDIMENTATION RATE: Sed Rate: 22 mm/hr (ref 0–22)

## 2015-07-27 NOTE — Telephone Encounter (Signed)
Pt was advised  That per Janet Pierce that it would be ok to take both the medications. Pt voices understanding.

## 2015-07-27 NOTE — Telephone Encounter (Signed)
Pt called in because she was prescribed Ranitidine by provider. Pt says that she noticed on label to check with provider before taking to be sure that it is okay to take with other medications. Pt says that she also take other medications. Pt would like to be advised if medication is okay to take?   Pt would like a vm if no answer please.     Thanks.   CB: [phone]

## 2015-07-30 ENCOUNTER — Telehealth: Payer: Self-pay | Admitting: Medical

## 2015-07-30 NOTE — Telephone Encounter (Signed)
I think that is ok. But would call and advise her if any severe type chest pain  before appointment then ED evaluation.

## 2015-07-31 NOTE — Telephone Encounter (Signed)
Pt aware left detail message/ok per [facility]

## 2015-08-08 ENCOUNTER — Ambulatory Visit: Payer: BLUE CROSS/BLUE SHIELD

## 2015-08-08 DIAGNOSIS — R262 Difficulty in walking, not elsewhere classified: Secondary | ICD-10-CM

## 2015-08-08 DIAGNOSIS — M5442 Lumbago with sciatica, left side: Secondary | ICD-10-CM | POA: Diagnosis not present
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
## 2015-08-10 ENCOUNTER — Ambulatory Visit: Payer: BLUE CROSS/BLUE SHIELD

## 2015-08-10 DIAGNOSIS — M5442 Lumbago with sciatica, left side: Secondary | ICD-10-CM

## 2015-08-10 DIAGNOSIS — R262 Difficulty in walking, not elsewhere classified: Secondary | ICD-10-CM
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
## 2015-08-20 ENCOUNTER — Ambulatory Visit (INDEPENDENT_AMBULATORY_CARE_PROVIDER_SITE_OTHER): Payer: BLUE CROSS/BLUE SHIELD | Admitting: Internal Medicine

## 2015-08-20 ENCOUNTER — Encounter: Payer: Self-pay | Admitting: Internal Medicine

## 2015-08-20 VITALS — BP 124/64 | HR 76 | Ht 64.0 in | Wt 141.0 lb

## 2015-08-20 DIAGNOSIS — R079 Chest pain, unspecified: Secondary | ICD-10-CM | POA: Diagnosis not present

## 2015-08-20 DIAGNOSIS — R011 Cardiac murmur, unspecified: Secondary | ICD-10-CM | POA: Diagnosis not present

## 2015-08-20 NOTE — Progress Notes (Signed)
Cardiology Office Note   Date:  08/20/2015   ID:  Laycie Hause, DOB 1950-08-08, MRN [id number]  PCP:  [name], MD  Cardiologist:   Dorris Carnes, MD   Chief Complaint  Patient presents with  . New Patient (Initial Visit)    feels some symptoms started when started using nicorette gum to stop smoking. some days feels finen but others have chest discomfort.    Pt referred for CP     History of Present Illness: Janet Pierce is a 65 y.o. female with a history of chest tightness  Seen by Dr Harvie Heck in April  Complained of ache in chest  Came and went every day  Both shoulders with ache Burping and belching   Also noted fatigue with exertion  Just doesn't have the get up and go that she used to have     Patient says symptoms come an go  She has quit using nicorette gom  Not smoking         Outpatient Prescriptions Prior to Visit  Medication Sig Dispense Refill  . acetaminophen (TYLENOL) 500 MG tablet Take 500 mg by mouth every 6 (six) hours as needed for mild pain or moderate pain.    Marland Kitchen amLODipine (NORVASC) 10 MG tablet TAKE 1 TABLET BY MOUTH EVERY DAY BEFORE BREAKFAST 30 tablet 5  . atorvastatin (LIPITOR) 20 MG tablet TK 1 T PO QD  1  . bimatoprost (LUMIGAN) 0.01 % SOLN Place 1 drop into both eyes at bedtime.    . meclizine (ANTIVERT) 25 MG tablet Take 1 tablet (25 mg total) by mouth 3 (three) times daily as needed for dizziness. 30 tablet 0  . meloxicam (MOBIC) 15 MG tablet Take 1 tablet (15 mg total) by mouth daily. 30 tablet 1  . Multiple Vitamin (MULTIVITAMIN WITH MINERALS) TABS Take 2 tablets by mouth daily.     . pantoprazole (PROTONIX) 40 MG tablet Take 1 tablet (40 mg total) by mouth daily. 30 tablet 6  . potassium chloride (K-DUR,KLOR-CON) 10 MEQ tablet TAKE 1 TABLET(10 MEQ) BY MOUTH EVERY DAY 30 tablet 0  . ranitidine (ZANTAC) 150 MG capsule Take 1 capsule (150 mg total) by mouth 2 (two) times daily. 60 capsule 0  . SIMBRINZA 1-0.2 % SUSP Place 1 drop into the  right eye 2 (two) times daily.     . timolol (TIMOPTIC) 0.5 % ophthalmic solution Place 1 drop into the right eye 2 (two) times daily.     . cyclobenzaprine (FLEXERIL) 10 MG tablet TAKE 1 TABLET BY MOUTH AT BEDTIME (Patient not taking: Reported on 08/20/2015) 30 tablet 1  . fluticasone (FLONASE) 50 MCG/ACT nasal spray Place 2 sprays into both nostrils daily. (Patient not taking: Reported on 08/20/2015) 16 g 6  . ibuprofen (ADVIL,MOTRIN) 600 MG tablet Take 600 mg by mouth every 6 (six) hours as needed. Reported on 08/20/2015    . sucralfate (CARAFATE) 1 G tablet Take 1 tablet (1 g total) by mouth 4 (four) times daily -  with meals and at bedtime. (Patient not taking: Reported on 08/20/2015) 120 tablet 1   No facility-administered medications prior to visit.     Allergies:   Review of patient's allergies indicates no known allergies.   Past Medical History  Diagnosis Date  . Retinal detachment     left eye prosthesis  . Scoliosis   . Hypertension   . Glaucoma   . Hyperlipidemia   . Arthritis     Past Surgical History  Procedure  Laterality Date  . Retinal detachment surgery    . Lipoma excision    . Tonsillectomy    . Eye surgery Right     glucoma     Social History:  The patient  reports that she quit smoking about 3 weeks ago. Her smoking use included Cigarettes. She smoked 0.25 packs per day. She has never used smokeless tobacco. She reports that she drinks alcohol. She reports that she does not use illicit drugs.   Family History:  The patient's family history includes Bone cancer in her mother; Diabetes in her maternal grandmother; Hypertension in her mother; Lung cancer in her mother. There is no history of Colon cancer.    ROS:  Please see the history of present illness. All other systems are reviewed and  Negative to the above problem except as noted.    PHYSICAL EXAM: VS:  BP 124/64 mmHg  Pulse 76  Ht 5\' 4"  (1.626 m)  Wt 141 lb (63.957 kg)  BMI 24.19 kg/m2  GEN: Well  nourished, well developed, in no acute distress HEENT: normal Neck: no JVD, carotid bruits, or masses Cardiac: RRR;  I-II/VY systolic murmur LSB rubs, or gallops,no edema  Respiratory:  clear to auscultation bilaterally, normal work of breathing GI: soft, nontender, nondistended, + BS  No hepatomegaly  MS: no deformity Moving all extremities   Skin: warm and dry, no rash Neuro:  Strength and sensation are intact Psych: euthymic mood, full affect   EKG:  EKG is not  ordered today.   Lipid Panel    Component Value Date/Time   CHOL 158 06/01/2014 1002   TRIG 84.0 06/01/2014 1002   HDL 49.50 06/01/2014 1002   CHOLHDL 3 06/01/2014 1002   VLDL 16.8 06/01/2014 1002   LDLCALC 92 06/01/2014 1002   LDLDIRECT 134.8 01/27/2013 1516      Wt Readings from Last 3 Encounters:  08/20/15 141 lb (63.957 kg)  05/30/15 140 lb 12.8 oz (63.866 kg)  02/15/15 138 lb 12.8 oz (62.959 kg)      ASSESSMENT AND PLAN:  1  Chest pain  I am not convinced pains represent angain  But she says her energy is down  I would recomm echo (given murmur, and to evaluate diastolic function) as well as stress echo  F/U based on results  2.  Murmur Sched for echo    3  HL  ON lipitor  COntinue     Signed, Dorris Carnes, MD  08/20/2015 3:09 PM    [facility] [place], [place], [place]  [address] Phone: [phone]; Fax: [phone]

## 2015-08-20 NOTE — Patient Instructions (Signed)
Your physician recommends that you continue on your current medications as directed. Please refer to the Current Medication list given to you today.  Your physician has requested that you have an echocardiogram. Echocardiography is a painless test that uses sound waves to create images of your heart. It provides your doctor with information about the size and shape of your heart and how well your heart's chambers and valves are working. This procedure takes approximately one hour. There are no restrictions for this procedure.  Your physician has requested that you have a stress echocardiogram. For further information please visit [email or web address]. Please follow instruction sheet as given.  Follow up with your physician will depend on test results.

## 2015-08-22 ENCOUNTER — Other Ambulatory Visit: Payer: Self-pay | Admitting: General Practice

## 2015-08-22 MED ORDER — ATORVASTATIN CALCIUM 20 MG PO TABS: ORAL_TABLET | ORAL | Status: DC

## 2015-08-24 ENCOUNTER — Ambulatory Visit: Payer: BLUE CROSS/BLUE SHIELD | Admitting: Internal Medicine

## 2015-08-28 ENCOUNTER — Other Ambulatory Visit: Payer: Self-pay | Admitting: Family Medicine

## 2015-08-28 NOTE — Telephone Encounter (Signed)
Medication filled to pharmacy as requested.   

## 2015-09-06 ENCOUNTER — Telehealth (HOSPITAL_COMMUNITY): Payer: Self-pay

## 2015-09-06 NOTE — Telephone Encounter (Signed)
Left message on voicemail per DPR in reference to upcoming appointment scheduled on 09/11/2015 at 2:30 with detailed instructions given per Myocardial Perfusion Study Information Sheet for the test. LM to arrive 15 minutes early, and that it is imperative to arrive on time for appointment to keep from having the test rescheduled. If you need to cancel or reschedule your appointment, please call the office within 24 hours of your appointment. Failure to do so may result in a cancellation of your appointment, and a $50 no show fee. Phone number given for call back for any questions.

## 2015-09-09 ENCOUNTER — Other Ambulatory Visit: Payer: Self-pay | Admitting: Family Medicine

## 2015-09-10 NOTE — Telephone Encounter (Signed)
Medication filled to pharmacy as requested.   

## 2015-09-11 ENCOUNTER — Other Ambulatory Visit: Payer: Self-pay

## 2015-09-11 ENCOUNTER — Other Ambulatory Visit (HOSPITAL_COMMUNITY): Payer: BLUE CROSS/BLUE SHIELD

## 2015-09-11 ENCOUNTER — Ambulatory Visit (HOSPITAL_COMMUNITY): Payer: BLUE CROSS/BLUE SHIELD | Attending: Cardiology

## 2015-09-11 ENCOUNTER — Ambulatory Visit (HOSPITAL_BASED_OUTPATIENT_CLINIC_OR_DEPARTMENT_OTHER): Payer: BLUE CROSS/BLUE SHIELD

## 2015-09-11 DIAGNOSIS — R079 Chest pain, unspecified: Secondary | ICD-10-CM | POA: Insufficient documentation

## 2015-09-11 DIAGNOSIS — E785 Hyperlipidemia, unspecified: Secondary | ICD-10-CM | POA: Insufficient documentation

## 2015-09-11 DIAGNOSIS — I10 Essential (primary) hypertension: Secondary | ICD-10-CM | POA: Insufficient documentation

## 2015-09-11 DIAGNOSIS — R011 Cardiac murmur, unspecified: Secondary | ICD-10-CM

## 2015-09-11 DIAGNOSIS — Z87891 Personal history of nicotine dependence: Secondary | ICD-10-CM | POA: Insufficient documentation

## 2015-09-11 DIAGNOSIS — I351 Nonrheumatic aortic (valve) insufficiency: Secondary | ICD-10-CM | POA: Insufficient documentation

## 2015-09-11 DIAGNOSIS — R0989 Other specified symptoms and signs involving the circulatory and respiratory systems: Secondary | ICD-10-CM

## 2015-09-12 ENCOUNTER — Other Ambulatory Visit (HOSPITAL_COMMUNITY): Payer: BLUE CROSS/BLUE SHIELD

## 2015-10-10 ENCOUNTER — Telehealth: Payer: Self-pay | Admitting: Family Medicine

## 2015-10-10 NOTE — Telephone Encounter (Signed)
[facility] Day - Client TELEPHONE ADVICE RECORD [facility]  Patient Name: Janet Pierce  DOB: 08-10-1950    Initial Comment Caller contracted head lice, has questions . The shampoo was overpowering    Nurse Assessment  Nurse: Wayne Sever, RN, Tillie Rung Date/Time (Eastern Time): 10/10/2015 2:23:10 PM  Confirm and document reason for call. If symptomatic, describe symptoms. You must click the next button to save text entered. ---Caller states she had contracted lice and she tried something over the counter. She still has the shampoo but does not think she left it in long enough  Has the patient traveled out of the country within the last 30 days? ---Not Applicable  Does the patient have any new or worsening symptoms? ---Yes  Will a triage be completed? ---Yes  Related visit to physician within the last 2 weeks? ---No  Does the PT have any chronic conditions? (i.e. diabetes, asthma, etc.) ---Yes  List chronic conditions. ---HTN, Glaucoma, High Cholesterol  Is this a behavioral health or substance abuse call? ---No     Guidelines    Guideline Title Affirmed Question Affirmed Notes  Head Lice Nix failed to cure head lice    Final Disposition User   [place], RN, Tillie Rung    Disagree/Comply: Comply

## 2015-10-10 NOTE — Telephone Encounter (Signed)
Noted.  Home care was advised.

## 2015-10-26 ENCOUNTER — Ambulatory Visit (INDEPENDENT_AMBULATORY_CARE_PROVIDER_SITE_OTHER): Payer: BLUE CROSS/BLUE SHIELD | Admitting: Family Medicine

## 2015-10-26 ENCOUNTER — Encounter: Payer: Self-pay | Admitting: Family Medicine

## 2015-10-26 VITALS — BP 131/83 | HR 88 | Temp 98.1°F | Resp 17 | Wt 147.4 lb

## 2015-10-26 DIAGNOSIS — R631 Polydipsia: Secondary | ICD-10-CM

## 2015-10-26 DIAGNOSIS — B852 Pediculosis, unspecified: Secondary | ICD-10-CM

## 2015-10-26 LAB — CBC WITH DIFFERENTIAL/PLATELET
Basophils Absolute: 48 {cells}/uL (ref 0–200)
Basophils Relative: 1 %
Eosinophils Absolute: 96 {cells}/uL (ref 15–500)
Eosinophils Relative: 2 %
HCT: 34.7 % — ABNORMAL LOW (ref 35.0–45.0)
Hemoglobin: 11.7 g/dL (ref 11.7–15.5)
Lymphocytes Relative: 45 %
Lymphs Abs: 2160 {cells}/uL (ref 850–3900)
MCH: 27.7 pg (ref 27.0–33.0)
MCHC: 33.7 g/dL (ref 32.0–36.0)
MCV: 82 fL (ref 80.0–100.0)
MPV: 9.4 fL (ref 7.5–12.5)
Monocytes Absolute: 432 {cells}/uL (ref 200–950)
Monocytes Relative: 9 %
Neutro Abs: 2064 {cells}/uL (ref 1500–7800)
Neutrophils Relative %: 43 %
Platelets: 326 K/uL (ref 140–400)
RBC: 4.23 MIL/uL (ref 3.80–5.10)
RDW: 13.6 % (ref 11.0–15.0)
WBC: 4.8 K/uL (ref 3.8–10.8)

## 2015-10-26 LAB — TSH: TSH: 1.94 mIU/L

## 2015-10-26 MED ORDER — PERMETHRIN 1 % EX LIQD: Freq: Once | CUTANEOUS | Status: DC

## 2015-10-26 NOTE — Patient Instructions (Addendum)
Follow up as needed We'll notify you of your lab results and make any changes if needed Keep up the good work on healthy diet and regular exercise- you look great! Use the Permethrin as directed for the lice Call with any questions or concerns Have a great summer!!!

## 2015-10-26 NOTE — Progress Notes (Signed)
   Subjective:    Patient ID: Janet Pierce, female    DOB: November 01, 1950, 65 y.o.   MRN: [id number]  HPI Lice- pt put on old wig and reports she now has lice.  'i've seen it'.  Pt reports she was unable to tolerate OTC lice tx.  Increased thirst- pt always has sensation of being 'dehydrated' but states she's not overtly thirsty.  she will have HAs or 'just feel bad' if she is not paying attention to her water intake.  She reports 36-40 oz daily.     Review of Systems For ROS see HPI     Objective:   Physical Exam  Constitutional: She is oriented to person, place, and time. She appears well-developed and well-nourished. No distress.  HENT:  Head: Normocephalic and atraumatic.  No obvious lice seen today  Neck: Normal range of motion. Neck supple. No thyromegaly present.  Lymphadenopathy:    She has no cervical adenopathy.  Neurological: She is alert and oriented to person, place, and time.  Skin: Skin is warm and dry.  Psychiatric: She has a normal mood and affect. Her behavior is normal. Thought content normal.  Vitals reviewed.         Assessment & Plan:  Lice- no obvious lice seen on exam today.  Based on pt's reports and sxs of itching, will send prescription treatment since she was not able to tolerate OTC tx.  Thirst- unclear if pt is drinking enough water based on her report that she has only had 8 oz today.  Will check labs to determine if pt has diabetes or other metabolic issues.  Encouraged increased water intake, limiting salt intake.

## 2015-10-26 NOTE — Progress Notes (Signed)
Pre visit review using our clinic review tool, if applicable. No additional management support is needed unless otherwise documented below in the visit note. 

## 2015-10-27 LAB — BASIC METABOLIC PANEL
BUN: 9 mg/dL (ref 7–25)
CHLORIDE: 101 mmol/L (ref 98–110)
CO2: 29 mmol/L (ref 20–31)
Calcium: 9.4 mg/dL (ref 8.6–10.4)
Creat: 0.72 mg/dL (ref 0.50–0.99)
Glucose, Bld: 99 mg/dL (ref 65–99)
POTASSIUM: 3.8 mmol/L (ref 3.5–5.3)
SODIUM: 138 mmol/L (ref 135–146)

## 2015-10-29 ENCOUNTER — Encounter: Payer: Self-pay | Admitting: General Practice

## 2015-11-27 ENCOUNTER — Ambulatory Visit (INDEPENDENT_AMBULATORY_CARE_PROVIDER_SITE_OTHER): Payer: BLUE CROSS/BLUE SHIELD | Admitting: Family Medicine

## 2015-11-27 ENCOUNTER — Encounter: Payer: Self-pay | Admitting: Family Medicine

## 2015-11-27 VITALS — BP 140/86 | HR 96 | Temp 97.9°F | Resp 17 | Ht 64.0 in | Wt 144.1 lb

## 2015-11-27 DIAGNOSIS — M542 Cervicalgia: Secondary | ICD-10-CM

## 2015-11-27 DIAGNOSIS — J302 Other seasonal allergic rhinitis: Secondary | ICD-10-CM

## 2015-11-27 MED ORDER — CYCLOBENZAPRINE HCL 10 MG PO TABS: 10.0000 mg | ORAL_TABLET | Freq: Every day | ORAL | 1 refills | Status: DC

## 2015-11-27 MED ORDER — CETIRIZINE HCL 10 MG PO TABS: 10.0000 mg | ORAL_TABLET | Freq: Every day | ORAL | 11 refills | Status: DC

## 2015-11-27 NOTE — Patient Instructions (Signed)
Follow up as needed Drink plenty of fluids- headaches can be worse w/ dehydration Start the Zyrtec (Cetirizine) daily to improve the allergy inflammation Use the Cyclobenzaprine nightly to improve neck and shoulder pain for 1 week and then as needed Tylenol as needed Call with any questions or concerns Hang in there!!!

## 2015-11-27 NOTE — Progress Notes (Signed)
Pre visit review using our clinic review tool, if applicable. No additional management support is needed unless otherwise documented below in the visit note. 

## 2015-11-27 NOTE — Assessment & Plan Note (Signed)
Deteriorated.  I suspect that pt's HA is multifactorial- sinus pressure, untreated seasonal allergies, and neck spasm.  No red flags on hx or PE.  Neuro exam WNL.  Start daily antihistamine and restart Flexeril for neck pain/spasm nightly.  Reviewed supportive care and red flags that should prompt return.  Pt expressed understanding and is in agreement w/ plan.

## 2015-11-27 NOTE — Progress Notes (Signed)
   Subjective:    Patient ID: Janet Pierce, female    DOB: Aug 27, 1950, 65 y.o.   MRN: [id number]  HPI HA- L sided parietal, described as a pressure.  'like something's throbbing'.  Having L neck pain and spasm upon waking.  Some L ear pain.  sxs started ~1 week ago.  Pain will start in the AM and last throughout the day.  Pain eased w/ tylenol last night.  No focal weakness or numbness.  No visual changes.   Review of Systems For ROS see HPI     Objective:   Physical Exam  Constitutional: She is oriented to person, place, and time. She appears well-developed and well-nourished. No distress.  HENT:  Head: Normocephalic and atraumatic.  Right Ear: Tympanic membrane normal.  Left Ear: Tympanic membrane normal.  Nose: Mucosal edema and rhinorrhea present. Right sinus exhibits no maxillary sinus tenderness and no frontal sinus tenderness. Left sinus exhibits no maxillary sinus tenderness and no frontal sinus tenderness.  Mouth/Throat: Mucous membranes are normal. Posterior oropharyngeal erythema (w/ PND) present.  Eyes: Conjunctivae and EOM are normal. Pupils are equal, round, and reactive to light.  Neck: Normal range of motion. Neck supple.  Cardiovascular: Normal rate, regular rhythm and normal heart sounds.   Pulmonary/Chest: Effort normal and breath sounds normal. No respiratory distress. She has no wheezes. She has no rales.  Lymphadenopathy:    She has no cervical adenopathy.  Neurological: She is alert and oriented to person, place, and time.  Skin: Skin is warm and dry.  Psychiatric: She has a normal mood and affect. Her behavior is normal. Thought content normal.  Vitals reviewed.         Assessment & Plan:

## 2015-12-04 ENCOUNTER — Telehealth: Payer: Self-pay | Admitting: Family Medicine

## 2015-12-04 DIAGNOSIS — R51 Headache: Principal | ICD-10-CM

## 2015-12-04 DIAGNOSIS — R519 Headache, unspecified: Secondary | ICD-10-CM

## 2015-12-04 NOTE — Telephone Encounter (Signed)
Please advise, per last OV pt was complaining of neck pain.

## 2015-12-04 NOTE — Telephone Encounter (Signed)
[place] for noncontrast head CT, dx L parietal HA.  Also, please refer to neuro for complete evaluation assuming CT scan will be negative for mass or bleed, etc.

## 2015-12-04 NOTE — Telephone Encounter (Signed)
Pt states that she is still having headaches and asking if she needs to have a CT done.

## 2015-12-04 NOTE — Telephone Encounter (Signed)
Referral to neuro placed, as well as order for CT.

## 2015-12-07 ENCOUNTER — Ambulatory Visit (HOSPITAL_COMMUNITY)
Admission: RE | Admit: 2015-12-07 | Discharge: 2015-12-07 | Disposition: A | Discharge status: Home / Self Care | Payer: BLUE CROSS/BLUE SHIELD | Source: Ambulatory Visit | Attending: Family Medicine | Admitting: Family Medicine

## 2015-12-07 DIAGNOSIS — R51 Headache: Secondary | ICD-10-CM | POA: Insufficient documentation

## 2015-12-07 DIAGNOSIS — R519 Headache, unspecified: Secondary | ICD-10-CM

## 2015-12-11 ENCOUNTER — Encounter: Payer: Self-pay | Admitting: Family Medicine

## 2015-12-12 ENCOUNTER — Encounter: Payer: Self-pay | Admitting: Family Medicine

## 2015-12-26 ENCOUNTER — Other Ambulatory Visit: Payer: Self-pay | Admitting: Family Medicine

## 2016-01-18 ENCOUNTER — Other Ambulatory Visit: Payer: Self-pay | Admitting: Family Medicine

## 2016-01-18 NOTE — Telephone Encounter (Signed)
Pt called checking status on this. Did advise that KT was out of office today and would return on Monday.

## 2016-02-06 ENCOUNTER — Ambulatory Visit: Payer: BLUE CROSS/BLUE SHIELD | Admitting: Neurology

## 2016-02-08 ENCOUNTER — Telehealth: Payer: Self-pay | Admitting: Family Medicine

## 2016-02-08 DIAGNOSIS — R51 Headache: Principal | ICD-10-CM

## 2016-02-08 DIAGNOSIS — R519 Headache, unspecified: Secondary | ICD-10-CM

## 2016-02-08 NOTE — Telephone Encounter (Signed)
Ok to change neuro referral to [facility]
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
## 2016-02-08 NOTE — Telephone Encounter (Signed)
This referral was placed today.  

## 2016-02-15 ENCOUNTER — Ambulatory Visit (INDEPENDENT_AMBULATORY_CARE_PROVIDER_SITE_OTHER): Payer: BLUE CROSS/BLUE SHIELD | Admitting: Family Medicine

## 2016-02-15 ENCOUNTER — Encounter: Payer: Self-pay | Admitting: Family Medicine

## 2016-02-15 VITALS — BP 118/78 | HR 90 | Temp 99.0°F | Resp 16 | Ht 64.0 in | Wt 147.1 lb

## 2016-02-15 DIAGNOSIS — R351 Nocturia: Secondary | ICD-10-CM | POA: Diagnosis not present

## 2016-02-15 LAB — CBC WITH DIFFERENTIAL/PLATELET
BASOS ABS: 47 {cells}/uL (ref 0–200)
Basophils Relative: 1 %
EOS ABS: 94 {cells}/uL (ref 15–500)
Eosinophils Relative: 2 %
HEMATOCRIT: 36 % (ref 35.0–45.0)
HEMOGLOBIN: 12.1 g/dL (ref 11.7–15.5)
LYMPHS ABS: 1974 {cells}/uL (ref 850–3900)
LYMPHS PCT: 42 %
MCH: 27.9 pg (ref 27.0–33.0)
MCHC: 33.6 g/dL (ref 32.0–36.0)
MCV: 83.1 fL (ref 80.0–100.0)
MONO ABS: 329 {cells}/uL (ref 200–950)
MPV: 9 fL (ref 7.5–12.5)
Monocytes Relative: 7 %
NEUTROS PCT: 48 %
Neutro Abs: 2256 cells/uL (ref 1500–7800)
Platelets: 294 10*3/uL (ref 140–400)
RBC: 4.33 MIL/uL (ref 3.80–5.10)
RDW: 13.8 % (ref 11.0–15.0)
WBC: 4.7 10*3/uL (ref 3.8–10.8)

## 2016-02-15 LAB — TSH: TSH: 1.43 mIU/L

## 2016-02-15 LAB — HEMOGLOBIN A1C
HEMOGLOBIN A1C: 5.6 % (ref <5.7)
Mean Plasma Glucose: 114 mg/dL

## 2016-02-15 NOTE — Progress Notes (Signed)
Pre visit review using our clinic review tool, if applicable. No additional management support is needed unless otherwise documented below in the visit note. 

## 2016-02-15 NOTE — Progress Notes (Signed)
   Subjective:    Patient ID: Janet Pierce, female    DOB: Dec 19, 1950, 65 y.o.   MRN: [id number]  HPI Nocturia- pt reports she is urinating 2-3x/night which is interfering w/ her sleep.  Pt will awaken feeling tired and lightheaded, 'like I'm very dehydrated'.  Pt has tried to cut off her water intake after 8pm.  Pt is frequently thirsty during the day.  sxs started a 'couple of months ago'.  Pt is not urinating frequently during the day- only at night.  + dry mouth   Review of Systems For ROS see HPI     Objective:   Physical Exam  Constitutional: She is oriented to person, place, and time. She appears well-developed and well-nourished. No distress.  HENT:  Head: Normocephalic and atraumatic.  Eyes: Conjunctivae and EOM are normal. Pupils are equal, round, and reactive to light.  Neck: Normal range of motion. Neck supple. No thyromegaly present.  Cardiovascular: Normal rate, regular rhythm, normal heart sounds and intact distal pulses.   No murmur heard. Pulmonary/Chest: Effort normal and breath sounds normal. No respiratory distress.  Abdominal: Soft. She exhibits no distension. There is no tenderness.  Musculoskeletal: She exhibits no edema.  Lymphadenopathy:    She has no cervical adenopathy.  Neurological: She is alert and oriented to person, place, and time.  Skin: Skin is warm and dry.  Psychiatric: She has a normal mood and affect. Her behavior is normal.  Vitals reviewed.         Assessment & Plan:  Nocturia- pt reports months of sxs that are interfering w/ her sleep.  Pt reports increased thirst but no increased urination during the day.  Must r/o diabetes and other metabolic abnormalities w/ labs.  Reviewed supportive care and red flags that should prompt return.  Pt expressed understanding and is in agreement w/ plan.

## 2016-02-15 NOTE — Patient Instructions (Signed)
We'll notify you of your lab results and determine the next steps Try and limit your fluid intake after 7pm to avoid getting up at night Limit your caffeine intake- this works as a diuretic and causes increased urination Call with any questions or concerns Hang in there! Happy Halloween!

## 2016-02-16 LAB — BASIC METABOLIC PANEL
BUN: 10 mg/dL (ref 7–25)
CALCIUM: 9.7 mg/dL (ref 8.6–10.4)
CO2: 30 mmol/L (ref 20–31)
CREATININE: 0.71 mg/dL (ref 0.50–0.99)
Chloride: 102 mmol/L (ref 98–110)
GLUCOSE: 105 mg/dL — AB (ref 65–99)
Potassium: 3.8 mmol/L (ref 3.5–5.3)
SODIUM: 141 mmol/L (ref 135–146)

## 2016-02-20 ENCOUNTER — Telehealth: Payer: Self-pay | Admitting: Family Medicine

## 2016-02-20 DIAGNOSIS — K219 Gastro-esophageal reflux disease without esophagitis: Secondary | ICD-10-CM

## 2016-02-20 MED ORDER — PANTOPRAZOLE SODIUM 40 MG PO TBEC: 40.0000 mg | DELAYED_RELEASE_TABLET | Freq: Every day | ORAL | 1 refills | Status: DC

## 2016-02-20 MED ORDER — ATORVASTATIN CALCIUM 20 MG PO TABS: ORAL_TABLET | ORAL | 1 refills | Status: DC

## 2016-02-20 MED ORDER — AMLODIPINE BESYLATE 10 MG PO TABS: ORAL_TABLET | ORAL | 1 refills | Status: DC

## 2016-02-20 NOTE — Telephone Encounter (Signed)
Medication filled to pharmacy as requested.   

## 2016-02-20 NOTE — Telephone Encounter (Signed)
Apolonio Schneiders with [facility] states they have been faxing refill request to [place] for pt to get amlodipine, atorcastatin, and pantoprazole all for a 90 day supply sent to them.

## 2016-03-19 ENCOUNTER — Ambulatory Visit: Payer: BLUE CROSS/BLUE SHIELD | Admitting: Diagnostic Neuroimaging

## 2016-04-24 ENCOUNTER — Ambulatory Visit: Payer: BLUE CROSS/BLUE SHIELD | Admitting: Neurology

## 2016-05-10 ENCOUNTER — Other Ambulatory Visit: Payer: Self-pay | Admitting: Family Medicine

## 2016-05-27 ENCOUNTER — Ambulatory Visit (INDEPENDENT_AMBULATORY_CARE_PROVIDER_SITE_OTHER): Payer: Medicare Other | Admitting: Family Medicine

## 2016-05-27 ENCOUNTER — Encounter: Payer: Self-pay | Admitting: Family Medicine

## 2016-05-27 VITALS — BP 122/83 | Temp 98.4°F | Resp 17 | Ht 64.0 in | Wt 146.4 lb

## 2016-05-27 DIAGNOSIS — R0789 Other chest pain: Secondary | ICD-10-CM

## 2016-05-27 DIAGNOSIS — K219 Gastro-esophageal reflux disease without esophagitis: Secondary | ICD-10-CM | POA: Diagnosis not present

## 2016-05-27 MED ORDER — PANTOPRAZOLE SODIUM 40 MG PO TBEC: 40.0000 mg | DELAYED_RELEASE_TABLET | Freq: Two times a day (BID) | ORAL | 1 refills | Status: DC

## 2016-05-27 MED ORDER — GI COCKTAIL ~~LOC~~
30.0000 mL | Freq: Once | ORAL | Status: AC
  Administered 2016-05-27: 30 mL via ORAL

## 2016-05-27 NOTE — Assessment & Plan Note (Signed)
Deteriorated.  Suspect this is the cause of her chest discomfort/pain as sxs improved s/p GI cocktail in office. Increase Protonix to twice daily and refer back to GI for the endoscopy they had discussed w/ her.  Reviewed lifestyle and dietary modifications that will improve her sxs.  Will follow.

## 2016-05-27 NOTE — Patient Instructions (Signed)
Follow up as needed/scheduled We'll call you with your GI appt Increase the Protonix to twice daily Avoid caffeine, spicy foods, alcohol, etc- this will worsen your symptoms Try and avoid your stomach getting empty and eat throughout the day Call with any questions or concerns Hang in there!!!

## 2016-05-27 NOTE — Progress Notes (Signed)
   Subjective:    Patient ID: Janet Pierce, female    DOB: 1950-04-30, 66 y.o.   MRN: [id number]  HPI 'my chest.  It always feels like there's something there'- pt reports she was exposed to cold air last week and shortly after had sore throat, 'mucous', intermittent cough.  sxs improved w/ mucinex.  Reports she developed chest pain that extended to both shoulders and her back.  Chest pressure improves w/ belching and passing gas.  On Pantoprazole daily.  Pt has seen GI and they recommended a scope ([place]).  Chest pain doesn't worsen w/ exertion.  sxs will worsen in the AM after coffee.  Pt had normal stress ECHO in May.   Review of Systems For ROS see HPI     Objective:   Physical Exam  Constitutional: She is oriented to person, place, and time. She appears well-developed and well-nourished. No distress.  HENT:  Head: Normocephalic and atraumatic.  Eyes: Conjunctivae and EOM are normal. Pupils are equal, round, and reactive to light.  Neck: Normal range of motion. Neck supple. No thyromegaly present.  Cardiovascular: Normal rate, regular rhythm, normal heart sounds and intact distal pulses.   No murmur heard. Pulmonary/Chest: Effort normal and breath sounds normal. No respiratory distress.  Abdominal: Soft. She exhibits no distension. There is no tenderness.  Musculoskeletal: She exhibits no edema.  Lymphadenopathy:    She has no cervical adenopathy.  Neurological: She is alert and oriented to person, place, and time.  Skin: Skin is warm and dry.  Psychiatric: She has a normal mood and affect. Her behavior is normal.  Vitals reviewed.         Assessment & Plan:

## 2016-05-27 NOTE — Progress Notes (Signed)
Pre visit review using our clinic review tool, if applicable. No additional management support is needed unless otherwise documented below in the visit note. 

## 2016-06-06 ENCOUNTER — Ambulatory Visit (INDEPENDENT_AMBULATORY_CARE_PROVIDER_SITE_OTHER): Payer: Medicare Other | Admitting: Family Medicine

## 2016-06-06 ENCOUNTER — Telehealth: Payer: Self-pay | Admitting: Family Medicine

## 2016-06-06 ENCOUNTER — Encounter: Payer: Self-pay | Admitting: Family Medicine

## 2016-06-06 VITALS — BP 122/82 | HR 100 | Temp 98.6°F | Resp 17 | Ht 64.0 in | Wt 145.2 lb

## 2016-06-06 DIAGNOSIS — R82998 Other abnormal findings in urine: Secondary | ICD-10-CM

## 2016-06-06 DIAGNOSIS — R319 Hematuria, unspecified: Secondary | ICD-10-CM

## 2016-06-06 DIAGNOSIS — R300 Dysuria: Secondary | ICD-10-CM | POA: Diagnosis not present

## 2016-06-06 DIAGNOSIS — R8299 Other abnormal findings in urine: Secondary | ICD-10-CM

## 2016-06-06 LAB — POCT URINALYSIS DIPSTICK
Bilirubin, UA: NEGATIVE
Glucose, UA: NEGATIVE
Ketones, UA: NEGATIVE
PROTEIN UA: NEGATIVE
Spec Grav, UA: 1.025
UROBILINOGEN UA: 0.2
pH, UA: 6

## 2016-06-06 MED ORDER — CEPHALEXIN 500 MG PO CAPS: 500.0000 mg | ORAL_CAPSULE | Freq: Two times a day (BID) | ORAL | 0 refills | Status: AC

## 2016-06-06 NOTE — Telephone Encounter (Signed)
Pt informed that this is the same medication. Pt was informed that the cephalexin is the generic for the Keflex. We did not send in two medications as se was informed on the phone.

## 2016-06-06 NOTE — Patient Instructions (Signed)
Follow up as needed/scheduled Start the Keflex twice daily- take w/ food- for the UTI Drink plenty of fluids Call with any questions or concerns Hang in there! Have a great weekend!

## 2016-06-06 NOTE — Progress Notes (Signed)
   Subjective:    Patient ID: Janet Pierce, female    DOB: 01-15-1951, 66 y.o.   MRN: [id number]  HPI Vaginal burning- sxs started 'Sunday or Monday'.  Pt describes a 'pinching' after she finished urinating.  Pt had stinging after urinating.  Denies increased frequency.  Pt denies incomplete emptying.  No fevers.     Review of Systems For ROS see HPI     Objective:   Physical Exam  Constitutional: She is oriented to person, place, and time. She appears well-developed and well-nourished. No distress.  HENT:  Head: Normocephalic and atraumatic.  Genitourinary: Vagina normal. No vaginal discharge found.  Genitourinary Comments: No vaginal abrasions/lacerations, swelling, irritation, or d/c noted on PE  Neurological: She is alert and oriented to person, place, and time.  Skin: Skin is warm and dry.  Psychiatric: She has a normal mood and affect. Her behavior is normal. Thought content normal.  Vitals reviewed.         Assessment & Plan:  Vaginal burning- suspect this is due to pt's UTI as UA is highly suspicious.  Start abx.  Reviewed supportive care and red flags that should prompt return.  Pt expressed understanding and is in agreement w/ plan.

## 2016-06-06 NOTE — Progress Notes (Signed)
Pre visit review using our clinic review tool, if applicable. No additional management support is needed unless otherwise documented below in the visit note. 

## 2016-06-06 NOTE — Telephone Encounter (Signed)
Patient is calling because prescription for Keflex was too expensive ($80).   Was able to pick up Cephalexin.  Thank you,  -LL

## 2016-06-08 LAB — URINE CULTURE

## 2016-06-09 ENCOUNTER — Telehealth: Payer: Self-pay | Admitting: Family Medicine

## 2016-06-09 NOTE — Progress Notes (Signed)
Called pt and lmovm to return call.

## 2016-06-09 NOTE — Telephone Encounter (Signed)
Patient returning call from [place]. She states she is feeling better and medication is working. She has 4 pills left to take.  Thank you

## 2016-06-09 NOTE — Telephone Encounter (Signed)
FYI

## 2016-06-23 ENCOUNTER — Other Ambulatory Visit: Payer: Self-pay | Admitting: General Practice

## 2016-06-23 ENCOUNTER — Encounter: Payer: Self-pay | Admitting: Family Medicine

## 2016-06-23 MED ORDER — MELOXICAM 15 MG PO TABS: ORAL_TABLET | ORAL | 0 refills | Status: DC

## 2016-07-15 ENCOUNTER — Encounter (HOSPITAL_COMMUNITY): Payer: Self-pay | Admitting: Emergency Medicine

## 2016-07-15 ENCOUNTER — Emergency Department (HOSPITAL_COMMUNITY)
Admission: EM | Admit: 2016-07-15 | Discharge: 2016-07-16 | Disposition: A | Discharge status: Home / Self Care | Payer: Medicare Other | Attending: Emergency Medicine | Admitting: Emergency Medicine

## 2016-07-15 DIAGNOSIS — R42 Dizziness and giddiness: Secondary | ICD-10-CM

## 2016-07-15 DIAGNOSIS — R5383 Other fatigue: Secondary | ICD-10-CM

## 2016-07-15 DIAGNOSIS — I10 Essential (primary) hypertension: Secondary | ICD-10-CM | POA: Diagnosis not present

## 2016-07-15 DIAGNOSIS — Z87891 Personal history of nicotine dependence: Secondary | ICD-10-CM | POA: Diagnosis not present

## 2016-07-15 DIAGNOSIS — Z79899 Other long term (current) drug therapy: Secondary | ICD-10-CM | POA: Diagnosis not present

## 2016-07-15 LAB — BASIC METABOLIC PANEL
Anion gap: 5 (ref 5–15)
BUN: 13 mg/dL (ref 6–20)
CHLORIDE: 104 mmol/L (ref 101–111)
CO2: 30 mmol/L (ref 22–32)
Calcium: 9.6 mg/dL (ref 8.9–10.3)
Creatinine, Ser: 0.69 mg/dL (ref 0.44–1.00)
Glucose, Bld: 103 mg/dL — ABNORMAL HIGH (ref 65–99)
POTASSIUM: 3.3 mmol/L — AB (ref 3.5–5.1)
SODIUM: 139 mmol/L (ref 135–145)

## 2016-07-15 LAB — CBC
HEMATOCRIT: 35.5 % — AB (ref 36.0–46.0)
Hemoglobin: 12 g/dL (ref 12.0–15.0)
MCH: 27.7 pg (ref 26.0–34.0)
MCHC: 33.8 g/dL (ref 30.0–36.0)
MCV: 82 fL (ref 78.0–100.0)
PLATELETS: 281 10*3/uL (ref 150–400)
RBC: 4.33 MIL/uL (ref 3.87–5.11)
RDW: 13 % (ref 11.5–15.5)
WBC: 5.3 10*3/uL (ref 4.0–10.5)

## 2016-07-15 LAB — URINALYSIS, ROUTINE W REFLEX MICROSCOPIC
BACTERIA UA: NONE SEEN
BILIRUBIN URINE: NEGATIVE
Glucose, UA: NEGATIVE mg/dL
Ketones, ur: NEGATIVE mg/dL
NITRITE: NEGATIVE
PH: 6 (ref 5.0–8.0)
Protein, ur: NEGATIVE mg/dL
SPECIFIC GRAVITY, URINE: 1.005 (ref 1.005–1.030)

## 2016-07-15 LAB — TROPONIN I

## 2016-07-15 LAB — CBG MONITORING, ED: GLUCOSE-CAPILLARY: 106 mg/dL — AB (ref 65–99)

## 2016-07-15 NOTE — ED Triage Notes (Addendum)
Pt states that she has felt fatigued x 4 days with generalized body aches. Pt states that she did stop taking nicorette on Friday but unsure if this caused the fatigue. Alert and oriented.
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
## 2016-07-16 NOTE — ED Notes (Signed)
Patient was alert, oriented and stable upon discharge. RN went over AVS and patient had no further questions.  

## 2016-07-25 ENCOUNTER — Encounter: Payer: Self-pay | Admitting: Family Medicine

## 2016-08-05 ENCOUNTER — Other Ambulatory Visit: Payer: Self-pay | Admitting: Family Medicine

## 2016-08-28 ENCOUNTER — Ambulatory Visit: Payer: Medicare Other | Admitting: Family Medicine

## 2016-09-04 ENCOUNTER — Encounter: Payer: Self-pay | Admitting: Family Medicine

## 2016-09-04 ENCOUNTER — Ambulatory Visit (INDEPENDENT_AMBULATORY_CARE_PROVIDER_SITE_OTHER): Payer: Medicare Other | Admitting: Family Medicine

## 2016-09-04 VITALS — BP 121/82 | HR 98 | Temp 98.7°F | Resp 16 | Ht 64.0 in | Wt 144.5 lb

## 2016-09-04 DIAGNOSIS — R399 Unspecified symptoms and signs involving the genitourinary system: Secondary | ICD-10-CM

## 2016-09-04 DIAGNOSIS — I10 Essential (primary) hypertension: Secondary | ICD-10-CM

## 2016-09-04 DIAGNOSIS — E785 Hyperlipidemia, unspecified: Secondary | ICD-10-CM | POA: Diagnosis not present

## 2016-09-04 LAB — POCT URINALYSIS DIPSTICK
BILIRUBIN UA: NEGATIVE
Glucose, UA: NEGATIVE
KETONES UA: NEGATIVE
LEUKOCYTES UA: NEGATIVE
NITRITE UA: NEGATIVE
Protein, UA: NEGATIVE
Spec Grav, UA: 1.02 (ref 1.010–1.025)
Urobilinogen, UA: 0.2 E.U./dL
pH, UA: 6 (ref 5.0–8.0)

## 2016-09-04 LAB — LIPID PANEL
Cholesterol: 245 mg/dL — ABNORMAL HIGH (ref 0–200)
HDL: 61.4 mg/dL (ref >39.00)
LDL CALC: 171 mg/dL — AB (ref 0–99)
NonHDL: 183.64
TRIGLYCERIDES: 65 mg/dL (ref 0.0–149.0)
Total CHOL/HDL Ratio: 4
VLDL: 13 mg/dL (ref 0.0–40.0)

## 2016-09-04 LAB — CBC WITH DIFFERENTIAL/PLATELET
BASOS PCT: 1.3 % (ref 0.0–3.0)
Basophils Absolute: 0.1 10*3/uL (ref 0.0–0.1)
EOS ABS: 0.2 10*3/uL (ref 0.0–0.7)
Eosinophils Relative: 3.7 % (ref 0.0–5.0)
HCT: 36.8 % (ref 36.0–46.0)
HEMOGLOBIN: 12.2 g/dL (ref 12.0–15.0)
LYMPHS PCT: 41.7 % (ref 12.0–46.0)
Lymphs Abs: 2 10*3/uL (ref 0.7–4.0)
MCHC: 33.3 g/dL (ref 30.0–36.0)
MCV: 83.9 fl (ref 78.0–100.0)
MONO ABS: 0.5 10*3/uL (ref 0.1–1.0)
Monocytes Relative: 9.4 % (ref 3.0–12.0)
Neutro Abs: 2.2 10*3/uL (ref 1.4–7.7)
Neutrophils Relative %: 43.9 % (ref 43.0–77.0)
Platelets: 330 10*3/uL (ref 150.0–400.0)
RBC: 4.38 Mil/uL (ref 3.87–5.11)
RDW: 13.9 % (ref 11.5–15.5)
WBC: 4.9 10*3/uL (ref 4.0–10.5)

## 2016-09-04 LAB — HEPATIC FUNCTION PANEL
ALT: 19 U/L (ref 0–35)
AST: 16 U/L (ref 0–37)
Albumin: 4.5 g/dL (ref 3.5–5.2)
Alkaline Phosphatase: 77 U/L (ref 39–117)
BILIRUBIN TOTAL: 0.6 mg/dL (ref 0.2–1.2)
Bilirubin, Direct: 0.1 mg/dL (ref 0.0–0.3)
Total Protein: 7.3 g/dL (ref 6.0–8.3)

## 2016-09-04 LAB — TSH: TSH: 1.83 u[IU]/mL (ref 0.35–4.50)

## 2016-09-04 LAB — BASIC METABOLIC PANEL
BUN: 11 mg/dL (ref 6–23)
CHLORIDE: 103 meq/L (ref 96–112)
CO2: 33 meq/L — AB (ref 19–32)
CREATININE: 0.67 mg/dL (ref 0.40–1.20)
Calcium: 9.6 mg/dL (ref 8.4–10.5)
GFR: 113.44 mL/min (ref >60.00)
Glucose, Bld: 74 mg/dL (ref 70–99)
POTASSIUM: 3.5 meq/L (ref 3.5–5.1)
Sodium: 142 mEq/L (ref 135–145)

## 2016-09-04 MED ORDER — CEPHALEXIN 500 MG PO CAPS: 500.0000 mg | ORAL_CAPSULE | Freq: Two times a day (BID) | ORAL | 0 refills | Status: AC

## 2016-09-04 NOTE — Patient Instructions (Signed)
Schedule your complete physical in 6 months We'll notify you of your lab results and make any changes if needed Continue to work on healthy diet and regular exercise Take the Keflex twice daily for likely UTI Drink plenty of fluids Call with any questions or concerns Have a great summer!!!

## 2016-09-04 NOTE — Progress Notes (Signed)
Pre visit review using our clinic review tool, if applicable. No additional management support is needed unless otherwise documented below in the visit note. 

## 2016-09-04 NOTE — Progress Notes (Signed)
   Subjective:    Patient ID: Janet Pierce, female    DOB: 1950-06-19, 66 y.o.   MRN: [id number]  HPI HTN- chronic problem.  Well controlled on Amlodipine.  Denies CP, SOB, HAs, visual changes, edema.  Pt is walking regularly  Hyperlipidemia- chronic problem.  On Lipitor daily.  No abd pain, N/V, myalgias.  Dysuria- sxs started 4-5 days ago.  Denies burning w/ urination but has pinching pain w/ urinating.  Increased frequency.  No increased urgency.  No fevers.   Review of Systems For ROS see HPI     Objective:   Physical Exam  Constitutional: She is oriented to person, place, and time. She appears well-developed and well-nourished. No distress.  HENT:  Head: Normocephalic and atraumatic.  Eyes: Conjunctivae and EOM are normal. Pupils are equal, round, and reactive to light.  Neck: Normal range of motion. Neck supple. No thyromegaly present.  Cardiovascular: Normal rate, regular rhythm, normal heart sounds and intact distal pulses.   No murmur heard. Pulmonary/Chest: Effort normal and breath sounds normal. No respiratory distress.  Abdominal: Soft. She exhibits no distension. There is no tenderness.  Musculoskeletal: She exhibits no edema.  Lymphadenopathy:    She has no cervical adenopathy.  Neurological: She is alert and oriented to person, place, and time.  Skin: Skin is warm and dry.  Psychiatric: She has a normal mood and affect. Her behavior is normal.  Vitals reviewed.         Assessment & Plan:  UTI- pt's sxs and UA consistent w/ infxn.  Start abx.  Reviewed supportive care and red flags that should prompt return.  Pt expressed understanding and is in agreement w/ plan.

## 2016-09-04 NOTE — Assessment & Plan Note (Signed)
Chronic problem.  Well controlled today.  Asymptomatic.  Check labs.  No anticipated med changes. 

## 2016-09-04 NOTE — Assessment & Plan Note (Signed)
Chronic problem.  Tolerating statin w/o difficulty.  Check labs.  Adjust meds prn  

## 2016-09-05 LAB — CULTURE, URINE COMPREHENSIVE

## 2016-09-05 NOTE — Progress Notes (Signed)
Called pt and lmovm to return call.

## 2016-11-17 ENCOUNTER — Telehealth: Payer: Self-pay | Admitting: Family Medicine

## 2016-11-17 MED ORDER — AMLODIPINE BESYLATE 10 MG PO TABS: 10.0000 mg | ORAL_TABLET | Freq: Every day | ORAL | 1 refills | Status: DC

## 2016-11-17 NOTE — Telephone Encounter (Signed)
Pt asking for refill of 1 week on amlodipine to be called into [facility] on [address] while she waits for mail order to come.

## 2016-11-17 NOTE — Telephone Encounter (Signed)
Medication filled to pharmacy as requested.   

## 2016-12-05 ENCOUNTER — Ambulatory Visit (INDEPENDENT_AMBULATORY_CARE_PROVIDER_SITE_OTHER): Payer: Medicare Other | Admitting: Family Medicine

## 2016-12-05 ENCOUNTER — Encounter: Payer: Self-pay | Admitting: Family Medicine

## 2016-12-05 VITALS — BP 128/86 | HR 86 | Temp 99.0°F | Resp 17 | Ht 64.0 in | Wt 143.2 lb

## 2016-12-05 DIAGNOSIS — R5383 Other fatigue: Secondary | ICD-10-CM | POA: Diagnosis not present

## 2016-12-05 DIAGNOSIS — Z720 Tobacco use: Secondary | ICD-10-CM | POA: Diagnosis not present

## 2016-12-05 LAB — CBC WITH DIFFERENTIAL/PLATELET
BASOS PCT: 2.6 % (ref 0.0–3.0)
Basophils Absolute: 0.1 10*3/uL (ref 0.0–0.1)
EOS ABS: 0.1 10*3/uL (ref 0.0–0.7)
Eosinophils Relative: 2.5 % (ref 0.0–5.0)
HCT: 36.1 % (ref 36.0–46.0)
Hemoglobin: 12 g/dL (ref 12.0–15.0)
LYMPHS ABS: 1.4 10*3/uL (ref 0.7–4.0)
Lymphocytes Relative: 37.8 % (ref 12.0–46.0)
MCHC: 33.2 g/dL (ref 30.0–36.0)
MCV: 84.4 fl (ref 78.0–100.0)
MONO ABS: 0.3 10*3/uL (ref 0.1–1.0)
Monocytes Relative: 9.1 % (ref 3.0–12.0)
NEUTROS ABS: 1.8 10*3/uL (ref 1.4–7.7)
Neutrophils Relative %: 48 % (ref 43.0–77.0)
Platelets: 282 10*3/uL (ref 150.0–400.0)
RBC: 4.28 Mil/uL (ref 3.87–5.11)
RDW: 13.1 % (ref 11.5–15.5)
WBC: 3.7 10*3/uL — ABNORMAL LOW (ref 4.0–10.5)

## 2016-12-05 LAB — BASIC METABOLIC PANEL
BUN: 9 mg/dL (ref 6–23)
CALCIUM: 9.2 mg/dL (ref 8.4–10.5)
CHLORIDE: 101 meq/L (ref 96–112)
CO2: 33 meq/L — AB (ref 19–32)
CREATININE: 0.7 mg/dL (ref 0.40–1.20)
GFR: 107.77 mL/min (ref >60.00)
Glucose, Bld: 92 mg/dL (ref 70–99)
Potassium: 4 mEq/L (ref 3.5–5.1)
Sodium: 140 mEq/L (ref 135–145)

## 2016-12-05 LAB — B12 AND FOLATE PANEL
FOLATE: 18.9 ng/mL (ref >5.9)
VITAMIN B 12: 418 pg/mL (ref 211–911)

## 2016-12-05 LAB — HEPATIC FUNCTION PANEL
ALBUMIN: 4 g/dL (ref 3.5–5.2)
ALT: 18 U/L (ref 0–35)
AST: 17 U/L (ref 0–37)
Alkaline Phosphatase: 61 U/L (ref 39–117)
BILIRUBIN TOTAL: 0.7 mg/dL (ref 0.2–1.2)
Bilirubin, Direct: 0.1 mg/dL (ref 0.0–0.3)
Total Protein: 6.5 g/dL (ref 6.0–8.3)

## 2016-12-05 LAB — TSH: TSH: 1.28 u[IU]/mL (ref 0.35–4.50)

## 2016-12-05 LAB — VITAMIN D 25 HYDROXY (VIT D DEFICIENCY, FRACTURES): VITD: 18.88 ng/mL — ABNORMAL LOW (ref 30.00–100.00)

## 2016-12-05 NOTE — Progress Notes (Signed)
   Subjective:    Patient ID: Janet Pierce, female    DOB: Sep 07, 1950, 66 y.o.   MRN: [id number]  HPI 'tired and weak'- pt reports she is not sleeping well, 'is up 4x/night to go the bathroom' b/c 'i'm thirsty and drinking more water'.  Pt reports sxs improve as she drinks water through the day but by 7:30pm 'i'm just so tired'.  sxs started when she quit smoking on 8/5.  Pt reports having some lightheadedness when she wakes up in the AM.  No fevers.  Denies increased stress.   Review of Systems For ROS see HPI     Objective:   Physical Exam  Constitutional: She is oriented to person, place, and time. She appears well-developed and well-nourished. No distress.  HENT:  Head: Normocephalic and atraumatic.  Eyes: Pupils are equal, round, and reactive to light. EOM are normal.  Pale conjunctiva  Neck: Normal range of motion. Neck supple. No thyromegaly present.  Cardiovascular: Normal rate, regular rhythm, normal heart sounds and intact distal pulses.   No murmur heard. Pulmonary/Chest: Effort normal and breath sounds normal. No respiratory distress.  Abdominal: Soft. She exhibits no distension. There is no tenderness.  Musculoskeletal: She exhibits no edema.  Lymphadenopathy:    She has no cervical adenopathy.  Neurological: She is alert and oriented to person, place, and time.  Skin: Skin is warm and dry.  Psychiatric: She has a normal mood and affect. Her behavior is normal.  Vitals reviewed.         Assessment & Plan:

## 2016-12-05 NOTE — Patient Instructions (Signed)
Follow up as needed/scheduled We'll notify you of your lab results and make any changes if needed Continue to work on healthy diet and regular exercise to improve your energy level Make sure you are eating regularly to have enough energy through the day Call with any questions or concerns Hang in there!!!

## 2016-12-05 NOTE — Assessment & Plan Note (Signed)
Pt has hx of this previously.  sxs started <2 weeks ago and coincide when she quit smoking.  May be related to nicotine withdrawal.  Check labs to r/o metabolic causes.  Encouraged healthy diet, regular exercise, regular meals, and sleep hygiene.  Will follow.

## 2016-12-05 NOTE — Assessment & Plan Note (Signed)
Pt has again quit smoking and is using Nicotine gum to help w/ the cravings.  Her fatigue may be related to nicotine withdrawal.  Applauded her efforts.  Will follow.

## 2016-12-05 NOTE — Progress Notes (Signed)
Pre visit review using our clinic review tool, if applicable. No additional management support is needed unless otherwise documented below in the visit note. 

## 2016-12-08 ENCOUNTER — Other Ambulatory Visit: Payer: Self-pay | Admitting: General Practice

## 2016-12-08 MED ORDER — VITAMIN D (ERGOCALCIFEROL) 1.25 MG (50000 UNIT) PO CAPS: 50000.0000 [IU] | ORAL_CAPSULE | ORAL | 0 refills | Status: DC

## 2016-12-09 ENCOUNTER — Other Ambulatory Visit: Payer: Self-pay | Admitting: *Deleted

## 2016-12-09 DIAGNOSIS — E559 Vitamin D deficiency, unspecified: Secondary | ICD-10-CM

## 2016-12-09 MED ORDER — VITAMIN D (ERGOCALCIFEROL) 1.25 MG (50000 UNIT) PO CAPS: 50000.0000 [IU] | ORAL_CAPSULE | ORAL | 0 refills | Status: DC

## 2017-01-13 ENCOUNTER — Encounter: Payer: Self-pay | Admitting: Family Medicine

## 2017-01-13 ENCOUNTER — Telehealth: Payer: Self-pay | Admitting: Family Medicine

## 2017-01-13 ENCOUNTER — Ambulatory Visit (INDEPENDENT_AMBULATORY_CARE_PROVIDER_SITE_OTHER): Payer: Medicare Other | Admitting: Family Medicine

## 2017-01-13 VITALS — BP 116/78 | HR 83 | Temp 98.2°F | Resp 16 | Ht 64.0 in | Wt 143.2 lb

## 2017-01-13 DIAGNOSIS — R109 Unspecified abdominal pain: Secondary | ICD-10-CM

## 2017-01-13 MED ORDER — SUCRALFATE 1 G PO TABS: 1.0000 g | ORAL_TABLET | Freq: Three times a day (TID) | ORAL | 0 refills | Status: DC

## 2017-01-13 MED ORDER — GI COCKTAIL ~~LOC~~
30.0000 mL | Freq: Once | ORAL | Status: AC
  Administered 2017-01-13: 30 mL via ORAL

## 2017-01-13 MED ORDER — OMEPRAZOLE 40 MG PO CPDR: 40.0000 mg | DELAYED_RELEASE_CAPSULE | Freq: Every day | ORAL | 3 refills | Status: DC

## 2017-01-13 NOTE — Progress Notes (Signed)
   Subjective:    Patient ID: Janet Pierce, female    DOB: April 14, 1951, 66 y.o.   MRN: [id number]  HPI abd pain- pt took Aleve for a headache on Friday night and then woke up Saturday w/ abdominal pain and gas, 'like a bad hunger pain'.  Pt is also using Nicorette- this AM had Nicorette, coffee, and no food and again developed pain, belching, and increased gas.  + bloating.  Pain improves w/ belching.  Some relief w/ GI cocktail.   Review of Systems For ROS see HPI     Objective:   Physical Exam  Constitutional: She is oriented to person, place, and time. She appears well-developed and well-nourished. No distress.  HENT:  Head: Normocephalic and atraumatic.  Cardiovascular: Normal rate, regular rhythm and normal heart sounds.   Pulmonary/Chest: Effort normal and breath sounds normal. No respiratory distress. She has no wheezes. She has no rales.  Abdominal: Soft. Bowel sounds are normal. She exhibits distension. There is no tenderness. There is no rebound and no guarding.  Neurological: She is alert and oriented to person, place, and time.  Skin: Skin is warm and dry.  Psychiatric: She has a normal mood and affect. Her behavior is normal. Thought content normal.  Vitals reviewed.         Assessment & Plan:

## 2017-01-13 NOTE — Assessment & Plan Note (Signed)
New.  Pt's abd pain is consistent w/ gastritis- starting w/ pill gastritis and then worsening based on lifestyle (nicotine, caffeine, and irregular eating).  Pain improved w/ GI cocktail.  Start daily PPI and carafate prn.  Reviewed lifestyle and dietary modifications as well as red flags and supportive care.

## 2017-01-13 NOTE — Telephone Encounter (Signed)
Pt is asking that Sulcrafate be called into [facility] and that she will buy Omeprazole OTC due to cost.

## 2017-01-13 NOTE — Telephone Encounter (Signed)
Spoke with pharmacy and they did not run any medications through insurance.   I am faxing a copy of her insurance card to them and they will run it again.    Patient is aware and states that she will call them about 1:00 to check the status/price.  If it is still too expensive, patient will call back for medications to be sent to [facility].

## 2017-01-13 NOTE — Telephone Encounter (Signed)
Which medication is that expensive?  Both of these are generic prescriptions (Omeprazole, Sulcrafate)

## 2017-01-13 NOTE — Patient Instructions (Signed)
Follow up as needed or as scheduled Start the Omeprazole once daily to decrease acid production Take the Carafate prior to meals and before bed to protect stomach Try and limit alcohol, caffeine, nicotine, and acidic or spicy foods to help with your symptoms Make sure you are eating regularly to avoid your stomach being empty Call with any questions or concerns Hang in there!!!

## 2017-01-13 NOTE — Telephone Encounter (Signed)
Thank you for checking- this makes much more sense

## 2017-01-13 NOTE — Progress Notes (Signed)
Pre visit review using our clinic review tool, if applicable. No additional management support is needed unless otherwise documented below in the visit note. 

## 2017-01-13 NOTE — Telephone Encounter (Signed)
Please advise, ok to send to mail order?

## 2017-01-13 NOTE — Telephone Encounter (Signed)
Patient states that the pharmacist did not say which medication was so high, just that both of them together were $175.  She states that her insurance uses [facility] so she thinks that is why it was so high.

## 2017-01-13 NOTE — Telephone Encounter (Signed)
Patient states she was seen this morning and is currently at pharmacy picking up medication.  She states medication will cost over $175 to have filled at local [facility].  She is requesting rx be sent to [facility] instead as it will be cheaper.

## 2017-01-13 NOTE — Telephone Encounter (Signed)
Ok to send 49 of each to mail order pharmacy but this will not help her immediate situation (since it will take awhile for mail-order to send meds).  Please call pharmacy and see what the issue is as we don't seem to be getting the whole story from pt

## 2017-01-14 MED ORDER — SUCRALFATE 1 G PO TABS: 1.0000 g | ORAL_TABLET | Freq: Three times a day (TID) | ORAL | 0 refills | Status: DC

## 2017-01-14 NOTE — Telephone Encounter (Signed)
Please just send #90 as I don't want her on this for 3 months.  Thanks!

## 2017-01-14 NOTE — Telephone Encounter (Signed)
Please advise, with this [facility] and needing #90 do I send in 270 since this should be a short term medication?

## 2017-01-14 NOTE — Telephone Encounter (Signed)
Called and left a detailed message per previous note to inform pt that med was filled to mail order as requested.

## 2017-01-14 NOTE — Telephone Encounter (Signed)
Patient requesting call back once medication has been sent to [facility] since it was not sent in yesterday.  Patient states she has to be at work at [employer] this morning.  If you call after 9am just leave message on voicemail as she will be unable to answer her phone.

## 2017-01-14 NOTE — Addendum Note (Signed)
Addended by: Davis Gourd on: 01/14/2017 10:08 AM   Modules accepted: Orders

## 2017-02-20 ENCOUNTER — Other Ambulatory Visit: Payer: Self-pay | Admitting: Family Medicine

## 2017-04-15 NOTE — Progress Notes (Addendum)
Subjective:   Janet Pierce is a 66 y.o. female who presents for an Initial Medicare Annual Wellness Visit.  Review of Systems    No ROS.  Medicare Wellness Visit. Additional risk factors are reflected in the social history.   Cardiac Risk Factors include: advanced age (>50men, >3 women);dyslipidemia;hypertension;family history of premature cardiovascular disease   Sleep patterns: Sleeps 8 hours.  Home Safety/Smoke Alarms: Feels safe in home. Smoke alarms in place.  Living environment; residence and Firearm Safety: Lives alone in 2 story home.  Seat Belt Safety/Bike Helmet: Wears seat belt.   Female:   [id number]       Mammo-01/21/2013, negative. Ordered today.      Dexa scan-01/23/2006, normal. Ordered today.  CCS-01/28/2008, pt reports normal. ([facility]).      Objective:    Today's Vitals   04/16/17 1122  BP: 132/70  Pulse: 95  SpO2: 98%  Weight: 147 lb (66.7 kg)  Height: 5\' 4"  (1.626 m)  PainSc: 1    Body mass index is 25.23 kg/m.  Advanced Directives 04/16/2017 07/15/2016 06/19/2015 07/15/2014 02/22/2013  Does Patient Have a Medical Advance Directive? No No No Yes Patient does not have advance directive;Patient would not like information  Type of Advance Directive - - - Living will -  Would patient like information on creating a medical advance directive? Yes (MAU/Ambulatory/Procedural Areas - Information given) - No - patient declined information - -    Current Medications (verified) Outpatient Encounter Medications as of 04/16/2017  Medication Sig  . acetaminophen (TYLENOL) 500 MG tablet Take 500 mg by mouth every 6 (six) hours as needed for mild pain or moderate pain.  Marland Kitchen amLODipine (NORVASC) 10 MG tablet TAKE 1 TABLET DAILY BEFORE BREAKFAST  . atorvastatin (LIPITOR) 20 MG tablet TK 1 T PO QD (Patient taking differently: TK 1 T PO QD)  . bimatoprost (LUMIGAN) 0.01 % SOLN Place 1 drop into both eyes at bedtime.  . meloxicam (MOBIC) 15 MG tablet TAKE 1 TABLET(15  MG) BY MOUTH DAILY  . SIMBRINZA 1-0.2 % SUSP Place 1 drop into the right eye 2 (two) times daily.   . timolol (TIMOPTIC) 0.5 % ophthalmic solution Place 1 drop into the right eye 2 (two) times daily.   . Vitamin D, Ergocalciferol, (DRISDOL) 50000 units CAPS capsule Take 1 capsule (50,000 Units total) by mouth every 7 (seven) days.  Marland Kitchen omeprazole (PRILOSEC) 40 MG capsule Take 1 capsule (40 mg total) by mouth daily. (Patient not taking: Reported on 04/16/2017)  . sucralfate (CARAFATE) 1 g tablet Take 1 tablet (1 g total) by mouth 4 (four) times daily -  with meals and at bedtime. (Patient not taking: Reported on 04/16/2017)   No facility-administered encounter medications on file as of 04/16/2017.     Allergies (verified) Patient has no known allergies.   History: Past Medical History:  Diagnosis Date  . Arthritis   . Glaucoma   . Hyperlipidemia   . Hypertension   . Retinal detachment    left eye prosthesis  . Scoliosis    Past Surgical History:  Procedure Laterality Date  . EYE SURGERY Right    glucoma  . LIPOMA EXCISION    . RETINAL DETACHMENT SURGERY    . TONSILLECTOMY     Family History  Problem Relation Age of Onset  . Lung cancer Mother   . Hypertension Mother   . Bone cancer Mother   . Diabetes Maternal Grandmother   . Colon cancer Neg Hx  Social History   Socioeconomic History  . Marital status: Legally Separated    Spouse name: None  . Number of children: 2  . Years of education: None  . Highest education level: None  Social Needs  . Financial resource strain: None  . Food insecurity - worry: None  . Food insecurity - inability: None  . Transportation needs - medical: None  . Transportation needs - non-medical: None  Occupational History  . Occupation: Chemical engineer    Comment: [employer]  Tobacco Use  . Smoking status: Former Smoker    Packs/day: 0.25    Types: Cigarettes    Last attempt to quit: 11/23/2016    Years since quitting: 0.3  .  Smokeless tobacco: Never Used  . Tobacco comment: stopped by using nicorette gum. has completely stopped  Substance and Sexual Activity  . Alcohol use: Yes    Alcohol/week: 0.0 oz    Comment: occasionally  . Drug use: No  . Sexual activity: None  Other Topics Concern  . None  Social History Narrative  . None    Tobacco Counseling Counseling given: Not Answered Comment: stopped by using nicorette gum. has completely stopped    Activities of Daily Living In your present state of health, do you have any difficulty performing the following activities: 04/16/2017 12/05/2016  Hearing? N N  Vision? N N  Difficulty concentrating or making decisions? N N  Walking or climbing stairs? N N  Dressing or bathing? N N  Doing errands, shopping? N N  Preparing Food and eating ? N -  Using the Toilet? N -  In the past six months, have you accidently leaked urine? N -  Do you have problems with loss of bowel control? N -  Managing your Medications? N -  Managing your Finances? N -  Housekeeping or managing your Housekeeping? N -  Some recent data might be hidden     Immunizations and Health Maintenance  There is no immunization history on file for this patient. Health Maintenance Due  Topic Date Due  . MAMMOGRAM  01/22/2015  . DEXA SCAN  03/23/2016    Patient Care Team: Midge Minium, MD as PCP - [place], [place] (Hand Surgery)  Indicate any recent Medical Services you may have received from other than [facility] providers in the past year (date may be approximate).     Assessment:   This is a routine wellness examination for Janet Pierce.  Hearing/Vision screen Hearing Screening Comments: Able to hear conversational tones w/o difficulty. No issues reported.   Vision Screening Comments: Last exam 09/2016, yearly. Dr. Leana Roe. Wears glasses, glaucoma and retina detachment.   Dietary issues and exercise activities discussed: Current Exercise Habits: The  patient does not participate in regular exercise at present, Exercise limited by: None identified   Diet (meal preparation, eat out, water intake, caffeinated beverages, dairy products, fruits and vegetables): Drinks coffee, hot tea, and water.   Eats fruits/vegetables daily. States there is room for improvement.     Goals    . Increase physical activity     Increase activity and decrease sugar intake.       Depression Screen PHQ 2/9 Scores 04/16/2017 12/05/2016 05/27/2016 10/26/2015 05/30/2015  PHQ - 2 Score 0 0 0 0 0  PHQ- 9 Score - 0 0 - -  Exception Documentation - - - - Patient refusal    Fall Risk Fall Risk  04/16/2017 12/05/2016 10/26/2015 05/30/2015  Falls in the past year?  No No No No     Cognitive Function:       Ad8 score reviewed for issues:  Issues making decisions: no  Less interest in hobbies / activities: no  Repeats questions, stories (family complaining): no  Trouble using ordinary gadgets (microwave, computer, phone): no  Forgets the month or year: no  Mismanaging finances: no  Remembering appts: no  Daily problems with thinking and/or memory: no Ad8 score is=0     Screening Tests Health Maintenance  Topic Date Due  . MAMMOGRAM  01/22/2015  . DEXA SCAN  03/23/2016  . INFLUENZA VACCINE  07/19/2017 (Originally 11/19/2016)  . TETANUS/TDAP  04/16/2018 (Originally 03/23/1970)  . Hepatitis C Screening  04/16/2018 (Originally 12-Aug-1950)  . PNA vac Low Risk Adult (1 of 2 - PCV13) 04/16/2018 (Originally 03/23/2016)  . COLONOSCOPY  01/27/2018      Plan:     Schedule mammogram and bone scan.   Call insurance about Silver Sneakers  Bring a copy of your living will and/or healthcare power of attorney to your next office visit.  Continue doing brain stimulating activities (puzzles, reading, adult coloring books, staying active) to keep memory sharp.    I have personally reviewed and noted the following in the patient's chart:   . Medical and social  history . Use of alcohol, tobacco or illicit drugs  . Current medications and supplements . Functional ability and status . Nutritional status . Physical activity . Advanced directives . List of other physicians . Hospitalizations, surgeries, and ER visits in previous 12 months . Vitals . Screenings to include cognitive, depression, and falls . Referrals and appointments  In addition, I have reviewed and discussed with patient certain preventive protocols, quality metrics, and best practice recommendations. A written personalized care plan for preventive services as well as general preventive health recommendations were provided to patient.     Gerilyn Nestle, RN   04/16/2017   PCP Notes: -Declines PCV13 and Flu -Mammo and Dexa ordered -Pt complains of left sided "stiffness", states she thinks it's arthritis. Plans to begin yoga/stretching -Pt would like to have cyst on right forearm removed. Was evaluated by [facility], plans to schedule appointment. Juluis Rainier) -Pt has not been taking Lipitor (takes approx 3 tabs/month). Encouraged to take daily.  -F/U with PCP 05/21/2017 (advised to fast)  Reviewed documentation and agree w/ RN's notes.  Will handle outstanding issues at CPE.  [name], MD

## 2017-04-16 ENCOUNTER — Other Ambulatory Visit: Payer: Self-pay

## 2017-04-16 ENCOUNTER — Ambulatory Visit: Payer: Medicare Other

## 2017-04-16 VITALS — BP 132/70 | HR 95 | Ht 64.0 in | Wt 147.0 lb

## 2017-04-16 DIAGNOSIS — Z0000 Encounter for general adult medical examination without abnormal findings: Secondary | ICD-10-CM

## 2017-04-16 DIAGNOSIS — E2839 Other primary ovarian failure: Secondary | ICD-10-CM | POA: Diagnosis not present

## 2017-04-16 DIAGNOSIS — Z1231 Encounter for screening mammogram for malignant neoplasm of breast: Secondary | ICD-10-CM | POA: Diagnosis not present

## 2017-04-16 DIAGNOSIS — Z1239 Encounter for other screening for malignant neoplasm of breast: Secondary | ICD-10-CM

## 2017-04-16 NOTE — Patient Instructions (Addendum)
Schedule mammogram and bone scan.   Call insurance about Silver Sneakers  Bring a copy of your living will and/or healthcare power of attorney to your next office visit.  Continue doing brain stimulating activities (puzzles, reading, adult coloring books, staying active) to keep memory sharp.   Health Maintenance, Female Adopting a healthy lifestyle and getting preventive care can go a long way to promote health and wellness. Talk with your health care provider about what schedule of regular examinations is right for you. This is a good chance for you to check in with your provider about disease prevention and staying healthy. In between checkups, there are plenty of things you can do on your own. Experts have done a lot of research about which lifestyle changes and preventive measures are most likely to keep you healthy. Ask your health care provider for more information. Weight and diet Eat a healthy diet  Be sure to include plenty of vegetables, fruits, low-fat dairy products, and lean protein.  Do not eat a lot of foods high in solid fats, added sugars, or salt.  Get regular exercise. This is one of the most important things you can do for your health. ? Most adults should exercise for at least 150 minutes each week. The exercise should increase your heart rate and make you sweat (moderate-intensity exercise). ? Most adults should also do strengthening exercises at least twice a week. This is in addition to the moderate-intensity exercise.  Maintain a healthy weight  Body mass index (BMI) is a measurement that can be used to identify possible weight problems. It estimates body fat based on height and weight. Your health care provider can help determine your BMI and help you achieve or maintain a healthy weight.  For females 25 years of age and older: ? A BMI below 18.5 is considered underweight. ? A BMI of 18.5 to 24.9 is normal. ? A BMI of 25 to 29.9 is considered overweight. ? A  BMI of 30 and above is considered obese.  Watch levels of cholesterol and blood lipids  You should start having your blood tested for lipids and cholesterol at 66 years of age, then have this test every 5 years.  You may need to have your cholesterol levels checked more often if: ? Your lipid or cholesterol levels are high. ? You are older than 66 years of age. ? You are at high risk for heart disease.  Cancer screening Lung Cancer  Lung cancer screening is recommended for adults 85-63 years old who are at high risk for lung cancer because of a history of smoking.  A yearly low-dose CT scan of the lungs is recommended for people who: ? Currently smoke. ? Have quit within the past 15 years. ? Have at least a 30-pack-year history of smoking. A pack year is smoking an average of one pack of cigarettes a day for 1 year.  Yearly screening should continue until it has been 15 years since you quit.  Yearly screening should stop if you develop a health problem that would prevent you from having lung cancer treatment.  Breast Cancer  Practice breast self-awareness. This means understanding how your breasts normally appear and feel.  It also means doing regular breast self-exams. Let your health care provider know about any changes, no matter how small.  If you are in your 20s or 30s, you should have a clinical breast exam (CBE) by a health care provider every 1-3 years as part of a regular  health exam.  If you are 40 or older, have a CBE every year. Also consider having a breast X-ray (mammogram) every year.  If you have a family history of breast cancer, talk to your health care provider about genetic screening.  If you are at high risk for breast cancer, talk to your health care provider about having an MRI and a mammogram every year.  Breast cancer gene (BRCA) assessment is recommended for women who have family members with BRCA-related cancers. BRCA-related cancers  include: ? Breast. ? Ovarian. ? Tubal. ? Peritoneal cancers.  Results of the assessment will determine the need for genetic counseling and BRCA1 and BRCA2 testing.  Cervical Cancer Your health care provider may recommend that you be screened regularly for cancer of the pelvic organs (ovaries, uterus, and vagina). This screening involves a pelvic examination, including checking for microscopic changes to the surface of your cervix (Pap test). You may be encouraged to have this screening done every 3 years, beginning at age 21.  For women ages 30-65, health care providers may recommend pelvic exams and Pap testing every 3 years, or they may recommend the Pap and pelvic exam, combined with testing for human papilloma virus (HPV), every 5 years. Some types of HPV increase your risk of cervical cancer. Testing for HPV may also be done on women of any age with unclear Pap test results.  Other health care providers may not recommend any screening for nonpregnant women who are considered low risk for pelvic cancer and who do not have symptoms. Ask your health care provider if a screening pelvic exam is right for you.  If you have had past treatment for cervical cancer or a condition that could lead to cancer, you need Pap tests and screening for cancer for at least 20 years after your treatment. If Pap tests have been discontinued, your risk factors (such as having a new sexual partner) need to be reassessed to determine if screening should resume. Some women have medical problems that increase the chance of getting cervical cancer. In these cases, your health care provider may recommend more frequent screening and Pap tests.  Colorectal Cancer  This type of cancer can be detected and often prevented.  Routine colorectal cancer screening usually begins at 66 years of age and continues through 66 years of age.  Your health care provider may recommend screening at an earlier age if you have risk factors  for colon cancer.  Your health care provider may also recommend using home test kits to check for hidden blood in the stool.  A small camera at the end of a tube can be used to examine your colon directly (sigmoidoscopy or colonoscopy). This is done to check for the earliest forms of colorectal cancer.  Routine screening usually begins at age 50.  Direct examination of the colon should be repeated every 5-10 years through 66 years of age. However, you may need to be screened more often if early forms of precancerous polyps or small growths are found.  Skin Cancer  Check your skin from head to toe regularly.  Tell your health care provider about any new moles or changes in moles, especially if there is a change in a mole's shape or color.  Also tell your health care provider if you have a mole that is larger than the size of a pencil eraser.  Always use sunscreen. Apply sunscreen liberally and repeatedly throughout the day.  Protect yourself by wearing long sleeves, pants, a   wide-brimmed hat, and sunglasses whenever you are outside.  Heart disease, diabetes, and high blood pressure  High blood pressure causes heart disease and increases the risk of stroke. High blood pressure is more likely to develop in: ? People who have blood pressure in the high end of the normal range (130-139/85-89 mm Hg). ? People who are overweight or obese. ? People who are African American.  If you are 54-30 years of age, have your blood pressure checked every 3-5 years. If you are 3 years of age or older, have your blood pressure checked every year. You should have your blood pressure measured twice-once when you are at a hospital or clinic, and once when you are not at a hospital or clinic. Record the average of the two measurements. To check your blood pressure when you are not at a hospital or clinic, you can use: ? An automated blood pressure machine at a pharmacy. ? A home blood pressure monitor.  If  you are between 55 years and 21 years old, ask your health care provider if you should take aspirin to prevent strokes.  Have regular diabetes screenings. This involves taking a blood sample to check your fasting blood sugar level. ? If you are at a normal weight and have a low risk for diabetes, have this test once every three years after 66 years of age. ? If you are overweight and have a high risk for diabetes, consider being tested at a younger age or more often. Preventing infection Hepatitis B  If you have a higher risk for hepatitis B, you should be screened for this virus. You are considered at high risk for hepatitis B if: ? You were born in a country where hepatitis B is common. Ask your health care provider which countries are considered high risk. ? Your parents were born in a high-risk country, and you have not been immunized against hepatitis B (hepatitis B vaccine). ? You have HIV or AIDS. ? You use needles to inject street drugs. ? You live with someone who has hepatitis B. ? You have had sex with someone who has hepatitis B. ? You get hemodialysis treatment. ? You take certain medicines for conditions, including cancer, organ transplantation, and autoimmune conditions.  Hepatitis C  Blood testing is recommended for: ? Everyone born from [birth year] through 1965. ? Anyone with known risk factors for hepatitis C.  Sexually transmitted infections (STIs)  You should be screened for sexually transmitted infections (STIs) including gonorrhea and chlamydia if: ? You are sexually active and are younger than 66 years of age. ? You are older than 66 years of age and your health care provider tells you that you are at risk for this type of infection. ? Your sexual activity has changed since you were last screened and you are at an increased risk for chlamydia or gonorrhea. Ask your health care provider if you are at risk.  If you do not have HIV, but are at risk, it may be recommended  that you take a prescription medicine daily to prevent HIV infection. This is called pre-exposure prophylaxis (PrEP). You are considered at risk if: ? You are sexually active and do not regularly use condoms or know the HIV status of your partner(s). ? You take drugs by injection. ? You are sexually active with a partner who has HIV.  Talk with your health care provider about whether you are at high risk of being infected with HIV. If you choose to  begin PrEP, you should first be tested for HIV. You should then be tested every 3 months for as long as you are taking PrEP. Pregnancy  If you are premenopausal and you may become pregnant, ask your health care provider about preconception counseling.  If you may become pregnant, take 400 to 800 micrograms (mcg) of folic acid every day.  If you want to prevent pregnancy, talk to your health care provider about birth control (contraception). Osteoporosis and menopause  Osteoporosis is a disease in which the bones lose minerals and strength with aging. This can result in serious bone fractures. Your risk for osteoporosis can be identified using a bone density scan.  If you are 65 years of age or older, or if you are at risk for osteoporosis and fractures, ask your health care provider if you should be screened.  Ask your health care provider whether you should take a calcium or vitamin D supplement to lower your risk for osteoporosis.  Menopause may have certain physical symptoms and risks.  Hormone replacement therapy may reduce some of these symptoms and risks. Talk to your health care provider about whether hormone replacement therapy is right for you. Follow these instructions at home:  Schedule regular health, dental, and eye exams.  Stay current with your immunizations.  Do not use any tobacco products including cigarettes, chewing tobacco, or electronic cigarettes.  If you are pregnant, do not drink alcohol.  If you are  breastfeeding, limit how much and how often you drink alcohol.  Limit alcohol intake to no more than 1 drink per day for nonpregnant women. One drink equals 12 ounces of beer, 5 ounces of wine, or 1 ounces of hard liquor.  Do not use street drugs.  Do not share needles.  Ask your health care provider for help if you need support or information about quitting drugs.  Tell your health care provider if you often feel depressed.  Tell your health care provider if you have ever been abused or do not feel safe at home. This information is not intended to replace advice given to you by your health care provider. Make sure you discuss any questions you have with your health care provider. Document Released: 10/21/2010 Document Revised: 09/13/2015 Document Reviewed: 01/09/2015 Elsevier Interactive Patient Education  2018 Elsevier Inc.  

## 2017-04-22 ENCOUNTER — Other Ambulatory Visit: Payer: Self-pay | Admitting: Family Medicine

## 2017-05-21 ENCOUNTER — Ambulatory Visit: Payer: Medicare Other | Admitting: Family Medicine

## 2017-05-21 ENCOUNTER — Encounter: Payer: Self-pay | Admitting: Family Medicine

## 2017-05-21 ENCOUNTER — Other Ambulatory Visit: Payer: Self-pay

## 2017-05-21 VITALS — BP 128/72 | HR 77 | Temp 98.0°F | Resp 16 | Ht 64.0 in | Wt 146.1 lb

## 2017-05-21 DIAGNOSIS — E785 Hyperlipidemia, unspecified: Secondary | ICD-10-CM | POA: Diagnosis not present

## 2017-05-21 DIAGNOSIS — I10 Essential (primary) hypertension: Secondary | ICD-10-CM

## 2017-05-21 LAB — HEPATIC FUNCTION PANEL
ALT: 12 U/L (ref 0–35)
AST: 14 U/L (ref 0–37)
Albumin: 4.2 g/dL (ref 3.5–5.2)
Alkaline Phosphatase: 58 U/L (ref 39–117)
BILIRUBIN TOTAL: 0.7 mg/dL (ref 0.2–1.2)
Bilirubin, Direct: 0.1 mg/dL (ref 0.0–0.3)
TOTAL PROTEIN: 7.2 g/dL (ref 6.0–8.3)

## 2017-05-21 LAB — LIPID PANEL
CHOL/HDL RATIO: 5
Cholesterol: 228 mg/dL — ABNORMAL HIGH (ref 0–200)
HDL: 48.3 mg/dL (ref >39.00)
LDL Cholesterol: 166 mg/dL — ABNORMAL HIGH (ref 0–99)
NonHDL: 180.04
TRIGLYCERIDES: 72 mg/dL (ref 0.0–149.0)
VLDL: 14.4 mg/dL (ref 0.0–40.0)

## 2017-05-21 LAB — CBC WITH DIFFERENTIAL/PLATELET
Basophils Absolute: 0 10*3/uL (ref 0.0–0.1)
Basophils Relative: 1.2 % (ref 0.0–3.0)
EOS ABS: 0.1 10*3/uL (ref 0.0–0.7)
Eosinophils Relative: 2.8 % (ref 0.0–5.0)
HCT: 36.4 % (ref 36.0–46.0)
HEMOGLOBIN: 12 g/dL (ref 12.0–15.0)
Lymphocytes Relative: 40.3 % (ref 12.0–46.0)
Lymphs Abs: 1.5 10*3/uL (ref 0.7–4.0)
MCHC: 33.1 g/dL (ref 30.0–36.0)
MCV: 83.4 fl (ref 78.0–100.0)
MONO ABS: 0.3 10*3/uL (ref 0.1–1.0)
Monocytes Relative: 9.2 % (ref 3.0–12.0)
Neutro Abs: 1.7 10*3/uL (ref 1.4–7.7)
Neutrophils Relative %: 46.5 % (ref 43.0–77.0)
Platelets: 302 10*3/uL (ref 150.0–400.0)
RBC: 4.36 Mil/uL (ref 3.87–5.11)
RDW: 13.3 % (ref 11.5–15.5)
WBC: 3.7 10*3/uL — AB (ref 4.0–10.5)

## 2017-05-21 LAB — BASIC METABOLIC PANEL
BUN: 11 mg/dL (ref 6–23)
CO2: 34 meq/L — AB (ref 19–32)
CREATININE: 0.69 mg/dL (ref 0.40–1.20)
Calcium: 9.3 mg/dL (ref 8.4–10.5)
Chloride: 102 mEq/L (ref 96–112)
GFR: 109.42 mL/min (ref >60.00)
Glucose, Bld: 99 mg/dL (ref 70–99)
Potassium: 4.2 mEq/L (ref 3.5–5.1)
Sodium: 140 mEq/L (ref 135–145)

## 2017-05-21 LAB — TSH: TSH: 1.74 u[IU]/mL (ref 0.35–4.50)

## 2017-05-21 NOTE — Patient Instructions (Addendum)
Schedule your complete physical in 6 months We'll notify you of your lab results and make any changes if needed Call and schedule your mammogram at the [facility] [phone] Continue to work on healthy diet and regular exercise- you look great! Call with any questions or concerns Happy New Year!!

## 2017-05-21 NOTE — Progress Notes (Signed)
   Subjective:    Patient ID: Rehema Muffley, female    DOB: [birth year], 67 y.o.   MRN: [id number]  HPI HTN- chronic problem, on Amlodipine 10mg  daily w/ adequate control.  No CP, SOB, HAs, visual changes, edema.  Hyperlipidemia- chronic problem, on Lipitor 20mg  daily but pt reports she is only taking 'a couple times a month'.  Denies abd pain, N/V, myalgias.  Pt plans to start Silver Sneakers   Review of Systems For ROS see HPI     Objective:   Physical Exam  Constitutional: She is oriented to person, place, and time. She appears well-developed and well-nourished. No distress.  HENT:  Head: Normocephalic and atraumatic.  Eyes: Conjunctivae and EOM are normal. Pupils are equal, round, and reactive to light.  Neck: Normal range of motion. Neck supple. No thyromegaly present.  Cardiovascular: Normal rate, regular rhythm, normal heart sounds and intact distal pulses.  No murmur heard. Pulmonary/Chest: Effort normal and breath sounds normal. No respiratory distress.  Abdominal: Soft. She exhibits no distension. There is no tenderness.  Musculoskeletal: She exhibits no edema.  Lymphadenopathy:    She has no cervical adenopathy.  Neurological: She is alert and oriented to person, place, and time.  Skin: Skin is warm and dry.  Psychiatric: She has a normal mood and affect. Her behavior is normal.  Vitals reviewed.         Assessment & Plan:

## 2017-05-21 NOTE — Assessment & Plan Note (Signed)
Chronic problem.  Adequate control today.  Asymptomatic.  Check labs.  No anticipated med changes.  Will follow. 

## 2017-05-21 NOTE — Assessment & Plan Note (Signed)
Chronic problem.  She is supposed to be taking Lipitor daily but she is only taking 'a few times a month'.  Check labs.  Adjust meds prn

## 2017-06-09 ENCOUNTER — Telehealth: Payer: Self-pay | Admitting: Family Medicine

## 2017-06-09 NOTE — Telephone Encounter (Signed)
Pt dropped off medical evaluation form to be completed. Placed in bin up front with charge sheet.

## 2017-06-10 NOTE — Telephone Encounter (Signed)
Form completed and placed in basket  

## 2017-06-18 ENCOUNTER — Other Ambulatory Visit: Payer: Self-pay | Admitting: Family Medicine

## 2017-06-25 ENCOUNTER — Ambulatory Visit: Payer: Self-pay | Admitting: *Deleted

## 2017-06-25 NOTE — Telephone Encounter (Signed)
Patient called, left VM to return call to discuss Melatonin.

## 2017-06-25 NOTE — Telephone Encounter (Signed)
Pt calling asking if it was ok to take melatonin along with prescribed medications.  Advised pt that it would be ok to take Melatonin with prescribed medications and to follow instructions on how the medication should be taken as it is written on the medication bottle. Pt verbalized understanding.   Reason for Disposition . Caller has medication question only, adult not sick, and triager answers question  Answer Assessment - Initial Assessment Questions 1. SYMPTOMS: "Do you have any symptoms?"     No 2. SEVERITY: If symptoms are present, ask "Are they mild, moderate or severe?"     N/a  Protocols used: MEDICATION QUESTION CALL-A-AH

## 2017-06-27 ENCOUNTER — Other Ambulatory Visit: Payer: Self-pay | Admitting: Family Medicine

## 2017-06-30 ENCOUNTER — Ambulatory Visit
Admission: RE | Admit: 2017-06-30 | Discharge: 2017-06-30 | Disposition: A | Discharge status: Home / Self Care | Payer: Medicare Other | Source: Ambulatory Visit | Attending: Family Medicine | Admitting: Family Medicine

## 2017-06-30 DIAGNOSIS — Z1239 Encounter for other screening for malignant neoplasm of breast: Secondary | ICD-10-CM

## 2017-06-30 DIAGNOSIS — E2839 Other primary ovarian failure: Secondary | ICD-10-CM

## 2017-07-01 ENCOUNTER — Encounter: Payer: Self-pay | Admitting: General Practice

## 2017-07-21 ENCOUNTER — Other Ambulatory Visit: Payer: Self-pay | Admitting: Family Medicine

## 2017-07-21 DIAGNOSIS — K219 Gastro-esophageal reflux disease without esophagitis: Secondary | ICD-10-CM

## 2017-07-21 NOTE — Telephone Encounter (Signed)
Last OV 05/21/2017, Next OV on 04/20/2018 with Maudie Mercury  I only see Omeprazole/Prilosec 40 mg on her current medication list.   Please advise if okay to fill. Thank you!

## 2017-07-22 NOTE — Telephone Encounter (Signed)
We have Omeprazole on her current med list (but it appears she has been out for some time).  Does she want the Protonix in place of the Omeprazole?  I am happy to fill either one, I just need to know which she prefers

## 2017-07-22 NOTE — Telephone Encounter (Signed)
Called patient and left a voice message to see if she wants the Omeprazole or the Pantoprazole and to please call us back to let us know which one she prefers.

## 2017-07-23 NOTE — Telephone Encounter (Signed)
Called pt and left another message on voicemail to find out which medication she prefers.

## 2017-07-24 NOTE — Telephone Encounter (Signed)
Called pt again and could not leave a message. Closing encounter until pt returns call.

## 2017-08-16 ENCOUNTER — Other Ambulatory Visit: Payer: Self-pay | Admitting: Family Medicine

## 2017-08-19 ENCOUNTER — Ambulatory Visit: Payer: Medicare Other | Admitting: Family Medicine

## 2017-08-19 ENCOUNTER — Encounter: Payer: Self-pay | Admitting: Family Medicine

## 2017-08-19 ENCOUNTER — Other Ambulatory Visit: Payer: Self-pay

## 2017-08-19 VITALS — BP 112/80 | HR 99 | Temp 99.6°F | Resp 16 | Ht 64.0 in | Wt 143.1 lb

## 2017-08-19 DIAGNOSIS — L819 Disorder of pigmentation, unspecified: Secondary | ICD-10-CM | POA: Diagnosis not present

## 2017-08-19 NOTE — Progress Notes (Signed)
   Subjective:    Patient ID: Janet Pierce, female    DOB: 1950/05/27, 67 y.o.   MRN: [id number]  HPI Facial swelling- pt reports daughter first noticed on Saturday, told the pt is was 'darker'.  She was in [place] last week and returned on Friday.  No swimming or exotic foods.  Pt feels it is spreading from her R chin around to upper lip.  No pain, doesn't feel swollen or tight.  Pt had a lot of tea w/ lemon.  Pt feels skin is not as dark as it was over the weekend   Review of Systems For ROS see HPI     Objective:   Physical Exam  Constitutional: She appears well-developed and well-nourished. No distress.  HENT:  Head: Normocephalic and atraumatic.  Neck: Neck supple.  Lymphadenopathy:    She has no cervical adenopathy.  Skin: Skin is warm and dry. No rash noted. No erythema.  Mild hyperpigmentation of R side of chin  Psychiatric: She has a normal mood and affect. Her behavior is normal. Thought content normal.  Vitals reviewed.         Assessment & Plan:  Hyperpigmentation- new.  Pt's skin discoloration is very mild after a trip to [place].  No rash, no swelling present.  No need for treatment at this time.  Pt is very concerned about the appearance of this area but neither I, nor Elyn [facility] can clearly see what she is describing.  Pt instructed if sxs do not improve to let me know so we can refer to derm.  Pt expressed understanding and is in agreement w/ plan.

## 2017-08-19 NOTE — Patient Instructions (Signed)
Follow up as needed or as scheduled I would not apply anything to the skin Try avoid direct or prolonged sunlight as this will darken the area Call with any questions or concerns If no improvement in 10-14 days, please let me know so we can get you to derm Hang in there!!!

## 2017-10-01 ENCOUNTER — Telehealth: Payer: Self-pay | Admitting: Family Medicine

## 2017-10-01 NOTE — Telephone Encounter (Unsigned)
Copied from [place] [id number]. Topic: Quick Communication - Rx Refill/Question >> Oct 01, 2017  5:36 PM Neva Seat wrote: Pantoprazole sod dr tabs  40 mg  Requesting refill   [facility] [place], [address] [place] [address] Phone: [phone] Fax: [phone]

## 2017-10-02 ENCOUNTER — Telehealth: Payer: Self-pay | Admitting: Family Medicine

## 2017-10-02 ENCOUNTER — Other Ambulatory Visit: Payer: Self-pay | Admitting: General Practice

## 2017-10-02 DIAGNOSIS — K219 Gastro-esophageal reflux disease without esophagitis: Secondary | ICD-10-CM

## 2017-10-02 MED ORDER — PANTOPRAZOLE SODIUM 40 MG PO TBEC: 40.0000 mg | DELAYED_RELEASE_TABLET | Freq: Two times a day (BID) | ORAL | 4 refills | Status: DC

## 2017-10-02 NOTE — Telephone Encounter (Signed)
Pt changes back and forth from her omeprazole to protonix. I went ahead and filled the protonix as PCP has allowed this in the past.

## 2017-10-02 NOTE — Telephone Encounter (Unsigned)
Copied from [place] [id number]. Topic: Quick Communication - Rx Refill/Question >> Oct 01, 2017  5:36 PM Neva Seat wrote: Pantoprazole sod dr tabs  40 mg  Requesting refill   [facility] [place], [address] [place] [address] Phone: [phone] Fax: [phone]

## 2017-10-02 NOTE — Telephone Encounter (Signed)
Attempted to call patient- no VM- Protonix not on current list

## 2017-10-02 NOTE — Telephone Encounter (Signed)
Medication filled to pharmacy as requested.   

## 2017-10-05 ENCOUNTER — Telehealth: Payer: Self-pay | Admitting: Family Medicine

## 2017-10-05 DIAGNOSIS — K219 Gastro-esophageal reflux disease without esophagitis: Secondary | ICD-10-CM

## 2017-10-05 NOTE — Telephone Encounter (Signed)
Copied from [place] [id number]. Topic: Quick Communication - Rx Refill/Question >> Oct 05, 2017  4:05 PM Carolyn Stare wrote: Medication  pantoprazole (PROTONIX) 40 MG tablet      RX was sent to [facility] will not cover for pick up at [facility]: Please be advised that RX refills may take up to 3 business days. We ask that you follow-up with your pharmacy.

## 2017-10-06 MED ORDER — PANTOPRAZOLE SODIUM 40 MG PO TBEC: 40.0000 mg | DELAYED_RELEASE_TABLET | Freq: Two times a day (BID) | ORAL | 4 refills | Status: DC

## 2017-10-06 NOTE — Telephone Encounter (Signed)
Prescription sent to [facility] as requested by pt due to insurance not covering refill sent to [facility] on 10/02/17

## 2017-12-24 ENCOUNTER — Other Ambulatory Visit: Payer: Self-pay

## 2017-12-24 ENCOUNTER — Telehealth: Payer: Self-pay | Admitting: Family Medicine

## 2017-12-24 ENCOUNTER — Encounter: Payer: Self-pay | Admitting: Family Medicine

## 2017-12-24 ENCOUNTER — Ambulatory Visit: Payer: Medicare Other | Admitting: Family Medicine

## 2017-12-24 VITALS — BP 104/74 | HR 93 | Temp 98.4°F | Resp 16 | Ht 64.0 in | Wt 144.4 lb

## 2017-12-24 DIAGNOSIS — K219 Gastro-esophageal reflux disease without esophagitis: Secondary | ICD-10-CM

## 2017-12-24 DIAGNOSIS — M62838 Other muscle spasm: Secondary | ICD-10-CM

## 2017-12-24 MED ORDER — TIZANIDINE HCL 4 MG PO TABS: 4.0000 mg | ORAL_TABLET | Freq: Three times a day (TID) | ORAL | 0 refills | Status: DC | PRN

## 2017-12-24 MED ORDER — GI COCKTAIL ~~LOC~~
30.0000 mL | Freq: Once | ORAL | Status: AC
  Administered 2017-12-24: 30 mL via ORAL

## 2017-12-24 NOTE — Telephone Encounter (Signed)
We can send it to mail order but we're still only sending 45

## 2017-12-24 NOTE — Telephone Encounter (Signed)
Patient is requesting new Rx sent to [facility]- she states it is cheaper. She states she is getting 7 day supply and she wants the rest sent to mail order.

## 2017-12-24 NOTE — Telephone Encounter (Signed)
Per PCP #[id number] was filled to mail order.

## 2017-12-24 NOTE — Telephone Encounter (Signed)
Please advise, pt was made aware when she left the office that this was a short term medication only

## 2017-12-24 NOTE — Patient Instructions (Signed)
Follow up as needed or as scheduled RESTART the pantoprazole twice daily to improve your reflux and indigestion Use the Tizanidine nightly for neck/shoulder pain Try and switch your pillow and see if the tightness improves Continue to use heat to improve the spasms Call with any questions or concerns Hang in there!!!

## 2017-12-24 NOTE — Telephone Encounter (Signed)
Copied from [place]. Topic: Quick Communication - Rx Refill/Question >> Dec 24, 2017  2:36 PM Blase Mess A wrote: Medication: tiZANidine (ZANAFLEX) 4 MG tablet [[id number]]  Patient is going to pick up a 7 day suply of this medication from [facility].  But would like the regular script to be sent to [facility] because it is much cheaper.  Has the patient contacted their pharmacy? Yes  (Agent: If no, request that the patient contact the pharmacy for the refill.) (Agent: If yes, when and what did the pharmacy advise?)  Preferred Pharmacy (with phone number or street name): [facility], [place] [address] [place] [address] Phone: [phone] Fax: [phone]    Agent: Please be advised that RX refills may take up to 3 business days. We ask that you follow-up with your pharmacy.

## 2017-12-24 NOTE — Progress Notes (Signed)
   Subjective:    Patient ID: Janet Pierce, female    DOB: May 08, 1950, 67 y.o.   MRN: [id number]  HPI Shoulder pain- pt reports stiffness in both shoulders, L>R.  Improves w/ [place] and warm towels.  sxs are worse in the AM and at night.  + increased stress.  sxs have been ongoing for months- improved while on vacation to [place].  Indigestion- pt reports she is having chest tightness and worsening indigestion.  Not taking pantoprazole.  Pt has been taking Aleve for the last few days for her pain.  Chest tightness improves w/ belching.    Review of Systems For ROS see HPI     Objective:   Physical Exam  Constitutional: She is oriented to person, place, and time. She appears well-developed and well-nourished. No distress.  HENT:  Head: Normocephalic and atraumatic.  Neck: Normal range of motion. Neck supple.  Bilateral trap spasm, L>R  Cardiovascular: Normal rate, regular rhythm, normal heart sounds and intact distal pulses.  Pulmonary/Chest: Effort normal and breath sounds normal. No respiratory distress.  Musculoskeletal: She exhibits tenderness (TTP over trap spasms bilaterally).  Full ROM of shoulders bilaterally  Neurological: She is alert and oriented to person, place, and time. She displays normal reflexes. No cranial nerve deficit. She exhibits normal muscle tone. Coordination normal.  Skin: Skin is warm and dry.  Vitals reviewed.         Assessment & Plan:  Trap spasm- bilateral, L>R.  Start Tizanidine prn.  Encouraged her to switch pillow and work on Child psychotherapist as she didn't have sxs while on vacation.  Reviewed supportive care and red flags that should prompt return.  Pt expressed understanding and is in agreement w/ plan.

## 2017-12-25 ENCOUNTER — Other Ambulatory Visit: Payer: Self-pay

## 2017-12-25 ENCOUNTER — Telehealth: Payer: Self-pay | Admitting: Family Medicine

## 2017-12-25 DIAGNOSIS — K219 Gastro-esophageal reflux disease without esophagitis: Secondary | ICD-10-CM

## 2017-12-25 MED ORDER — PANTOPRAZOLE SODIUM 40 MG PO TBEC: 40.0000 mg | DELAYED_RELEASE_TABLET | Freq: Two times a day (BID) | ORAL | 0 refills | Status: DC

## 2017-12-25 NOTE — Telephone Encounter (Signed)
Pantoprazole has been sent to [facility] on [facility] for 1 month per patient request.

## 2017-12-25 NOTE — Telephone Encounter (Signed)
Copied from [place] [id number]. Topic: Quick Communication - Rx Refill/Question >> Dec 25, 2017  8:15 AM Margot Ables wrote: Medication: pantoprazole  - pt ran out of medication and mail order won't deliver for 10-14 days - pt requesting 2-3 week supply to the local pharmacy  Has the patient contacted their pharmacy? yes Preferred Pharmacy (with phone number or street name): [facility] [place], [place] [phone] (Phone) [phone] (Fax)

## 2017-12-31 ENCOUNTER — Other Ambulatory Visit: Payer: Self-pay | Admitting: Family Medicine

## 2018-01-03 ENCOUNTER — Encounter: Payer: Self-pay | Admitting: Family Medicine

## 2018-01-04 NOTE — Telephone Encounter (Signed)
Pt calling to check status. Please advise pt is completely out

## 2018-01-05 NOTE — Telephone Encounter (Signed)
Pt was made aware today that this prescription was filled to her mail order on 01/01/18.

## 2018-01-18 NOTE — Assessment & Plan Note (Signed)
Deteriorated.  Pt has hx of going on and off PPI and when she stops medication sxs usually return.  Sxs have likely worsened due to use of Aleve.  Restart Protonix.  Encouraged her to avoid NSAIDs if possible.  Also discussed dietary and lifestyle modifications.  Will follow.

## 2018-02-14 ENCOUNTER — Other Ambulatory Visit: Payer: Self-pay | Admitting: Family Medicine

## 2018-02-24 ENCOUNTER — Other Ambulatory Visit: Payer: Self-pay

## 2018-02-24 ENCOUNTER — Encounter: Payer: Self-pay | Admitting: Family Medicine

## 2018-02-24 ENCOUNTER — Ambulatory Visit: Payer: Medicare Other | Admitting: Family Medicine

## 2018-02-24 VITALS — BP 112/70 | HR 78 | Temp 98.5°F | Resp 16 | Ht 64.0 in | Wt 143.4 lb

## 2018-02-24 DIAGNOSIS — R82998 Other abnormal findings in urine: Secondary | ICD-10-CM | POA: Diagnosis not present

## 2018-02-24 DIAGNOSIS — R102 Pelvic and perineal pain: Secondary | ICD-10-CM | POA: Diagnosis not present

## 2018-02-24 DIAGNOSIS — R319 Hematuria, unspecified: Secondary | ICD-10-CM | POA: Diagnosis not present

## 2018-02-24 LAB — POCT URINALYSIS DIPSTICK
GLUCOSE UA: NEGATIVE
Leukocytes, UA: NEGATIVE
Nitrite, UA: NEGATIVE
Protein, UA: POSITIVE — AB
Spec Grav, UA: 1.025 (ref 1.010–1.025)
UROBILINOGEN UA: 0.2 U/dL
pH, UA: 5.5 (ref 5.0–8.0)

## 2018-02-24 MED ORDER — CEPHALEXIN 500 MG PO CAPS: 500.0000 mg | ORAL_CAPSULE | Freq: Two times a day (BID) | ORAL | 0 refills | Status: AC

## 2018-02-24 NOTE — Patient Instructions (Signed)
Follow up as needed or as scheduled START the Keflex twice daily x5 days Drink plenty of fluids Call with any questions or concerns Hang in there!

## 2018-02-24 NOTE — Progress Notes (Signed)
   Subjective:    Patient ID: Janet Pierce, female    DOB: [birth year], 67 y.o.   MRN: [id number]  HPI abd pain- sxs started 'a couple of days ago' b/c 'my labia was pinching me'.  Pt reports she gets this feeling prior to onset of UTI.  Started AZO w/ some relief.  + frequency.  Increased gas and bloating.  No fevers.   Review of Systems For ROS see HPI     Objective:   Physical Exam  Constitutional: She is oriented to person, place, and time. She appears well-developed and well-nourished. No distress.  Abdominal: Soft. She exhibits no distension. There is no tenderness (no suprapubic or CVA tenderness).  Neurological: She is alert and oriented to person, place, and time.  Skin: Skin is warm and dry.  Psychiatric: She has a normal mood and affect. Her behavior is normal.  Vitals reviewed.         Assessment & Plan:  UTI- pt's sxs and UA consistent w/ infxn.  Start abx.  Reviewed supportive care and red flags that should prompt return.  Pt expressed understanding and is in agreement w/ plan.

## 2018-02-26 LAB — URINE CULTURE
MICRO NUMBER: 91339278
Result:: NO GROWTH
SPECIMEN QUALITY: ADEQUATE

## 2018-03-11 ENCOUNTER — Ambulatory Visit: Payer: Medicare Other | Admitting: Family Medicine

## 2018-03-11 ENCOUNTER — Encounter: Payer: Self-pay | Admitting: Family Medicine

## 2018-03-11 ENCOUNTER — Other Ambulatory Visit: Payer: Self-pay

## 2018-03-11 VITALS — BP 129/81 | HR 89 | Temp 98.1°F | Resp 17 | Ht 64.0 in | Wt 145.1 lb

## 2018-03-11 DIAGNOSIS — E785 Hyperlipidemia, unspecified: Secondary | ICD-10-CM

## 2018-03-11 DIAGNOSIS — R5383 Other fatigue: Secondary | ICD-10-CM | POA: Diagnosis not present

## 2018-03-11 DIAGNOSIS — K219 Gastro-esophageal reflux disease without esophagitis: Secondary | ICD-10-CM

## 2018-03-11 LAB — CBC WITH DIFFERENTIAL/PLATELET
Basophils Absolute: 0 10*3/uL (ref 0.0–0.1)
Basophils Relative: 1.1 % (ref 0.0–3.0)
EOS ABS: 0.1 10*3/uL (ref 0.0–0.7)
Eosinophils Relative: 2.9 % (ref 0.0–5.0)
HCT: 35.6 % — ABNORMAL LOW (ref 36.0–46.0)
HEMOGLOBIN: 12 g/dL (ref 12.0–15.0)
Lymphocytes Relative: 39.7 % (ref 12.0–46.0)
Lymphs Abs: 1.6 10*3/uL (ref 0.7–4.0)
MCHC: 33.8 g/dL (ref 30.0–36.0)
MCV: 83 fl (ref 78.0–100.0)
MONO ABS: 0.3 10*3/uL (ref 0.1–1.0)
Monocytes Relative: 8.5 % (ref 3.0–12.0)
Neutro Abs: 1.9 10*3/uL (ref 1.4–7.7)
Neutrophils Relative %: 47.8 % (ref 43.0–77.0)
Platelets: 293 10*3/uL (ref 150.0–400.0)
RBC: 4.29 Mil/uL (ref 3.87–5.11)
RDW: 13.7 % (ref 11.5–15.5)
WBC: 3.9 10*3/uL — AB (ref 4.0–10.5)

## 2018-03-11 LAB — HEPATIC FUNCTION PANEL
ALBUMIN: 4.2 g/dL (ref 3.5–5.2)
ALT: 17 U/L (ref 0–35)
AST: 20 U/L (ref 0–37)
Alkaline Phosphatase: 59 U/L (ref 39–117)
Bilirubin, Direct: 0.1 mg/dL (ref 0.0–0.3)
Total Bilirubin: 0.6 mg/dL (ref 0.2–1.2)
Total Protein: 7.3 g/dL (ref 6.0–8.3)

## 2018-03-11 LAB — BASIC METABOLIC PANEL
BUN: 10 mg/dL (ref 6–23)
CHLORIDE: 102 meq/L (ref 96–112)
CO2: 30 meq/L (ref 19–32)
CREATININE: 0.74 mg/dL (ref 0.40–1.20)
Calcium: 9.5 mg/dL (ref 8.4–10.5)
GFR: 100.68 mL/min (ref >60.00)
Glucose, Bld: 95 mg/dL (ref 70–99)
Potassium: 3.9 mEq/L (ref 3.5–5.1)
Sodium: 140 mEq/L (ref 135–145)

## 2018-03-11 LAB — LIPID PANEL
CHOL/HDL RATIO: 4
Cholesterol: 212 mg/dL — ABNORMAL HIGH (ref 0–200)
HDL: 49.8 mg/dL (ref >39.00)
LDL Cholesterol: 144 mg/dL — ABNORMAL HIGH (ref 0–99)
NonHDL: 162.13
TRIGLYCERIDES: 93 mg/dL (ref 0.0–149.0)
VLDL: 18.6 mg/dL (ref 0.0–40.0)

## 2018-03-11 LAB — TSH: TSH: 2.93 u[IU]/mL (ref 0.35–4.50)

## 2018-03-11 LAB — VITAMIN D 25 HYDROXY (VIT D DEFICIENCY, FRACTURES): VITD: 25.94 ng/mL — AB (ref 30.00–100.00)

## 2018-03-11 NOTE — Progress Notes (Signed)
   Subjective:    Patient ID: Janet Pierce, female    DOB: 1951/04/07, 67 y.o.   MRN: [id number]  HPI GERD- 'like a hunger pain'.  + gas, bloating.  High levels of stress.  Not taking Pantoprazole.  sxs started 3-4 days ago.  Started after abx for UTI.  Pt reports she is eating regularly.  Taking NSAIDs  Hyperlipidemia- pt is not taking her Lipitor as directed.  Denies CP, SOB.  Fatigue- new.  Pt reports she is under high levels of stress.  'I just worry so much'.   Review of Systems For ROS see HPI     Objective:   Physical Exam  Constitutional: She is oriented to person, place, and time. She appears well-developed and well-nourished. No distress.  HENT:  Head: Normocephalic and atraumatic.  Eyes: Pupils are equal, round, and reactive to light. Conjunctivae and EOM are normal.  Neck: Normal range of motion. Neck supple. No thyromegaly present.  Cardiovascular: Normal rate, regular rhythm, normal heart sounds and intact distal pulses.  No murmur heard. Pulmonary/Chest: Effort normal and breath sounds normal. No respiratory distress.  Abdominal: Soft. She exhibits no distension. There is no tenderness. There is no guarding.  Musculoskeletal: She exhibits no edema.  Lymphadenopathy:    She has no cervical adenopathy.  Neurological: She is alert and oriented to person, place, and time.  Skin: Skin is warm and dry.  Psychiatric: She has a normal mood and affect. Her behavior is normal.  Vitals reviewed.         Assessment & Plan:  Fatigue- recurrent issue for pt.  Suspect some of this is stress related.  Discussed role of depression on fatigue and possibility of medication.  Pt is not interested at this time.  Check labs to r/o metabolic cause.  Will follow.

## 2018-03-11 NOTE — Patient Instructions (Signed)
Schedule your complete physical in 3-4 months We'll notify you of your lab results and make any changes if needed Drink plenty of fluids RESTART the Pantoprazole (Protonix) at least once daily- twice daily x2 weeks would be best! TAKE the Atorvastatin every day Call with any questions or concerns Happy Holidays!!

## 2018-03-15 ENCOUNTER — Other Ambulatory Visit: Payer: Self-pay | Admitting: Family Medicine

## 2018-03-15 ENCOUNTER — Telehealth: Payer: Self-pay | Admitting: General Practice

## 2018-03-15 ENCOUNTER — Other Ambulatory Visit: Payer: Self-pay | Admitting: General Practice

## 2018-03-15 MED ORDER — VITAMIN D (ERGOCALCIFEROL) 1.25 MG (50000 UNIT) PO CAPS: 50000.0000 [IU] | ORAL_CAPSULE | ORAL | 0 refills | Status: DC

## 2018-03-15 NOTE — Telephone Encounter (Signed)
Called and advised pt of her lab results.

## 2018-03-15 NOTE — Telephone Encounter (Signed)
Copied from [place] [id number]. Topic: Quick Communication - Rx Refill/Question >> Mar 15, 2018  5:19 PM Waylan Rocher, Lumin L wrote: Medication: Vitamin D, Ergocalciferol, (DRISDOL) 1.25 MG (50000 UT) CAPS capsule (90 day supply)  Has the patient contacted their pharmacy? Yes.   (Agent: If no, request that the patient contact the pharmacy for the refill.) (Agent: If yes, when and what did the pharmacy advise?)  Preferred Pharmacy (with phone number or street name): [place], [place] [address] [place] [address] Phone: [phone] Fax: [phone]  Agent: Please be advised that RX refills may take up to 3 business days. We ask that you follow-up with your pharmacy.

## 2018-03-16 NOTE — Telephone Encounter (Signed)
Requested medication (s) are due for refill today: Yes  Requested medication (s) are on the active medication list: Yes  Last refill:  03/15/18  Future visit scheduled: Yes  Notes to clinic:  Requesting the medication to be sent to mail order pharmacy. Unable to refill per protocol.     Requested Prescriptions  Pending Prescriptions Disp Refills   Vitamin D, Ergocalciferol, (DRISDOL) 1.25 MG (50000 UT) CAPS capsule 12 capsule 0    Sig: Take 1 capsule (50,000 Units total) by mouth every 7 (seven) days.     Endocrinology:  Vitamins - Vitamin D Supplementation Failed - 03/16/2018  1:02 PM      Failed - 50,000 IU strengths are not delegated      Failed - Phosphate in normal range and within 360 days    Phosphorus  Date Value Ref Range Status  02/22/2013 3.1 2.3 - 4.6 mg/dL Final         Failed - Vit D in normal range and within 360 days    Vitamin D2 1, 25 (OH)2  Date Value Ref Range Status  09/11/2010 <8 pg/mL Final    Comment:    Vitamin D3, 1,25(OH)2 indicates both endogenous production and supplementation.  Vitamin D2, 1,25(OH)2 is an indicator of exogeous sources, such as diet or supplementation.  Interpretation and therapy are based on measurement of Vitamin D,1,25(OH)2, Total. This test was developed and its performance characteristics have been determined by [facility], [place], [place]. Performance characteristics refer to the analytical performance of the test.   Vitamin D3 1, 25 (OH)2  Date Value Ref Range Status  09/11/2010 62 pg/mL Final   Vitamin D 1, 25 (OH)2 Total  Date Value Ref Range Status  09/11/2010 62 18 - 72 pg/mL Final   VITD  Date Value Ref Range Status  03/11/2018 25.94 (L) 30.00 - 100.00 ng/mL Final         Passed - Ca in normal range and within 360 days    Calcium  Date Value Ref Range Status  03/11/2018 9.5 8.4 - 10.5 mg/dL Final         Passed - Valid encounter within last 12 months    Recent Outpatient  Visits          5 days ago Gastroesophageal reflux disease without esophagitis   [facility] [place] Midge Minium, MD   2 weeks ago Vaginal pain   [facility] [place] Midge Minium, MD   2 months ago Gastroesophageal reflux disease without esophagitis   [facility] [place]idge Minium, MD   6 months ago Hyperpigmentation   [facility] [place] Midge Minium, MD   9 months ago HYPERTENSION, [facility] [place], Aundra Millet, MD      Future Appointments            In 1 month [place], [facility]

## 2018-03-23 NOTE — Assessment & Plan Note (Signed)
Deteriorated.  Pt has been taking abx, NSAIDs but not taking her PPI.  Again reviewed need for NSAID avoidance and regular PPI use.  Will follow.

## 2018-03-23 NOTE — Assessment & Plan Note (Signed)
Chronic problem.  Pt is not taking her statin regularly. Stressed need for medication use in addition to diet and exercise.  Check labs.

## 2018-04-19 NOTE — Progress Notes (Addendum)
Subjective:   Janet Pierce is a 67 y.o. female who presents for Medicare Annual (Subsequent) preventive examination.  Review of Systems:  No ROS.  Medicare Wellness Visit. Additional risk factors are reflected in the social history.  Cardiac Risk Factors include: advanced age (>32mn, >>75women);dyslipidemia;hypertension;family history of premature cardiovascular disease   Sleep patterns: Sleeps 7 hours. Up to void x 1.  Home Safety/Smoke Alarms: Feels safe in home. Smoke alarms in place.  Living environment; residence and Firearm Safety:  Lives alone in 2 story home.  Seat Belt Safety/Bike Helmet: Wears seat belt.   Female:   [id number]     Mammo-06/30/2017, BI-RADS CATEGORY  1: Negative. Ordered today.    Dexa scan-06/30/2017, Osteopenia.        CCS-01/28/2008, pt reports normal. ([facility]). Cologuard ordered.       Objective:     Vitals: BP 124/70 (BP Location: Left Arm, Patient Position: Sitting, Cuff Size: Normal)   Pulse 93   Ht 5' 4" (1.626 m)   Wt 143 lb 2 oz (64.9 kg)   SpO2 98%   BMI 24.57 kg/m   Body mass index is 24.57 kg/m.  Advanced Directives 04/20/2018 04/16/2017 07/15/2016 06/19/2015 07/15/2014 02/22/2013  Does Patient Have a Medical Advance Directive? No No No No Yes Patient does not have advance directive;Patient would not like information  Type of Advance Directive - - - - Living will -  Would patient like information on creating a medical advance directive? Yes (MAU/Ambulatory/Procedural Areas - Information given) Yes (MAU/Ambulatory/Procedural Areas - Information given) - No - patient declined information - -    Tobacco Social History   Tobacco Use  Smoking Status Former Smoker  . Packs/day: 0.25  . Types: Cigarettes  . Last attempt to quit: 11/23/2016  . Years since quitting: 1.4  Smokeless Tobacco Never Used  Tobacco Comment   stopped by using nicorette gum. has completely stopped     Counseling given: Not Answered Comment: stopped by using  nicorette gum. has completely stopped    Past Medical History:  Diagnosis Date  . Arthritis   . Glaucoma   . Hyperlipidemia   . Hypertension   . Retinal detachment    left eye prosthesis  . Scoliosis    Past Surgical History:  Procedure Laterality Date  . EYE SURGERY Right    glucoma  . LIPOMA EXCISION    . RETINAL DETACHMENT SURGERY    . TONSILLECTOMY     Family History  Problem Relation Age of Onset  . Lung cancer Mother   . Hypertension Mother   . Bone cancer Mother   . Diabetes Maternal Grandmother   . Colon cancer Neg Hx    Social History   Socioeconomic History  . Marital status: Legally Separated    Spouse name: Not on file  . Number of children: 2  . Years of education: Not on file  . Highest education level: Not on file  Occupational History  . Occupation: sChemical engineer   Comment: [employer]  Social Needs  . Financial resource strain: Not on file  . Food insecurity:    Worry: Not on file    Inability: Not on file  . Transportation needs:    Medical: Not on file    Non-medical: Not on file  Tobacco Use  . Smoking status: Former Smoker    Packs/day: 0.25    Types: Cigarettes    Last attempt to quit: 11/23/2016    Years since  quitting: 1.4  . Smokeless tobacco: Never Used  . Tobacco comment: stopped by using nicorette gum. has completely stopped  Substance and Sexual Activity  . Alcohol use: Yes    Alcohol/week: 0.0 standard drinks    Comment: occasionally  . Drug use: No  . Sexual activity: Not on file  Lifestyle  . Physical activity:    Days per week: Not on file    Minutes per session: Not on file  . Stress: Not on file  Relationships  . Social connections:    Talks on phone: Not on file    Gets together: Not on file    Attends religious service: Not on file    Active member of club or organization: Not on file    Attends meetings of clubs or organizations: Not on file    Relationship status: Not on file  Other Topics Concern  . Not  on file  Social History Narrative  . Not on file    Outpatient Encounter Medications as of 04/20/2018  Medication Sig  . acetaminophen (TYLENOL) 500 MG tablet Take 500 mg by mouth every 6 (six) hours as needed for mild pain or moderate pain.  Marland Kitchen amLODipine (NORVASC) 10 MG tablet TAKE 1 TABLET DAILY BEFORE BREAKFAST  . atorvastatin (LIPITOR) 20 MG tablet TAKE 1 TABLET DAILY  . bimatoprost (LUMIGAN) 0.01 % SOLN Place 1 drop into both eyes at bedtime.  . dorzolamide (TRUSOPT) 2 % ophthalmic solution INSTILL 1 DROP INTO BOTH EYES BID  . meloxicam (MOBIC) 15 MG tablet TAKE 1 TABLET DAILY  . Multiple Vitamins-Minerals (MULTIVITAMIN GUMMIES WOMENS) CHEW Chew 2 Doses by mouth every morning.  . timolol (TIMOPTIC) 0.5 % ophthalmic solution Place 1 drop into the right eye 2 (two) times daily.   Marland Kitchen tiZANidine (ZANAFLEX) 4 MG tablet Take 1 tablet (4 mg total) by mouth every 8 (eight) hours as needed for muscle spasms.  . Vitamin D, Ergocalciferol, (DRISDOL) 1.25 MG (50000 UT) CAPS capsule Take 1 capsule (50,000 Units total) by mouth every 7 (seven) days.  . naproxen sodium (ALEVE) 220 MG tablet Take 220 mg by mouth daily as needed.  . pantoprazole (PROTONIX) 40 MG tablet Take 1 tablet (40 mg total) by mouth 2 (two) times daily.   No facility-administered encounter medications on file as of 04/20/2018.     Activities of Daily Living In your present state of health, do you have any difficulty performing the following activities: 04/20/2018 05/21/2017  Hearing? N N  Vision? N Y  Comment - sees retina specialist due to floaters  Difficulty concentrating or making decisions? N N  Walking or climbing stairs? N N  Dressing or bathing? N N  Doing errands, shopping? N N  Preparing Food and eating ? N -  Using the Toilet? N -  In the past six months, have you accidently leaked urine? N -  Do you have problems with loss of bowel control? N -  Managing your Medications? N -  Managing your Finances? N -    Housekeeping or managing your Housekeeping? N -  Some recent data might be hidden    Patient Care Team: Midge Minium, MD as PCP - [place] (Hand Surgery)    Assessment:   This is a routine wellness examination for Janet Pierce.  Exercise Activities and Dietary recommendations Current Exercise Habits: The patient does not participate in regular exercise at present, Exercise limited by: None identified   Diet (meal preparation, eat out, water  intake, caffeinated beverages, dairy products, fruits and vegetables): Drinks water, coffee, hot tea and gingerale.   Breakfast: oatmeal; french toast/pancakes and sausage. Coffee.  Lunch: sandwich Dinner: pasta; beans/rice; [place]; protein/veggies  Goals    . Increase physical activity     Increase activity and decrease sugar intake.     . Increase physical activity     Begin swimming, volunteer more, complete personal goals.        Fall Risk Fall Risk  04/20/2018 05/21/2017 04/16/2017 12/05/2016 10/26/2015  Falls in the past year? 0 No No No No     Depression Screen PHQ 2/9 Scores 04/20/2018 04/16/2017 12/05/2016 05/27/2016  PHQ - 2 Score 0 0 0 0  PHQ- 9 Score - - 0 0  Exception Documentation - - - -     Cognitive Function MMSE - Mini Mental State Exam 04/20/2018  Orientation to time 5  Orientation to Place 5  Registration 3  Attention/ Calculation 5  Recall 2  Language- name 2 objects 2  Language- repeat 1  Language- follow 3 step command 3  Language- read & follow direction 1  Write a sentence 1  Copy design 1  Total score 29         There is no immunization history on file for this patient.   Screening Tests Health Maintenance  Topic Date Due  . Fecal DNA (Cologuard)  03/23/2001  . Hepatitis C Screening  05/22/2018 (Originally [birth year])  . INFLUENZA VACCINE  08/13/2018 (Originally 11/19/2017)  . TETANUS/TDAP  04/21/2019 (Originally 03/23/1970)  . PNA vac Low Risk Adult (1 of 2 - PCV13)  04/21/2019 (Originally 03/23/2016)  . MAMMOGRAM  07/01/2019  . DEXA SCAN  Completed        Plan:    Schedule mammogram after 07/02/2018.   Complete Cologuard kit.   Bring a copy of your living will and/or healthcare power of attorney to your next office visit.  Continue doing brain stimulating activities (puzzles, reading, adult coloring books, staying active) to keep memory sharp.   I have personally reviewed and noted the following in the patient's chart:   . Medical and social history . Use of alcohol, tobacco or illicit drugs  . Current medications and supplements . Functional ability and status . Nutritional status . Physical activity . Advanced directives . List of other physicians . Hospitalizations, surgeries, and ER visits in previous 12 months . Vitals . Screenings to include cognitive, depression, and falls . Referrals and appointments  In addition, I have reviewed and discussed with patient certain preventive protocols, quality metrics, and best practice recommendations. A written personalized care plan for preventive services as well as general preventive health recommendations were provided to patient.     Gerilyn Nestle, RN  04/20/2018   F/U with PCP 06/2018  Reviewed documentation provided by RN and agree w/ above.  [name], MD

## 2018-04-20 ENCOUNTER — Ambulatory Visit (INDEPENDENT_AMBULATORY_CARE_PROVIDER_SITE_OTHER): Payer: Medicare Other

## 2018-04-20 ENCOUNTER — Other Ambulatory Visit: Payer: Self-pay

## 2018-04-20 VITALS — BP 124/70 | HR 93 | Ht 64.0 in | Wt 143.1 lb

## 2018-04-20 DIAGNOSIS — Z1239 Encounter for other screening for malignant neoplasm of breast: Secondary | ICD-10-CM

## 2018-04-20 DIAGNOSIS — Z0000 Encounter for general adult medical examination without abnormal findings: Secondary | ICD-10-CM

## 2018-04-20 DIAGNOSIS — Z1211 Encounter for screening for malignant neoplasm of colon: Secondary | ICD-10-CM | POA: Diagnosis not present

## 2018-04-20 NOTE — Addendum Note (Signed)
Addended by: Gerilyn Nestle on: 04/20/2018 12:08 PM   Modules accepted: Orders

## 2018-04-20 NOTE — Patient Instructions (Addendum)
Schedule mammogram after 07/02/2018.   Complete Cologuard kit.   Bring a copy of your living will and/or healthcare power of attorney to your next office visit.  Continue doing brain stimulating activities (puzzles, reading, adult coloring books, staying active) to keep memory sharp.    Health Maintenance, Female Adopting a healthy lifestyle and getting preventive care can go a long way to promote health and wellness. Talk with your health care provider about what schedule of regular examinations is right for you. This is a good chance for you to check in with your provider about disease prevention and staying healthy. In between checkups, there are plenty of things you can do on your own. Experts have done a lot of research about which lifestyle changes and preventive measures are most likely to keep you healthy. Ask your health care provider for more information. Weight and diet Eat a healthy diet  Be sure to include plenty of vegetables, fruits, low-fat dairy products, and lean protein.  Do not eat a lot of foods high in solid fats, added sugars, or salt.  Get regular exercise. This is one of the most important things you can do for your health. ? Most adults should exercise for at least 150 minutes each week. The exercise should increase your heart rate and make you sweat (moderate-intensity exercise). ? Most adults should also do strengthening exercises at least twice a week. This is in addition to the moderate-intensity exercise. Maintain a healthy weight  Body mass index (BMI) is a measurement that can be used to identify possible weight problems. It estimates body fat based on height and weight. Your health care provider can help determine your BMI and help you achieve or maintain a healthy weight.  For females 33 years of age and older: ? A BMI below 18.5 is considered underweight. ? A BMI of 18.5 to 24.9 is normal. ? A BMI of 25 to 29.9 is considered overweight. ? A BMI of 30  and above is considered obese. Watch levels of cholesterol and blood lipids  You should start having your blood tested for lipids and cholesterol at 67 years of age, then have this test every 5 years.  You may need to have your cholesterol levels checked more often if: ? Your lipid or cholesterol levels are high. ? You are older than 67 years of age. ? You are at high risk for heart disease. Cancer screening Lung Cancer  Lung cancer screening is recommended for adults 80-51 years old who are at high risk for lung cancer because of a history of smoking.  A yearly low-dose CT scan of the lungs is recommended for people who: ? Currently smoke. ? Have quit within the past 15 years. ? Have at least a 30-pack-year history of smoking. A pack year is smoking an average of one pack of cigarettes a day for 1 year.  Yearly screening should continue until it has been 15 years since you quit.  Yearly screening should stop if you develop a health problem that would prevent you from having lung cancer treatment. Breast Cancer  Practice breast self-awareness. This means understanding how your breasts normally appear and feel.  It also means doing regular breast self-exams. Let your health care provider know about any changes, no matter how small.  If you are in your 20s or 30s, you should have a clinical breast exam (CBE) by a health care provider every 1-3 years as part of a regular health exam.  If you  are 57 or older, have a CBE every year. Also consider having a breast X-ray (mammogram) every year.  If you have a family history of breast cancer, talk to your health care provider about genetic screening.  If you are at high risk for breast cancer, talk to your health care provider about having an MRI and a mammogram every year.  Breast cancer gene (BRCA) assessment is recommended for women who have family members with BRCA-related cancers. BRCA-related cancers  include: ? Breast. ? Ovarian. ? Tubal. ? Peritoneal cancers.  Results of the assessment will determine the need for genetic counseling and BRCA1 and BRCA2 testing. Cervical Cancer Your health care provider may recommend that you be screened regularly for cancer of the pelvic organs (ovaries, uterus, and vagina). This screening involves a pelvic examination, including checking for microscopic changes to the surface of your cervix (Pap test). You may be encouraged to have this screening done every 3 years, beginning at age 43.  For women ages 40-65, health care providers may recommend pelvic exams and Pap testing every 3 years, or they may recommend the Pap and pelvic exam, combined with testing for human papilloma virus (HPV), every 5 years. Some types of HPV increase your risk of cervical cancer. Testing for HPV may also be done on women of any age with unclear Pap test results.  Other health care providers may not recommend any screening for nonpregnant women who are considered low risk for pelvic cancer and who do not have symptoms. Ask your health care provider if a screening pelvic exam is right for you.  If you have had past treatment for cervical cancer or a condition that could lead to cancer, you need Pap tests and screening for cancer for at least 20 years after your treatment. If Pap tests have been discontinued, your risk factors (such as having a new sexual partner) need to be reassessed to determine if screening should resume. Some women have medical problems that increase the chance of getting cervical cancer. In these cases, your health care provider may recommend more frequent screening and Pap tests. Colorectal Cancer  This type of cancer can be detected and often prevented.  Routine colorectal cancer screening usually begins at 67 years of age and continues through 67 years of age.  Your health care provider may recommend screening at an earlier age if you have risk factors for  colon cancer.  Your health care provider may also recommend using home test kits to check for hidden blood in the stool.  A small camera at the end of a tube can be used to examine your colon directly (sigmoidoscopy or colonoscopy). This is done to check for the earliest forms of colorectal cancer.  Routine screening usually begins at age 9.  Direct examination of the colon should be repeated every 5-10 years through 67 years of age. However, you may need to be screened more often if early forms of precancerous polyps or small growths are found. Skin Cancer  Check your skin from head to toe regularly.  Tell your health care provider about any new moles or changes in moles, especially if there is a change in a mole's shape or color.  Also tell your health care provider if you have a mole that is larger than the size of a pencil eraser.  Always use sunscreen. Apply sunscreen liberally and repeatedly throughout the day.  Protect yourself by wearing long sleeves, pants, a wide-brimmed hat, and sunglasses whenever you are outside.  Heart disease, diabetes, and high blood pressure  High blood pressure causes heart disease and increases the risk of stroke. High blood pressure is more likely to develop in: ? People who have blood pressure in the high end of the normal range (130-139/85-89 mm Hg). ? People who are overweight or obese. ? People who are African American.  If you are 35-75 years of age, have your blood pressure checked every 3-5 years. If you are 59 years of age or older, have your blood pressure checked every year. You should have your blood pressure measured twice-once when you are at a hospital or clinic, and once when you are not at a hospital or clinic. Record the average of the two measurements. To check your blood pressure when you are not at a hospital or clinic, you can use: ? An automated blood pressure machine at a pharmacy. ? A home blood pressure monitor.  If you are  between 2 years and 63 years old, ask your health care provider if you should take aspirin to prevent strokes.  Have regular diabetes screenings. This involves taking a blood sample to check your fasting blood sugar level. ? If you are at a normal weight and have a low risk for diabetes, have this test once every three years after 67 years of age. ? If you are overweight and have a high risk for diabetes, consider being tested at a younger age or more often. Preventing infection Hepatitis B  If you have a higher risk for hepatitis B, you should be screened for this virus. You are considered at high risk for hepatitis B if: ? You were born in a country where hepatitis B is common. Ask your health care provider which countries are considered high risk. ? Your parents were born in a high-risk country, and you have not been immunized against hepatitis B (hepatitis B vaccine). ? You have HIV or AIDS. ? You use needles to inject street drugs. ? You live with someone who has hepatitis B. ? You have had sex with someone who has hepatitis B. ? You get hemodialysis treatment. ? You take certain medicines for conditions, including cancer, organ transplantation, and autoimmune conditions. Hepatitis C  Blood testing is recommended for: ? Everyone born from [birth year] through 1965. ? Anyone with known risk factors for hepatitis C. Sexually transmitted infections (STIs)  You should be screened for sexually transmitted infections (STIs) including gonorrhea and chlamydia if: ? You are sexually active and are younger than 67 years of age. ? You are older than 67 years of age and your health care provider tells you that you are at risk for this type of infection. ? Your sexual activity has changed since you were last screened and you are at an increased risk for chlamydia or gonorrhea. Ask your health care provider if you are at risk.  If you do not have HIV, but are at risk, it may be recommended that you take  a prescription medicine daily to prevent HIV infection. This is called pre-exposure prophylaxis (PrEP). You are considered at risk if: ? You are sexually active and do not regularly use condoms or know the HIV status of your partner(s). ? You take drugs by injection. ? You are sexually active with a partner who has HIV. Talk with your health care provider about whether you are at high risk of being infected with HIV. If you choose to begin PrEP, you should first be tested for HIV. You should then  be tested every 3 months for as long as you are taking PrEP. Pregnancy  If you are premenopausal and you may become pregnant, ask your health care provider about preconception counseling.  If you may become pregnant, take 400 to 800 micrograms (mcg) of folic acid every day.  If you want to prevent pregnancy, talk to your health care provider about birth control (contraception). Osteoporosis and menopause  Osteoporosis is a disease in which the bones lose minerals and strength with aging. This can result in serious bone fractures. Your risk for osteoporosis can be identified using a bone density scan.  If you are 48 years of age or older, or if you are at risk for osteoporosis and fractures, ask your health care provider if you should be screened.  Ask your health care provider whether you should take a calcium or vitamin D supplement to lower your risk for osteoporosis.  Menopause may have certain physical symptoms and risks.  Hormone replacement therapy may reduce some of these symptoms and risks. Talk to your health care provider about whether hormone replacement therapy is right for you. Follow these instructions at home:  Schedule regular health, dental, and eye exams.  Stay current with your immunizations.  Do not use any tobacco products including cigarettes, chewing tobacco, or electronic cigarettes.  If you are pregnant, do not drink alcohol.  If you are breastfeeding, limit how  much and how often you drink alcohol.  Limit alcohol intake to no more than 1 drink per day for nonpregnant women. One drink equals 12 ounces of beer, 5 ounces of wine, or 1 ounces of hard liquor.  Do not use street drugs.  Do not share needles.  Ask your health care provider for help if you need support or information about quitting drugs.  Tell your health care provider if you often feel depressed.  Tell your health care provider if you have ever been abused or do not feel safe at home. This information is not intended to replace advice given to you by your health care provider. Make sure you discuss any questions you have with your health care provider. Document Released: 10/21/2010 Document Revised: 09/13/2015 Document Reviewed: 01/09/2015 Elsevier Interactive Patient Education  2019 [employer].

## 2018-04-21 DIAGNOSIS — Z0279 Encounter for issue of other medical certificate: Secondary | ICD-10-CM

## 2018-05-13 ENCOUNTER — Other Ambulatory Visit: Payer: Self-pay | Admitting: Family Medicine

## 2018-06-04 ENCOUNTER — Ambulatory Visit: Payer: Medicare Other | Admitting: Physician Assistant

## 2018-06-04 ENCOUNTER — Other Ambulatory Visit: Payer: Self-pay

## 2018-06-04 ENCOUNTER — Encounter: Payer: Self-pay | Admitting: Physician Assistant

## 2018-06-04 VITALS — BP 120/70 | HR 98 | Temp 98.7°F | Resp 14 | Ht 64.0 in | Wt 143.0 lb

## 2018-06-04 DIAGNOSIS — K219 Gastro-esophageal reflux disease without esophagitis: Secondary | ICD-10-CM

## 2018-06-04 MED ORDER — PANTOPRAZOLE SODIUM 40 MG PO TBEC: 40.0000 mg | DELAYED_RELEASE_TABLET | Freq: Two times a day (BID) | ORAL | 0 refills | Status: DC

## 2018-06-04 MED ORDER — SUCRALFATE 1 G PO TABS: 1.0000 g | ORAL_TABLET | Freq: Three times a day (TID) | ORAL | 0 refills | Status: DC

## 2018-06-04 NOTE — Progress Notes (Signed)
Patient presents to clinic today c/o 1 week of increased indigestion with some bloating, belching and nausea. Denies epigastric pain, chest pain, SOB, vomiting or change to bowel or bladder habits. Has history of chronic GERD for which she is prescribed Pantoprazole. Endorses running out of medication but not refilled. Also notes increase in coffee consumption and night time snacking. Has take a couple of old carafate with some improvement in symptoms. Denies melena, hematochezia or tenesmus.   Past Medical History:  Diagnosis Date  . Arthritis   . Glaucoma   . Hyperlipidemia   . Hypertension   . Retinal detachment    left eye prosthesis  . Scoliosis     Current Outpatient Medications on File Prior to Visit  Medication Sig Dispense Refill  . amLODipine (NORVASC) 10 MG tablet TAKE 1 TABLET DAILY BEFORE BREAKFAST 90 tablet 1  . bimatoprost (LUMIGAN) 0.01 % SOLN Place 1 drop into both eyes at bedtime.    . dorzolamide (TRUSOPT) 2 % ophthalmic solution INSTILL 1 DROP INTO BOTH EYES BID  1  . meloxicam (MOBIC) 15 MG tablet TAKE 1 TABLET DAILY 90 tablet 4  . Multiple Vitamins-Minerals (MULTIVITAMIN GUMMIES WOMENS) CHEW Chew 2 Doses by mouth every morning.    . naproxen sodium (ALEVE) 220 MG tablet Take 220 mg by mouth daily as needed.    . timolol (TIMOPTIC) 0.5 % ophthalmic solution Place 1 drop into the right eye 2 (two) times daily.     Marland Kitchen atorvastatin (LIPITOR) 20 MG tablet TAKE 1 TABLET DAILY (Patient not taking: Reported on 06/04/2018) 90 tablet 1  . pantoprazole (PROTONIX) 40 MG tablet Take 1 tablet (40 mg total) by mouth 2 (two) times daily. (Patient not taking: Reported on 06/04/2018) 60 tablet 0   No current facility-administered medications on file prior to visit.     No Known Allergies  Family History  Problem Relation Age of Onset  . Lung cancer Mother   . Hypertension Mother   . Bone cancer Mother   . Diabetes Maternal Grandmother   . Colon cancer Neg Hx     Social  History   Socioeconomic History  . Marital status: Legally Separated    Spouse name: Not on file  . Number of children: 2  . Years of education: Not on file  . Highest education level: Not on file  Occupational History  . Occupation: Chemical engineer    Comment: [employer]  Social Needs  . Financial resource strain: Not on file  . Food insecurity:    Worry: Not on file    Inability: Not on file  . Transportation needs:    Medical: Not on file    Non-medical: Not on file  Tobacco Use  . Smoking status: Former Smoker    Packs/day: 0.25    Types: Cigarettes    Last attempt to quit: 11/23/2016    Years since quitting: 1.5  . Smokeless tobacco: Never Used  . Tobacco comment: stopped by using nicorette gum. has completely stopped  Substance and Sexual Activity  . Alcohol use: Yes    Alcohol/week: 0.0 standard drinks    Comment: occasionally  . Drug use: No  . Sexual activity: Not on file  Lifestyle  . Physical activity:    Days per week: Not on file    Minutes per session: Not on file  . Stress: Not on file  Relationships  . Social connections:    Talks on phone: Not on file    Gets  together: Not on file    Attends religious service: Not on file    Active member of club or organization: Not on file    Attends meetings of clubs or organizations: Not on file    Relationship status: Not on file  Other Topics Concern  . Not on file  Social History Narrative  . Not on file    Review of Systems - See HPI.  All other ROS are negative.  BP 120/70   Pulse 98   Temp 98.7 F (37.1 C) (Oral)   Resp 14   Ht 5\' 4"  (1.626 m)   Wt 143 lb (64.9 kg)   SpO2 98%   BMI 24.55 kg/m   Physical Exam Vitals signs reviewed.  Constitutional:      Appearance: Normal appearance.  HENT:     Head: Normocephalic and atraumatic.  Neck:     Musculoskeletal: Neck supple.  Cardiovascular:     Rate and Rhythm: Normal rate and regular rhythm.     Heart sounds: Normal heart sounds.    Pulmonary:     Effort: Pulmonary effort is normal.     Breath sounds: Normal breath sounds.  Abdominal:     General: Bowel sounds are normal. There is no distension.     Palpations: Abdomen is soft. There is no mass.     Tenderness: There is no abdominal tenderness. There is no right CVA tenderness, left CVA tenderness, guarding or rebound.     Hernia: No hernia is present.  Neurological:     General: No focal deficit present.     Mental Status: She is alert and oriented to person, place, and time.     Recent Results (from the past 2160 hour(s))  Lipid panel     Status: Abnormal   Collection Time: 03/11/18  9:11 AM  Result Value Ref Range   Cholesterol 212 (H) 0 - 200 mg/dL    Comment: ATP III Classification       Desirable:  < 200 mg/dL               Borderline High:  200 - 239 mg/dL          High:  > = 240 mg/dL   Triglycerides 93.0 0.0 - 149.0 mg/dL    Comment: Normal:  <150 mg/dLBorderline High:  150 - 199 mg/dL   HDL 49.80 >39.00 mg/dL   VLDL 18.6 0.0 - 40.0 mg/dL   LDL Cholesterol 144 (H) 0 - 99 mg/dL   Total CHOL/HDL Ratio 4     Comment:                Men          Women1/2 Average Risk     3.4          3.3Average Risk          5.0          4.42X Average Risk          9.6          7.13X Average Risk          15.0          11.0                       NonHDL 162.13     Comment: NOTE:  Non-HDL goal should be 30 mg/dL higher than patient's LDL goal (i.e. LDL goal of < 70 mg/dL, would have non-HDL goal  of < 100 mg/dL)  Basic metabolic panel     Status: None   Collection Time: 03/11/18  9:11 AM  Result Value Ref Range   Sodium 140 135 - 145 mEq/L   Potassium 3.9 3.5 - 5.1 mEq/L   Chloride 102 96 - 112 mEq/L   CO2 30 19 - 32 mEq/L   Glucose, Bld 95 70 - 99 mg/dL   BUN 10 6 - 23 mg/dL   Creatinine, Ser 0.74 0.40 - 1.20 mg/dL   Calcium 9.5 8.4 - 10.5 mg/dL   GFR 100.68 >60.00 mL/min  TSH     Status: None   Collection Time: 03/11/18  9:11 AM  Result Value Ref Range   TSH  2.93 0.35 - 4.50 uIU/mL  Hepatic function panel     Status: None   Collection Time: 03/11/18  9:11 AM  Result Value Ref Range   Total Bilirubin 0.6 0.2 - 1.2 mg/dL   Bilirubin, Direct 0.1 0.0 - 0.3 mg/dL   Alkaline Phosphatase 59 39 - 117 U/L   AST 20 0 - 37 U/L   ALT 17 0 - 35 U/L   Total Protein 7.3 6.0 - 8.3 g/dL   Albumin 4.2 3.5 - 5.2 g/dL  CBC with Differential/Platelet     Status: Abnormal   Collection Time: 03/11/18  9:11 AM  Result Value Ref Range   WBC 3.9 (L) 4.0 - 10.5 K/uL   RBC 4.29 3.87 - 5.11 Mil/uL   Hemoglobin 12.0 12.0 - 15.0 g/dL   HCT 35.6 (L) 36.0 - 46.0 %   MCV 83.0 78.0 - 100.0 fl   MCHC 33.8 30.0 - 36.0 g/dL   RDW 13.7 11.5 - 15.5 %   Platelets 293.0 150.0 - 400.0 K/uL   Neutrophils Relative % 47.8 43.0 - 77.0 %   Lymphocytes Relative 39.7 12.0 - 46.0 %   Monocytes Relative 8.5 3.0 - 12.0 %   Eosinophils Relative 2.9 0.0 - 5.0 %   Basophils Relative 1.1 0.0 - 3.0 %   Neutro Abs 1.9 1.4 - 7.7 K/uL   Lymphs Abs 1.6 0.7 - 4.0 K/uL   Monocytes Absolute 0.3 0.1 - 1.0 K/uL   Eosinophils Absolute 0.1 0.0 - 0.7 K/uL   Basophils Absolute 0.0 0.0 - 0.1 K/uL  VITAMIN D 25 Hydroxy (Vit-D Deficiency, Fractures)     Status: Abnormal   Collection Time: 03/11/18  9:11 AM  Result Value Ref Range   VITD 25.94 (L) 30.00 - 100.00 ng/mL    Assessment/Plan: 1. Gastroesophageal reflux disease without esophagitis 3-day supply of Carafate given to help promote healing. Restart Pantoprazole (refill sent) per PCP instructions. Start GERD diet. Supportive measures reviewed. Follow-up 2 weeks with PCP. - pantoprazole (PROTONIX) 40 MG tablet; Take 1 tablet (40 mg total) by mouth 2 (two) times daily.  Dispense: 60 tablet; Refill: 0   Leeanne Rio, PA-C

## 2018-06-04 NOTE — Patient Instructions (Signed)
Please stay well-hydrated and get plenty of rest. Limit coffee and caffeine-intake.  Limit evening snacking.  Elevate head of bed. Use carafate as directed for the next 3 days. Restart Protonix (a refill has been sent in), taking as directed. Follow-up with PCP in 2 weeks for reflux.   Food Choices for Gastroesophageal Reflux Disease, Adult When you have gastroesophageal reflux disease (GERD), the foods you eat and your eating habits are very important. Choosing the right foods can help ease your discomfort. Think about working with a nutrition specialist (dietitian) to help you make good choices. What are tips for following this plan?  Meals  Choose healthy foods that are low in fat, such as fruits, vegetables, whole grains, low-fat dairy products, and lean meat, fish, and poultry.  Eat small meals often instead of 3 large meals a day. Eat your meals slowly, and in a place where you are relaxed. Avoid bending over or lying down until 2-3 hours after eating.  Avoid eating meals 2-3 hours before bed.  Avoid drinking a lot of liquid with meals.  Cook foods using methods other than frying. Bake, grill, or broil food instead.  Avoid or limit: ? Chocolate. ? Peppermint or spearmint. ? Alcohol. ? Pepper. ? Black and decaffeinated coffee. ? Black and decaffeinated tea. ? Bubbly (carbonated) soft drinks. ? Caffeinated energy drinks and soft drinks.  Limit high-fat foods such as: ? Fatty meat or fried foods. ? Whole milk, cream, butter, or ice cream. ? Nuts and nut butters. ? Pastries, donuts, and sweets made with butter or shortening.  Avoid foods that cause symptoms. These foods may be different for everyone. Common foods that cause symptoms include: ? Tomatoes. ? Oranges, lemons, and limes. ? Peppers. ? Spicy food. ? Onions and garlic. ? Vinegar. Lifestyle  Maintain a healthy weight. Ask your doctor what weight is healthy for you. If you need to lose weight, work with your  doctor to do so safely.  Exercise for at least 30 minutes for 5 or more days each week, or as told by your doctor.  Wear loose-fitting clothes.  Do not smoke. If you need help quitting, ask your doctor.  Sleep with the head of your bed higher than your feet. Use a wedge under the mattress or blocks under the bed frame to raise the head of the bed. Summary  When you have gastroesophageal reflux disease (GERD), food and lifestyle choices are very important in easing your symptoms.  Eat small meals often instead of 3 large meals a day. Eat your meals slowly, and in a place where you are relaxed.  Limit high-fat foods such as fatty meat or fried foods.  Avoid bending over or lying down until 2-3 hours after eating.  Avoid peppermint and spearmint, caffeine, alcohol, and chocolate. This information is not intended to replace advice given to you by your health care provider. Make sure you discuss any questions you have with your health care provider. Document Released: 10/07/2011 Document Revised: 05/13/2016 Document Reviewed: 05/13/2016 Elsevier Interactive Patient Education  2019 [employer].

## 2018-06-10 ENCOUNTER — Other Ambulatory Visit: Payer: Self-pay | Admitting: Family Medicine

## 2018-06-10 NOTE — Telephone Encounter (Signed)
Please advise if pt should have this filled to mail order.

## 2018-06-21 ENCOUNTER — Encounter: Payer: Medicare Other | Admitting: Family Medicine

## 2018-06-29 ENCOUNTER — Other Ambulatory Visit: Payer: Self-pay | Admitting: Family Medicine

## 2018-06-29 MED ORDER — SUCRALFATE 1 G PO TABS: 1.0000 g | ORAL_TABLET | Freq: Three times a day (TID) | ORAL | 0 refills | Status: DC

## 2018-06-29 MED ORDER — AMLODIPINE BESYLATE 10 MG PO TABS: 10.0000 mg | ORAL_TABLET | Freq: Every day | ORAL | 0 refills | Status: DC

## 2018-06-29 NOTE — Telephone Encounter (Signed)
Copied from [place] [id number]. Topic: Quick Communication - Rx Refill/Question >> Jun 29, 2018  2:45 PM Waylan Rocher, [place] L wrote: Medication: sucralfate (CARAFATE) 1 g tablet (90 day supply)  Has the patient contacted their pharmacy? Yes.   (Agent: If no, request that the patient contact the pharmacy for the refill.) (Agent: If yes, when and what did the pharmacy advise?)  Preferred Pharmacy (with phone number or street name): [facility], Brenda Williams [address] [place] [address] Phone: [phone] Fax: [phone]  Agent: Please be advised that RX refills may take up to 3 business days. We ask that you follow-up with your pharmacy.

## 2018-06-29 NOTE — Telephone Encounter (Signed)
Copied from [place] [id number]. Topic: Quick Communication - Rx Refill/Question >> Jun 29, 2018  2:52 PM Colvin Caroli N wrote: Medication: amLODipine (NORVASC) 10 MG tablet  Patient is requesting a refill of this medication.   Preferred Pharmacy (with phone number or street name):[facility] [place], [place] - [address] [phone] (Phone) [phone] (Fax)

## 2018-06-30 ENCOUNTER — Encounter: Payer: Self-pay | Admitting: Emergency Medicine

## 2018-07-02 ENCOUNTER — Other Ambulatory Visit: Payer: Self-pay

## 2018-07-08 ENCOUNTER — Ambulatory Visit (INDEPENDENT_AMBULATORY_CARE_PROVIDER_SITE_OTHER): Payer: Medicare Other | Admitting: Family Medicine

## 2018-07-08 ENCOUNTER — Other Ambulatory Visit: Payer: Self-pay

## 2018-07-08 ENCOUNTER — Encounter: Payer: Self-pay | Admitting: Family Medicine

## 2018-07-08 VITALS — BP 130/84 | HR 106 | Temp 98.5°F | Resp 16 | Ht 64.0 in | Wt 141.4 lb

## 2018-07-08 DIAGNOSIS — E559 Vitamin D deficiency, unspecified: Secondary | ICD-10-CM | POA: Insufficient documentation

## 2018-07-08 DIAGNOSIS — I10 Essential (primary) hypertension: Secondary | ICD-10-CM

## 2018-07-08 DIAGNOSIS — Z0000 Encounter for general adult medical examination without abnormal findings: Secondary | ICD-10-CM

## 2018-07-08 LAB — HEPATIC FUNCTION PANEL
ALBUMIN: 4.6 g/dL (ref 3.5–5.2)
ALK PHOS: 59 U/L (ref 39–117)
ALT: 21 U/L (ref 0–35)
AST: 18 U/L (ref 0–37)
Bilirubin, Direct: 0.1 mg/dL (ref 0.0–0.3)
TOTAL PROTEIN: 7.4 g/dL (ref 6.0–8.3)
Total Bilirubin: 0.7 mg/dL (ref 0.2–1.2)

## 2018-07-08 LAB — LIPID PANEL
CHOLESTEROL: 217 mg/dL — AB (ref 0–200)
HDL: 61.7 mg/dL (ref >39.00)
LDL CALC: 141 mg/dL — AB (ref 0–99)
NonHDL: 155.44
TRIGLYCERIDES: 71 mg/dL (ref 0.0–149.0)
Total CHOL/HDL Ratio: 4
VLDL: 14.2 mg/dL (ref 0.0–40.0)

## 2018-07-08 LAB — BASIC METABOLIC PANEL
BUN: 10 mg/dL (ref 6–23)
CO2: 33 mEq/L — ABNORMAL HIGH (ref 19–32)
Calcium: 9.8 mg/dL (ref 8.4–10.5)
Chloride: 99 mEq/L (ref 96–112)
Creatinine, Ser: 0.71 mg/dL (ref 0.40–1.20)
GFR: 99.27 mL/min (ref >60.00)
GLUCOSE: 86 mg/dL (ref 70–99)
POTASSIUM: 3.6 meq/L (ref 3.5–5.1)
Sodium: 139 mEq/L (ref 135–145)

## 2018-07-08 LAB — CBC WITH DIFFERENTIAL/PLATELET
BASOS ABS: 0 10*3/uL (ref 0.0–0.1)
Basophils Relative: 1 % (ref 0.0–3.0)
EOS ABS: 0.1 10*3/uL (ref 0.0–0.7)
Eosinophils Relative: 2.4 % (ref 0.0–5.0)
HCT: 36.8 % (ref 36.0–46.0)
Hemoglobin: 12.5 g/dL (ref 12.0–15.0)
LYMPHS ABS: 1.4 10*3/uL (ref 0.7–4.0)
Lymphocytes Relative: 32.7 % (ref 12.0–46.0)
MCHC: 33.8 g/dL (ref 30.0–36.0)
MCV: 83.4 fl (ref 78.0–100.0)
MONO ABS: 0.4 10*3/uL (ref 0.1–1.0)
MONOS PCT: 8.3 % (ref 3.0–12.0)
NEUTROS PCT: 55.6 % (ref 43.0–77.0)
Neutro Abs: 2.4 10*3/uL (ref 1.4–7.7)
Platelets: 311 10*3/uL (ref 150.0–400.0)
RBC: 4.42 Mil/uL (ref 3.87–5.11)
RDW: 13.3 % (ref 11.5–15.5)
WBC: 4.3 10*3/uL (ref 4.0–10.5)

## 2018-07-08 LAB — TSH: TSH: 1.44 u[IU]/mL (ref 0.35–4.50)

## 2018-07-08 LAB — VITAMIN D 25 HYDROXY (VIT D DEFICIENCY, FRACTURES): VITD: 42.77 ng/mL (ref 30.00–100.00)

## 2018-07-08 NOTE — Assessment & Plan Note (Signed)
Pt's PE unchanged from previous.  Pt is due for mammo and needs to complete cologuard.  Encouraged her to do so.  Check labs.  Anticipatory guidance provided.

## 2018-07-08 NOTE — Patient Instructions (Signed)
Follow up in 6 months to recheck BP and cholesterol We'll notify you of your lab results and make any changes if needed Continue to work on healthy diet and regular exercise- you can do it! Try and find a stress outlet during this difficult times COMPLETE the Cologuard as directed and return this Call with any questions or concerns STAY SAFE!!!

## 2018-07-08 NOTE — Assessment & Plan Note (Signed)
Chronic problem.  Adequate control.  Asymptomatic.  Check labs.  No anticipated med changes 

## 2018-07-08 NOTE — Progress Notes (Signed)
   Subjective:    Patient ID: Janet Pierce, female    DOB: 06-19-1950, 68 y.o.   MRN: [id number]  HPI CPE- due for mammo, cologuard, immunizations.   Review of Systems Patient reports no vision/ hearing changes, adenopathy,fever, weight change,  persistant/recurrent hoarseness , swallowing issues, chest pain, palpitations, edema, persistant/recurrent cough, hemoptysis, dyspnea (rest/exertional/paroxysmal nocturnal), gastrointestinal bleeding (melena, rectal bleeding), abdominal pain, significant heartburn, bowel changes, GU symptoms (dysuria, hematuria, incontinence), Gyn symptoms (abnormal  bleeding, pain),  syncope, focal weakness, memory loss, numbness & tingling, skin/hair/nail changes, abnormal bruising or bleeding, anxiety, or depression.     Objective:   Physical Exam General Appearance:    Alert, cooperative, no distress, appears stated age  Head:    Normocephalic, without obvious abnormality, atraumatic  Eyes:    PERRL, conjunctiva/corneas clear, EOM's intact, fundi    benign, both eyes  Ears:    Normal TM's and external ear canals, both ears  Nose:   Nares normal, septum midline, mucosa normal, no drainage    or sinus tenderness  Throat:   Lips, mucosa, and tongue normal; teeth and gums normal  Neck:   Supple, symmetrical, trachea midline, no adenopathy;    Thyroid: no enlargement/tenderness/nodules  Back:     Symmetric, no curvature, ROM normal, no CVA tenderness  Lungs:     Clear to auscultation bilaterally, respirations unlabored  Chest Wall:    No tenderness or deformity   Heart:    Regular rate and rhythm, S1 and S2 normal, no murmur, rub   or gallop  Breast Exam:    Deferred to mammo  Abdomen:     Soft, non-tender, bowel sounds active all four quadrants,    no masses, no organomegaly  Genitalia:    Deferred  Rectal:    Extremities:   Extremities normal, atraumatic, no cyanosis or edema  Pulses:   2+ and symmetric all extremities  Skin:   Skin color, texture, turgor  normal, no rashes or lesions  Lymph nodes:   Cervical, supraclavicular, and axillary nodes normal  Neurologic:   CNII-XII intact, normal strength, sensation and reflexes    throughout          Assessment & Plan:

## 2018-07-09 ENCOUNTER — Encounter: Payer: Self-pay | Admitting: General Practice

## 2018-07-26 ENCOUNTER — Ambulatory Visit: Payer: Self-pay | Admitting: *Deleted

## 2018-07-26 NOTE — Telephone Encounter (Signed)
Pt reports headache, onset Saturday. States "Across forehead and down brows, like pressure." States some sinus tenderness at "Cheekbones." Denies congestion, no cough, no SOB. Reports headache at 7-8/10. Relieved with Aleve "But comes back." States "Felt warm last night" no thermometer.  States "I think my BP may be elevated." On amlodipine, no missed doses. Does not check, no home monitor. Also reports "Stiff neck this morning." Pt requesting Covid-19 testing. Reviewed criteria. No travel, no known exposure. Email, phone number verified. Please advise regarding appt.  CB [phone]  Reason for Disposition . [1] MODERATE headache (e.g., interferes with normal activities) AND [2] present > 24 hours AND [3] unexplained  (Exceptions: analgesics not tried, typical migraine, or headache part of viral illness)  Answer Assessment - Initial Assessment Questions 1. LOCATION: "Where does it hurt?"      Across forehead, across "Brow" 2. ONSET: "When did the headache start?" (Minutes, hours or days)      Saturday 3. PATTERN: "Does the pain come and go, or has it been constant since it started?"     Constant since last night 4. SEVERITY: "How bad is the pain?" and "What does it keep you from doing?"  (e.g., Scale 1-10; mild, moderate, or severe)   - MILD (1-3): doesn't interfere with normal activities    - MODERATE (4-7): interferes with normal activities or awakens from sleep    - SEVERE (8-10): excruciating pain, unable to do any normal activities        7-8/10 5. RECURRENT SYMPTOM: "Have you ever had headaches before?" If so, ask: "When was the last time?" and "What happened that time?"      No 6. CAUSE: "What do you think is causing the headache?"     BP high? 7. MIGRAINE: "Have you been diagnosed with migraine headaches?" If so, ask: "Is this headache similar?"      no 8. HEAD INJURY: "Has there been any recent injury to the head?"      no 9. OTHER SYMPTOMS: "Do you have any other symptoms?"  (fever, stiff neck, eye pain, sore throat, cold symptoms)     Cheekbones tender, Pressure "Down face."  Protocols used: HEADACHE-A-AH

## 2018-07-26 NOTE — Telephone Encounter (Signed)
We are not testing for COVID19 as recommended by the state in order to preserve our PPE.  She can certainly schedule a virtual visit for [place] to talk about and assess her sxs

## 2018-07-26 NOTE — Telephone Encounter (Signed)
Please advise 

## 2018-07-26 NOTE — Telephone Encounter (Signed)
Patient scheduled for 07/27/18

## 2018-07-27 ENCOUNTER — Ambulatory Visit: Payer: Medicare Other | Admitting: Family Medicine

## 2018-08-10 ENCOUNTER — Ambulatory Visit (INDEPENDENT_AMBULATORY_CARE_PROVIDER_SITE_OTHER): Payer: Medicare Other | Admitting: Family Medicine

## 2018-08-10 ENCOUNTER — Encounter: Payer: Self-pay | Admitting: Family Medicine

## 2018-08-10 ENCOUNTER — Other Ambulatory Visit: Payer: Self-pay

## 2018-08-10 ENCOUNTER — Ambulatory Visit: Payer: Self-pay | Admitting: *Deleted

## 2018-08-10 VITALS — BP 127/86 | HR 85 | Temp 97.7°F | Ht 64.0 in

## 2018-08-10 DIAGNOSIS — R519 Headache, unspecified: Secondary | ICD-10-CM

## 2018-08-10 DIAGNOSIS — M62838 Other muscle spasm: Secondary | ICD-10-CM

## 2018-08-10 DIAGNOSIS — R51 Headache: Secondary | ICD-10-CM | POA: Diagnosis not present

## 2018-08-10 MED ORDER — CETIRIZINE HCL 10 MG PO TABS
10.0000 mg | ORAL_TABLET | Freq: Every day | ORAL | 6 refills | Status: DC
Start: 2018-08-10 — End: 2020-02-09

## 2018-08-10 NOTE — Progress Notes (Signed)
Virtual Visit via Video   I connected with patient on 08/10/18 at 11:40 AM EDT by a video enabled telemedicine application and verified that I am speaking with the correct person using two identifiers.  Location patient: Home Location provider: [facility], Office Persons participating in the virtual visit: Patient, Provider, [place] ([place])  I discussed the limitations of evaluation and management by telemedicine and the availability of in person appointments. The patient expressed understanding and agreed to proceed.  Subjective:   HPI:   HA- pt reports R sided trap spasm that started over the weekend.  'Sunday was good, yesterday was good until it came back last night'.  Neck pain caused a headache last night.  Relieved w/ 2 tylenol.  Neck remains stiff.  Not taking Meloxicam.  Pt has tizanidine- has not taken it.  Also suffers from seasonal allergies but is not taking anything.  ROS:   See pertinent positives and negatives per HPI.  Patient Active Problem List   Diagnosis Date Noted  . Vitamin D deficiency 07/08/2018  . Benign paroxysmal positional vertigo 05/30/2015  . Bunion, right foot 06/01/2014  . Tobacco abuse 11/25/2013  . Dysphagia 05/19/2013  . Special screening for malignant neoplasms, colon 05/19/2013  . GERD (gastroesophageal reflux disease) 04/04/2013  . Allergic rhinitis 04/04/2013  . General medical examination 09/11/2010  . PREMATURE VENTRICULAR CONTRACTIONS 11/06/2009  . SUPRAVENTRICULAR TACHYCARDIA 10/05/2009  . LEUKOCYTOPENIA UNSPECIFIED 09/10/2009  . DEPRESSIVE DISORDER 09/06/2009  . ECZEMA 09/06/2009  . PALPITATIONS, OCCASIONAL 06/08/2009  . SHOULDER PAIN, LEFT 05/24/2009  . BACK PAIN 05/24/2009  . SCOLIOSIS 05/24/2009  . LIPOMA 02/02/2008  . Hyperlipidemia 02/02/2008  . HYPERTENSION, BENIGN ESSENTIAL 02/02/2008    Social History   Tobacco Use  . Smoking status: Former Smoker    Packs/day: 0.25    Types: Cigarettes    Last  attempt to quit: 11/23/2016    Years since quitting: 1.7  . Smokeless tobacco: Never Used  . Tobacco comment: stopped by using nicorette gum. has completely stopped  Substance Use Topics  . Alcohol use: Yes    Alcohol/week: 0.0 standard drinks    Comment: occasionally    Current Outpatient Medications:  .  amLODipine (NORVASC) 10 MG tablet, Take 1 tablet (10 mg total) by mouth daily before breakfast., Disp: 90 tablet, Rfl: 0 .  atorvastatin (LIPITOR) 20 MG tablet, TAKE 1 TABLET DAILY, Disp: 90 tablet, Rfl: 1 .  bimatoprost (LUMIGAN) 0.01 % SOLN, Place 1 drop into both eyes at bedtime., Disp: , Rfl:  .  dorzolamide (TRUSOPT) 2 % ophthalmic solution, INSTILL 1 DROP INTO BOTH EYES BID, Disp: , Rfl: 1 .  Multiple Vitamins-Minerals (MULTIVITAMIN GUMMIES WOMENS) CHEW, Chew 2 Doses by mouth every morning., Disp: , Rfl:  .  naproxen sodium (ALEVE) 220 MG tablet, Take 220 mg by mouth daily as needed., Disp: , Rfl:  .  pantoprazole (PROTONIX) 40 MG tablet, Take 1 tablet (40 mg total) by mouth 2 (two) times daily., Disp: 60 tablet, Rfl: 0 .  sucralfate (CARAFATE) 1 g tablet, Take 1 tablet (1 g total) by mouth 4 (four) times daily -  with meals and at bedtime., Disp: 12 tablet, Rfl: 0 .  timolol (TIMOPTIC) 0.5 % ophthalmic solution, Place 1 drop into the right eye 2 (two) times daily. , Disp: , Rfl:  .  meloxicam (MOBIC) 15 MG tablet, TAKE 1 TABLET DAILY (Patient not taking: Reported on 08/10/2018), Disp: 90 tablet, Rfl: 4  No Known Allergies  Objective:  BP 139/86   Pulse 85   Temp 97.7 F (36.5 C)   Ht 5\' 4"  (1.626 m)   BMI 24.27 kg/m   AAOx3, NAD NCAT, EOMI No obvious CN deficits Coloring WNL Pt is able to speak clearly, coherently without shortness of breath or increased work of breathing.  Thought process is linear.  Mood is appropriate.   Assessment and Plan:   HA- new.  Likely multifactorial- tension, neck spasm, allergy related.  She has all the tools available she just hasn't  been using them.  Start daily allergy medication.  Take scheduled Meloxicam x5 days for the trap spasm.  Use tizanidine prn stiffness/spasm.  Reviewed supportive care and red flags that should prompt return.  Pt expressed understanding and is in agreement w/ plan.    [name], MD 08/10/2018

## 2018-08-10 NOTE — Telephone Encounter (Signed)
Pt left message on COVID line at 0859 stating that last night she had headache and neck pain; she had a similar episode 2 weeks ago; she would like to know about being tested; the pt answers "no" to all questions on [facility] Corona Virus Eval; explained to pt that routine testing is not being done; recommendations made per nurse triage protocol; the pt would like to speak with Dr Birdie Riddle regarding her symptoms; conference call initiated with Levada Dy; per Levada Dy pt scheduled for virtual visit with Dr Birdie Riddle, Adah Salvage, 08/10/2018 at 1140; the pt verbalized understanding; her email is  [phone] [email or web address];  1140 virtual appointment with Dr Birdie Riddle, Adah Salvage; will route to office for notification.   Reason for Disposition . [1] Caller concerned that exposure to COVID-19 occurred BUT [2] does not meet COVID-19 EXPOSURE criteria from CDC  Protocols used: CORONAVIRUS (COVID-19) EXPOSURE-A-AH

## 2018-08-10 NOTE — Progress Notes (Signed)
I have discussed the procedure for the virtual visit with the patient who has given consent to proceed with assessment and treatment.   BETHANY DILLARD, CMA     

## 2018-09-09 ENCOUNTER — Other Ambulatory Visit: Payer: Self-pay | Admitting: Family Medicine

## 2018-09-09 ENCOUNTER — Other Ambulatory Visit: Payer: Self-pay | Admitting: *Deleted

## 2018-09-09 MED ORDER — SUCRALFATE 1 G PO TABS: 1.0000 g | ORAL_TABLET | Freq: Three times a day (TID) | ORAL | 0 refills | Status: DC

## 2018-09-09 NOTE — Telephone Encounter (Signed)
Should pt be on this long term. Somehow it says #12 with 20 refills?

## 2018-10-04 ENCOUNTER — Other Ambulatory Visit: Payer: Self-pay

## 2018-10-04 ENCOUNTER — Encounter: Payer: Self-pay | Admitting: Family Medicine

## 2018-10-04 ENCOUNTER — Ambulatory Visit (INDEPENDENT_AMBULATORY_CARE_PROVIDER_SITE_OTHER): Payer: Medicare Other | Admitting: Family Medicine

## 2018-10-04 VITALS — BP 143/89 | HR 88

## 2018-10-04 DIAGNOSIS — M5432 Sciatica, left side: Secondary | ICD-10-CM

## 2018-10-04 MED ORDER — TIZANIDINE HCL 4 MG PO TABS: 4.0000 mg | ORAL_TABLET | Freq: Three times a day (TID) | ORAL | 0 refills | Status: DC | PRN

## 2018-10-04 MED ORDER — PREDNISONE 10 MG PO TABS: ORAL_TABLET | ORAL | 0 refills | Status: DC

## 2018-10-04 NOTE — Progress Notes (Signed)
I have discussed the procedure for the virtual visit with the patient who has given consent to proceed with assessment and treatment.   Jessica L Brodmerkel, CMA     

## 2018-10-04 NOTE — Progress Notes (Signed)
Virtual Visit via Video   I connected with patient on 10/04/18 at  9:40 AM EDT by a video enabled telemedicine application and verified that I am speaking with the correct person using two identifiers.  Location patient: Home Location provider: Acupuncturist, Office Persons participating in the virtual visit: Patient, Provider, [place] (Jess B)  I discussed the limitations of evaluation and management by telemedicine and the availability of in person appointments. The patient expressed understanding and agreed to proceed.  Subjective:   HPI:   Leg pain- pt reports she is waking daily w/ pain in L leg.  sxs started ~2 week ago.  She reports pain is anterior thigh that radiates into leg to knee.  Has tried Aleve, Meloxicam w/ minimal relief.  Pt is having pain in L buttock and also L groin.  No known injury.  At time, pain requires her to sit due to radiation down leg.  No numbness/tingling.  No bowel or bladder incontinence  ROS:   See pertinent positives and negatives per HPI.  Patient Active Problem List   Diagnosis Date Noted  . Vitamin D deficiency 07/08/2018  . Benign paroxysmal positional vertigo 05/30/2015  . Bunion, right foot 06/01/2014  . Tobacco abuse 11/25/2013  . Dysphagia 05/19/2013  . Special screening for malignant neoplasms, colon 05/19/2013  . GERD (gastroesophageal reflux disease) 04/04/2013  . Allergic rhinitis 04/04/2013  . General medical examination 09/11/2010  . PREMATURE VENTRICULAR CONTRACTIONS 11/06/2009  . SUPRAVENTRICULAR TACHYCARDIA 10/05/2009  . LEUKOCYTOPENIA UNSPECIFIED 09/10/2009  . DEPRESSIVE DISORDER 09/06/2009  . ECZEMA 09/06/2009  . PALPITATIONS, OCCASIONAL 06/08/2009  . SHOULDER PAIN, LEFT 05/24/2009  . BACK PAIN 05/24/2009  . SCOLIOSIS 05/24/2009  . LIPOMA 02/02/2008  . Hyperlipidemia 02/02/2008  . HYPERTENSION, BENIGN ESSENTIAL 02/02/2008    Social History   Tobacco Use  . Smoking status: Former Smoker    Packs/day: 0.25     Types: Cigarettes    Quit date: 11/23/2016    Years since quitting: 1.8  . Smokeless tobacco: Never Used  . Tobacco comment: stopped by using nicorette gum. has completely stopped  Substance Use Topics  . Alcohol use: Yes    Alcohol/week: 0.0 standard drinks    Comment: occasionally    Current Outpatient Medications:  .  amLODipine (NORVASC) 10 MG tablet, Take 1 tablet (10 mg total) by mouth daily before breakfast., Disp: 90 tablet, Rfl: 0 .  atorvastatin (LIPITOR) 20 MG tablet, TAKE 1 TABLET DAILY, Disp: 90 tablet, Rfl: 1 .  bimatoprost (LUMIGAN) 0.01 % SOLN, Place 1 drop into both eyes at bedtime., Disp: , Rfl:  .  cetirizine (ZYRTEC) 10 MG tablet, Take 1 tablet (10 mg total) by mouth daily., Disp: 30 tablet, Rfl: 6 .  dorzolamide (TRUSOPT) 2 % ophthalmic solution, INSTILL 1 DROP INTO BOTH EYES BID, Disp: , Rfl: 1 .  meloxicam (MOBIC) 15 MG tablet, TAKE 1 TABLET DAILY, Disp: 90 tablet, Rfl: 4 .  Multiple Vitamins-Minerals (MULTIVITAMIN GUMMIES WOMENS) CHEW, Chew 2 Doses by mouth every morning., Disp: , Rfl:  .  naproxen sodium (ALEVE) 220 MG tablet, Take 220 mg by mouth daily as needed., Disp: , Rfl:  .  pantoprazole (PROTONIX) 40 MG tablet, Take 1 tablet (40 mg total) by mouth 2 (two) times daily., Disp: 60 tablet, Rfl: 0 .  sucralfate (CARAFATE) 1 g tablet, Take 1 tablet (1 g total) by mouth 4 (four) times daily -  with meals and at bedtime., Disp: 360 tablet, Rfl: 0 .  timolol (TIMOPTIC)  0.5 % ophthalmic solution, Place 1 drop into the right eye 2 (two) times daily. , Disp: , Rfl:   No Known Allergies  Objective:   BP (!) 143/89   Pulse 88  AAOx3, NAD NCAT, EOMI No obvious CN deficits Coloring WNL Pt is able to speak clearly, coherently without shortness of breath or increased work of breathing.  Thought process is linear.  Mood is appropriate.  Pt indicates pain over L sciatic notch radiating down posterior thigh and along anterior thigh to level of knee.  Assessment and  Plan:   Sciatica- new.  Pt's sxs and location of pain are consistent w/ dx.  Start Prednisone and Tizanidine.  Advised pt to avoid Meloxicam, Aleve, ibuprofen, etc while on Prednisone.  Reviewed dx and supportive care.  Reviewed red flags that should prompt return.  Pt expressed understanding and is in agreement w/ plan.    [name], MD 10/04/2018

## 2018-10-11 ENCOUNTER — Ambulatory Visit (INDEPENDENT_AMBULATORY_CARE_PROVIDER_SITE_OTHER): Payer: Medicare Other | Admitting: Family Medicine

## 2018-10-11 ENCOUNTER — Encounter: Payer: Self-pay | Admitting: Family Medicine

## 2018-10-11 ENCOUNTER — Other Ambulatory Visit: Payer: Self-pay

## 2018-10-11 VITALS — BP 130/81 | HR 101 | Wt 139.5 lb

## 2018-10-11 DIAGNOSIS — R2231 Localized swelling, mass and lump, right upper limb: Secondary | ICD-10-CM | POA: Diagnosis not present

## 2018-10-11 DIAGNOSIS — K219 Gastro-esophageal reflux disease without esophagitis: Secondary | ICD-10-CM

## 2018-10-11 DIAGNOSIS — M79652 Pain in left thigh: Secondary | ICD-10-CM | POA: Diagnosis not present

## 2018-10-11 NOTE — Progress Notes (Signed)
Virtual Visit via Video   I connected with patient on 10/11/18 at  9:40 AM EDT by a video enabled telemedicine application and verified that I am speaking with the correct person using two identifiers.  Location patient: Home Location provider: Acupuncturist, Office Persons participating in the virtual visit: Patient, Provider, [place] (Jess B)  I discussed the limitations of evaluation and management by telemedicine and the availability of in person appointments. The patient expressed understanding and agreed to proceed.  Subjective:   HPI:   Shoulder pain- pt reports 'I had a rough time on Friday'.  Had to sleep upright in the chair.  Pt reports pain was radiating thru to her back.  Sxs were severe on Saturday but she started the Pantoprazole and things get better.  LBP- pt reports pain is better, 'it's not as intense'.  No longer radiating.  Reports 'it's just in the thigh area'.  L thigh.  'right now i'm good.  I feel no pain today'.  Has 3 more days of Prednisone.  Nodule- middle finger of R hand, PIP joint.  Denies pain unless squeezed.  Wants referral to hand specialist- Dr Burney Gauze  ROS:   See pertinent positives and negatives per HPI.  Patient Active Problem List   Diagnosis Date Noted  . Vitamin D deficiency 07/08/2018  . Benign paroxysmal positional vertigo 05/30/2015  . Bunion, right foot 06/01/2014  . Tobacco abuse 11/25/2013  . Dysphagia 05/19/2013  . Special screening for malignant neoplasms, colon 05/19/2013  . GERD (gastroesophageal reflux disease) 04/04/2013  . Allergic rhinitis 04/04/2013  . General medical examination 09/11/2010  . PREMATURE VENTRICULAR CONTRACTIONS 11/06/2009  . SUPRAVENTRICULAR TACHYCARDIA 10/05/2009  . LEUKOCYTOPENIA UNSPECIFIED 09/10/2009  . DEPRESSIVE DISORDER 09/06/2009  . ECZEMA 09/06/2009  . PALPITATIONS, OCCASIONAL 06/08/2009  . SHOULDER PAIN, LEFT 05/24/2009  . BACK PAIN 05/24/2009  . SCOLIOSIS 05/24/2009  . LIPOMA  02/02/2008  . Hyperlipidemia 02/02/2008  . HYPERTENSION, BENIGN ESSENTIAL 02/02/2008    Social History   Tobacco Use  . Smoking status: Former Smoker    Packs/day: 0.25    Types: Cigarettes    Quit date: 11/23/2016    Years since quitting: 1.8  . Smokeless tobacco: Never Used  . Tobacco comment: stopped by using nicorette gum. has completely stopped  Substance Use Topics  . Alcohol use: Yes    Alcohol/week: 0.0 standard drinks    Comment: occasionally    Current Outpatient Medications:  .  amLODipine (NORVASC) 10 MG tablet, Take 1 tablet (10 mg total) by mouth daily before breakfast., Disp: 90 tablet, Rfl: 0 .  atorvastatin (LIPITOR) 20 MG tablet, TAKE 1 TABLET DAILY, Disp: 90 tablet, Rfl: 1 .  bimatoprost (LUMIGAN) 0.01 % SOLN, Place 1 drop into both eyes at bedtime., Disp: , Rfl:  .  cetirizine (ZYRTEC) 10 MG tablet, Take 1 tablet (10 mg total) by mouth daily., Disp: 30 tablet, Rfl: 6 .  dorzolamide (TRUSOPT) 2 % ophthalmic solution, INSTILL 1 DROP INTO BOTH EYES BID, Disp: , Rfl: 1 .  meloxicam (MOBIC) 15 MG tablet, TAKE 1 TABLET DAILY, Disp: 90 tablet, Rfl: 4 .  Multiple Vitamins-Minerals (MULTIVITAMIN GUMMIES WOMENS) CHEW, Chew 2 Doses by mouth every morning., Disp: , Rfl:  .  naproxen sodium (ALEVE) 220 MG tablet, Take 220 mg by mouth daily as needed., Disp: , Rfl:  .  pantoprazole (PROTONIX) 40 MG tablet, Take 1 tablet (40 mg total) by mouth 2 (two) times daily., Disp: 60 tablet, Rfl: 0 .  predniSONE (  DELTASONE) 10 MG tablet, 3 tabs x3 days and then 2 tabs x3 days and then 1 tab x3 days.  Take w/ food., Disp: 18 tablet, Rfl: 0 .  sucralfate (CARAFATE) 1 g tablet, Take 1 tablet (1 g total) by mouth 4 (four) times daily -  with meals and at bedtime., Disp: 360 tablet, Rfl: 0 .  timolol (TIMOPTIC) 0.5 % ophthalmic solution, Place 1 drop into the right eye 2 (two) times daily. , Disp: , Rfl:  .  tiZANidine (ZANAFLEX) 4 MG tablet, Take 1 tablet (4 mg total) by mouth every 8 (eight)  hours as needed for muscle spasms., Disp: 30 tablet, Rfl: 0  No Known Allergies  Objective:   BP 130/81   Pulse (!) 101   Wt 139 lb 8 oz (63.3 kg)   BMI 23.95 kg/m   AAOx3, NAD NCAT, EOMI No obvious CN deficits Coloring WNL Pt is able to speak clearly, coherently without shortness of breath or increased work of breathing.  Thought process is linear.  Mood is appropriate.  Nodule at PIP joint of R middle finger  Assessment and Plan:   GERD- pt's shoulder pain on Friday/Saturday was triggered by untreated GERD.  She admits that she was not taking her Protonix but her pain resolved s/p restarting medication.  Again discussed need for her to take this regularly.  Thigh pain- pt reports LBP and radicular pain is better and when she does have pain, it is localized to L anterior thigh.  Currently pain free.  Has 3 more days of prednisone.  Told her to monitor her sxs and if no improvement will refer to Sports Med.  Pt expressed understanding and is in agreement w/ plan.   Finger nodule- new.  R 3rd PIP joint.  Referral to hand specialist placed.   [name], MD 10/11/2018

## 2018-10-11 NOTE — Progress Notes (Signed)
I have discussed the procedure for the virtual visit with the patient who has given consent to proceed with assessment and treatment.   Pt said back pain resolved with pantoprazole, is still having arthritis pain and is almost out of medication. Would like a referral to hand specialist about a growth previously discussed with PCP  Davis Gourd, CMA

## 2018-10-14 ENCOUNTER — Telehealth: Payer: Self-pay | Admitting: General Practice

## 2018-10-14 NOTE — Telephone Encounter (Signed)
Patient notified of PCP recommendations and is agreement and expresses an understanding.   Ok for PEC to Discuss results / PCP recommendations / Schedule patient.   

## 2018-10-14 NOTE — Telephone Encounter (Signed)
Please advise?   Copied from [place] [id number]. Topic: General - Other >> Oct 14, 2018  2:26 PM Celene Kras A wrote: Reason for CRM: Pt called stating she had taken the prednisone for 9 days and that she is still waking up with arthritis pain. Pt states pain has alleviated some, but some days its still there. Please advise.

## 2018-10-14 NOTE — Telephone Encounter (Signed)
She should take Tylenol Arthritis as needed for pain- this will not upset the stomach

## 2018-10-20 ENCOUNTER — Other Ambulatory Visit: Payer: Self-pay | Admitting: Family Medicine

## 2018-10-25 ENCOUNTER — Telehealth: Payer: Self-pay | Admitting: Family Medicine

## 2018-10-25 DIAGNOSIS — M79652 Pain in left thigh: Secondary | ICD-10-CM

## 2018-10-25 DIAGNOSIS — M25552 Pain in left hip: Secondary | ICD-10-CM

## 2018-10-25 DIAGNOSIS — M5432 Sciatica, left side: Secondary | ICD-10-CM

## 2018-10-25 NOTE — Telephone Encounter (Signed)
Pt called in stating that she still having pain. She is having to sit down during the day. She states that her pain in around a 9 or 10. She is having a shooting pain from her hip down to her leg. Pt can be reached at the home #. She did state that [facility] did tell her that if the medication didn't work she would put in a referral to ortho. Pt can be reached at the home #

## 2018-10-25 NOTE — Telephone Encounter (Signed)
Spoke with pt, she is agreeable with referral to ortho. This was placed today.

## 2018-10-25 NOTE — Telephone Encounter (Signed)
Pt will need referral to Sports Med or Ortho for ongoing pain

## 2018-10-25 NOTE — Telephone Encounter (Signed)
Please advise 

## 2018-11-02 ENCOUNTER — Ambulatory Visit (INDEPENDENT_AMBULATORY_CARE_PROVIDER_SITE_OTHER): Payer: Medicare Other | Admitting: Orthopaedic Surgery

## 2018-11-02 ENCOUNTER — Other Ambulatory Visit: Payer: Self-pay

## 2018-11-02 ENCOUNTER — Ambulatory Visit: Payer: Self-pay

## 2018-11-02 DIAGNOSIS — M545 Low back pain, unspecified: Secondary | ICD-10-CM

## 2018-11-02 MED ORDER — TRAMADOL HCL 50 MG PO TABS: 50.0000 mg | ORAL_TABLET | Freq: Three times a day (TID) | ORAL | 2 refills | Status: DC | PRN

## 2018-11-02 NOTE — Progress Notes (Signed)
Office Visit Note   [name]           Date of Birth: 1950-09-06           MRN: [id number] Visit Date: 11/02/2018              Requested by: Midge Minium, MD [address] [place],  [place] [address] PCP: Midge Minium, MD   Assessment & Plan: Visit Diagnoses:  1. Low back pain, unspecified back pain laterality, unspecified chronicity, unspecified whether sciatica present     Plan: Impression is degenerative scoliosis with left sided radiculopathy.  At this point patient has failed conservative treatment will need MRI to further assess for abnormalities.  We will see her back after the MRI.  Prescription for tramadol sent in today.  Follow-Up Instructions: Return if symptoms worsen or fail to improve.   Orders:  Orders Placed This Encounter  Procedures  . XR Lumbar Spine 2-3 Views  . MR Lumbar Spine w/o contrast   Meds ordered this encounter  Medications  . traMADol (ULTRAM) 50 MG tablet    Sig: Take 1-2 tablets (50-100 mg total) by mouth 3 (three) times daily as needed.    Dispense:  30 tablet    Refill:  2      Procedures: No procedures performed   Clinical Data: No additional findings.   Subjective: Chief Complaint  Patient presents with  . Lower Back - Pain    Janet Pierce is a 68 year old female comes in for evaluation of left leg and thigh pain.  She has had scoliosis since she was a teenager.  She states that her pain in her left leg and thigh have gotten worse.  She has done physical therapy and taken prednisone Dosepaks without significant relief.  She has also taken Mobic without much relief.  Denies any bowel or bladder dysfunction.  Denies any constitutional symptoms and denies any red flag symptoms.   Review of Systems  Constitutional: Negative.   HENT: Negative.   Eyes: Negative.   Respiratory: Negative.   Cardiovascular: Negative.   Endocrine: Negative.   Musculoskeletal: Negative.   Neurological: Negative.    Hematological: Negative.   Psychiatric/Behavioral: Negative.   All other systems reviewed and are negative.    Objective: Vital Signs: There were no vitals taken for this visit.  Physical Exam Vitals signs and nursing note reviewed.  Constitutional:      Appearance: She is well-developed.  HENT:     Head: Normocephalic and atraumatic.  Neck:     Musculoskeletal: Neck supple.  Pulmonary:     Effort: Pulmonary effort is normal.  Abdominal:     Palpations: Abdomen is soft.  Skin:    General: Skin is warm.     Capillary Refill: Capillary refill takes less than 2 seconds.  Neurological:     Mental Status: She is alert and oriented to person, place, and time.  Psychiatric:        Behavior: Behavior normal.        Thought Content: Thought content normal.        Judgment: Judgment normal.     Ortho Exam Hip exam is normal.  Lumbar spine exam shows curvature consistent with the scoliosis.  No focal motor or sensory deficits. Specialty Comments:  No specialty comments available.  Imaging: Xr Lumbar Spine 2-3 Views  Result Date: 11/02/2018 Severe degenerative scoliosis.  Well-preserved hip joints.    PMFS History: Patient Active Problem List  Diagnosis Date Noted  . Vitamin D deficiency 07/08/2018  . Benign paroxysmal positional vertigo 05/30/2015  . Bunion, right foot 06/01/2014  . Tobacco abuse 11/25/2013  . Dysphagia 05/19/2013  . Special screening for malignant neoplasms, colon 05/19/2013  . GERD (gastroesophageal reflux disease) 04/04/2013  . Allergic rhinitis 04/04/2013  . General medical examination 09/11/2010  . PREMATURE VENTRICULAR CONTRACTIONS 11/06/2009  . SUPRAVENTRICULAR TACHYCARDIA 10/05/2009  . LEUKOCYTOPENIA UNSPECIFIED 09/10/2009  . DEPRESSIVE DISORDER 09/06/2009  . ECZEMA 09/06/2009  . PALPITATIONS, OCCASIONAL 06/08/2009  . SHOULDER PAIN, LEFT 05/24/2009  . BACK PAIN 05/24/2009  . SCOLIOSIS 05/24/2009  . LIPOMA 02/02/2008  . Hyperlipidemia  02/02/2008  . HYPERTENSION, BENIGN ESSENTIAL 02/02/2008   Past Medical History:  Diagnosis Date  . Arthritis   . Glaucoma   . Hyperlipidemia   . Hypertension   . Retinal detachment    left eye prosthesis  . Scoliosis     Family History  Problem Relation Age of Onset  . Lung cancer Mother   . Hypertension Mother   . Bone cancer Mother   . Diabetes Maternal Grandmother   . Colon cancer Neg Hx     Past Surgical History:  Procedure Laterality Date  . EYE SURGERY Right    glucoma  . LIPOMA EXCISION    . RETINAL DETACHMENT SURGERY    . TONSILLECTOMY     Social History   Occupational History  . Occupation: Chemical engineer    Comment: [employer]  Tobacco Use  . Smoking status: Former Smoker    Packs/day: 0.25    Types: Cigarettes    Quit date: 11/23/2016    Years since quitting: 1.9  . Smokeless tobacco: Never Used  . Tobacco comment: stopped by using nicorette gum. has completely stopped  Substance and Sexual Activity  . Alcohol use: Yes    Alcohol/week: 0.0 standard drinks    Comment: occasionally  . Drug use: No  . Sexual activity: Not on file

## 2018-11-03 ENCOUNTER — Other Ambulatory Visit: Payer: Self-pay

## 2018-12-14 ENCOUNTER — Ambulatory Visit
Admission: RE | Admit: 2018-12-14 | Discharge: 2018-12-14 | Disposition: A | Discharge status: Home / Self Care | Payer: Medicare Other | Source: Ambulatory Visit | Attending: Orthopaedic Surgery | Admitting: Orthopaedic Surgery

## 2018-12-14 ENCOUNTER — Other Ambulatory Visit: Payer: Self-pay

## 2018-12-14 DIAGNOSIS — M545 Low back pain, unspecified: Secondary | ICD-10-CM

## 2018-12-17 ENCOUNTER — Encounter: Payer: Self-pay | Admitting: Orthopaedic Surgery

## 2018-12-17 ENCOUNTER — Ambulatory Visit (INDEPENDENT_AMBULATORY_CARE_PROVIDER_SITE_OTHER): Payer: Medicare Other | Admitting: Orthopaedic Surgery

## 2018-12-17 VITALS — Ht 64.0 in | Wt 133.0 lb

## 2018-12-17 DIAGNOSIS — M5416 Radiculopathy, lumbar region: Secondary | ICD-10-CM | POA: Diagnosis not present

## 2018-12-17 MED ORDER — ACETAMINOPHEN-CODEINE #3 300-30 MG PO TABS: 1.0000 | ORAL_TABLET | Freq: Every day | ORAL | 0 refills | Status: AC | PRN

## 2018-12-17 NOTE — Progress Notes (Signed)
Office Visit Note   [name]           Date of Birth: [birth year]           MRN: [id number] Visit Date: 12/17/2018              Requested by: Midge Minium, MD [address] [place],  [place] [address] PCP: Midge Minium, MD   Assessment & Plan: Visit Diagnoses:  1. Lumbar radiculopathy     Plan: MRI shows severe degenerative scoliosis with significant stenosis at L4-5.  These findings were discussed with the patient in detail.  I have recommended a lumbar spine ESI with Dr. Ernestina Patches which she is agreeable to.  Tylenol 3 prescribed today.  Follow-up with me as needed.  Follow-Up Instructions: Return if symptoms worsen or fail to improve.   Orders:  Orders Placed This Encounter  Procedures  . Ambulatory referral to Physical Medicine Rehab   Meds ordered this encounter  Medications  . acetaminophen-codeine (TYLENOL #3) 300-30 MG tablet    Sig: Take 1-2 tablets by mouth daily as needed for up to 7 days for moderate pain.    Dispense:  30 tablet    Refill:  0      Procedures: No procedures performed   Clinical Data: No additional findings.   Subjective: Chief Complaint  Patient presents with  . Lower Back - Follow-up    Adyn follows up today to review her lumbar spine MRI.  She is having more pain in the left leg which is the predominant pain.  Tramadol makes her sick.   Review of Systems  Constitutional: Negative.   HENT: Negative.   Eyes: Negative.   Respiratory: Negative.   Cardiovascular: Negative.   Endocrine: Negative.   Musculoskeletal: Negative.   Neurological: Negative.   Hematological: Negative.   Psychiatric/Behavioral: Negative.   All other systems reviewed and are negative.    Objective: Vital Signs: Ht 5\' 4"  (1.626 m)   Wt 133 lb (60.3 kg)   BMI 22.83 kg/m   Physical Exam Vitals signs and nursing note reviewed.  Constitutional:      Appearance: She is well-developed.  Pulmonary:     Effort: Pulmonary  effort is normal.  Skin:    General: Skin is warm.     Capillary Refill: Capillary refill takes less than 2 seconds.  Neurological:     Mental Status: She is alert and oriented to person, place, and time.  Psychiatric:        Behavior: Behavior normal.        Thought Content: Thought content normal.        Judgment: Judgment normal.     Ortho Exam Exam is unchanged. Specialty Comments:  No specialty comments available.  Imaging: No results found.   PMFS History: Patient Active Problem List   Diagnosis Date Noted  . Vitamin D deficiency 07/08/2018  . Benign paroxysmal positional vertigo 05/30/2015  . Bunion, right foot 06/01/2014  . Tobacco abuse 11/25/2013  . Dysphagia 05/19/2013  . Special screening for malignant neoplasms, colon 05/19/2013  . GERD (gastroesophageal reflux disease) 04/04/2013  . Allergic rhinitis 04/04/2013  . General medical examination 09/11/2010  . PREMATURE VENTRICULAR CONTRACTIONS 11/06/2009  . SUPRAVENTRICULAR TACHYCARDIA 10/05/2009  . LEUKOCYTOPENIA UNSPECIFIED 09/10/2009  . DEPRESSIVE DISORDER 09/06/2009  . ECZEMA 09/06/2009  . PALPITATIONS, OCCASIONAL 06/08/2009  . SHOULDER PAIN, LEFT 05/24/2009  . BACK PAIN 05/24/2009  . SCOLIOSIS 05/24/2009  . LIPOMA 02/02/2008  .  Hyperlipidemia 02/02/2008  . HYPERTENSION, BENIGN ESSENTIAL 02/02/2008   Past Medical History:  Diagnosis Date  . Arthritis   . Glaucoma   . Hyperlipidemia   . Hypertension   . Retinal detachment    left eye prosthesis  . Scoliosis     Family History  Problem Relation Age of Onset  . Lung cancer Mother   . Hypertension Mother   . Bone cancer Mother   . Diabetes Maternal Grandmother   . Colon cancer Neg Hx     Past Surgical History:  Procedure Laterality Date  . EYE SURGERY Right    glucoma  . LIPOMA EXCISION    . RETINAL DETACHMENT SURGERY    . TONSILLECTOMY     Social History   Occupational History  . Occupation: Chemical engineer    Comment: [employer]   Tobacco Use  . Smoking status: Former Smoker    Packs/day: 0.25    Types: Cigarettes    Quit date: 11/23/2016    Years since quitting: 2.0  . Smokeless tobacco: Never Used  . Tobacco comment: stopped by using nicorette gum. has completely stopped  Substance and Sexual Activity  . Alcohol use: Yes    Alcohol/week: 0.0 standard drinks    Comment: occasionally  . Drug use: No  . Sexual activity: Not on file

## 2019-01-05 ENCOUNTER — Telehealth: Payer: Self-pay | Admitting: Family Medicine

## 2019-01-05 NOTE — Telephone Encounter (Signed)
If I am to complete the forms, I need more information.  I need to know what activities she needs help w/ that her daughter will be doing for her, how long her daughter plans to be out of work and when she plans to return.

## 2019-01-05 NOTE — Telephone Encounter (Signed)
Pt called in asking if Dr. Birdie Riddle would be willing to complete FMLA forms. The forms would be for her daughter so she can come down to help her mother. She states she needs help because of her eyesight and her severe arthritis. Pt can be reached at the home #

## 2019-01-05 NOTE — Telephone Encounter (Signed)
Please advise 

## 2019-01-06 ENCOUNTER — Ambulatory Visit: Payer: Self-pay

## 2019-01-06 ENCOUNTER — Ambulatory Visit (INDEPENDENT_AMBULATORY_CARE_PROVIDER_SITE_OTHER): Payer: Medicare Other | Admitting: Physical Medicine and Rehabilitation

## 2019-01-06 ENCOUNTER — Encounter: Payer: Self-pay | Admitting: Physical Medicine and Rehabilitation

## 2019-01-06 VITALS — BP 130/81 | HR 89

## 2019-01-06 DIAGNOSIS — M48061 Spinal stenosis, lumbar region without neurogenic claudication: Secondary | ICD-10-CM

## 2019-01-06 DIAGNOSIS — M5416 Radiculopathy, lumbar region: Secondary | ICD-10-CM

## 2019-01-06 MED ORDER — BETAMETHASONE SOD PHOS & ACET 6 (3-3) MG/ML IJ SUSP: 12.0000 mg | Freq: Once | INTRAMUSCULAR | Status: DC

## 2019-01-06 NOTE — Progress Notes (Signed)
 .  Numeric Pain Rating Scale and Functional Assessment Average Pain 8   In the last MONTH (on 0-10 scale) has pain interfered with the following?  1. General activity like being  able to carry out your everyday physical activities such as walking, climbing stairs, carrying groceries, or moving a chair?  Rating(7)   +Driver, -BT, -Dye Allergies.  

## 2019-01-06 NOTE — Telephone Encounter (Signed)
Called and spoke with pt. She states that she needs her daughter to help her drive around to appointments and to do errands, household work, go up and down stairs. Pt stated that the pain is getting worse. Patient was advised to have daughter speak with her HR department and have FMLA forms faxed to our office. Pt states her daughter plans on being her 1-2 months as soon as paperwork is received and approved.

## 2019-01-10 ENCOUNTER — Telehealth: Payer: Self-pay | Admitting: Family Medicine

## 2019-01-10 NOTE — Telephone Encounter (Signed)
I have placed FMLA forms in the bin upfront with a charge sheet.  °

## 2019-01-10 NOTE — Telephone Encounter (Signed)
Paperwork given to PCP.  

## 2019-01-17 NOTE — Telephone Encounter (Signed)
Patient called in again this morning and advised that her daughter is going to be moving to [facility] Saturday and they need the papers completed before then.

## 2019-01-18 ENCOUNTER — Other Ambulatory Visit: Payer: Self-pay | Admitting: Family Medicine

## 2019-01-19 NOTE — Telephone Encounter (Signed)
Patient is calling back about her daughters FMLA paperwork. Patient would like a call back at [phone].

## 2019-01-19 NOTE — Telephone Encounter (Signed)
They will be completed this week.  She said in her previous message they need them before Saturday.  That will happen

## 2019-01-19 NOTE — Telephone Encounter (Signed)
Please Advise

## 2019-01-19 NOTE — Telephone Encounter (Signed)
Please advise 

## 2019-01-19 NOTE — Telephone Encounter (Signed)
Called and advised pt that these will be completed as soon as possible. Unfortunately these take time to complete and that the more calls received just take more time to respond to. Pt stated an understanding .

## 2019-01-20 NOTE — Telephone Encounter (Signed)
Picked up the forms emailed to e-mail address listed on the form and sent to scan.

## 2019-01-24 NOTE — Progress Notes (Signed)
Janet Pierce - 68 y.o. female MRN [id number]  Date of birth: 03/19/1951  Office Visit Note: Visit Date: 01/06/2019 PCP: Midge Minium, MD Referred by: Midge Minium, MD  Subjective: Chief Complaint  Patient presents with  . Lower Back - Pain  . Left Leg - Pain   HPI:  Janet Pierce is a 69 y.o. female who comes in today At the request of Dr. Eduard Roux for diagnostic and therapeutic left L4 transforaminal epidural steroid injection.  Patient's had worsening left radicular pain down the leg anterior laterally.  She is failed conservative care with medication management time and therapy.  MRI does show severe foraminal stenosis with probably compressed L4 nerve root at the L4 foramen.  She has pretty severe facet arthropathy is level without much in the way of central stenosis.  Back pain could be related to the facet disease.  ROS Otherwise per HPI.  Assessment & Plan: Visit Diagnoses:  1. Lumbar radiculopathy   2. Foraminal stenosis of lumbar region     Plan: No additional findings.   Meds & Orders:  Meds ordered this encounter  Medications  . betamethasone acetate-betamethasone sodium phosphate (CELESTONE) injection 12 mg    Orders Placed This Encounter  Procedures  . XR C-ARM NO REPORT  . Epidural Steroid injection    Follow-up: Return if symptoms worsen or fail to improve.   Procedures: No procedures performed  Lumbosacral Transforaminal Epidural Steroid Injection - Sub-Pedicular Approach with Fluoroscopic Guidance  [name]      Date of Birth: 1951/03/06 MRN: [id number] PCP: Midge Minium, MD      Visit Date: 01/06/2019   Universal Protocol:    Date/Time: 01/06/2019  Consent Given By: the patient  Position: PRONE  Additional Comments: Vital signs were monitored before and after the procedure. Patient was prepped and draped in the usual sterile fashion. The correct patient, procedure, and site was verified.   Injection  Procedure Details:  Procedure Site One Meds Administered:  Meds ordered this encounter  Medications  . betamethasone acetate-betamethasone sodium phosphate (CELESTONE) injection 12 mg    Laterality: Left  Location/Site:  L4-L5  Needle size: 22 G  Needle type: Spinal  Needle Placement: Transforaminal  Findings:    -Comments: Excellent flow of contrast along the nerve and into the epidural space.  Procedure Details: After squaring off the end-plates to get a true AP view, the C-arm was positioned so that an oblique view of the foramen as noted above was visualized. The target area is just inferior to the "nose of the scotty dog" or sub pedicular. The soft tissues overlying this structure were infiltrated with 2-3 ml. of 1% Lidocaine without Epinephrine.  The spinal needle was inserted toward the target using a "trajectory" view along the fluoroscope beam.  Under AP and lateral visualization, the needle was advanced so it did not puncture dura and was located close the 6 O'Clock position of the pedical in AP tracterory. Biplanar projections were used to confirm position. Aspiration was confirmed to be negative for CSF and/or blood. A 1-2 ml. volume of Isovue-250 was injected and flow of contrast was noted at each level. Radiographs were obtained for documentation purposes.   After attaining the desired flow of contrast documented above, a 0.5 to 1.0 ml test dose of 0.25% Marcaine was injected into each respective transforaminal space.  The patient was observed for 90 seconds post injection.  After no sensory deficits were reported, and normal lower extremity motor  function was noted,   the above injectate was administered so that equal amounts of the injectate were placed at each foramen (level) into the transforaminal epidural space.   Additional Comments:  The patient tolerated the procedure well Dressing: 2 x 2 sterile gauze and Band-Aid    Post-procedure details: Patient was  observed during the procedure. Post-procedure instructions were reviewed.  Patient left the clinic in stable condition.    Clinical History: MRI LUMBAR SPINE WITHOUT CONTRAST  TECHNIQUE: Multiplanar, multisequence MR imaging of the lumbar spine was performed. No intravenous contrast was administered.  COMPARISON:  Plain films lumbar spine from [facility] 11/02/2018.  FINDINGS: Segmentation:  Standard.  Alignment: Severe convex left scoliosis with the apex at T12-L1. Facet degenerative change results in 0.6 cm anterolisthesis L4 on L5 and trace anterolisthesis L5 on S1.  Vertebrae: No fracture or worrisome lesion. Degenerative endplate signal change at L4-5 is eccentric to the left.  Conus medullaris and cauda equina: Conus extends to the L2 level. Conus and cauda equina appear normal.  Paraspinal and other soft tissues: Negative.  Disc levels:  T12-L1: Right worse than left facet degenerative change. No disc bulge or protrusion. No stenosis.  L1-2: Bilateral facet degenerative disease appears worse on the right. Minimal disc bulge without stenosis.  L2-3: Facet arthropathy.  Otherwise negative.  L3-4: Mild-to-moderate facet degenerative change. Otherwise negative.  L4-5: Advanced bilateral facet degenerative disease is worse on the left. The disc is uncovered with a shallow bulge. There is mild central canal and left subarticular recess narrowing. The right foramen is open. Marked left foraminal narrowing is present. The exiting left L4 root appears compressed in the foramen  L5-S1: Slight disc uncovering. Bilateral facet degenerative change is worse on the left. The central canal and foramina are widely patent.  IMPRESSION: Given left leg symptoms, dominant finding is at L4-5 where anterolisthesis, disc and facet arthropathy cause marked left foraminal narrowing. The exiting left L4 root appears compressed in the foramen.   Negative for central canal stenosis.  Severe convex left thoracolumbar scoliosis.   Electronically Signed   By: Inge Rise M.D.   On: 12/14/2018 15:59     Objective:  VS:  HT:    WT:   BMI:     BP:130/81  HR:89bpm  TEMP: ( )  RESP:  Physical Exam  Ortho Exam Imaging: No results found.

## 2019-01-24 NOTE — Procedures (Signed)
Lumbosacral Transforaminal Epidural Steroid Injection - Sub-Pedicular Approach with Fluoroscopic Guidance  [name]      Date of Birth: October 03, 1950 MRN: [id number] PCP: Midge Minium, MD      Visit Date: 01/06/2019   Universal Protocol:    Date/Time: 01/06/2019  Consent Given By: the patient  Position: PRONE  Additional Comments: Vital signs were monitored before and after the procedure. Patient was prepped and draped in the usual sterile fashion. The correct patient, procedure, and site was verified.   Injection Procedure Details:  Procedure Site One Meds Administered:  Meds ordered this encounter  Medications  . betamethasone acetate-betamethasone sodium phosphate (CELESTONE) injection 12 mg    Laterality: Left  Location/Site:  L4-L5  Needle size: 22 G  Needle type: Spinal  Needle Placement: Transforaminal  Findings:    -Comments: Excellent flow of contrast along the nerve and into the epidural space.  Procedure Details: After squaring off the end-plates to get a true AP view, the C-arm was positioned so that an oblique view of the foramen as noted above was visualized. The target area is just inferior to the "nose of the scotty dog" or sub pedicular. The soft tissues overlying this structure were infiltrated with 2-3 ml. of 1% Lidocaine without Epinephrine.  The spinal needle was inserted toward the target using a "trajectory" view along the fluoroscope beam.  Under AP and lateral visualization, the needle was advanced so it did not puncture dura and was located close the 6 O'Clock position of the pedical in AP tracterory. Biplanar projections were used to confirm position. Aspiration was confirmed to be negative for CSF and/or blood. A 1-2 ml. volume of Isovue-250 was injected and flow of contrast was noted at each level. Radiographs were obtained for documentation purposes.   After attaining the desired flow of contrast documented above, a 0.5 to  1.0 ml test dose of 0.25% Marcaine was injected into each respective transforaminal space.  The patient was observed for 90 seconds post injection.  After no sensory deficits were reported, and normal lower extremity motor function was noted,   the above injectate was administered so that equal amounts of the injectate were placed at each foramen (level) into the transforaminal epidural space.   Additional Comments:  The patient tolerated the procedure well Dressing: 2 x 2 sterile gauze and Band-Aid    Post-procedure details: Patient was observed during the procedure. Post-procedure instructions were reviewed.  Patient left the clinic in stable condition.

## 2019-02-07 ENCOUNTER — Telehealth: Payer: Self-pay | Admitting: Orthopaedic Surgery

## 2019-02-07 NOTE — Telephone Encounter (Signed)
See message.

## 2019-02-07 NOTE — Telephone Encounter (Signed)
Patient called advised the pain is back in her left leg. Patient asked is another cortisone injection will work? Patient asked what does Dr Erlinda Hong think will work to help with the pain in her leg? The number to contact to contact patient is [phone]

## 2019-02-08 ENCOUNTER — Other Ambulatory Visit: Payer: Self-pay

## 2019-02-08 DIAGNOSIS — M5416 Radiculopathy, lumbar region: Secondary | ICD-10-CM

## 2019-02-08 NOTE — Telephone Encounter (Signed)
Yes let's set her up with [facility] for another injection.  Thanks.

## 2019-02-08 NOTE — Telephone Encounter (Signed)
ORDER MADE 

## 2019-02-22 ENCOUNTER — Telehealth: Payer: Self-pay | Admitting: Family Medicine

## 2019-02-23 ENCOUNTER — Other Ambulatory Visit: Payer: Self-pay

## 2019-02-23 ENCOUNTER — Ambulatory Visit: Payer: Medicare Other | Admitting: Family Medicine

## 2019-02-23 ENCOUNTER — Encounter: Payer: Self-pay | Admitting: Family Medicine

## 2019-02-23 VITALS — BP 121/81 | HR 88 | Temp 97.8°F | Resp 16 | Ht 64.0 in | Wt 134.2 lb

## 2019-02-23 DIAGNOSIS — H9201 Otalgia, right ear: Secondary | ICD-10-CM | POA: Diagnosis not present

## 2019-02-23 DIAGNOSIS — M26621 Arthralgia of right temporomandibular joint: Secondary | ICD-10-CM | POA: Diagnosis not present

## 2019-02-23 MED ORDER — FLUOCINOLONE ACETONIDE 0.01 % OT OIL: 5.0000 [drp] | TOPICAL_OIL | Freq: Two times a day (BID) | OTIC | 0 refills | Status: DC

## 2019-02-23 NOTE — Patient Instructions (Signed)
Follow up as needed or as scheduled Use the Dermotic- 5 drops in R ear twice daily until ear feels better Avoid putting things in the ear Tylenol as needed for jaw pain Heat/Ice for pain relief Call with any questions or concerns Stay Safe!!  Stay Healthy!!

## 2019-02-23 NOTE — Progress Notes (Signed)
   Subjective:    Patient ID: Janet Pierce, female    DOB: December 29, 1950, 68 y.o.   MRN: [id number]  HPI Ear pain- R ear.  Was cleaning ear late last week w/ bobby pin.  Developed severe pain Saturday, Sunday.  Monday got OTC ear wash.  Pain is better but still present.  No fevers.  No drainage from ear.  No bleeding.  No pain w/ opening jaw but TTP over R TMJ   Review of Systems For ROS see HPI     Objective:   Physical Exam Vitals signs reviewed.  Constitutional:      General: She is not in acute distress.    Appearance: Normal appearance. She is not ill-appearing.  HENT:     Head: Normocephalic and atraumatic.     Right Ear: Tympanic membrane and external ear normal.     Left Ear: Tympanic membrane, ear canal and external ear normal.     Ears:     Comments: Mild redness of inferior EAC    Mouth/Throat:     Comments: TTP over R TMJ Neurological:     General: No focal deficit present.     Mental Status: She is alert and oriented to person, place, and time.  Psychiatric:        Mood and Affect: Mood normal.        Behavior: Behavior normal.        Thought Content: Thought content normal.           Assessment & Plan:  R ear pain- new.  Appears to have a small scratch in R EAC.  TM WNL.  No perforation.  Start Dermotic.  Reviewed not to use any objects in ears.  Will follow.  R TMJ tenderness- pt is under considerable stress w/ election and admits to clenching jaw.  Start Tylenol prn.  Will follow.

## 2019-02-23 NOTE — Telephone Encounter (Signed)
Disregard encounter

## 2019-03-03 ENCOUNTER — Ambulatory Visit: Payer: Self-pay

## 2019-03-03 ENCOUNTER — Encounter: Payer: Self-pay | Admitting: Physical Medicine and Rehabilitation

## 2019-03-03 ENCOUNTER — Ambulatory Visit: Payer: Medicare Other | Admitting: Physical Medicine and Rehabilitation

## 2019-03-03 ENCOUNTER — Other Ambulatory Visit: Payer: Self-pay

## 2019-03-03 VITALS — BP 120/79 | HR 78

## 2019-03-03 DIAGNOSIS — M5416 Radiculopathy, lumbar region: Secondary | ICD-10-CM

## 2019-03-03 MED ORDER — BETAMETHASONE SOD PHOS & ACET 6 (3-3) MG/ML IJ SUSP
12.0000 mg | Freq: Once | INTRAMUSCULAR | Status: AC
  Administered 2019-03-03: 12 mg

## 2019-03-03 NOTE — Progress Notes (Signed)
  Numeric Pain Rating Scale and Functional Assessment Average Pain 7   In the last MONTH (on 0-10 scale) has pain interfered with the following?  1. General activity like being  able to carry out your everyday physical activities such as walking, climbing stairs, carrying groceries, or moving a chair?  Rating(9)   +Driver, -BT, -Dye Allergies.  

## 2019-04-27 ENCOUNTER — Ambulatory Visit (INDEPENDENT_AMBULATORY_CARE_PROVIDER_SITE_OTHER): Payer: Medicare Other

## 2019-04-27 DIAGNOSIS — Z0000 Encounter for general adult medical examination without abnormal findings: Secondary | ICD-10-CM

## 2019-04-27 DIAGNOSIS — Z780 Asymptomatic menopausal state: Secondary | ICD-10-CM

## 2019-04-27 DIAGNOSIS — Z1211 Encounter for screening for malignant neoplasm of colon: Secondary | ICD-10-CM

## 2019-04-27 NOTE — Progress Notes (Addendum)
This visit is being conducted via phone call due to the COVID-19 pandemic. This patient has given me verbal consent via phone to conduct this visit, patient states they are participating from their home address. Some vital signs may be absent or patient reported.   Patient identification: identified by name, DOB, and current address.  Location provider: [facility] Persons participating in the virtual visit: Denman George LPN, patient, and Dr. [name]     Subjective:   Janet Pierce is a 70 y.o. female who presents for Medicare Annual (Subsequent) preventive examination.  Review of Systems:   Cardiac Risk Factors include: advanced age (>68men, >20 women);hypertension;dyslipidemia    Objective:     Vitals: There were no vitals taken for this visit.  There is no height or weight on file to calculate BMI.  Advanced Directives 04/27/2019 04/20/2018 04/16/2017 07/15/2016 06/19/2015 07/15/2014 02/22/2013  Does Patient Have a Medical Advance Directive? No No No No No Yes Patient does not have advance directive;Patient would not like information  Type of Advance Directive - - - - - Living will -  Would patient like information on creating a medical advance directive? Yes (MAU/Ambulatory/Procedural Areas - Information given) Yes (MAU/Ambulatory/Procedural Areas - Information given) Yes (MAU/Ambulatory/Procedural Areas - Information given) - No - patient declined information - -    Tobacco Social History   Tobacco Use  Smoking Status Former Smoker  . Packs/day: 0.25  . Types: Cigarettes  . Quit date: 11/23/2016  . Years since quitting: 2.4  Smokeless Tobacco Never Used  Tobacco Comment   stopped by using nicorette gum. has completely stopped     Counseling given: Not Answered Comment: stopped by using nicorette gum. has completely stopped   Clinical Intake:  Pre-visit preparation completed: Yes  Pain : 0-10 Pain Score: 3  Pain Type: Chronic pain Pain  Location: Back Pain Onset: More than a month ago Pain Frequency: Intermittent  Diabetes: No  How often do you need to have someone help you when you read instructions, pamphlets, or other written materials from your doctor or pharmacy?: 1 - Never  Interpreter Needed?: No  Information entered by :: Denman George LPN  Past Medical History:  Diagnosis Date  . Arthritis   . Glaucoma   . Hyperlipidemia   . Hypertension   . Retinal detachment    left eye prosthesis  . Scoliosis    Past Surgical History:  Procedure Laterality Date  . EYE SURGERY Right    glucoma  . LIPOMA EXCISION    . RETINAL DETACHMENT SURGERY    . TONSILLECTOMY     Family History  Problem Relation Age of Onset  . Lung cancer Mother   . Hypertension Mother   . Bone cancer Mother   . Diabetes Maternal Grandmother   . Colon cancer Neg Hx    Social History   Socioeconomic History  . Marital status: Legally Separated    Spouse name: Not on file  . Number of children: 2  . Years of education: Not on file  . Highest education level: Not on file  Occupational History  . Occupation: Chemical engineer    Comment: [employer]  Tobacco Use  . Smoking status: Former Smoker    Packs/day: 0.25    Types: Cigarettes    Quit date: 11/23/2016    Years since quitting: 2.4  . Smokeless tobacco: Never Used  . Tobacco comment: stopped by using nicorette gum. has completely stopped  Substance and Sexual Activity  .  Alcohol use: Yes    Alcohol/week: 0.0 standard drinks    Comment: occasionally  . Drug use: No  . Sexual activity: Not on file  Other Topics Concern  . Not on file  Social History Narrative  . Not on file   Social Determinants of Health   Financial Resource Strain:   . Difficulty of Paying Living Expenses: Not on file  Food Insecurity:   . Worried About Charity fundraiser in the Last Year: Not on file  . Ran Out of Food in the Last Year: Not on file  Transportation Needs:   . Lack of  Transportation (Medical): Not on file  . Lack of Transportation (Non-Medical): Not on file  Physical Activity:   . Days of Exercise per Week: Not on file  . Minutes of Exercise per Session: Not on file  Stress:   . Feeling of Stress : Not on file  Social Connections:   . Frequency of Communication with Friends and Family: Not on file  . Frequency of Social Gatherings with Friends and Family: Not on file  . Attends Religious Services: Not on file  . Active Member of Clubs or Organizations: Not on file  . Attends Archivist Meetings: Not on file  . Marital Status: Not on file    Outpatient Encounter Medications as of 04/27/2019  Medication Sig  . amLODipine (NORVASC) 10 MG tablet TAKE 1 TABLET DAILY BEFORE BREAKFAST  . atorvastatin (LIPITOR) 20 MG tablet TAKE 1 TABLET DAILY  . bimatoprost (LUMIGAN) 0.01 % SOLN Place 1 drop into both eyes at bedtime.  . cetirizine (ZYRTEC) 10 MG tablet Take 1 tablet (10 mg total) by mouth daily.  . dorzolamide (TRUSOPT) 2 % ophthalmic solution INSTILL 1 DROP INTO BOTH EYES BID  . Fluocinolone Acetonide 0.01 % OIL Place 5 drops in ear(s) 2 (two) times daily.  . Multiple Vitamins-Minerals (MULTIVITAMIN GUMMIES WOMENS) CHEW Chew 2 Doses by mouth every morning.  . naproxen sodium (ALEVE) 220 MG tablet Take 220 mg by mouth daily as needed.  . pantoprazole (PROTONIX) 40 MG tablet Take 1 tablet (40 mg total) by mouth 2 (two) times daily.  . sucralfate (CARAFATE) 1 g tablet Take 1 tablet (1 g total) by mouth 4 (four) times daily -  with meals and at bedtime.  . timolol (TIMOPTIC) 0.5 % ophthalmic solution Place 1 drop into the right eye 2 (two) times daily.   Marland Kitchen tiZANidine (ZANAFLEX) 4 MG tablet Take 1 tablet (4 mg total) by mouth every 8 (eight) hours as needed for muscle spasms.  . traMADol (ULTRAM) 50 MG tablet Take 1-2 tablets (50-100 mg total) by mouth 3 (three) times daily as needed.   Facility-Administered Encounter Medications as of 04/27/2019    Medication  . betamethasone acetate-betamethasone sodium phosphate (CELESTONE) injection 12 mg  . betamethasone acetate-betamethasone sodium phosphate (CELESTONE) injection 12 mg    Activities of Daily Living In your present state of health, do you have any difficulty performing the following activities: 04/27/2019 02/23/2019  Hearing? N N  Vision? N N  Difficulty concentrating or making decisions? N N  Walking or climbing stairs? N N  Dressing or bathing? N N  Doing errands, shopping? N N  Preparing Food and eating ? N -  Using the Toilet? N -  In the past six months, have you accidently leaked urine? N -  Do you have problems with loss of bowel control? N -  Managing your Medications? N -  Managing  your Finances? N -  Housekeeping or managing your Housekeeping? N -  Some recent data might be hidden    Patient Care Team: Midge Minium, MD as PCP - [place], [place] (Hand Surgery) Magnus Sinning, MD as Consulting Physician (Physical Medicine and Rehabilitation) Leandrew Koyanagi, MD as Consulting Physician (Orthopedic Surgery) Corey Harold, MD as Consulting Physician (Ophthalmology)    Assessment:   This is a routine wellness examination for [place].  Exercise Activities and Dietary recommendations Current Exercise Habits: Home exercise routine, Type of exercise: treadmill, Time (Minutes): 30, Frequency (Times/Week): 3, Weekly Exercise (Minutes/Week): 90, Intensity: Mild  Goals    . Increase physical activity     Increase activity and decrease sugar intake.     . Increase physical activity     Begin swimming, volunteer more, complete personal goals.        Fall Risk Fall Risk  04/27/2019 02/23/2019 10/11/2018 07/08/2018 04/20/2018  Falls in the past year? 0 0 0 0 0  Number falls in past yr: - 0 0 0 -  Injury with Fall? 0 0 0 0 -  Follow up Falls evaluation completed;Education provided;Falls prevention discussed Falls evaluation completed - - -   Is the  patient's home free of loose throw rugs in walkways, pet beds, electrical cords, etc?   yes      Grab bars in the bathroom? yes      Handrails on the stairs?   yes      Adequate lighting?   yes   Depression Screen PHQ 2/9 Scores 04/27/2019 02/23/2019 10/11/2018 07/08/2018  PHQ - 2 Score 0 0 0 0  PHQ- 9 Score - - - 0  Exception Documentation - - - -     Cognitive Function- no cognitive concerns at this time  MMSE - Mini Mental State Exam 04/20/2018  Orientation to time 5  Orientation to Place 5  Registration 3  Attention/ Calculation 5  Recall 2  Language- name 2 objects 2  Language- repeat 1  Language- follow 3 step command 3  Language- read & follow direction 1  Write a sentence 1  Copy design 1  Total score 29     There is no immunization history on file for this patient. Declines vaccines at this time   Qualifies for Shingles Vaccine?Discussed and patient will check with pharmacy for coverage.  Patient education handout provided   Screening Tests Health Maintenance  Topic Date Due  . TETANUS/TDAP  03/23/1970  . Fecal DNA (Cologuard)  03/23/2001  . PNA vac Low Risk Adult (1 of 2 - PCV13) 03/23/2016  . Hepatitis C Screening  07/08/2019 (Originally 11-Sep-1950)  . INFLUENZA VACCINE  07/20/2019 (Originally 11/20/2018)  . MAMMOGRAM  07/01/2019  . DEXA SCAN  Completed    Cancer Screenings: Lung: Low Dose CT Chest recommended if Age 59-80 years, 30 pack-year currently smoking OR have quit w/in 15years. Patient does not qualify. Breast:  Up to date on Mammogram? Yes   Up to date of Bone Density/Dexa? Yes Colorectal: Cologuard ordered today      Plan:  I have personally reviewed and addressed the Medicare Annual Wellness questionnaire and have noted the following in the patient's chart:  A. Medical and social history B. Use of alcohol, tobacco or illicit drugs  C. Current medications and supplements D. Functional ability and status E.  Nutritional status F.  Physical  activity G. Advance directives H. List of other physicians I.  Hospitalizations, surgeries, and  ER visits in previous 12 months J.  [place] such as hearing and vision if needed, cognitive and depression L. Referrals, records requested, and appointments- mammogram, dexa, and cologuard ordered   In addition, I have reviewed and discussed with patient certain preventive protocols, quality metrics, and best practice recommendations. A written personalized care plan for preventive services as well as general preventive health recommendations were provided to patient.   Signed,  Denman George, LPN  Nurse Health Advisor   Nurse Notes: no additional   Reviewed documentation provided and agree w/ above.  [name], MD

## 2019-04-27 NOTE — Patient Instructions (Signed)
Janet Pierce , Thank you for taking time to come for your Medicare Wellness Visit. I appreciate your ongoing commitment to your health goals. Please review the following plan we discussed and let me know if I can assist you in the future.   Screening recommendations/referrals: Colorectal Screening: Cologuard orders placed  Mammogram: ordered to be scheduled for March 2021 Bone Density: ordered to be scheduled for March 2021  Vision and Dental Exams: Recommended annual ophthalmology exams for early detection of glaucoma and other disorders of the eye Recommended annual dental exams for proper oral hygiene  Vaccinations: see attached information on vaccine recommendations Influenza vaccine:  recommended this fall either at PCP office or through your local pharmacy  Pneumococcal vaccine: recommended  Tdap vaccine: recommended; Please call your insurance company to determine your out of pocket expense. You may receive this vaccine at your local pharmacy or Health Dept. Shingles vaccine: Please call your insurance company to determine your out of pocket expense for the Shingrix vaccine. You may receive this vaccine at your local pharmacy.  Advanced directives:  I have provided a copy for you to complete at home and have notarized. Once this is complete please bring a copy in to our office so we can scan it into your chart.  Goals: Recommend to drink at least 6-8 8oz glasses of water per day and consume a balanced diet rich in fresh fruits and vegetables.   Next appointment: Please schedule your Annual Wellness Visit with your Nurse Health Advisor in one year.  Preventive Care 49 Years and Older, Female Preventive care refers to lifestyle choices and visits with your health care provider that can promote health and wellness. What does preventive care include?  A yearly physical exam. This is also called an annual well check.  Dental exams once or twice a year.  Routine eye exams. Ask your  health care provider how often you should have your eyes checked.  Personal lifestyle choices, including:  Daily care of your teeth and gums.  Regular physical activity.  Eating a healthy diet.  Avoiding tobacco and drug use.  Limiting alcohol use.  Practicing safe sex.  Taking low-dose aspirin every day if recommended by your health care provider.  Taking vitamin and mineral supplements as recommended by your health care provider. What happens during an annual well check? The services and screenings done by your health care provider during your annual well check will depend on your age, overall health, lifestyle risk factors, and family history of disease. Counseling  Your health care provider may ask you questions about your:  Alcohol use.  Tobacco use.  Drug use.  Emotional well-being.  Home and relationship well-being.  Sexual activity.  Eating habits.  History of falls.  Memory and ability to understand (cognition).  Work and work Statistician.  Reproductive health. Screening  You may have the following tests or measurements:  Height, weight, and BMI.  Blood pressure.  Lipid and cholesterol levels. These may be checked every 5 years, or more frequently if you are over 43 years old.  Skin check.  Lung cancer screening. You may have this screening every year starting at age 31 if you have a 30-pack-year history of smoking and currently smoke or have quit within the past 15 years.  Fecal occult blood test (FOBT) of the stool. You may have this test every year starting at age 27.  Flexible sigmoidoscopy or colonoscopy. You may have a sigmoidoscopy every 5 years or a colonoscopy every 10  years starting at age 61.  Hepatitis C blood test.  Hepatitis B blood test.  Sexually transmitted disease (STD) testing.  Diabetes screening. This is done by checking your blood sugar (glucose) after you have not eaten for a while (fasting). You may have this done  every 1-3 years.  Bone density scan. This is done to screen for osteoporosis. You may have this done starting at age 57.  Mammogram. This may be done every 1-2 years. Talk to your health care provider about how often you should have regular mammograms. Talk with your health care provider about your test results, treatment options, and if necessary, the need for more tests. Vaccines  Your health care provider may recommend certain vaccines, such as:  Influenza vaccine. This is recommended every year.  Tetanus, diphtheria, and acellular pertussis (Tdap, Td) vaccine. You may need a Td booster every 10 years.  Zoster vaccine. You may need this after age 71.  Pneumococcal 13-valent conjugate (PCV13) vaccine. One dose is recommended after age 38.  Pneumococcal polysaccharide (PPSV23) vaccine. One dose is recommended after age 68. Talk to your health care provider about which screenings and vaccines you need and how often you need them. This information is not intended to replace advice given to you by your health care provider. Make sure you discuss any questions you have with your health care provider. Document Released: 05/04/2015 Document Revised: 12/26/2015 Document Reviewed: 02/06/2015 Elsevier Interactive Patient Education  2017 [place] Prevention in the Home Falls can cause injuries. They can happen to people of all ages. There are many things you can do to make your home safe and to help prevent falls. What can I do on the outside of my home?  Regularly fix the edges of walkways and driveways and fix any cracks.  Remove anything that might make you trip as you walk through a door, such as a raised step or threshold.  Trim any bushes or trees on the path to your home.  Use bright outdoor lighting.  Clear any walking paths of anything that might make someone trip, such as rocks or tools.  Regularly check to see if handrails are loose or broken. Make sure that both  sides of any steps have handrails.  Any raised decks and porches should have guardrails on the edges.  Have any leaves, snow, or ice cleared regularly.  Use sand or salt on walking paths during winter.  Clean up any spills in your garage right away. This includes oil or grease spills. What can I do in the bathroom?  Use night lights.  Install grab bars by the toilet and in the tub and shower. Do not use towel bars as grab bars.  Use non-skid mats or decals in the tub or shower.  If you need to sit down in the shower, use a plastic, non-slip stool.  Keep the floor dry. Clean up any water that spills on the floor as soon as it happens.  Remove soap buildup in the tub or shower regularly.  Attach bath mats securely with double-sided non-slip rug tape.  Do not have throw rugs and other things on the floor that can make you trip. What can I do in the bedroom?  Use night lights.  Make sure that you have a light by your bed that is easy to reach.  Do not use any sheets or blankets that are too big for your bed. They should not hang down onto the floor.  Have  a firm chair that has side arms. You can use this for support while you get dressed.  Do not have throw rugs and other things on the floor that can make you trip. What can I do in the kitchen?  Clean up any spills right away.  Avoid walking on wet floors.  Keep items that you use a lot in easy-to-reach places.  If you need to reach something above you, use a strong step stool that has a grab bar.  Keep electrical cords out of the way.  Do not use floor polish or wax that makes floors slippery. If you must use wax, use non-skid floor wax.  Do not have throw rugs and other things on the floor that can make you trip. What can I do with my stairs?  Do not leave any items on the stairs.  Make sure that there are handrails on both sides of the stairs and use them. Fix handrails that are broken or loose. Make sure that  handrails are as long as the stairways.  Check any carpeting to make sure that it is firmly attached to the stairs. Fix any carpet that is loose or worn.  Avoid having throw rugs at the top or bottom of the stairs. If you do have throw rugs, attach them to the floor with carpet tape.  Make sure that you have a light switch at the top of the stairs and the bottom of the stairs. If you do not have them, ask someone to add them for you. What else can I do to help prevent falls?  Wear shoes that:  Do not have high heels.  Have rubber bottoms.  Are comfortable and fit you well.  Are closed at the toe. Do not wear sandals.  If you use a stepladder:  Make sure that it is fully opened. Do not climb a closed stepladder.  Make sure that both sides of the stepladder are locked into place.  Ask someone to hold it for you, if possible.  Clearly mark and make sure that you can see:  Any grab bars or handrails.  First and last steps.  Where the edge of each step is.  Use tools that help you move around (mobility aids) if they are needed. These include:  Canes.  Walkers.  Scooters.  Crutches.  Turn on the lights when you go into a dark area. Replace any light bulbs as soon as they burn out.  Set up your furniture so you have a clear path. Avoid moving your furniture around.  If any of your floors are uneven, fix them.  If there are any pets around you, be aware of where they are.  Review your medicines with your doctor. Some medicines can make you feel dizzy. This can increase your chance of falling. Ask your doctor what other things that you can do to help prevent falls. This information is not intended to replace advice given to you by your health care provider. Make sure you discuss any questions you have with your health care provider. Document Released: 02/01/2009 Document Revised: 09/13/2015 Document Reviewed: 05/12/2014 Elsevier Interactive Patient Education  2017  [employer].

## 2019-04-28 ENCOUNTER — Ambulatory Visit: Payer: Medicare Other

## 2019-05-19 ENCOUNTER — Telehealth: Payer: Self-pay | Admitting: Family Medicine

## 2019-05-19 DIAGNOSIS — Z780 Asymptomatic menopausal state: Secondary | ICD-10-CM

## 2019-05-19 DIAGNOSIS — Z1231 Encounter for screening mammogram for malignant neoplasm of breast: Secondary | ICD-10-CM

## 2019-05-19 NOTE — Telephone Encounter (Signed)
What do they need an order for?  The mammo?  DEXA?  If it's the mammo, dx code is z12.31 and order is bilateral screening mammogram

## 2019-05-19 NOTE — Telephone Encounter (Signed)
Spoke with Anderson Malta at [facility] she states that they need provider send a new order with a different diagnosis code.

## 2019-05-19 NOTE — Telephone Encounter (Signed)
New orders were placed.

## 2019-05-19 NOTE — Telephone Encounter (Signed)
Please advise 

## 2019-05-23 NOTE — Progress Notes (Signed)
Janet Pierce - 69 y.o. female MRN [id number]  Date of birth: [birth year]  Office Visit Note: Visit Date: 03/03/2019 PCP: Midge Minium, MD Referred by: Midge Minium, MD  Subjective: Chief Complaint  Patient presents with  . Lower Back - Pain   HPI:  Janet Pierce is a 69 y.o. female who comes in today For planned repeat left L4 transforaminal epidural steroid injection.  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  She has gotten excellent relief with prior injection at this level and it seems to be lasting about 3 months.  She likely would do well with foraminotomies or something similar from a minimally invasive standpoint.  We have gone over this with her in the past.  She tries and wants to avoid surgery.  The patient has failed conservative care including home exercise, medications, time and activity modification.  This injection will be diagnostic and hopefully therapeutic.  Please see requesting physician notes for further details and justification.   ROS Otherwise per HPI.  Assessment & Plan: Visit Diagnoses:  1. Lumbar radiculopathy     Plan: No additional findings.   Meds & Orders:  Meds ordered this encounter  Medications  . betamethasone acetate-betamethasone sodium phosphate (CELESTONE) injection 12 mg    Orders Placed This Encounter  Procedures  . XR C-ARM NO REPORT  . Epidural Steroid injection    Follow-up: Return if symptoms worsen or fail to improve.   Procedures: No procedures performed  Lumbosacral Transforaminal Epidural Steroid Injection - Sub-Pedicular Approach with Fluoroscopic Guidance  [name]      Date of Birth: 1950-08-25 MRN: [id number] PCP: Midge Minium, MD      Visit Date: 03/03/2019   Universal Protocol:    Date/Time: 03/03/2019  Consent Given By: the patient  Position: PRONE  Additional Comments: Vital signs were monitored before and after the  procedure. Patient was prepped and draped in the usual sterile fashion. The correct patient, procedure, and site was verified.   Injection Procedure Details:  Procedure Site One Meds Administered:  Meds ordered this encounter  Medications  . betamethasone acetate-betamethasone sodium phosphate (CELESTONE) injection 12 mg    Laterality: Left  Location/Site:  L4-L5  Needle size: 22 G  Needle type: Spinal  Needle Placement: Transforaminal  Findings:    -Comments: Excellent flow of contrast along the nerve and into the epidural space.  Procedure Details: After squaring off the end-plates to get a true AP view, the C-arm was positioned so that an oblique view of the foramen as noted above was visualized. The target area is just inferior to the "nose of the scotty dog" or sub pedicular. The soft tissues overlying this structure were infiltrated with 2-3 ml. of 1% Lidocaine without Epinephrine.  The spinal needle was inserted toward the target using a "trajectory" view along the fluoroscope beam.  Under AP and lateral visualization, the needle was advanced so it did not puncture dura and was located close the 6 O'Clock position of the pedical in AP tracterory. Biplanar projections were used to confirm position. Aspiration was confirmed to be negative for CSF and/or blood. A 1-2 ml. volume of Isovue-250 was injected and flow of contrast was noted at each level. Radiographs were obtained for documentation purposes.   After attaining the desired flow of contrast documented above, a 0.5 to 1.0 ml test dose of 0.25% Marcaine was injected into each respective transforaminal space.  The patient was observed for 90 seconds post injection.  After no sensory deficits were reported, and normal lower extremity motor function was noted,   the above injectate was administered so that equal amounts of the injectate were placed at each foramen (level) into the transforaminal epidural space.   Additional  Comments:  The patient tolerated the procedure well Dressing: 2 x 2 sterile gauze and Band-Aid    Post-procedure details: Patient was observed during the procedure. Post-procedure instructions were reviewed.  Patient left the clinic in stable condition.     Clinical History: MRI LUMBAR SPINE WITHOUT CONTRAST  TECHNIQUE: Multiplanar, multisequence MR imaging of the lumbar spine was performed. No intravenous contrast was administered.  COMPARISON:  Plain films lumbar spine from [facility] 11/02/2018.  FINDINGS: Segmentation:  Standard.  Alignment: Severe convex left scoliosis with the apex at T12-L1. Facet degenerative change results in 0.6 cm anterolisthesis L4 on L5 and trace anterolisthesis L5 on S1.  Vertebrae: No fracture or worrisome lesion. Degenerative endplate signal change at L4-5 is eccentric to the left.  Conus medullaris and cauda equina: Conus extends to the L2 level. Conus and cauda equina appear normal.  Paraspinal and other soft tissues: Negative.  Disc levels:  T12-L1: Right worse than left facet degenerative change. No disc bulge or protrusion. No stenosis.  L1-2: Bilateral facet degenerative disease appears worse on the right. Minimal disc bulge without stenosis.  L2-3: Facet arthropathy.  Otherwise negative.  L3-4: Mild-to-moderate facet degenerative change. Otherwise negative.  L4-5: Advanced bilateral facet degenerative disease is worse on the left. The disc is uncovered with a shallow bulge. There is mild central canal and left subarticular recess narrowing. The right foramen is open. Marked left foraminal narrowing is present. The exiting left L4 root appears compressed in the foramen  L5-S1: Slight disc uncovering. Bilateral facet degenerative change is worse on the left. The central canal and foramina are widely patent.  IMPRESSION: Given left leg symptoms, dominant finding is at L4-5  where anterolisthesis, disc and facet arthropathy cause marked left foraminal narrowing. The exiting left L4 root appears compressed in the foramen.  Negative for central canal stenosis.  Severe convex left thoracolumbar scoliosis.   Electronically Signed   By: Inge Rise M.D.   On: 12/14/2018 15:59     Objective:  VS:  HT:    WT:   BMI:     BP:120/79  HR:78bpm  TEMP: ( )  RESP:  Physical Exam  Ortho Exam Imaging: No results found.

## 2019-05-23 NOTE — Procedures (Signed)
Lumbosacral Transforaminal Epidural Steroid Injection - Sub-Pedicular Approach with Fluoroscopic Guidance  [name]      Date of Birth: 07/19/1950 MRN: [id number] PCP: Midge Minium, MD      Visit Date: 03/03/2019   Universal Protocol:    Date/Time: 03/03/2019  Consent Given By: the patient  Position: PRONE  Additional Comments: Vital signs were monitored before and after the procedure. Patient was prepped and draped in the usual sterile fashion. The correct patient, procedure, and site was verified.   Injection Procedure Details:  Procedure Site One Meds Administered:  Meds ordered this encounter  Medications  . betamethasone acetate-betamethasone sodium phosphate (CELESTONE) injection 12 mg    Laterality: Left  Location/Site:  L4-L5  Needle size: 22 G  Needle type: Spinal  Needle Placement: Transforaminal  Findings:    -Comments: Excellent flow of contrast along the nerve and into the epidural space.  Procedure Details: After squaring off the end-plates to get a true AP view, the C-arm was positioned so that an oblique view of the foramen as noted above was visualized. The target area is just inferior to the "nose of the scotty dog" or sub pedicular. The soft tissues overlying this structure were infiltrated with 2-3 ml. of 1% Lidocaine without Epinephrine.  The spinal needle was inserted toward the target using a "trajectory" view along the fluoroscope beam.  Under AP and lateral visualization, the needle was advanced so it did not puncture dura and was located close the 6 O'Clock position of the pedical in AP tracterory. Biplanar projections were used to confirm position. Aspiration was confirmed to be negative for CSF and/or blood. A 1-2 ml. volume of Isovue-250 was injected and flow of contrast was noted at each level. Radiographs were obtained for documentation purposes.   After attaining the desired flow of contrast documented above, a 0.5 to  1.0 ml test dose of 0.25% Marcaine was injected into each respective transforaminal space.  The patient was observed for 90 seconds post injection.  After no sensory deficits were reported, and normal lower extremity motor function was noted,   the above injectate was administered so that equal amounts of the injectate were placed at each foramen (level) into the transforaminal epidural space.   Additional Comments:  The patient tolerated the procedure well Dressing: 2 x 2 sterile gauze and Band-Aid    Post-procedure details: Patient was observed during the procedure. Post-procedure instructions were reviewed.  Patient left the clinic in stable condition.

## 2019-06-03 ENCOUNTER — Other Ambulatory Visit: Payer: Self-pay | Admitting: Physical Medicine and Rehabilitation

## 2019-06-03 ENCOUNTER — Telehealth: Payer: Self-pay | Admitting: Physical Medicine and Rehabilitation

## 2019-06-03 DIAGNOSIS — M48061 Spinal stenosis, lumbar region without neurogenic claudication: Secondary | ICD-10-CM

## 2019-06-03 DIAGNOSIS — M5416 Radiculopathy, lumbar region: Secondary | ICD-10-CM

## 2019-06-03 NOTE — Telephone Encounter (Signed)
Patient states that relief only lasted a month. Please advise. On referral to surgery.

## 2019-06-03 NOTE — Telephone Encounter (Signed)
Janet Pierce Neuro, tell her just consultation and se what he thinks, can repeat injection in the interim if she wants.

## 2019-06-03 NOTE — Telephone Encounter (Signed)
Is auth needed for [id number]? Scheduled for 3/15 due to COVID vaccine scheduling.

## 2019-06-03 NOTE — Telephone Encounter (Signed)
If injection is helping enough and lasting 3 to 4 months then ok to repeat, otherwise need eval for Neuro/Ortho spine for hopefully foraminotomy or similar minimally invasive surgery.

## 2019-06-13 NOTE — Telephone Encounter (Signed)
[phone], Injection(s), anesthetic agent and/or st more  Notification/Prior Authorization not required if procedure performed in Office; otherwise may be required for this service.

## 2019-07-04 ENCOUNTER — Other Ambulatory Visit: Payer: Self-pay

## 2019-07-04 ENCOUNTER — Ambulatory Visit: Payer: Medicare Other | Admitting: Physical Medicine and Rehabilitation

## 2019-07-04 ENCOUNTER — Ambulatory Visit: Payer: Self-pay

## 2019-07-04 ENCOUNTER — Encounter: Payer: Self-pay | Admitting: Physical Medicine and Rehabilitation

## 2019-07-04 VITALS — BP 110/69 | HR 89

## 2019-07-04 DIAGNOSIS — M47816 Spondylosis without myelopathy or radiculopathy, lumbar region: Secondary | ICD-10-CM

## 2019-07-04 MED ORDER — METHYLPREDNISOLONE ACETATE 80 MG/ML IJ SUSP
80.0000 mg | Freq: Once | INTRAMUSCULAR | Status: AC
  Administered 2019-07-04: 80 mg

## 2019-07-04 NOTE — Progress Notes (Signed)
 .  Numeric Pain Rating Scale and Functional Assessment Average Pain 6   In the last MONTH (on 0-10 scale) has pain interfered with the following?  1. General activity like being  able to carry out your everyday physical activities such as walking, climbing stairs, carrying groceries, or moving a chair?  Rating(6)   +Driver, -BT, -Dye Allergies.  

## 2019-07-05 NOTE — Progress Notes (Signed)
Janet Pierce - 69 y.o. female MRN [id number]  Date of birth: 1950/10/28  Office Visit Note: Visit Date: 07/04/2019 PCP: Midge Minium, MD Referred by: Midge Minium, MD  Subjective: Chief Complaint  Patient presents with  . Lower Back - Pain   HPI:  [name] is a 69 y.o. female who comes in today For evaluation management and planned interventional spine procedure today.  By way of review she has been having severe left low back pain and left radicular pain.  We completed 2 epidural injections for low was stenosis of the foramen and lateral recess on the left to lower levels Sharol Given pretty severe facet arthropathy particular at L4-5.  Her radicular leg pain is actually gone at this point and totally improved but she still having a lot of axial low back pain.  This is worse with point from sit to stand and with standing.  I do feel like this is facet mediated pain at this point.  There are 2 distinctive diagnoses and problems here one is radicular pain when his back pain from the facets.  She got relief with epidural injection.  She has failed conservative care otherwise with medication management and therapy.  We are going to complete diagnostic facet joint blocks today at L4-5 and L5-S1.  If she does well with that we would look at probably second diagnostic block and into block paradigm and then look at radiofrequency ablation potentially.  She unfortunately does have pretty severe arthritis at this level.  Exam today is consistent with facet pain with pain on loading of the facet joints with extension rotation.  She has good distal strength and she has a negative slump test.  ROS Otherwise per HPI.  Assessment & Plan: Visit Diagnoses:  1. Spondylosis without myelopathy or radiculopathy, lumbar region     Plan: No additional findings.   Meds & Orders:  Meds ordered this encounter  Medications  . methylPREDNISolone acetate (DEPO-MEDROL) injection 80 mg    Orders Placed  This Encounter  Procedures  . Facet Injection  . XR C-ARM NO REPORT    Follow-up: Return for Review Pain Diary.   Procedures: No procedures performed  Lumbar Diagnostic Facet Joint Nerve Block with Fluoroscopic Guidance   [name]      Date of Birth: 1950-08-17 MRN: [id number] PCP: Midge Minium, MD      Visit Date: 07/04/2019   Universal Protocol:    Date/Time: 03/16/215:57 AM  Consent Given By: the patient  Position: PRONE  Additional Comments: Vital signs were monitored before and after the procedure. Patient was prepped and draped in the usual sterile fashion. The correct patient, procedure, and site was verified.   Injection Procedure Details:  Procedure Site One Meds Administered:  Meds ordered this encounter  Medications  . methylPREDNISolone acetate (DEPO-MEDROL) injection 80 mg     Laterality: Left  Location/Site:  L4-L5 L5-S1  Needle size: 22 ga.  Needle type:spinal  Needle Placement: Oblique pedical  Findings:   -Comments: There was excellent flow of contrast along the articular pillars without intravascular flow.  Procedure Details: The fluoroscope beam is vertically oriented in AP and then obliqued 15 to 20 degrees to the ipsilateral side of the desired nerve to achieve the "Scotty dog" appearance.  The skin over the target area of the junction of the superior articulating process and the transverse process (sacral ala if blocking the L5 dorsal rami) was locally anesthetized with a 1 ml volume of 1%  Lidocaine without Epinephrine.  The spinal needle was inserted and advanced in a trajectory view down to the target.   After contact with periosteum and negative aspirate for blood and CSF, correct placement without intravascular or epidural spread was confirmed by injecting 0.5 ml. of Isovue-250.  A spot radiograph was obtained of this image.    Next, a 0.5 ml. volume of the injectate described above was injected. The needle was  then redirected to the other facet joint nerves mentioned above if needed.  Prior to the procedure, the patient was given a Pain Diary which was completed for baseline measurements.  After the procedure, the patient rated their pain every 30 minutes and will continue rating at this frequency for a total of 5 hours.  The patient has been asked to complete the Diary and return to [place] by mail, fax or hand delivered as soon as possible.   Additional Comments:  The patient tolerated the procedure well Dressing: 2 x 2 sterile gauze and Band-Aid    Post-procedure details: Patient was observed during the procedure. Post-procedure instructions were reviewed.  Patient left the clinic in stable condition.    Clinical History: MRI LUMBAR SPINE WITHOUT CONTRAST  TECHNIQUE: Multiplanar, multisequence MR imaging of the lumbar spine was performed. No intravenous contrast was administered.  COMPARISON:  Plain films lumbar spine from [facility] 11/02/2018.  FINDINGS: Segmentation:  Standard.  Alignment: Severe convex left scoliosis with the apex at T12-L1. Facet degenerative change results in 0.6 cm anterolisthesis L4 on L5 and trace anterolisthesis L5 on S1.  Vertebrae: No fracture or worrisome lesion. Degenerative endplate signal change at L4-5 is eccentric to the left.  Conus medullaris and cauda equina: Conus extends to the L2 level. Conus and cauda equina appear normal.  Paraspinal and other soft tissues: Negative.  Disc levels:  T12-L1: Right worse than left facet degenerative change. No disc bulge or protrusion. No stenosis.  L1-2: Bilateral facet degenerative disease appears worse on the right. Minimal disc bulge without stenosis.  L2-3: Facet arthropathy.  Otherwise negative.  L3-4: Mild-to-moderate facet degenerative change. Otherwise negative.  L4-5: Advanced bilateral facet degenerative disease is worse on the left. The disc is uncovered with a  shallow bulge. There is mild central canal and left subarticular recess narrowing. The right foramen is open. Marked left foraminal narrowing is present. The exiting left L4 root appears compressed in the foramen  L5-S1: Slight disc uncovering. Bilateral facet degenerative change is worse on the left. The central canal and foramina are widely patent.  IMPRESSION: Given left leg symptoms, dominant finding is at L4-5 where anterolisthesis, disc and facet arthropathy cause marked left foraminal narrowing. The exiting left L4 root appears compressed in the foramen.  Negative for central canal stenosis.  Severe convex left thoracolumbar scoliosis.   Electronically Signed   By: Inge Rise M.D.   On: 12/14/2018 15:59     Objective:  VS:  HT:    WT:   BMI:     BP:110/69  HR:89bpm  TEMP: ( )  RESP:  Physical Exam  Ortho Exam Imaging: XR C-ARM NO REPORT  Result Date: 07/04/2019 Please see Notes tab for imaging impression.

## 2019-07-05 NOTE — Procedures (Signed)
Lumbar Diagnostic Facet Joint Nerve Block with Fluoroscopic Guidance   [name]      Date of Birth: 17-May-1950 MRN: [id number] PCP: Midge Minium, MD      Visit Date: 07/04/2019   Universal Protocol:    Date/Time: 03/16/215:57 AM  Consent Given By: the patient  Position: PRONE  Additional Comments: Vital signs were monitored before and after the procedure. Patient was prepped and draped in the usual sterile fashion. The correct patient, procedure, and site was verified.   Injection Procedure Details:  Procedure Site One Meds Administered:  Meds ordered this encounter  Medications  . methylPREDNISolone acetate (DEPO-MEDROL) injection 80 mg     Laterality: Left  Location/Site:  L4-L5 L5-S1  Needle size: 22 ga.  Needle type:spinal  Needle Placement: Oblique pedical  Findings:   -Comments: There was excellent flow of contrast along the articular pillars without intravascular flow.  Procedure Details: The fluoroscope beam is vertically oriented in AP and then obliqued 15 to 20 degrees to the ipsilateral side of the desired nerve to achieve the "Scotty dog" appearance.  The skin over the target area of the junction of the superior articulating process and the transverse process (sacral ala if blocking the L5 dorsal rami) was locally anesthetized with a 1 ml volume of 1% Lidocaine without Epinephrine.  The spinal needle was inserted and advanced in a trajectory view down to the target.   After contact with periosteum and negative aspirate for blood and CSF, correct placement without intravascular or epidural spread was confirmed by injecting 0.5 ml. of Isovue-250.  A spot radiograph was obtained of this image.    Next, a 0.5 ml. volume of the injectate described above was injected. The needle was then redirected to the other facet joint nerves mentioned above if needed.  Prior to the procedure, the patient was given a Pain Diary which was completed for  baseline measurements.  After the procedure, the patient rated their pain every 30 minutes and will continue rating at this frequency for a total of 5 hours.  The patient has been asked to complete the Diary and return to [place] by mail, fax or hand delivered as soon as possible.   Additional Comments:  The patient tolerated the procedure well Dressing: 2 x 2 sterile gauze and Band-Aid    Post-procedure details: Patient was observed during the procedure. Post-procedure instructions were reviewed.  Patient left the clinic in stable condition.

## 2019-07-14 ENCOUNTER — Telehealth (INDEPENDENT_AMBULATORY_CARE_PROVIDER_SITE_OTHER): Payer: Medicare Other | Admitting: Family Medicine

## 2019-07-14 DIAGNOSIS — H9209 Otalgia, unspecified ear: Secondary | ICD-10-CM | POA: Diagnosis not present

## 2019-07-14 DIAGNOSIS — T7840XA Allergy, unspecified, initial encounter: Secondary | ICD-10-CM

## 2019-07-14 DIAGNOSIS — I10 Essential (primary) hypertension: Secondary | ICD-10-CM | POA: Diagnosis not present

## 2019-07-14 NOTE — Progress Notes (Signed)
Virtual Visit via Video Note  I connected with Janet Pierce  on 07/14/19 at  3:20 PM EDT by a video enabled telemedicine application and verified that I am speaking with the correct person using two identifiers.  Location patient: home Location provider:work or home office Persons participating in the virtual visit: patient, provider  I discussed the limitations of evaluation and management by telemedicine and the availability of in person appointments. The patient expressed understanding and agreed to proceed.   HPI:  Acute visit for  -started 3 days ago -symptoms include ear ache R - ear feels full, feeling tired, heavy full feeling in her head - reports is not pain or a headache -she gets some allergy symptoms when she is outside a lot, has been outside a lot -thinks the cough is from the nicorette gum as only happens when chews the gum, reports quit smoking last year but still chews the gum -denies fevers, SOB, body aches, NVD -had both covid shots completed the end of february -had cataract surgery in the beginning of march  -taking 2000 IU of vit D3 daily, but does not always remember to take -she wonders if this could be her vit D -denies any sick contacts in the last 2 weeks  -reports she feels much better after drinking some water  ROS: See pertinent positives and negatives per HPI.  Past Medical History:  Diagnosis Date  . Arthritis   . Glaucoma   . Hyperlipidemia   . Hypertension   . Retinal detachment    left eye prosthesis  . Scoliosis     Past Surgical History:  Procedure Laterality Date  . EYE SURGERY Right    glucoma  . LIPOMA EXCISION    . RETINAL DETACHMENT SURGERY    . TONSILLECTOMY      Family History  Problem Relation Age of Onset  . Lung cancer Mother   . Hypertension Mother   . Bone cancer Mother   . Diabetes Maternal Grandmother   . Colon cancer Neg Hx     SOCIAL HX: see hpi   Current Outpatient Medications:  .  amLODipine (NORVASC) 10 MG  tablet, TAKE 1 TABLET DAILY BEFORE BREAKFAST, Disp: 90 tablet, Rfl: 3 .  atorvastatin (LIPITOR) 20 MG tablet, TAKE 1 TABLET DAILY, Disp: 90 tablet, Rfl: 1 .  bimatoprost (LUMIGAN) 0.01 % SOLN, Place 1 drop into both eyes at bedtime., Disp: , Rfl:  .  cetirizine (ZYRTEC) 10 MG tablet, Take 1 tablet (10 mg total) by mouth daily., Disp: 30 tablet, Rfl: 6 .  dorzolamide (TRUSOPT) 2 % ophthalmic solution, INSTILL 1 DROP INTO BOTH EYES BID, Disp: , Rfl: 1 .  Multiple Vitamins-Minerals (MULTIVITAMIN GUMMIES WOMENS) CHEW, Chew 2 Doses by mouth every morning., Disp: , Rfl:  .  naproxen sodium (ALEVE) 220 MG tablet, Take 220 mg by mouth daily as needed., Disp: , Rfl:  .  timolol (TIMOPTIC) 0.5 % ophthalmic solution, Place 1 drop into the right eye 2 (two) times daily. , Disp: , Rfl:  .  tiZANidine (ZANAFLEX) 4 MG tablet, Take 1 tablet (4 mg total) by mouth every 8 (eight) hours as needed for muscle spasms., Disp: 30 tablet, Rfl: 0 .  traMADol (ULTRAM) 50 MG tablet, Take 1-2 tablets (50-100 mg total) by mouth 3 (three) times daily as needed., Disp: 30 tablet, Rfl: 2 .  Fluocinolone Acetonide 0.01 % OIL, Place 5 drops in ear(s) 2 (two) times daily. (Patient not taking: Reported on 07/14/2019), Disp: 20 mL, Rfl: 0 .  pantoprazole (PROTONIX) 40 MG tablet, Take 1 tablet (40 mg total) by mouth 2 (two) times daily. (Patient not taking: Reported on 07/14/2019), Disp: 60 tablet, Rfl: 0 .  sucralfate (CARAFATE) 1 g tablet, Take 1 tablet (1 g total) by mouth 4 (four) times daily -  with meals and at bedtime. (Patient not taking: Reported on 07/14/2019), Disp: 360 tablet, Rfl: 0  EXAM:  VITALS per patient if applicable:Denies fever, BP 148/81, 142/83  GENERAL: alert, oriented, appears well and in no acute distress  HEENT: atraumatic, conjunttiva clear, no obvious abnormalities on inspection of external nose and ears  NECK: normal movements of the head and neck  LUNGS: on inspection no signs of respiratory distress,  breathing rate appears normal, no obvious gross SOB, gasping or wheezing  CV: no obvious cyanosis  MS: moves all visible extremities without noticeable abnormality  PSYCH/NEURO: pleasant and cooperative, no obvious depression or anxiety, speech and thought processing grossly intact  ASSESSMENT AND PLAN:  Discussed the following assessment and plan:  Ear ache  Allergy, initial encounter  HYPERTENSION, BENIGN ESSENTIAL  -we discussed possible serious and likely etiologies, options for evaluation and workup, limitations of telemedicine visit vs in person visit, treatment, treatment risks and precautions. Pt prefers to treat via telemedicine empirically rather then risking or undertaking an in person visit at this moment. Discussed potential causes of her various complaints; query seasonal allergies with possible ETD and elevated BP. She opted to try antihistamine, lower sodium in diet, cutting back on the nicotine gum, monitoring BP, staying hydrated and follow up with PCP as scheduled to see how the BP is doing, labs and follow up if any persistent symptoms. Patient agrees to seek prompt in person care if worsening, new symptoms arise, or if is not improving with treatment.   I discussed the assessment and treatment plan with the patient. The patient was provided an opportunity to ask questions and all were answered. The patient agreed with the plan and demonstrated an understanding of the instructions.   The patient was advised to call back or seek an in-person evaluation if the symptoms worsen or if the condition fails to improve as anticipated.   Lucretia Kern, DO

## 2019-07-26 ENCOUNTER — Other Ambulatory Visit: Payer: Self-pay

## 2019-07-26 ENCOUNTER — Telehealth (INDEPENDENT_AMBULATORY_CARE_PROVIDER_SITE_OTHER): Payer: Medicare Other | Admitting: Family Medicine

## 2019-07-26 ENCOUNTER — Encounter: Payer: Self-pay | Admitting: Family Medicine

## 2019-07-26 VITALS — BP 136/73 | HR 90

## 2019-07-26 DIAGNOSIS — E785 Hyperlipidemia, unspecified: Secondary | ICD-10-CM | POA: Diagnosis not present

## 2019-07-26 DIAGNOSIS — E559 Vitamin D deficiency, unspecified: Secondary | ICD-10-CM | POA: Diagnosis not present

## 2019-07-26 DIAGNOSIS — I10 Essential (primary) hypertension: Secondary | ICD-10-CM

## 2019-07-26 NOTE — Progress Notes (Signed)
Virtual Visit via Video   I connected with patient on 07/26/19 at  9:30 AM EDT by a video enabled telemedicine application and verified that I am speaking with the correct person using two identifiers.  Location patient: Home Location provider: Acupuncturist, Office Persons participating in the virtual visit: Patient, Provider, [place] (Jess B)  I discussed the limitations of evaluation and management by telemedicine and the availability of in person appointments. The patient expressed understanding and agreed to proceed.  Subjective:   HPI:   HTN- chronic problem, on Amlodipine 10mg  daily.  During 3/25 Telehealth visit w/ Dr Maudie Mercury, BP was 148/81.  Today is 136/73.  Denies CP, SOB, HAs, edema.  Hyperlipidemia- chronic problem, on Lipitor 20mg  daily.  Denies abd pain, N/V  Vit D deficiency- it has been a year since this has been checked.  Pt is asking for repeat labs  ROS:   See pertinent positives and negatives per HPI.  Patient Active Problem List   Diagnosis Date Noted  . Vitamin D deficiency 07/08/2018  . Benign paroxysmal positional vertigo 05/30/2015  . Bunion, right foot 06/01/2014  . Tobacco abuse 11/25/2013  . Dysphagia 05/19/2013  . Special screening for malignant neoplasms, colon 05/19/2013  . GERD (gastroesophageal reflux disease) 04/04/2013  . Allergic rhinitis 04/04/2013  . General medical examination 09/11/2010  . PREMATURE VENTRICULAR CONTRACTIONS 11/06/2009  . SUPRAVENTRICULAR TACHYCARDIA 10/05/2009  . LEUKOCYTOPENIA UNSPECIFIED 09/10/2009  . DEPRESSIVE DISORDER 09/06/2009  . ECZEMA 09/06/2009  . PALPITATIONS, OCCASIONAL 06/08/2009  . SHOULDER PAIN, LEFT 05/24/2009  . BACK PAIN 05/24/2009  . SCOLIOSIS 05/24/2009  . LIPOMA 02/02/2008  . Hyperlipidemia 02/02/2008  . HYPERTENSION, BENIGN ESSENTIAL 02/02/2008    Social History   Tobacco Use  . Smoking status: Former Smoker    Packs/day: 0.25    Types: Cigarettes    Quit date: 11/23/2016    Years  since quitting: 2.6  . Smokeless tobacco: Never Used  . Tobacco comment: stopped by using nicorette gum. has completely stopped  Substance Use Topics  . Alcohol use: Yes    Alcohol/week: 0.0 standard drinks    Comment: occasionally    Current Outpatient Medications:  .  amLODipine (NORVASC) 10 MG tablet, TAKE 1 TABLET DAILY BEFORE BREAKFAST, Disp: 90 tablet, Rfl: 3 .  atorvastatin (LIPITOR) 20 MG tablet, TAKE 1 TABLET DAILY, Disp: 90 tablet, Rfl: 1 .  BESIVANCE 0.6 % SUSP, Place 1 drop into the right eye 3 (three) times daily., Disp: , Rfl:  .  cetirizine (ZYRTEC) 10 MG tablet, Take 1 tablet (10 mg total) by mouth daily., Disp: 30 tablet, Rfl: 6 .  dorzolamide (TRUSOPT) 2 % ophthalmic solution, INSTILL 1 DROP INTO BOTH EYES BID, Disp: , Rfl: 1 .  Fluocinolone Acetonide 0.01 % OIL, Place 5 drops in ear(s) 2 (two) times daily., Disp: 20 mL, Rfl: 0 .  Multiple Vitamins-Minerals (MULTIVITAMIN GUMMIES WOMENS) CHEW, Chew 2 Doses by mouth every morning., Disp: , Rfl:  .  naproxen sodium (ALEVE) 220 MG tablet, Take 220 mg by mouth daily as needed., Disp: , Rfl:  .  pantoprazole (PROTONIX) 40 MG tablet, Take 1 tablet (40 mg total) by mouth 2 (two) times daily., Disp: 60 tablet, Rfl: 0 .  timolol (TIMOPTIC) 0.5 % ophthalmic solution, Place 1 drop into the right eye 2 (two) times daily. , Disp: , Rfl:  .  tiZANidine (ZANAFLEX) 4 MG tablet, Take 1 tablet (4 mg total) by mouth every 8 (eight) hours as needed for muscle spasms., Disp:  30 tablet, Rfl: 0 .  traMADol (ULTRAM) 50 MG tablet, Take 1-2 tablets (50-100 mg total) by mouth 3 (three) times daily as needed., Disp: 30 tablet, Rfl: 2 .  sucralfate (CARAFATE) 1 g tablet, Take 1 tablet (1 g total) by mouth 4 (four) times daily -  with meals and at bedtime. (Patient not taking: Reported on 07/26/2019), Disp: 360 tablet, Rfl: 0  No Known Allergies  Objective:   BP 136/73 Comment: took BP meds about 8  Pulse 90  AAOx3, NAD NCAT, EOMI No obvious CN  deficits Coloring WNL Pt is able to speak clearly, coherently without shortness of breath or increased work of breathing.  Thought process is linear.  Mood is appropriate.   Assessment and Plan:   HTN- chronic problem.  BP adequate today and better than last visit.  Currently asymptomatic.  Check labs.  No anticipated med changes.  Hyperlipidemia- chronic problem.  Tolerating statin w/o difficulty.  Check labs.  Adjust meds prn   Vit D deficiency- ongoing issue.  Check labs and replete prn.   [name], MD 07/26/2019

## 2019-07-26 NOTE — Progress Notes (Signed)
I have discussed the procedure for the virtual visit with the patient who has given consent to proceed with assessment and treatment.   Dava Rensch L Oshen Wlodarczyk, CMA     

## 2019-08-09 ENCOUNTER — Other Ambulatory Visit: Payer: Self-pay | Admitting: Family Medicine

## 2019-09-16 ENCOUNTER — Ambulatory Visit
Admission: RE | Admit: 2019-09-16 | Discharge: 2019-09-16 | Disposition: A | Discharge status: Home / Self Care | Payer: Medicare Other | Source: Ambulatory Visit | Attending: Family Medicine | Admitting: Family Medicine

## 2019-09-16 ENCOUNTER — Other Ambulatory Visit: Payer: Self-pay

## 2019-09-16 DIAGNOSIS — Z780 Asymptomatic menopausal state: Secondary | ICD-10-CM

## 2019-09-16 DIAGNOSIS — Z1231 Encounter for screening mammogram for malignant neoplasm of breast: Secondary | ICD-10-CM

## 2019-09-21 ENCOUNTER — Ambulatory Visit: Payer: Medicare Other | Admitting: Family Medicine

## 2019-09-28 ENCOUNTER — Other Ambulatory Visit: Payer: Self-pay

## 2019-09-28 ENCOUNTER — Encounter: Payer: Self-pay | Admitting: Family Medicine

## 2019-09-28 ENCOUNTER — Ambulatory Visit: Payer: Medicare Other | Admitting: Family Medicine

## 2019-09-28 VITALS — BP 112/78 | HR 76 | Temp 97.9°F | Resp 16 | Ht 64.0 in | Wt 139.5 lb

## 2019-09-28 DIAGNOSIS — M810 Age-related osteoporosis without current pathological fracture: Secondary | ICD-10-CM | POA: Diagnosis not present

## 2019-09-28 NOTE — Patient Instructions (Addendum)
Schedule your physical at your convenience We're going to start the approval process for the Prolia I love the idea of exercise to build your bones! Add daily Calcium and Vit D Call with any questions or concerns Have a great summer!

## 2019-09-28 NOTE — Assessment & Plan Note (Signed)
Pt's L femur is officially in osteoporotic range.  After long discussion of options- Reclast, oral bisphosphonates, and Prolia- pt decided to try Prolia.  She likes the idea of not adding another pill and she already has GI issues.  Will attempt to get this certified w/ her insurance.  Pt expressed understanding and is in agreement w/ plan.

## 2019-09-28 NOTE — Progress Notes (Signed)
   Subjective:    Patient ID: Janet Pierce, female    DOB: [birth year], 69 y.o.   MRN: [id number]  HPI Osteoporosis- pt's L femur T score -2.5 places her in Osteoporotic range.  Pt plans to resume Silver Sneakers.  Would prefer not to take another pill if possible.  Has hx of GERD and GI issues.   Review of Systems For ROS see HPI   This visit occurred during the SARS-CoV-2 public health emergency.  Safety protocols were in place, including screening questions prior to the visit, additional usage of staff PPE, and extensive cleaning of exam room while observing appropriate contact time as indicated for disinfecting solutions.       Objective:   Physical Exam Vitals reviewed.  Constitutional:      General: She is not in acute distress.    Appearance: Normal appearance. She is not ill-appearing.  HENT:     Head: Normocephalic and atraumatic.  Neurological:     General: No focal deficit present.     Mental Status: She is alert and oriented to person, place, and time.  Psychiatric:        Mood and Affect: Mood normal.        Behavior: Behavior normal.        Thought Content: Thought content normal.           Assessment & Plan:

## 2019-11-10 ENCOUNTER — Other Ambulatory Visit: Payer: Self-pay

## 2019-11-10 ENCOUNTER — Encounter: Payer: Self-pay | Admitting: Family Medicine

## 2019-11-10 ENCOUNTER — Ambulatory Visit: Payer: Medicare Other | Admitting: Family Medicine

## 2019-11-10 VITALS — BP 123/82 | HR 80 | Temp 98.2°F | Resp 16 | Ht 64.0 in | Wt 138.1 lb

## 2019-11-10 DIAGNOSIS — E785 Hyperlipidemia, unspecified: Secondary | ICD-10-CM

## 2019-11-10 DIAGNOSIS — R5383 Other fatigue: Secondary | ICD-10-CM | POA: Diagnosis not present

## 2019-11-10 DIAGNOSIS — H9202 Otalgia, left ear: Secondary | ICD-10-CM

## 2019-11-10 DIAGNOSIS — I10 Essential (primary) hypertension: Secondary | ICD-10-CM

## 2019-11-10 DIAGNOSIS — M25511 Pain in right shoulder: Secondary | ICD-10-CM

## 2019-11-10 NOTE — Patient Instructions (Signed)
Schedule your complete physical in 3-4 months We'll notify you of your lab results and make any changes if needed Use a heating pad/tylenol/ibuprofen as needed for shoulder and pectoralis pain.  No more painting! The bump in your ear should scab and continue to heal Make sure you are drinking plenty of fluids Call with any questions or concerns Have a great summer!

## 2019-11-10 NOTE — Assessment & Plan Note (Signed)
Chronic problem.  Tolerating statin w/o difficulty.  Check labs.  Adjust meds prn  

## 2019-11-10 NOTE — Progress Notes (Signed)
   Subjective:    Patient ID: Janet Pierce, female    DOB: 1950/12/10, 69 y.o.   MRN: [id number]  HPI Fatigue- 'I just been feeling real tired'.  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  'I feel like I'm always dehydrated.  Pt reports she is 'trying to' drink a lot of fluids.  Denies CP, SOB.  Pt admits to higher levels of stress recently.  R shoulder pain/chest pain- pt has been painting regularly x2 days and last night developed shoulder and pectoralis pain.  She took 2 Aleve w/ some improvement.  L ear pain- 'I feel a bump in there'  No drainage.  HTN- chronic problem, on Amlodipine 10mg  daily w/ adequate control.  Denies CP, SOB, HAs, visual changes, edema.  Hyperlipidemia- chronic problem, on Lipitor 20mg  daily.  Denies abd pain, N/V.   Review of Systems For ROS see HPI   This visit occurred during the SARS-CoV-2 public health emergency.  Safety protocols were in place, including screening questions prior to the visit, additional usage of staff PPE, and extensive cleaning of exam room while observing appropriate contact time as indicated for disinfecting solutions.       Objective:   Physical Exam Vitals reviewed.  Constitutional:      General: She is not in acute distress.    Appearance: Normal appearance. She is well-developed.  HENT:     Head: Normocephalic and atraumatic.  Eyes:     Conjunctiva/sclera: Conjunctivae normal.     Pupils: Pupils are equal, round, and reactive to light.  Neck:     Thyroid: No thyromegaly.  Cardiovascular:     Rate and Rhythm: Normal rate and regular rhythm.     Heart sounds: Normal heart sounds. No murmur heard.   Pulmonary:     Effort: Pulmonary effort is normal. No respiratory distress.     Breath sounds: Normal breath sounds.  Abdominal:     General: There is no distension.     Palpations: Abdomen is soft.     Tenderness: There is no abdominal tenderness.  Musculoskeletal:        General: Tenderness (TTP  over insertion of pecs bilaterally) present.     Cervical back: Normal range of motion and neck supple.     Comments: Pain w/ abduction of R shoulder, forward flexion, and external rotation  Lymphadenopathy:     Cervical: No cervical adenopathy.  Skin:    General: Skin is warm and dry.  Neurological:     Mental Status: She is alert and oriented to person, place, and time.  Psychiatric:        Behavior: Behavior normal.           Assessment & Plan:  L ear pain- new.  Pt has small scab at opening to [facility].  No evidence of infxn.  Reassurance provided that this will heal.  R shoulder/pec pain- new.  This is from 2 straight days of painting.  Reviewed supportive care.  Pt expressed understanding and is in agreement w/ plan.   Fatigue- likely multifactorial.  High levels of stress, increased physical activity recently.  Check labs to r/o underlying metabolic cause.  Will follow.

## 2019-11-10 NOTE — Assessment & Plan Note (Signed)
Chronic problem.  Well controlled.  Asymptomatic w/ exception of fatigue- but suspect this is multifactorial.  Check labs.  No anticipated med changes.  Will follow.

## 2019-11-11 ENCOUNTER — Encounter: Payer: Self-pay | Admitting: General Practice

## 2019-11-11 LAB — CBC WITH DIFFERENTIAL/PLATELET
Basophils Absolute: 0 10*3/uL (ref 0.0–0.1)
Basophils Relative: 1.1 % (ref 0.0–3.0)
Eosinophils Absolute: 0.2 10*3/uL (ref 0.0–0.7)
Eosinophils Relative: 3.5 % (ref 0.0–5.0)
HCT: 37.2 % (ref 36.0–46.0)
Hemoglobin: 12.6 g/dL (ref 12.0–15.0)
Lymphocytes Relative: 42.8 % (ref 12.0–46.0)
Lymphs Abs: 2 10*3/uL (ref 0.7–4.0)
MCHC: 33.9 g/dL (ref 30.0–36.0)
MCV: 84 fl (ref 78.0–100.0)
Monocytes Absolute: 0.5 10*3/uL (ref 0.1–1.0)
Monocytes Relative: 10.2 % (ref 3.0–12.0)
Neutro Abs: 1.9 10*3/uL (ref 1.4–7.7)
Neutrophils Relative %: 42.4 % — ABNORMAL LOW (ref 43.0–77.0)
Platelets: 282 10*3/uL (ref 150.0–400.0)
RBC: 4.43 Mil/uL (ref 3.87–5.11)
RDW: 13.1 % (ref 11.5–15.5)
WBC: 4.6 10*3/uL (ref 4.0–10.5)

## 2019-11-11 LAB — HEPATIC FUNCTION PANEL
ALT: 17 U/L (ref 0–35)
AST: 17 U/L (ref 0–37)
Albumin: 4.4 g/dL (ref 3.5–5.2)
Alkaline Phosphatase: 64 U/L (ref 39–117)
Bilirubin, Direct: 0.1 mg/dL (ref 0.0–0.3)
Total Bilirubin: 0.5 mg/dL (ref 0.2–1.2)
Total Protein: 7.2 g/dL (ref 6.0–8.3)

## 2019-11-11 LAB — LIPID PANEL
Cholesterol: 223 mg/dL — ABNORMAL HIGH (ref 0–200)
HDL: 52.1 mg/dL (ref >39.00)
LDL Cholesterol: 142 mg/dL — ABNORMAL HIGH (ref 0–99)
NonHDL: 170.79
Total CHOL/HDL Ratio: 4
Triglycerides: 143 mg/dL (ref 0.0–149.0)
VLDL: 28.6 mg/dL (ref 0.0–40.0)

## 2019-11-11 LAB — B12 AND FOLATE PANEL
Folate: 16.2 ng/mL (ref >5.9)
Vitamin B-12: 516 pg/mL (ref 211–911)

## 2019-11-11 LAB — BASIC METABOLIC PANEL
BUN: 12 mg/dL (ref 6–23)
CO2: 32 mEq/L (ref 19–32)
Calcium: 9.7 mg/dL (ref 8.4–10.5)
Chloride: 101 mEq/L (ref 96–112)
Creatinine, Ser: 0.69 mg/dL (ref 0.40–1.20)
GFR: 102.18 mL/min (ref >60.00)
Glucose, Bld: 86 mg/dL (ref 70–99)
Potassium: 4 mEq/L (ref 3.5–5.1)
Sodium: 139 mEq/L (ref 135–145)

## 2019-11-11 LAB — TSH: TSH: 1.33 u[IU]/mL (ref 0.35–4.50)

## 2019-11-11 LAB — VITAMIN D 25 HYDROXY (VIT D DEFICIENCY, FRACTURES): VITD: 31.01 ng/mL (ref 30.00–100.00)

## 2019-12-23 ENCOUNTER — Other Ambulatory Visit: Payer: Self-pay | Admitting: Family Medicine

## 2019-12-27 ENCOUNTER — Encounter: Payer: Self-pay | Admitting: Family Medicine

## 2019-12-27 ENCOUNTER — Other Ambulatory Visit: Payer: Self-pay

## 2019-12-27 ENCOUNTER — Telehealth (INDEPENDENT_AMBULATORY_CARE_PROVIDER_SITE_OTHER): Payer: Medicare Other | Admitting: Family Medicine

## 2019-12-27 VITALS — BP 128/73 | HR 93

## 2019-12-27 DIAGNOSIS — R5383 Other fatigue: Secondary | ICD-10-CM | POA: Diagnosis not present

## 2019-12-27 DIAGNOSIS — F17203 Nicotine dependence unspecified, with withdrawal: Secondary | ICD-10-CM | POA: Diagnosis not present

## 2019-12-27 NOTE — Progress Notes (Signed)
Virtual Visit via Video   I connected with patient on 12/27/19 at 11:00 AM EDT by a video enabled telemedicine application and verified that I am speaking with the correct person using two identifiers.  Location patient: Home Location provider: Acupuncturist, Office Persons participating in the virtual visit: Patient, Provider, [place] (Jess B)  I discussed the limitations of evaluation and management by telemedicine and the availability of in person appointments. The patient expressed understanding and agreed to proceed.  Subjective:   HPI:   Fatigue- Pt reports last week 'I felt so drained' and 'it felt like my head wasn't on my shoulders'.  Stopped smoking abruptly last week at the same time her sxs started.  Denies vertigo.  No fever, chills, cough, SOB.  'I have like slight double vision', waiting on call from eye doctor b/c she has been off Timolol.  Pt reports she feels somewhat better today.  Reports she has been working on drinking enough fluids b/c she also felt dehydrated last week.  Pt reports joint pains are better this AM since stopping the cigarettes.    ROS:   See pertinent positives and negatives per HPI.  Patient Active Problem List   Diagnosis Date Noted  . Age-related osteoporosis without current pathological fracture 09/28/2019  . Vitamin D deficiency 07/08/2018  . Benign paroxysmal positional vertigo 05/30/2015  . Bunion, right foot 06/01/2014  . Tobacco abuse 11/25/2013  . Dysphagia 05/19/2013  . Special screening for malignant neoplasms, colon 05/19/2013  . GERD (gastroesophageal reflux disease) 04/04/2013  . Allergic rhinitis 04/04/2013  . General medical examination 09/11/2010  . PREMATURE VENTRICULAR CONTRACTIONS 11/06/2009  . SUPRAVENTRICULAR TACHYCARDIA 10/05/2009  . LEUKOCYTOPENIA UNSPECIFIED 09/10/2009  . DEPRESSIVE DISORDER 09/06/2009  . ECZEMA 09/06/2009  . PALPITATIONS, OCCASIONAL 06/08/2009  . SHOULDER PAIN, LEFT 05/24/2009  . BACK PAIN  05/24/2009  . SCOLIOSIS 05/24/2009  . LIPOMA 02/02/2008  . Hyperlipidemia 02/02/2008  . HYPERTENSION, BENIGN ESSENTIAL 02/02/2008    Social History   Tobacco Use  . Smoking status: Former Smoker    Packs/day: 0.25    Types: Cigarettes    Quit date: 11/23/2016    Years since quitting: 3.0  . Smokeless tobacco: Never Used  . Tobacco comment: stopped by using nicorette gum. has completely stopped  Substance Use Topics  . Alcohol use: Yes    Alcohol/week: 0.0 standard drinks    Comment: occasionally    Current Outpatient Medications:  .  amLODipine (NORVASC) 10 MG tablet, TAKE 1 TABLET DAILY BEFORE BREAKFAST, Disp: 90 tablet, Rfl: 3 .  atorvastatin (LIPITOR) 20 MG tablet, TAKE 1 TABLET DAILY, Disp: 90 tablet, Rfl: 3 .  BESIVANCE 0.6 % SUSP, Place 1 drop into the right eye 3 (three) times daily., Disp: , Rfl:  .  cetirizine (ZYRTEC) 10 MG tablet, Take 1 tablet (10 mg total) by mouth daily., Disp: 30 tablet, Rfl: 6 .  dorzolamide (TRUSOPT) 2 % ophthalmic solution, INSTILL 1 DROP INTO BOTH EYES BID, Disp: , Rfl: 1 .  Fluocinolone Acetonide 0.01 % OIL, Place 5 drops in ear(s) 2 (two) times daily., Disp: 20 mL, Rfl: 0 .  Multiple Vitamins-Minerals (MULTIVITAMIN GUMMIES WOMENS) CHEW, Chew 2 Doses by mouth every morning., Disp: , Rfl:  .  naproxen sodium (ALEVE) 220 MG tablet, Take 220 mg by mouth daily as needed., Disp: , Rfl:  .  pantoprazole (PROTONIX) 40 MG tablet, Take 1 tablet (40 mg total) by mouth 2 (two) times daily., Disp: 60 tablet, Rfl: 0 .  sucralfate (  CARAFATE) 1 g tablet, Take 1 tablet (1 g total) by mouth 4 (four) times daily -  with meals and at bedtime., Disp: 360 tablet, Rfl: 0 .  timolol (TIMOPTIC) 0.5 % ophthalmic solution, Place 1 drop into the right eye 2 (two) times daily. , Disp: , Rfl:  .  tiZANidine (ZANAFLEX) 4 MG tablet, Take 1 tablet (4 mg total) by mouth every 8 (eight) hours as needed for muscle spasms., Disp: 30 tablet, Rfl: 0 .  traMADol (ULTRAM) 50 MG tablet,  Take 1-2 tablets (50-100 mg total) by mouth 3 (three) times daily as needed., Disp: 30 tablet, Rfl: 2  No Known Allergies  Objective:   BP 128/73   Pulse 93  AAOx3, NAD NCAT, EOMI No obvious CN deficits Coloring WNL Pt is able to speak clearly, coherently without shortness of breath or increased work of breathing.  Thought process is linear.  Mood is appropriate.   Assessment and Plan:   Nicotine withdrawal- pt abruptly went from 1/2 ppd smoker to no cigarettes on Wednesday.  She had no nicotine on Wednesday or Thursday which are the days she felt the worst.  She started chewing nicotine gum on Friday and since then, sxs have been improving.  She reports actually feeling better in regards to joint pain.  Discussed the appropriate way to wean her nicotine content.  Fatigue- ongoing issue for pt.  Recent labs WNL.  She reports today she is actually feeling better.  Discussed that this is likely a combo of her normal stress, nicotine withdrawal, and seasonal allergies (which are not currently being treated).  Encouraged her to restart Claritin daily.  Will follow.   [name], MD 12/27/2019

## 2019-12-27 NOTE — Progress Notes (Signed)
I have discussed the procedure for the virtual visit with the patient who has given consent to proceed with assessment and treatment.   Pt states symptoms started Wednesday of last week, which is also the day she quit smoking cold [place]. Began Nicorette on Thursday (uses 3-4 times a day). Pt used Claritin once on Friday. States she feels "out of it" and dizzy but admits she only drinks water when she begins to feel this way.   Davis Gourd, CMA

## 2020-02-01 ENCOUNTER — Telehealth: Payer: Self-pay

## 2020-02-01 NOTE — Telephone Encounter (Signed)
I have left patient a vm that I have changed her virtual CPE to a in office visit on 10/21 at 1:30pm.

## 2020-02-09 ENCOUNTER — Ambulatory Visit (INDEPENDENT_AMBULATORY_CARE_PROVIDER_SITE_OTHER): Payer: Medicare Other | Admitting: Family Medicine

## 2020-02-09 ENCOUNTER — Encounter: Payer: Self-pay | Admitting: Family Medicine

## 2020-02-09 ENCOUNTER — Other Ambulatory Visit: Payer: Self-pay

## 2020-02-09 VITALS — BP 134/70 | HR 89 | Temp 98.1°F | Resp 20 | Ht 63.0 in | Wt 139.2 lb

## 2020-02-09 DIAGNOSIS — Z0000 Encounter for general adult medical examination without abnormal findings: Secondary | ICD-10-CM | POA: Diagnosis not present

## 2020-02-09 DIAGNOSIS — E559 Vitamin D deficiency, unspecified: Secondary | ICD-10-CM

## 2020-02-09 DIAGNOSIS — I10 Essential (primary) hypertension: Secondary | ICD-10-CM | POA: Diagnosis not present

## 2020-02-09 LAB — BASIC METABOLIC PANEL
BUN: 12 mg/dL (ref 6–23)
CO2: 31 mEq/L (ref 19–32)
Calcium: 9.2 mg/dL (ref 8.4–10.5)
Chloride: 101 mEq/L (ref 96–112)
Creatinine, Ser: 0.68 mg/dL (ref 0.40–1.20)
GFR: 89.2 mL/min (ref >60.00)
Glucose, Bld: 85 mg/dL (ref 70–99)
Potassium: 3.9 mEq/L (ref 3.5–5.1)
Sodium: 139 mEq/L (ref 135–145)

## 2020-02-09 LAB — LIPID PANEL
Cholesterol: 171 mg/dL (ref 0–200)
HDL: 53.8 mg/dL (ref >39.00)
LDL Cholesterol: 101 mg/dL — ABNORMAL HIGH (ref 0–99)
NonHDL: 117.09
Total CHOL/HDL Ratio: 3
Triglycerides: 81 mg/dL (ref 0.0–149.0)
VLDL: 16.2 mg/dL (ref 0.0–40.0)

## 2020-02-09 LAB — CBC WITH DIFFERENTIAL/PLATELET
Basophils Absolute: 0 10*3/uL (ref 0.0–0.1)
Basophils Relative: 0.8 % (ref 0.0–3.0)
Eosinophils Absolute: 0.1 10*3/uL (ref 0.0–0.7)
Eosinophils Relative: 3 % (ref 0.0–5.0)
HCT: 35.4 % — ABNORMAL LOW (ref 36.0–46.0)
Hemoglobin: 12 g/dL (ref 12.0–15.0)
Lymphocytes Relative: 42.1 % (ref 12.0–46.0)
Lymphs Abs: 1.8 10*3/uL (ref 0.7–4.0)
MCHC: 34 g/dL (ref 30.0–36.0)
MCV: 82 fl (ref 78.0–100.0)
Monocytes Absolute: 0.4 10*3/uL (ref 0.1–1.0)
Monocytes Relative: 9.6 % (ref 3.0–12.0)
Neutro Abs: 1.9 10*3/uL (ref 1.4–7.7)
Neutrophils Relative %: 44.5 % (ref 43.0–77.0)
Platelets: 274 10*3/uL (ref 150.0–400.0)
RBC: 4.32 Mil/uL (ref 3.87–5.11)
RDW: 13.2 % (ref 11.5–15.5)
WBC: 4.3 10*3/uL (ref 4.0–10.5)

## 2020-02-09 LAB — HEPATIC FUNCTION PANEL
ALT: 19 U/L (ref 0–35)
AST: 18 U/L (ref 0–37)
Albumin: 4.3 g/dL (ref 3.5–5.2)
Alkaline Phosphatase: 72 U/L (ref 39–117)
Bilirubin, Direct: 0.1 mg/dL (ref 0.0–0.3)
Total Bilirubin: 0.6 mg/dL (ref 0.2–1.2)
Total Protein: 6.9 g/dL (ref 6.0–8.3)

## 2020-02-09 LAB — VITAMIN D 25 HYDROXY (VIT D DEFICIENCY, FRACTURES): VITD: 32.69 ng/mL (ref 30.00–100.00)

## 2020-02-09 LAB — TSH: TSH: 1.98 u[IU]/mL (ref 0.35–4.50)

## 2020-02-09 NOTE — Progress Notes (Signed)
   Subjective:    Patient ID: Janet Pierce, female    DOB: 1950/10/04, 69 y.o.   MRN: [id number]  HPI CPE- UTD on mammo, DEXA.  Due for cologuard- has at home.  UTD on COVID shots.  Declines flu and pneumonia.    Reviewed past medical, surgical, family and social histories.   Patient Care Team    Relationship Specialty Notifications Start End  Midge Minium, MD PCP - General   04/03/10   [place]  04/16/17   Magnus Sinning, MD Consulting Physician Physical Medicine and Rehabilitation  04/27/19   Leandrew Koyanagi, MD Consulting Physician Orthopedic Surgery  04/27/19   Corey Harold, MD Consulting Physician Ophthalmology  04/27/19     Health Maintenance  Topic Date Due  . Fecal DNA (Cologuard)  Never done  . TETANUS/TDAP  04/26/2020 (Originally 03/23/1970)  . PNA vac Low Risk Adult (1 of 2 - PCV13) 04/26/2020 (Originally 03/23/2016)  . INFLUENZA VACCINE  07/19/2020 (Originally 11/20/2019)  . Hepatitis C Screening  09/27/2020 (Originally [birth year])  . MAMMOGRAM  09/15/2021  . DEXA SCAN  Completed  . COVID-19 Vaccine  Completed     Review of Systems Patient reports no hearing changes, adenopathy,fever, weight change,  persistant/recurrent hoarseness , swallowing issues, chest pain, palpitations, edema, persistant/recurrent cough, hemoptysis, dyspnea (rest/exertional/paroxysmal nocturnal), gastrointestinal bleeding (melena, rectal bleeding), abdominal pain, significant heartburn, bowel changes, GU symptoms (dysuria, hematuria, incontinence), Gyn symptoms (abnormal  bleeding, pain),  syncope, focal weakness, memory loss, numbness & tingling, skin/hair/nail changes, abnormal bruising or bleeding, anxiety, or depression.   + vision changes- following w/ eye doctor  This visit occurred during the SARS-CoV-2 public health emergency.  Safety protocols were in place, including screening questions prior to the visit, additional usage of staff PPE, and extensive cleaning  of exam room while observing appropriate contact time as indicated for disinfecting solutions.       Objective:   Physical Exam General Appearance:    Alert, cooperative, no distress, appears stated age  Head:    Normocephalic, without obvious abnormality, atraumatic  Eyes:    PERRL, conjunctiva/corneas clear, EOM's intact both eyes  Ears:    Normal TM's and external ear canals, both ears  Nose:   Deferred due to COVID  Throat:   Neck:   Supple, symmetrical, trachea midline, no adenopathy;    Thyroid: no enlargement/tenderness/nodules  Back:     Symmetric, no curvature, ROM normal, no CVA tenderness  Lungs:     Clear to auscultation bilaterally, respirations unlabored  Chest Wall:    No tenderness or deformity   Heart:    Regular rate and rhythm, S1 and S2 normal, no murmur, rub   or gallop  Breast Exam:    Deferred to GYN  Abdomen:     Soft, non-tender, bowel sounds active all four quadrants,    no masses, no organomegaly  Genitalia:    Deferred to GYN  Rectal:    Extremities:   Extremities normal, atraumatic, no cyanosis or edema  Pulses:   2+ and symmetric all extremities  Skin:   Skin color, texture, turgor normal, no rashes or lesions  Lymph nodes:   Cervical, supraclavicular, and axillary nodes normal  Neurologic:   CNII-XII intact, normal strength, sensation and reflexes    throughout          Assessment & Plan:

## 2020-02-09 NOTE — Assessment & Plan Note (Signed)
Pt's PE WNL and unchanged from previous.  UTD on mammo, DEXA.  Stressed need to complete cologuard.  UTD on COVID.  Refuses flu and pneumonia.  Check labs.  Anticipatory guidance provided.

## 2020-02-09 NOTE — Assessment & Plan Note (Signed)
Check labs and replete prn. 

## 2020-02-09 NOTE — Patient Instructions (Addendum)
Follow up in 6 months to recheck BP and cholesterol We'll notify you of your lab results and make any changes if needed Continue to work on healthy diet and regular exercise- you're doing great! Complete the cologuard and return as directed Call with any questions or concerns Stay Safe!  Stay Healthy!

## 2020-02-09 NOTE — Assessment & Plan Note (Signed)
Chronic problem.  Adequate control.  Currently asymptomatic.  Check labs.  Will follow.

## 2020-03-12 ENCOUNTER — Other Ambulatory Visit: Payer: Medicare Other

## 2020-03-12 ENCOUNTER — Ambulatory Visit: Payer: Medicare Other | Attending: Internal Medicine

## 2020-03-12 DIAGNOSIS — Z23 Encounter for immunization: Secondary | ICD-10-CM

## 2020-03-12 NOTE — Progress Notes (Signed)
   Covid-19 Vaccination Clinic  Name:  Janet Pierce    MRN: [id number] DOB: 10-10-1950  03/12/2020  [name] was observed post Covid-19 immunization for 15 minutes without incident. She was provided with Vaccine Information Sheet and instruction to access the V-Safe system.   [name] was instructed to call 911 with any severe reactions post vaccine: Marland Kitchen Difficulty breathing  . Swelling of face and throat  . A fast heartbeat  . A bad rash all over body  . Dizziness and weakness   Immunizations Administered    No immunizations on file.

## 2020-04-09 ENCOUNTER — Telehealth: Payer: Self-pay | Admitting: Family Medicine

## 2020-04-09 ENCOUNTER — Telehealth: Payer: Self-pay

## 2020-04-09 ENCOUNTER — Other Ambulatory Visit: Payer: Self-pay

## 2020-04-09 DIAGNOSIS — K219 Gastro-esophageal reflux disease without esophagitis: Secondary | ICD-10-CM

## 2020-04-09 MED ORDER — PANTOPRAZOLE SODIUM 40 MG PO TBEC: 40.0000 mg | DELAYED_RELEASE_TABLET | Freq: Two times a day (BID) | ORAL | 0 refills | Status: DC

## 2020-04-09 NOTE — Telephone Encounter (Signed)
Called and spoke with patient regarding her refill request. [facility].

## 2020-04-09 NOTE — Telephone Encounter (Signed)
Pt called in asking for a new script to [facility].    Pt can be reached at the home #

## 2020-04-09 NOTE — Telephone Encounter (Signed)
Addressed pt concerns regarding refill. Refill sent to [facility]. No further concerns at this time.

## 2020-04-10 ENCOUNTER — Telehealth: Payer: Self-pay | Admitting: Family Medicine

## 2020-04-10 NOTE — Telephone Encounter (Signed)
Left message for patient to schedule Annual Wellness Visit.  Please schedule with Nurse Health Advisor Martha Stanley, RN at [facility]  

## 2020-04-16 ENCOUNTER — Telehealth: Payer: Self-pay | Admitting: Physical Medicine and Rehabilitation

## 2020-04-16 NOTE — Telephone Encounter (Signed)
Patient called. She would like an appointment with Dr. Alvester Morin. Her call back number is [phone]

## 2020-04-16 NOTE — Telephone Encounter (Signed)
Left L4-5, L5-S1 Facet injection on 07/04/19. Ok to repeat if helped, same problem/side, and no new injury?

## 2020-04-19 NOTE — Telephone Encounter (Signed)
Okay to repeat if it helped a lot.  If it did not help a lot or last a long time then at that point I think we should OV her

## 2020-04-19 NOTE — Telephone Encounter (Signed)
°

## 2020-04-19 NOTE — Telephone Encounter (Signed)
Patient reports greater than 50% relief following last injection. Scheduled for repeat on 1/18 with driver. Is auth needed?

## 2020-04-23 NOTE — Telephone Encounter (Signed)
Pt not req Auth#. 

## 2020-05-04 ENCOUNTER — Telehealth (INDEPENDENT_AMBULATORY_CARE_PROVIDER_SITE_OTHER): Payer: Medicare Other | Admitting: Physician Assistant

## 2020-05-04 ENCOUNTER — Other Ambulatory Visit: Payer: Self-pay

## 2020-05-04 ENCOUNTER — Encounter: Payer: Self-pay | Admitting: Physician Assistant

## 2020-05-04 DIAGNOSIS — J309 Allergic rhinitis, unspecified: Secondary | ICD-10-CM | POA: Diagnosis not present

## 2020-05-04 MED ORDER — LEVOCETIRIZINE DIHYDROCHLORIDE 5 MG PO TABS: 5.0000 mg | ORAL_TABLET | Freq: Every evening | ORAL | 0 refills | Status: DC

## 2020-05-04 NOTE — Progress Notes (Signed)
Virtual Visit via Video   I connected with patient on 05/04/20 at  9:00 AM EST by a video enabled telemedicine application and verified that I am speaking with the correct person using two identifiers.  Location patient: Home Location provider: [facility], Office Persons participating in the virtual visit: Patient, Provider, [place] (Patina Moore)  I discussed the limitations of evaluation and management by telemedicine and the availability of in person appointments. The patient expressed understanding and agreed to proceed.  Subjective:   HPI:   Patient presents via [facility] today c/o 2 weeks of morning puffiness underneath eyes bilaterally, mostly laterally. Denies eye pain, drainage. Denies vision changes. Some mild itching in the morning. Notes this improves on its own throughout the day. Does note some sneezing and pnd on occasion.   ROS:   See pertinent positives and negatives per HPI.  Patient Active Problem List   Diagnosis Date Noted  . Age-related osteoporosis without current pathological fracture 09/28/2019  . Vitamin D deficiency 07/08/2018  . Benign paroxysmal positional vertigo 05/30/2015  . Bunion, right foot 06/01/2014  . Tobacco abuse 11/25/2013  . Dysphagia 05/19/2013  . Special screening for malignant neoplasms, colon 05/19/2013  . GERD (gastroesophageal reflux disease) 04/04/2013  . Allergic rhinitis 04/04/2013  . General medical examination 09/11/2010  . PREMATURE VENTRICULAR CONTRACTIONS 11/06/2009  . SUPRAVENTRICULAR TACHYCARDIA 10/05/2009  . LEUKOCYTOPENIA UNSPECIFIED 09/10/2009  . DEPRESSIVE DISORDER 09/06/2009  . ECZEMA 09/06/2009  . PALPITATIONS, OCCASIONAL 06/08/2009  . SHOULDER PAIN, LEFT 05/24/2009  . BACK PAIN 05/24/2009  . SCOLIOSIS 05/24/2009  . LIPOMA 02/02/2008  . Hyperlipidemia 02/02/2008  . HYPERTENSION, BENIGN ESSENTIAL 02/02/2008    Social History   Tobacco Use  . Smoking status: Former Smoker    Packs/day: 0.25     Types: Cigarettes    Quit date: 11/23/2016    Years since quitting: 3.4  . Smokeless tobacco: Never Used  . Tobacco comment: stopped by using nicorette gum. has completely stopped  Substance Use Topics  . Alcohol use: Yes    Alcohol/week: 0.0 standard drinks    Comment: occasionally    Current Outpatient Medications:  .  amLODipine (NORVASC) 10 MG tablet, TAKE 1 TABLET DAILY BEFORE BREAKFAST, Disp: 90 tablet, Rfl: 3 .  atorvastatin (LIPITOR) 20 MG tablet, TAKE 1 TABLET DAILY, Disp: 90 tablet, Rfl: 3 .  dorzolamide (TRUSOPT) 2 % ophthalmic solution, INSTILL 1 DROP INTO BOTH EYES BID, Disp: , Rfl: 1 .  Fluocinolone Acetonide 0.01 % OIL, Place 5 drops in ear(s) 2 (two) times daily., Disp: 20 mL, Rfl: 0 .  LUMIGAN 0.01 % SOLN, 1 drop at bedtime., Disp: , Rfl:  .  Multiple Vitamins-Minerals (MULTIVITAMIN GUMMIES WOMENS) CHEW, Chew 2 Doses by mouth every morning., Disp: , Rfl:  .  pantoprazole (PROTONIX) 40 MG tablet, Take 1 tablet (40 mg total) by mouth 2 (two) times daily., Disp: 60 tablet, Rfl: 0 .  timolol (TIMOPTIC) 0.5 % ophthalmic solution, Place 1 drop into the right eye 2 (two) times daily. , Disp: , Rfl:   No Known Allergies  Objective:   There were no vitals taken for this visit.  Patient is well-developed, well-nourished in no acute distress.  Resting comfortably at home.  Head is normocephalic, atraumatic.  No labored breathing.  Speech is clear and coherent with logical content.  Patient is alert and oriented at baseline.  Slight puffy appearance underneath eyes bilaterally. Allergic shiners noted.  Assessment and Plan:   1. Allergic shiners With mild puffiness mainly  in the AM. Start Xyzal. Warm compresses. Avoiding nasal steroids and other ocular medications due to glaucoma. Potentially allergen on pillow or in bedroom. Discussed air purification. Follow-up in 1 week for reevaluation.  Leeanne Rio, PA-C 05/04/2020

## 2020-05-04 NOTE — Progress Notes (Signed)
I have discussed the procedure for the virtual visit with the patient who has given consent to proceed with assessment and treatment.   Janet Pierce, CMA     

## 2020-05-08 ENCOUNTER — Ambulatory Visit: Payer: Medicare Other | Admitting: Physical Medicine and Rehabilitation

## 2020-05-10 ENCOUNTER — Telehealth: Payer: Self-pay | Admitting: Emergency Medicine

## 2020-05-10 NOTE — Telephone Encounter (Signed)
Received the Re-verification list of patient for Prolia injections. As I reviewed patient chart. Patient had an appointment with PCP to discuss starting Prolia in 09/28/19. I don't see mention of Prolia injection given. Please review

## 2020-05-10 NOTE — Telephone Encounter (Signed)
I don't see where Prolia was ever approved or given.  Of course please double check with pt if she ever got it.  If not, she needs to start injections

## 2020-05-11 ENCOUNTER — Other Ambulatory Visit: Payer: Self-pay

## 2020-05-11 ENCOUNTER — Encounter: Payer: Self-pay | Admitting: Family Medicine

## 2020-05-11 ENCOUNTER — Telehealth (INDEPENDENT_AMBULATORY_CARE_PROVIDER_SITE_OTHER): Payer: Medicare Other | Admitting: Family Medicine

## 2020-05-11 DIAGNOSIS — K219 Gastro-esophageal reflux disease without esophagitis: Secondary | ICD-10-CM

## 2020-05-11 NOTE — Progress Notes (Signed)
I connected with  Denim Start on 05/11/20 by a video enabled telemedicine application and verified that I am speaking with the correct person using two identifiers.   I discussed the limitations of evaluation and management by telemedicine. The patient expressed understanding and agreed to proceed.

## 2020-05-11 NOTE — Progress Notes (Signed)
Virtual Visit via Video   I connected with patient on 05/11/20 at 11:30 AM EST by a video enabled telemedicine application and verified that I am speaking with the correct person using two identifiers.  Location patient: Home Location provider: [facility], Office Persons participating in the virtual visit: Patient, Provider, [place] (Sabrina M)  I discussed the limitations of evaluation and management by telemedicine and the availability of in person appointments. The patient expressed understanding and agreed to proceed.  Subjective:   HPI:   GERD- pt reports she develops substernal chest pain after eating.  sxs started ~2 weeks ago.  She started smoking again.  Some sour brash.  sxs worsen w/ acidic foods, spicy foods.  Took Protonix yesterday and she woke up sx free for the first time in days.  Chest tightness not related to exertion  ROS:   See pertinent positives and negatives per HPI.  Patient Active Problem List   Diagnosis Date Noted   Age-related osteoporosis without current pathological fracture 09/28/2019   Vitamin D deficiency 07/08/2018   Benign paroxysmal positional vertigo 05/30/2015   Bunion, right foot 06/01/2014   Tobacco abuse 11/25/2013   Dysphagia 05/19/2013   Special screening for malignant neoplasms, colon 05/19/2013   GERD (gastroesophageal reflux disease) 04/04/2013   Allergic rhinitis 04/04/2013   General medical examination 09/11/2010   PREMATURE VENTRICULAR CONTRACTIONS 11/06/2009   SUPRAVENTRICULAR TACHYCARDIA 10/05/2009   LEUKOCYTOPENIA UNSPECIFIED 09/10/2009   DEPRESSIVE DISORDER 09/06/2009   ECZEMA 09/06/2009   PALPITATIONS, OCCASIONAL 06/08/2009   SHOULDER PAIN, LEFT 05/24/2009   BACK PAIN 05/24/2009   SCOLIOSIS 05/24/2009   LIPOMA 02/02/2008   Hyperlipidemia 02/02/2008   HYPERTENSION, BENIGN ESSENTIAL 02/02/2008    Social History   Tobacco Use   Smoking status: Former Smoker    Packs/day: 0.25     Types: Cigarettes    Quit date: 11/23/2016    Years since quitting: 3.4   Smokeless tobacco: Never Used   Tobacco comment: stopped by using nicorette gum. has completely stopped  Substance Use Topics   Alcohol use: Yes    Alcohol/week: 0.0 standard drinks    Comment: occasionally    Current Outpatient Medications:    amLODipine (NORVASC) 10 MG tablet, TAKE 1 TABLET DAILY BEFORE BREAKFAST, Disp: 90 tablet, Rfl: 3   atorvastatin (LIPITOR) 20 MG tablet, TAKE 1 TABLET DAILY, Disp: 90 tablet, Rfl: 3   dorzolamide (TRUSOPT) 2 % ophthalmic solution, INSTILL 1 DROP INTO BOTH EYES BID, Disp: , Rfl: 1   Fluocinolone Acetonide 0.01 % OIL, Place 5 drops in ear(s) 2 (two) times daily., Disp: 20 mL, Rfl: 0   levocetirizine (XYZAL) 5 MG tablet, Take 1 tablet (5 mg total) by mouth every evening., Disp: 30 tablet, Rfl: 0   LUMIGAN 0.01 % SOLN, 1 drop at bedtime., Disp: , Rfl:    Multiple Vitamins-Minerals (MULTIVITAMIN GUMMIES WOMENS) CHEW, Chew 2 Doses by mouth every morning., Disp: , Rfl:    pantoprazole (PROTONIX) 40 MG tablet, Take 1 tablet (40 mg total) by mouth 2 (two) times daily., Disp: 60 tablet, Rfl: 0   timolol (TIMOPTIC) 0.5 % ophthalmic solution, Place 1 drop into the right eye 2 (two) times daily. , Disp: , Rfl:   No Known Allergies  Objective:   There were no vitals taken for this visit.  AAOx3, NAD NCAT, EOMI No obvious CN deficits Coloring WNL Pt is able to speak clearly, coherently without shortness of breath or increased work of breathing.  Thought process is linear.  Mood is appropriate.   Assessment and Plan:   GERD- deteriorated again.  Pt had stopped her Protonix once her sxs had improved.  Unfortunately, since then she has resumed smoking, drinks coffee regularly, eats both spicy and acidic foods.  Encouraged her that since she took her protonix yesterday and today is her first day w/o sxs in 2 weeks, that she needs to continue daily.  Since she has tried  multiple times to stop this medication and all have been unsuccessful, she needs to continue medication indefinitely.  Reviewed dietary and lifestyle modifications that can also be helpful.  Reassurance provided that this is not cardiac nor pulmonary given that sxs are worse after eating, wake her from sleep, are not tied to exertion, and improve w/ PPI.  Pt expressed understanding and is in agreement w/ plan.    [name], MD 05/11/2020

## 2020-05-15 NOTE — Telephone Encounter (Signed)
This is the patient I gave you paperwork on for the Prolia.

## 2020-05-16 NOTE — Telephone Encounter (Signed)
Have called [employer] prolia reimbursement specialist to look into patient file for the approval. Will return call with an update.

## 2020-05-23 ENCOUNTER — Telehealth: Payer: Self-pay

## 2020-05-23 NOTE — Telephone Encounter (Signed)
Called patient and left a vm to inform her that her prolia has a $15 copay and to return call to office to let us know if this is ok for her.

## 2020-05-23 NOTE — Telephone Encounter (Signed)
Called and spoke with [facility] G at [facility] for a prolia prior authorization. Hope stated that after giving the code of diagnosis and the J0897 code which represents in office injection that a PA was not required. Ref# is [id number]

## 2020-05-30 ENCOUNTER — Ambulatory Visit (INDEPENDENT_AMBULATORY_CARE_PROVIDER_SITE_OTHER): Payer: Medicare Other | Admitting: Physical Medicine and Rehabilitation

## 2020-05-30 ENCOUNTER — Ambulatory Visit: Payer: Self-pay

## 2020-05-30 ENCOUNTER — Other Ambulatory Visit: Payer: Self-pay

## 2020-05-30 ENCOUNTER — Encounter: Payer: Self-pay | Admitting: Physical Medicine and Rehabilitation

## 2020-05-30 VITALS — BP 104/70 | HR 87

## 2020-05-30 DIAGNOSIS — M47816 Spondylosis without myelopathy or radiculopathy, lumbar region: Secondary | ICD-10-CM

## 2020-05-30 MED ORDER — METHYLPREDNISOLONE ACETATE 80 MG/ML IJ SUSP
80.0000 mg | Freq: Once | INTRAMUSCULAR | Status: AC
Start: 2020-05-30 — End: 2020-05-30
  Administered 2020-05-30: 80 mg

## 2020-05-30 NOTE — Patient Instructions (Signed)

## 2020-05-30 NOTE — Progress Notes (Signed)
Pt state lower back pain mostly on the left side. Pt state laying in bed and getting out of bed makes the pain worse. Pt state she use heating pads and pain cream to help ease her pain.  Numeric Pain Rating Scale and Functional Assessment Average Pain 5   In the last MONTH (on 0-10 scale) has pain interfered with the following?  1. General activity like being  able to carry out your everyday physical activities such as walking, climbing stairs, carrying groceries, or moving a chair?  Rating(6)   +Driver, -BT, -Dye Allergies.

## 2020-05-30 NOTE — Procedures (Signed)
Lumbar Diagnostic Facet Joint Nerve Block with Fluoroscopic Guidance   [name]      Date of Birth: 10-19-1950 MRN: [id number] PCP: Midge Minium, MD      Visit Date: 05/30/2020   Universal Protocol:    Date/Time: 02/09/221:40 PM  Consent Given By: the patient  Position: PRONE  Additional Comments: Vital signs were monitored before and after the procedure. Patient was prepped and draped in the usual sterile fashion. The correct patient, procedure, and site was verified.   Injection Procedure Details:   Procedure diagnoses:  1. Spondylosis without myelopathy or radiculopathy, lumbar region      Meds Administered:  Meds ordered this encounter  Medications  . methylPREDNISolone acetate (DEPO-MEDROL) injection 80 mg     Laterality: Left  Location/Site: L4-L5, L3 and L4 medial branches and L5-S1, L4 medial branch and L5 dorsal ramus  Needle: 5.0 in., 25 ga.  Short bevel or Quincke spinal needle  Needle Placement: Oblique pedical  Findings:   -Comments: There was excellent flow of contrast along the articular pillars without intravascular flow.  Procedure Details: The fluoroscope beam is vertically oriented in AP and then obliqued 15 to 20 degrees to the ipsilateral side of the desired nerve to achieve the "Scotty dog" appearance.  The skin over the target area of the junction of the superior articulating process and the transverse process (sacral ala if blocking the L5 dorsal rami) was locally anesthetized with a 1 ml volume of 1% Lidocaine without Epinephrine.  The spinal needle was inserted and advanced in a trajectory view down to the target.   After contact with periosteum and negative aspirate for blood and CSF, correct placement without intravascular or epidural spread was confirmed by injecting 0.5 ml. of Isovue-250.  A spot radiograph was obtained of this image.    Next, a 0.5 ml. volume of the injectate described above was injected. The needle  was then redirected to the other facet joint nerves mentioned above if needed.  Prior to the procedure, the patient was given a Pain Diary which was completed for baseline measurements.  After the procedure, the patient rated their pain every 30 minutes and will continue rating at this frequency for a total of 5 hours.  The patient has been asked to complete the Diary and return to [place] by mail, fax or hand delivered as soon as possible.   Additional Comments:  The patient tolerated the procedure well Dressing: 2 x 2 sterile gauze and Band-Aid    Post-procedure details: Patient was observed during the procedure. Post-procedure instructions were reviewed.  Patient left the clinic in stable condition.

## 2020-05-30 NOTE — Progress Notes (Signed)
Janet Pierce - 70 y.o. female MRN [id number]  Date of birth: 1951-03-19  Office Visit Note: Visit Date: 05/30/2020 PCP: Midge Minium, MD Referred by: Midge Minium, MD  Subjective: Chief Complaint  Patient presents with  . Lower Back - Pain   HPI:  Janet Pierce is a 70 y.o. female who comes in today for planned repeat Left L4-L5 and L5-S1 Lumbar facet/medial branch block with fluoroscopic guidance.  The patient has failed conservative care including home exercise, medications, time and activity modification.  This injection will be diagnostic and hopefully therapeutic.  Please see requesting physician notes for further details and justification.  Exam shows concordant low back pain with facet joint loading and extension. Patient received more than 80% pain relief from prior injection. This would be the second block in a diagnostic double block paradigm.     Referring:Dr. Eduard Roux   ROS Otherwise per HPI.  Assessment & Plan: Visit Diagnoses:    ICD-10-CM   1. Spondylosis without myelopathy or radiculopathy, lumbar region  M47.816 XR C-ARM NO REPORT    Facet Injection    methylPREDNISolone acetate (DEPO-MEDROL) injection 80 mg    Plan: No additional findings.   Meds & Orders:  Meds ordered this encounter  Medications  . methylPREDNISolone acetate (DEPO-MEDROL) injection 80 mg    Orders Placed This Encounter  Procedures  . Facet Injection  . XR C-ARM NO REPORT    Follow-up: Return if symptoms worsen or fail to improve.   Procedures: No procedures performed  Lumbar Diagnostic Facet Joint Nerve Block with Fluoroscopic Guidance   [name]      Date of Birth: 05/05/1950 MRN: [id number] PCP: Midge Minium, MD      Visit Date: 05/30/2020   Universal Protocol:    Date/Time: 02/09/221:40 PM  Consent Given By: the patient  Position: PRONE  Additional Comments: Vital signs were monitored before and after the procedure. Patient was  prepped and draped in the usual sterile fashion. The correct patient, procedure, and site was verified.   Injection Procedure Details:   Procedure diagnoses:  1. Spondylosis without myelopathy or radiculopathy, lumbar region      Meds Administered:  Meds ordered this encounter  Medications  . methylPREDNISolone acetate (DEPO-MEDROL) injection 80 mg     Laterality: Left  Location/Site: L4-L5, L3 and L4 medial branches and L5-S1, L4 medial branch and L5 dorsal ramus  Needle: 5.0 in., 25 ga.  Short bevel or Quincke spinal needle  Needle Placement: Oblique pedical  Findings:   -Comments: There was excellent flow of contrast along the articular pillars without intravascular flow.  Procedure Details: The fluoroscope beam is vertically oriented in AP and then obliqued 15 to 20 degrees to the ipsilateral side of the desired nerve to achieve the "Scotty dog" appearance.  The skin over the target area of the junction of the superior articulating process and the transverse process (sacral ala if blocking the L5 dorsal rami) was locally anesthetized with a 1 ml volume of 1% Lidocaine without Epinephrine.  The spinal needle was inserted and advanced in a trajectory view down to the target.   After contact with periosteum and negative aspirate for blood and CSF, correct placement without intravascular or epidural spread was confirmed by injecting 0.5 ml. of Isovue-250.  A spot radiograph was obtained of this image.    Next, a 0.5 ml. volume of the injectate described above was injected. The needle was then redirected to the other facet joint  nerves mentioned above if needed.  Prior to the procedure, the patient was given a Pain Diary which was completed for baseline measurements.  After the procedure, the patient rated their pain every 30 minutes and will continue rating at this frequency for a total of 5 hours.  The patient has been asked to complete the Diary and return to [place] by mail, fax or  hand delivered as soon as possible.   Additional Comments:  The patient tolerated the procedure well Dressing: 2 x 2 sterile gauze and Band-Aid    Post-procedure details: Patient was observed during the procedure. Post-procedure instructions were reviewed.  Patient left the clinic in stable condition.     Clinical History: MRI LUMBAR SPINE WITHOUT CONTRAST  TECHNIQUE: Multiplanar, multisequence MR imaging of the lumbar spine was performed. No intravenous contrast was administered.  COMPARISON:  Plain films lumbar spine from [facility] 11/02/2018.  FINDINGS: Segmentation:  Standard.  Alignment: Severe convex left scoliosis with the apex at T12-L1. Facet degenerative change results in 0.6 cm anterolisthesis L4 on L5 and trace anterolisthesis L5 on S1.  Vertebrae: No fracture or worrisome lesion. Degenerative endplate signal change at L4-5 is eccentric to the left.  Conus medullaris and cauda equina: Conus extends to the L2 level. Conus and cauda equina appear normal.  Paraspinal and other soft tissues: Negative.  Disc levels:  T12-L1: Right worse than left facet degenerative change. No disc bulge or protrusion. No stenosis.  L1-2: Bilateral facet degenerative disease appears worse on the right. Minimal disc bulge without stenosis.  L2-3: Facet arthropathy.  Otherwise negative.  L3-4: Mild-to-moderate facet degenerative change. Otherwise negative.  L4-5: Advanced bilateral facet degenerative disease is worse on the left. The disc is uncovered with a shallow bulge. There is mild central canal and left subarticular recess narrowing. The right foramen is open. Marked left foraminal narrowing is present. The exiting left L4 root appears compressed in the foramen  L5-S1: Slight disc uncovering. Bilateral facet degenerative change is worse on the left. The central canal and foramina are widely patent.  IMPRESSION: Given left leg  symptoms, dominant finding is at L4-5 where anterolisthesis, disc and facet arthropathy cause marked left foraminal narrowing. The exiting left L4 root appears compressed in the foramen.  Negative for central canal stenosis.  Severe convex left thoracolumbar scoliosis.   Electronically Signed   By: Inge Rise M.D.   On: 12/14/2018 15:59     Objective:  VS:  HT:    WT:   BMI:     BP:104/70  HR:87bpm  TEMP: ( )  RESP:  Physical Exam Vitals and nursing note reviewed.  Constitutional:      General: She is not in acute distress.    Appearance: Normal appearance. She is not ill-appearing.  HENT:     Head: Normocephalic and atraumatic.     Right Ear: External ear normal.     Left Ear: External ear normal.  Eyes:     Extraocular Movements: Extraocular movements intact.  Cardiovascular:     Rate and Rhythm: Normal rate.     Pulses: Normal pulses.  Pulmonary:     Effort: Pulmonary effort is normal. No respiratory distress.  Abdominal:     General: There is no distension.     Palpations: Abdomen is soft.  Musculoskeletal:        General: Tenderness present.     Cervical back: Neck supple.     Right lower leg: No edema.     Left lower  leg: No edema.     Comments: Patient has good distal strength with no pain over the greater trochanters.  No clonus or focal weakness. Patient somewhat slow to rise from a seated position to full extension.  There is concordant low back pain with facet loading and lumbar spine extension rotation.  There are no definitive trigger points but the patient is somewhat tender across the lower back and PSIS.  There is no pain with hip rotation.   Skin:    Findings: No erythema, lesion or rash.  Neurological:     General: No focal deficit present.     Mental Status: She is alert and oriented to person, place, and time.     Sensory: No sensory deficit.     Motor: No weakness or abnormal muscle tone.     Coordination: Coordination normal.   Psychiatric:        Mood and Affect: Mood normal.        Behavior: Behavior normal.      Imaging: No results found.

## 2020-06-07 ENCOUNTER — Encounter: Payer: Self-pay | Admitting: Family Medicine

## 2020-06-07 ENCOUNTER — Other Ambulatory Visit: Payer: Self-pay

## 2020-06-07 ENCOUNTER — Ambulatory Visit: Payer: Medicare Other | Admitting: Family Medicine

## 2020-06-07 VITALS — BP 110/60 | HR 80 | Temp 98.1°F | Ht 63.0 in | Wt 141.8 lb

## 2020-06-07 DIAGNOSIS — R682 Dry mouth, unspecified: Secondary | ICD-10-CM | POA: Diagnosis not present

## 2020-06-07 DIAGNOSIS — K219 Gastro-esophageal reflux disease without esophagitis: Secondary | ICD-10-CM | POA: Diagnosis not present

## 2020-06-07 DIAGNOSIS — R300 Dysuria: Secondary | ICD-10-CM

## 2020-06-07 LAB — POC URINALSYSI DIPSTICK (AUTOMATED)
Bilirubin, UA: NEGATIVE
Blood, UA: POSITIVE
Glucose, UA: NEGATIVE
Ketones, UA: NEGATIVE
Leukocytes, UA: NEGATIVE
Nitrite, UA: NEGATIVE
Protein, UA: POSITIVE — AB
Spec Grav, UA: 1.02 (ref 1.010–1.025)
Urobilinogen, UA: NEGATIVE E.U./dL — AB
pH, UA: 5 (ref 5.0–8.0)

## 2020-06-07 MED ORDER — CEPHALEXIN 500 MG PO CAPS: 500.0000 mg | ORAL_CAPSULE | Freq: Two times a day (BID) | ORAL | 0 refills | Status: AC

## 2020-06-07 NOTE — Patient Instructions (Signed)
Follow up as needed or as scheduled We'll notify you of your urine culture Drink LOTS of fluids START the Cephalexin twice daily for suspected UTI- take w/ food USE a humidifier in your room at night We'll call you with your GI appt CONTINUE to take your acid reducer as directed Call with any questions or concerns Hang in there!

## 2020-06-07 NOTE — Progress Notes (Signed)
   Subjective:    Patient ID: Janet Pierce, female    DOB: 12-Jan-1951, 70 y.o.   MRN: [id number]  HPI Dysuria- new.  Pt reports clitoris is red and inflamed.  Pt took 2 days of AZO.  Now having some itching.  Denies vaginal discharge.  sxs started 6-7 days ago w/ dysuria and 'pinching' sensation after urinating.  + frequency.  Denies urgency.  Denies suprapubic or CVA tenderness.  No N/V.  Dry mouth- pt is waking w/ severe dry mouth.  Pt sleeps w/ mouth open.  Pt has a humidifier at home but has not been using.    GERD- pt reports she will wake w/ a pain in her throat.  She states she will have increased burping in the morning.  Pain improves as day goes on.   Review of Systems For ROS see HPI   This visit occurred during the SARS-CoV-2 public health emergency.  Safety protocols were in place, including screening questions prior to the visit, additional usage of staff PPE, and extensive cleaning of exam room while observing appropriate contact time as indicated for disinfecting solutions.       Objective:   Physical Exam Vitals reviewed.  Constitutional:      General: She is not in acute distress.    Appearance: Normal appearance. She is not ill-appearing.  HENT:     Head: Normocephalic and atraumatic.     Mouth/Throat:     Mouth: Mucous membranes are moist.     Pharynx: No oropharyngeal exudate or posterior oropharyngeal erythema.  Eyes:     Extraocular Movements: Extraocular movements intact.     Pupils: Pupils are equal, round, and reactive to light.  Abdominal:     Tenderness: There is no abdominal tenderness. There is no right CVA tenderness or left CVA tenderness.  Skin:    General: Skin is warm and dry.  Neurological:     General: No focal deficit present.     Mental Status: She is alert and oriented to person, place, and time.  Psychiatric:        Mood and Affect: Mood normal.        Behavior: Behavior normal.        Thought Content: Thought content normal.            Assessment & Plan:  Dysuria- new.  Pt reports burning, frequency.  Some improvement w/ AZO but not resolution.  UA w/ blood.  Will start empiric Keflex and wait for urine culture.  Pt expressed understanding and is in agreement w/ plan.   GERD- deteriorated.  Pt is waking up w/ sore throat almost daily.  Increased gas/burping.  She is now taking her Protonix regularly whereas in the past she takes it sporadically.  Due to the sore throat and persistence of sxs despite BID PPI, will refer to GI.  Pt expressed understanding and is in agreement w/ plan.   Dry Mouth- new.  Suspect this is environmental as she sleeps w/ her mouth open, has an electric heater next to her bed.  Encouraged her to use a humidifier at night.  Will follow.

## 2020-06-09 LAB — URINE CULTURE
MICRO NUMBER:: 11549431
Result:: NO GROWTH
SPECIMEN QUALITY:: ADEQUATE

## 2020-06-14 NOTE — Progress Notes (Deleted)
Subjective:   Janet Pierce is a 70 y.o. female who presents for Medicare Annual (Subsequent) preventive examination.  Review of Systems    ***       Objective:    There were no vitals filed for this visit. There is no height or weight on file to calculate BMI.  Advanced Directives 04/27/2019 04/20/2018 04/16/2017 07/15/2016 06/19/2015 07/15/2014 02/22/2013  Does Patient Have a Medical Advance Directive? No No No No No Yes Patient does not have advance directive;Patient would not like information  Type of Advance Directive - - - - - Living will -  Would patient like information on creating a medical advance directive? Yes (MAU/Ambulatory/Procedural Areas - Information given) Yes (MAU/Ambulatory/Procedural Areas - Information given) Yes (MAU/Ambulatory/Procedural Areas - Information given) - No - patient declined information - -    Current Medications (verified) Outpatient Encounter Medications as of 06/18/2020  Medication Sig  . amLODipine (NORVASC) 10 MG tablet TAKE 1 TABLET DAILY BEFORE BREAKFAST  . atorvastatin (LIPITOR) 20 MG tablet TAKE 1 TABLET DAILY  . dorzolamide (TRUSOPT) 2 % ophthalmic solution INSTILL 1 DROP INTO BOTH EYES BID  . Fluocinolone Acetonide 0.01 % OIL Place 5 drops in ear(s) 2 (two) times daily.  Marland Kitchen levocetirizine (XYZAL) 5 MG tablet Take 1 tablet (5 mg total) by mouth every evening.  Marland Kitchen LUMIGAN 0.01 % SOLN 1 drop at bedtime.  . Multiple Vitamins-Minerals (MULTIVITAMIN GUMMIES WOMENS) CHEW Chew 2 Doses by mouth every morning.  . pantoprazole (PROTONIX) 40 MG tablet Take 1 tablet (40 mg total) by mouth 2 (two) times daily.  . timolol (TIMOPTIC) 0.5 % ophthalmic solution Place 1 drop into the right eye 2 (two) times daily.    No facility-administered encounter medications on file as of 06/18/2020.    Allergies (verified) Patient has no known allergies.   History: Past Medical History:  Diagnosis Date  . Arthritis   . Glaucoma   . Hyperlipidemia   .  Hypertension   . Retinal detachment    left eye prosthesis  . Scoliosis    Past Surgical History:  Procedure Laterality Date  . EYE SURGERY Right    glucoma  . LIPOMA EXCISION    . RETINAL DETACHMENT SURGERY    . TONSILLECTOMY     Family History  Problem Relation Age of Onset  . Lung cancer Mother   . Hypertension Mother   . Bone cancer Mother   . Diabetes Maternal Grandmother   . Colon cancer Neg Hx    Social History   Socioeconomic History  . Marital status: Legally Separated    Spouse name: Not on file  . Number of children: 2  . Years of education: Not on file  . Highest education level: Not on file  Occupational History  . Occupation: Chemical engineer    Comment: [employer]  Tobacco Use  . Smoking status: Former Smoker    Packs/day: 0.25    Types: Cigarettes    Quit date: 11/23/2016    Years since quitting: 3.5  . Smokeless tobacco: Never Used  . Tobacco comment: stopped by using nicorette gum. has completely stopped  Vaping Use  . Vaping Use: Never used  Substance and Sexual Activity  . Alcohol use: Yes    Alcohol/week: 0.0 standard drinks    Comment: occasionally  . Drug use: No  . Sexual activity: Not on file  Other Topics Concern  . Not on file  Social History Narrative  . Not on file   Social  Determinants of Health   Financial Resource Strain: Not on file  Food Insecurity: Not on file  Transportation Needs: Not on file  Physical Activity: Not on file  Stress: Not on file  Social Connections: Not on file    Tobacco Counseling Counseling given: Not Answered Comment: stopped by using nicorette gum. has completely stopped   Clinical Intake:                 Diabetic?No         Activities of Daily Living In your present state of health, do you have any difficulty performing the following activities: 05/11/2020 02/09/2020  Hearing? N N  Vision? N Y  Comment - Laser follow up after catarac surgery  Difficulty concentrating or  making decisions? N N  Walking or climbing stairs? N N  Dressing or bathing? N N  Doing errands, shopping? N N  Some recent data might be hidden    Patient Care Team: Midge Minium, MD as PCP - [place], [place] (Hand Surgery) Magnus Sinning, MD as Consulting Physician (Physical Medicine and Rehabilitation) Leandrew Koyanagi, MD as Consulting Physician (Orthopedic Surgery) Corey Harold, MD as Consulting Physician (Ophthalmology)  Indicate any recent Medical Services you may have received from other than [facility] providers in the past year (date may be approximate).     Assessment:   This is a routine wellness examination for Janet Pierce.  Hearing/Vision screen No exam data present  Dietary issues and exercise activities discussed:    Goals    . Increase physical activity     Increase activity and decrease sugar intake.     . Increase physical activity     Begin swimming, volunteer more, complete personal goals.       Depression Screen PHQ 2/9 Scores 05/11/2020 02/09/2020 12/27/2019 11/10/2019 09/28/2019 07/26/2019 04/27/2019  PHQ - 2 Score 0 0 0 0 0 0 0  PHQ- 9 Score 0 0 - 0 0 - -  Exception Documentation - - - - - - -    Fall Risk Fall Risk  05/11/2020 02/09/2020 11/10/2019 09/28/2019 07/26/2019  Falls in the past year? 0 0 0 0 0  Number falls in past yr: 0 0 0 0 0  Injury with Fall? 0 0 0 0 0  Risk for fall due to : No Fall Risks No Fall Risks - - -  Follow up - - Falls evaluation completed Falls evaluation completed Falls evaluation completed    FALL RISK PREVENTION PERTAINING TO THE HOME:  Any stairs in or around the home? {YES/NO:21197} If so, are there any without handrails? {YES/NO:21197} Home free of loose throw rugs in walkways, pet beds, electrical cords, etc? {YES/NO:21197} Adequate lighting in your home to reduce risk of falls? {YES/NO:21197}  ASSISTIVE DEVICES UTILIZED TO PREVENT FALLS:  Life alert? {YES/NO:21197} Use of a cane, walker or w/c?  {YES/NO:21197} Grab bars in the bathroom? {YES/NO:21197} Shower chair or bench in shower? {YES/NO:21197} Elevated toilet seat or a handicapped toilet? {YES/NO:21197}  TIMED UP AND GO:  Was the test performed? {YES/NO:21197}.  Length of time to ambulate 10 feet: *** sec.   {Appearance of Gait:2101803}  Cognitive Function: MMSE - Mini Mental State Exam 04/20/2018  Orientation to time 5  Orientation to Place 5  Registration 3  Attention/ Calculation 5  Recall 2  Language- name 2 objects 2  Language- repeat 1  Language- follow 3 step command 3  Language- read & follow direction 1  Write a  sentence 1  Copy design 1  Total score 29        Immunizations Immunization History  Administered Date(s) Administered  . Moderna SARS-COV2 Booster Vaccination 03/12/2020  . Moderna Sars-Covid-2 Vaccination 05/22/2019, 06/19/2019    TDAP status: Due, Education has been provided regarding the importance of this vaccine. Advised may receive this vaccine at local pharmacy or Health Dept. Aware to provide a copy of the vaccination record if obtained from local pharmacy or Health Dept. Verbalized acceptance and understanding.  {Flu Vaccine status:2101806}  {Pneumococcal vaccine status:2101807}  Covid-19 vaccine status: Completed vaccines  Qualifies for Shingles Vaccine? Yes   Zostavax completed No   Shingrix Completed?: No.    Education has been provided regarding the importance of this vaccine. Patient has been advised to call insurance company to determine out of pocket expense if they have not yet received this vaccine. Advised may also receive vaccine at local pharmacy or Health Dept. Verbalized acceptance and understanding.  Screening Tests Health Maintenance  Topic Date Due  . Fecal DNA (Cologuard)  Never done  . INFLUENZA VACCINE  07/19/2020 (Originally 11/20/2019)  . TETANUS/TDAP  07/31/2020 (Originally 03/23/1970)  . PNA vac Low Risk Adult (1 of 2 - PCV13) 07/31/2020 (Originally  03/23/2016)  . Hepatitis C Screening  09/27/2020 (Originally 02/01/1951)  . MAMMOGRAM  09/15/2021  . DEXA SCAN  Completed  . COVID-19 Vaccine  Completed    Health Maintenance  Health Maintenance Due  Topic Date Due  . Fecal DNA (Cologuard)  Never done    {Colorectal cancer screening:2101809}  Mammogram status: Completed Bilateral 09/16/2019. Repeat every year  Bone Density status: Completed 09/16/2019. Results reflect: Bone density results: OSTEOPOROSIS. Repeat every 2 years.  Lung Cancer Screening: (Low Dose CT Chest recommended if Age 58-80 years, 30 pack-year currently smoking OR have quit w/in 15years.) {DOES NOT does:27190::"does not"} qualify.   Lung Cancer Screening Referral: ***  Additional Screening:  Hepatitis C Screening: does qualify; Completed ***  Vision Screening: Recommended annual ophthalmology exams for early detection of glaucoma and other disorders of the eye. Is the patient up to date with their annual eye exam?  {YES/NO:21197} Who is the provider or what is the name of the office in which the patient attends annual eye exams? *** If pt is not established with a provider, would they like to be referred to a provider to establish care? {YES/NO:21197}.   Dental Screening: Recommended annual dental exams for proper oral hygiene  Community Resource Referral / Chronic Care Management: CRR required this visit?  {YES/NO:21197}  CCM required this visit?  {YES/NO:21197}     Plan:     I have personally reviewed and noted the following in the patient's chart:   . Medical and social history . Use of alcohol, tobacco or illicit drugs  . Current medications and supplements . Functional ability and status . Nutritional status . Physical activity . Advanced directives . List of other physicians . Hospitalizations, surgeries, and ER visits in previous 12 months . Vitals . Screenings to include cognitive, depression, and falls . Referrals and  appointments  In addition, I have reviewed and discussed with patient certain preventive protocols, quality metrics, and best practice recommendations. A written personalized care plan for preventive services as well as general preventive health recommendations were provided to patient.     Marta Antu, LPN   7/34/1937  Nurse Health Advisor  Nurse Notes: ***

## 2020-06-18 ENCOUNTER — Ambulatory Visit: Payer: Medicare Other

## 2020-06-19 ENCOUNTER — Telehealth: Payer: Self-pay

## 2020-06-19 NOTE — Telephone Encounter (Signed)
Called patient to see when she can come in for her prolia injection. Pt will return call to office  to schedule a nurse visit.

## 2020-07-11 ENCOUNTER — Other Ambulatory Visit: Payer: Self-pay | Admitting: Family Medicine

## 2020-08-06 ENCOUNTER — Ambulatory Visit (INDEPENDENT_AMBULATORY_CARE_PROVIDER_SITE_OTHER): Payer: Medicare Other

## 2020-08-06 VITALS — Ht 63.0 in | Wt 141.0 lb

## 2020-08-06 DIAGNOSIS — Z0000 Encounter for general adult medical examination without abnormal findings: Secondary | ICD-10-CM | POA: Diagnosis not present

## 2020-08-06 NOTE — Patient Instructions (Signed)
Janet Pierce , Thank you for taking time to come for your Medicare Wellness Visit. I appreciate your ongoing commitment to your health goals. Please review the following plan we discussed and let me know if I can assist you in the future.   Screening recommendations/referrals: Colonoscopy: Cologuard ordered 04/2019-Please complete & send specimen back to the lab. Mammogram: Completed 09/16/2019-Due 09/15/2020 Bone Density: Completed 09/16/2019-Due 09/15/2021 Recommended yearly ophthalmology/optometry visit for glaucoma screening and checkup Recommended yearly dental visit for hygiene and checkup  Vaccinations: Influenza vaccine: Declined Pneumococcal vaccine: Due-Discuss with Dr. Birdie Riddle Tdap vaccine: Discuss with pharmacy Shingles vaccine: Discuss with pharmacy   Covid-19:Completed vaccines  Advanced directives: Declined information today  Conditions/risks identified: See problem list  Next appointment: Follow up in one year for your annual wellness visit    Preventive Care 65 Years and Older, Female Preventive care refers to lifestyle choices and visits with your health care provider that can promote health and wellness. What does preventive care include?  A yearly physical exam. This is also called an annual well check.  Dental exams once or twice a year.  Routine eye exams. Ask your health care provider how often you should have your eyes checked.  Personal lifestyle choices, including:  Daily care of your teeth and gums.  Regular physical activity.  Eating a healthy diet.  Avoiding tobacco and drug use.  Limiting alcohol use.  Practicing safe sex.  Taking low-dose aspirin every day.  Taking vitamin and mineral supplements as recommended by your health care provider. What happens during an annual well check? The services and screenings done by your health care provider during your annual well check will depend on your age, overall health, lifestyle risk factors, and  family history of disease. Counseling  Your health care provider may ask you questions about your:  Alcohol use.  Tobacco use.  Drug use.  Emotional well-being.  Home and relationship well-being.  Sexual activity.  Eating habits.  History of falls.  Memory and ability to understand (cognition).  Work and work Statistician.  Reproductive health. Screening  You may have the following tests or measurements:  Height, weight, and BMI.  Blood pressure.  Lipid and cholesterol levels. These may be checked every 5 years, or more frequently if you are over 35 years old.  Skin check.  Lung cancer screening. You may have this screening every year starting at age 49 if you have a 30-pack-year history of smoking and currently smoke or have quit within the past 15 years.  Fecal occult blood test (FOBT) of the stool. You may have this test every year starting at age 19.  Flexible sigmoidoscopy or colonoscopy. You may have a sigmoidoscopy every 5 years or a colonoscopy every 10 years starting at age 62.  Hepatitis C blood test.  Hepatitis B blood test.  Sexually transmitted disease (STD) testing.  Diabetes screening. This is done by checking your blood sugar (glucose) after you have not eaten for a while (fasting). You may have this done every 1-3 years.  Bone density scan. This is done to screen for osteoporosis. You may have this done starting at age 16.  Mammogram. This may be done every 1-2 years. Talk to your health care provider about how often you should have regular mammograms. Talk with your health care provider about your test results, treatment options, and if necessary, the need for more tests. Vaccines  Your health care provider may recommend certain vaccines, such as:  Influenza vaccine. This is recommended  every year.  Tetanus, diphtheria, and acellular pertussis (Tdap, Td) vaccine. You may need a Td booster every 10 years.  Zoster vaccine. You may need this  after age 66.  Pneumococcal 13-valent conjugate (PCV13) vaccine. One dose is recommended after age 21.  Pneumococcal polysaccharide (PPSV23) vaccine. One dose is recommended after age 31. Talk to your health care provider about which screenings and vaccines you need and how often you need them. This information is not intended to replace advice given to you by your health care provider. Make sure you discuss any questions you have with your health care provider. Document Released: 05/04/2015 Document Revised: 12/26/2015 Document Reviewed: 02/06/2015 Elsevier Interactive Patient Education  2017 [place] Prevention in the Home Falls can cause injuries. They can happen to people of all ages. There are many things you can do to make your home safe and to help prevent falls. What can I do on the outside of my home?  Regularly fix the edges of walkways and driveways and fix any cracks.  Remove anything that might make you trip as you walk through a door, such as a raised step or threshold.  Trim any bushes or trees on the path to your home.  Use bright outdoor lighting.  Clear any walking paths of anything that might make someone trip, such as rocks or tools.  Regularly check to see if handrails are loose or broken. Make sure that both sides of any steps have handrails.  Any raised decks and porches should have guardrails on the edges.  Have any leaves, snow, or ice cleared regularly.  Use sand or salt on walking paths during winter.  Clean up any spills in your garage right away. This includes oil or grease spills. What can I do in the bathroom?  Use night lights.  Install grab bars by the toilet and in the tub and shower. Do not use towel bars as grab bars.  Use non-skid mats or decals in the tub or shower.  If you need to sit down in the shower, use a plastic, non-slip stool.  Keep the floor dry. Clean up any water that spills on the floor as soon as it  happens.  Remove soap buildup in the tub or shower regularly.  Attach bath mats securely with double-sided non-slip rug tape.  Do not have throw rugs and other things on the floor that can make you trip. What can I do in the bedroom?  Use night lights.  Make sure that you have a light by your bed that is easy to reach.  Do not use any sheets or blankets that are too big for your bed. They should not hang down onto the floor.  Have a firm chair that has side arms. You can use this for support while you get dressed.  Do not have throw rugs and other things on the floor that can make you trip. What can I do in the kitchen?  Clean up any spills right away.  Avoid walking on wet floors.  Keep items that you use a lot in easy-to-reach places.  If you need to reach something above you, use a strong step stool that has a grab bar.  Keep electrical cords out of the way.  Do not use floor polish or wax that makes floors slippery. If you must use wax, use non-skid floor wax.  Do not have throw rugs and other things on the floor that can make you trip. What  can I do with my stairs?  Do not leave any items on the stairs.  Make sure that there are handrails on both sides of the stairs and use them. Fix handrails that are broken or loose. Make sure that handrails are as long as the stairways.  Check any carpeting to make sure that it is firmly attached to the stairs. Fix any carpet that is loose or worn.  Avoid having throw rugs at the top or bottom of the stairs. If you do have throw rugs, attach them to the floor with carpet tape.  Make sure that you have a light switch at the top of the stairs and the bottom of the stairs. If you do not have them, ask someone to add them for you. What else can I do to help prevent falls?  Wear shoes that:  Do not have high heels.  Have rubber bottoms.  Are comfortable and fit you well.  Are closed at the toe. Do not wear sandals.  If you  use a stepladder:  Make sure that it is fully opened. Do not climb a closed stepladder.  Make sure that both sides of the stepladder are locked into place.  Ask someone to hold it for you, if possible.  Clearly mark and make sure that you can see:  Any grab bars or handrails.  First and last steps.  Where the edge of each step is.  Use tools that help you move around (mobility aids) if they are needed. These include:  Canes.  Walkers.  Scooters.  Crutches.  Turn on the lights when you go into a dark area. Replace any light bulbs as soon as they burn out.  Set up your furniture so you have a clear path. Avoid moving your furniture around.  If any of your floors are uneven, fix them.  If there are any pets around you, be aware of where they are.  Review your medicines with your doctor. Some medicines can make you feel dizzy. This can increase your chance of falling. Ask your doctor what other things that you can do to help prevent falls. This information is not intended to replace advice given to you by your health care provider. Make sure you discuss any questions you have with your health care provider. Document Released: 02/01/2009 Document Revised: 09/13/2015 Document Reviewed: 05/12/2014 Elsevier Interactive Patient Education  2017 [employer].

## 2020-08-06 NOTE — Progress Notes (Signed)
Subjective:   Janet Pierce is a 70 y.o. female who presents for Medicare Annual (Subsequent) preventive examination.   I connected with Janet Pierce today by telephone and verified that I am speaking with the correct person using two identifiers. Location patient: home Location provider: work Persons participating in the virtual visit: patient, Marine scientist.    I discussed the limitations, risks, security and privacy concerns of performing an evaluation and management service by telephone and the availability of in person appointments. I also discussed with the patient that there may be a patient responsible charge related to this service. The patient expressed understanding and verbally consented to this telephonic visit.    Interactive audio and video telecommunications were attempted between this provider and patient, however failed, due to patient having technical difficulties OR patient did not have access to video capability.  We continued and completed visit with audio only.  Some vital signs may be absent or patient reported.   Time Spent with patient on telephone encounter: 30 minutes   Review of Systems     Cardiac Risk Factors include: advanced age (>67mn, >>46women);dyslipidemia;hypertension     Objective:    Today's Vitals   08/06/20 1246  Weight: 141 lb (64 kg)  Height: 5' 3" (1.6 m)   Body mass index is 24.98 kg/m.  Advanced Directives 08/06/2020 04/27/2019 04/20/2018 04/16/2017 07/15/2016 06/19/2015 07/15/2014  Does Patient Have a Medical Advance Directive? _0  No Yes  Type of Advance Directive - - - - - - Living will  Would patient like information on creating a medical advance directive? No - Patient declined Yes (MAU/Ambulatory/Procedural Areas - Information given) Yes (MAU/Ambulatory/Procedural Areas - Information given) Yes (MAU/Ambulatory/Procedural Areas - Information given) - No - patient declined information -    Current Medications (verified) Outpatient  Encounter Medications as of 08/06/2020  Medication Sig  . amLODipine (NORVASC) 10 MG tablet TAKE 1 TABLET DAILY BEFORE BREAKFAST  . atorvastatin (LIPITOR) 20 MG tablet TAKE 1 TABLET DAILY  . dorzolamide (TRUSOPT) 2 % ophthalmic solution INSTILL 1 DROP INTO BOTH EYES BID  . Fluocinolone Acetonide 0.01 % OIL Place 5 drops in ear(s) 2 (two) times daily.  .Marland Kitchenlevocetirizine (XYZAL) 5 MG tablet Take 1 tablet (5 mg total) by mouth every evening.  .Marland KitchenLUMIGAN 0.01 % SOLN 1 drop at bedtime.  . Multiple Vitamins-Minerals (MULTIVITAMIN GUMMIES WOMENS) CHEW Chew 2 Doses by mouth every morning.  . pantoprazole (PROTONIX) 40 MG tablet Take 1 tablet (40 mg total) by mouth 2 (two) times daily.  . timolol (TIMOPTIC) 0.5 % ophthalmic solution Place 1 drop into the right eye 2 (two) times daily.    No facility-administered encounter medications on file as of 08/06/2020.    Allergies (verified) Patient has no known allergies.   History: Past Medical History:  Diagnosis Date  . Arthritis   . Glaucoma   . Hyperlipidemia   . Hypertension   . Retinal detachment    left eye prosthesis  . Scoliosis    Past Surgical History:  Procedure Laterality Date  . CATARACT EXTRACTION    . EYE SURGERY Right    glucoma  . LIPOMA EXCISION    . RETINAL DETACHMENT SURGERY    . TONSILLECTOMY     Family History  Problem Relation Age of Onset  . Lung cancer Mother   . Hypertension Mother   . Bone cancer Mother   . Diabetes Maternal Grandmother   . Colon cancer Neg Hx  Social History   Socioeconomic History  . Marital status: Legally Separated    Spouse name: Not on file  . Number of children: 2  . Years of education: Not on file  . Highest education level: Not on file  Occupational History  . Occupation: Chemical engineer    Comment: [employer]  Tobacco Use  . Smoking status: Former Smoker    Packs/day: 0.25    Types: Cigarettes    Quit date: 11/23/2016    Years since quitting: 3.7  . Smokeless tobacco:  Never Used  . Tobacco comment: stopped by using nicorette gum. has completely stopped  Vaping Use  . Vaping Use: Never used  Substance and Sexual Activity  . Alcohol use: Yes    Alcohol/week: 0.0 standard drinks    Comment: occasionally  . Drug use: No  . Sexual activity: Not on file  Other Topics Concern  . Not on file  Social History Narrative  . Not on file   Social Determinants of Health   Financial Resource Strain: Low Risk   . Difficulty of Paying Living Expenses: Not hard at all  Food Insecurity: No Food Insecurity  . Worried About Charity fundraiser in the Last Year: Never true  . Ran Out of Food in the Last Year: Never true  Transportation Needs: No Transportation Needs  . Lack of Transportation (Medical): No  . Lack of Transportation (Non-Medical): No  Physical Activity: Insufficiently Active  . Days of Exercise per Week: 2 days  . Minutes of Exercise per Session: 60 min  Stress: No Stress Concern Present  . Feeling of Stress : Not at all  Social Connections: Moderately Isolated  . Frequency of Communication with Friends and Family: More than three times a week  . Frequency of Social Gatherings with Friends and Family: More than three times a week  . Attends Religious Services: Never  . Active Member of Clubs or Organizations: Yes  . Attends Archivist Meetings: More than 4 times per year  . Marital Status: Separated    Tobacco Counseling Counseling given: Not Answered Comment: stopped by using nicorette gum. has completely stopped   Clinical Intake:  Pre-visit preparation completed: Yes  Pain : 0-10 Pain Type: Chronic pain Pain Location: Shoulder (radiates to left breast & sometimes is on the right side) Pain Orientation: Right,Left Pain Onset: More than a month ago Pain Frequency: Intermittent     Nutritional Status: BMI of 19-24  Normal Nutritional Risks: None Diabetes: No  How often do you need to have someone help you when you  read instructions, pamphlets, or other written materials from your doctor or pharmacy?: 1 - Never  Diabetic?No  Interpreter Needed?: No  Information entered by :: Caroleen Hamman LPN   Activities of Daily Living In your present state of health, do you have any difficulty performing the following activities: 08/06/2020 05/11/2020  Hearing? N N  Vision? N N  Comment - -  Difficulty concentrating or making decisions? N N  Walking or climbing stairs? N N  Dressing or bathing? N N  Doing errands, shopping? N N  Preparing Food and eating ? N -  Using the Toilet? N -  In the past six months, have you accidently leaked urine? N -  Do you have problems with loss of bowel control? N -  Managing your Medications? N -  Managing your Finances? N -  Housekeeping or managing your Housekeeping? N -  Some recent data might be  hidden    Patient Care Team: Midge Minium, MD as PCP - [place], [place] (Hand Surgery) Magnus Sinning, MD as Consulting Physician (Physical Medicine and Rehabilitation) Leandrew Koyanagi, MD as Consulting Physician (Orthopedic Surgery) Corey Harold, MD as Consulting Physician (Ophthalmology)  Indicate any recent Medical Services you may have received from other than [facility] providers in the past year (date may be approximate).     Assessment:   This is a routine wellness examination for Janet Pierce.  Hearing/Vision screen  Hearing Screening   '125Hz'$  $Remo'250Hz'eBqMh$'500Hz'$'1000Hz'$'2000Hz'$'3000Hz'$'4000Hz'$'6000Hz'$'8000Hz'$   Right ear:           Left ear:           Comments: No issues  Vision Screening Comments: Reading glasses Last eye exam-07/2020-Dr. Sun  Dietary issues and exercise activities discussed: Current Exercise Habits: Home exercise routine, Type of exercise: Other - see comments (bowling), Time (Minutes): 60, Frequency (Times/Week): 2, Weekly Exercise (Minutes/Week): 120, Intensity: Mild, Exercise limited by: None identified  Goals    . Increase physical  activity     Increase activity and decrease sugar intake.       Depression Screen PHQ 2/9 Scores 08/06/2020 05/11/2020 02/09/2020 12/27/2019 11/10/2019 09/28/2019 07/26/2019  PHQ - 2 Score 1 0 0 0 0 0 0  PHQ- 9 Score - 0 0 - 0 0 -  Exception Documentation - - - - - - -    Fall Risk Fall Risk  08/06/2020 05/11/2020 02/09/2020 11/10/2019 09/28/2019  Falls in the past year? 0 0 0 0 0  Number falls in past yr: 0 0 0 0 0  Injury with Fall? 0 0 0 0 0  Risk for fall due to : - No Fall Risks No Fall Risks - -  Follow up Falls prevention discussed - - Falls evaluation completed Falls evaluation completed    FALL RISK PREVENTION PERTAINING TO THE HOME:  Any stairs in or around the home? Yes  If so, are there any without handrails? No  Home free of loose throw rugs in walkways, pet beds, electrical cords, etc? Yes  Adequate lighting in your home to reduce risk of falls? Yes   ASSISTIVE DEVICES UTILIZED TO PREVENT FALLS:  Life alert? No  Use of a cane, walker or w/c? No  Grab bars in the bathroom? No  Shower chair or bench in shower? No  Elevated toilet seat or a handicapped toilet? No   TIMED UP AND GO:  Was the test performed? No .phone visit    Cognitive Function:Normal cognitive status assessed by  this Nurse Health Advisor. No abnormalities found.   MMSE - Mini Mental State Exam 04/20/2018  Orientation to time 5  Orientation to Place 5  Registration 3  Attention/ Calculation 5  Recall 2  Language- name 2 objects 2  Language- repeat 1  Language- follow 3 step command 3  Language- read & follow direction 1  Write a sentence 1  Copy design 1  Total score 29        Immunizations Immunization History  Administered Date(s) Administered  . Moderna SARS-COV2 Booster Vaccination 03/12/2020  . Moderna Sars-Covid-2 Vaccination 05/22/2019, 06/19/2019    TDAP status: Due, Education has been provided regarding the importance of this vaccine. Advised may receive this vaccine at local  pharmacy or Health Dept. Aware to provide a copy of the vaccination record if obtained from local pharmacy or Health Dept. Verbalized acceptance and understanding.  Flu  Vaccine status: Declined, Education has been provided regarding the importance of this vaccine but patient still declined. Advised may receive this vaccine at local pharmacy or Health Dept. Aware to provide a copy of the vaccination record if obtained from local pharmacy or Health Dept. Verbalized acceptance and understanding.  Pneumococcal vaccine status: Due, Education has been provided regarding the importance of this vaccine. Advised may receive this vaccine at local pharmacy or Health Dept. Aware to provide a copy of the vaccination record if obtained from local pharmacy or Health Dept. Verbalized acceptance and understanding.  Covid-19 vaccine status: Completed vaccines  Qualifies for Shingles Vaccine? Yes   Zostavax completed No   Shingrix Completed?: No.    Education has been provided regarding the importance of this vaccine. Patient has been advised to call insurance company to determine out of pocket expense if they have not yet received this vaccine. Advised may also receive vaccine at local pharmacy or Health Dept. Verbalized acceptance and understanding.  Screening Tests Health Maintenance  Topic Date Due  . TETANUS/TDAP  Never done  . Fecal DNA (Cologuard)  Never done  . PNA vac Low Risk Adult (1 of 2 - PCV13) Never done  . Hepatitis C Screening  09/27/2020 (Originally 1951/01/29)  . INFLUENZA VACCINE  11/19/2020  . MAMMOGRAM  09/15/2021  . DEXA SCAN  Completed  . COVID-19 Vaccine  Completed  . HPV VACCINES  Aged Out    Health Maintenance  Health Maintenance Due  Topic Date Due  . TETANUS/TDAP  Never done  . Fecal DNA (Cologuard)  Never done  . PNA vac Low Risk Adult (1 of 2 - PCV13) Never done    Colorectal cancer screening: Cologuard ordered 04/2019-Patient has kit & says she will collect specimen &  send to lab.  Mammogram status: Completed Bilateral 09/16/2019. Repeat every year  Bone Density status: Completed 09/16/2019. Results reflect: Bone density results: OSTEOPOROSIS. Repeat every 2 years.  Lung Cancer Screening: (Low Dose CT Chest recommended if Age 67-80 years, 30 pack-year currently smoking OR have quit w/in 15years.) does not qualify.   Additional Screening:  Hepatitis C Screening: does qualify; Declined  Vision Screening: Recommended annual ophthalmology exams for early detection of glaucoma and other disorders of the eye. Is the patient up to date with their annual eye exam?  Yes  Who is the provider or what is the name of the office in which the patient attends annual eye exams? Dr. Nancy Fetter  Dental Screening: Recommended annual dental exams for proper oral hygiene  Community Resource Referral / Chronic Care Management: CRR required this visit?  No   CCM required this visit?  No      Plan:     I have personally reviewed and noted the following in the patient's chart:   . Medical and social history . Use of alcohol, tobacco or illicit drugs  . Current medications and supplements . Functional ability and status . Nutritional status . Physical activity . Advanced directives . List of other physicians . Hospitalizations, surgeries, and ER visits in previous 12 months . Vitals . Screenings to include cognitive, depression, and falls . Referrals and appointments  In addition, I have reviewed and discussed with patient certain preventive protocols, quality metrics, and best practice recommendations. A written personalized care plan for preventive services as well as general preventive health recommendations were provided to patient.   Due to this being a telephonic visit, the after visit summary with patients personalized plan was offered to patient  via mail or my-chart. Patient would like to access on my-chart.   Marta Antu, LPN   [id number]  Nurse Health  Advisor  Nurse Notes: None

## 2020-08-08 ENCOUNTER — Ambulatory Visit: Payer: Medicare Other | Admitting: Family Medicine

## 2020-08-08 ENCOUNTER — Encounter: Payer: Self-pay | Admitting: Family Medicine

## 2020-08-08 ENCOUNTER — Other Ambulatory Visit: Payer: Self-pay

## 2020-08-08 VITALS — BP 120/78 | HR 102 | Temp 97.7°F | Resp 17 | Ht 63.0 in | Wt 141.6 lb

## 2020-08-08 DIAGNOSIS — G8929 Other chronic pain: Secondary | ICD-10-CM | POA: Diagnosis not present

## 2020-08-08 DIAGNOSIS — R631 Polydipsia: Secondary | ICD-10-CM

## 2020-08-08 DIAGNOSIS — R42 Dizziness and giddiness: Secondary | ICD-10-CM

## 2020-08-08 DIAGNOSIS — R5383 Other fatigue: Secondary | ICD-10-CM | POA: Diagnosis not present

## 2020-08-08 DIAGNOSIS — M25511 Pain in right shoulder: Secondary | ICD-10-CM | POA: Diagnosis not present

## 2020-08-08 DIAGNOSIS — R351 Nocturia: Secondary | ICD-10-CM

## 2020-08-08 DIAGNOSIS — K219 Gastro-esophageal reflux disease without esophagitis: Secondary | ICD-10-CM

## 2020-08-08 DIAGNOSIS — M25512 Pain in left shoulder: Secondary | ICD-10-CM | POA: Diagnosis not present

## 2020-08-08 LAB — CBC WITH DIFFERENTIAL/PLATELET
Basophils Absolute: 0 10*3/uL (ref 0.0–0.1)
Basophils Relative: 1.2 % (ref 0.0–3.0)
Eosinophils Absolute: 0.1 10*3/uL (ref 0.0–0.7)
Eosinophils Relative: 1.3 % (ref 0.0–5.0)
HCT: 34.6 % — ABNORMAL LOW (ref 36.0–46.0)
Hemoglobin: 11.8 g/dL — ABNORMAL LOW (ref 12.0–15.0)
Lymphocytes Relative: 33.3 % (ref 12.0–46.0)
Lymphs Abs: 1.3 10*3/uL (ref 0.7–4.0)
MCHC: 34.2 g/dL (ref 30.0–36.0)
MCV: 81.8 fl (ref 78.0–100.0)
Monocytes Absolute: 0.4 10*3/uL (ref 0.1–1.0)
Monocytes Relative: 10.4 % (ref 3.0–12.0)
Neutro Abs: 2.1 10*3/uL (ref 1.4–7.7)
Neutrophils Relative %: 53.8 % (ref 43.0–77.0)
Platelets: 292 10*3/uL (ref 150.0–400.0)
RBC: 4.23 Mil/uL (ref 3.87–5.11)
RDW: 13.2 % (ref 11.5–15.5)
WBC: 4 10*3/uL (ref 4.0–10.5)

## 2020-08-08 LAB — BASIC METABOLIC PANEL
BUN: 11 mg/dL (ref 6–23)
CO2: 30 mEq/L (ref 19–32)
Calcium: 9.6 mg/dL (ref 8.4–10.5)
Chloride: 102 mEq/L (ref 96–112)
Creatinine, Ser: 0.7 mg/dL (ref 0.40–1.20)
GFR: 88.27 mL/min (ref >60.00)
Glucose, Bld: 77 mg/dL (ref 70–99)
Potassium: 3.9 mEq/L (ref 3.5–5.1)
Sodium: 139 mEq/L (ref 135–145)

## 2020-08-08 LAB — VITAMIN D 25 HYDROXY (VIT D DEFICIENCY, FRACTURES): VITD: 22.76 ng/mL — ABNORMAL LOW (ref 30.00–100.00)

## 2020-08-08 LAB — HEMOGLOBIN A1C: Hgb A1c MFr Bld: 5.9 % (ref 4.6–6.5)

## 2020-08-08 LAB — HEPATIC FUNCTION PANEL
ALT: 11 U/L (ref 0–35)
AST: 13 U/L (ref 0–37)
Albumin: 4.1 g/dL (ref 3.5–5.2)
Alkaline Phosphatase: 61 U/L (ref 39–117)
Bilirubin, Direct: 0.1 mg/dL (ref 0.0–0.3)
Total Bilirubin: 0.5 mg/dL (ref 0.2–1.2)
Total Protein: 7.5 g/dL (ref 6.0–8.3)

## 2020-08-08 LAB — B12 AND FOLATE PANEL
Folate: 13.9 ng/mL (ref >5.9)
Vitamin B-12: 639 pg/mL (ref 211–911)

## 2020-08-08 LAB — POCT URINALYSIS DIPSTICK
Bilirubin, UA: 2
Blood, UA: 3
Glucose, UA: NEGATIVE
Ketones, UA: 5
Leukocytes, UA: NEGATIVE
Nitrite, UA: NEGATIVE
Protein, UA: POSITIVE — AB
Spec Grav, UA: 1.025 (ref 1.010–1.025)
Urobilinogen, UA: 0.2 E.U./dL
pH, UA: 6 (ref 5.0–8.0)

## 2020-08-08 LAB — TSH: TSH: 2.15 u[IU]/mL (ref 0.35–4.50)

## 2020-08-08 MED ORDER — CEPHALEXIN 500 MG PO CAPS: 500.0000 mg | ORAL_CAPSULE | Freq: Two times a day (BID) | ORAL | 0 refills | Status: AC

## 2020-08-08 MED ORDER — TIZANIDINE HCL 4 MG PO TABS
4.0000 mg | ORAL_TABLET | Freq: Three times a day (TID) | ORAL | 0 refills | Status: DC | PRN
Start: 2020-08-08 — End: 2021-05-20

## 2020-08-08 NOTE — Progress Notes (Signed)
Subjective:    Patient ID: Janet Pierce, female    DOB: 1951/03/21, 70 y.o.   MRN: [id number]  HPI Shoulder pain- bilateral, had sharp pain on L side from neck to chest this morning.  Using BenGay w/o relief.  This is ongoing issue for pt.    Dizziness- pt reports she had dizziness upon standing to use the restroom overnight.  Not her typical vertigo.  Not having dizziness presently.  Urinary Frequency- pt woke 6-7x last night to urinate.  She had dramatically increased her water intake yesterday.  No burning w/ uriation.  Increased thirst and dry mouth  Gas/bloating- ongoing issue for pt.  She admits she is only taking Protonix as needed rather than BID.    Fatigue- ongoing issue for pt, 'I have no stamina'   Review of Systems For ROS see HPI   This visit occurred during the SARS-CoV-2 public health emergency.  Safety protocols were in place, including screening questions prior to the visit, additional usage of staff PPE, and extensive cleaning of exam room while observing appropriate contact time as indicated for disinfecting solutions.       Objective:   Physical Exam Vitals reviewed.  Constitutional:      General: She is not in acute distress.    Appearance: Normal appearance. She is well-developed. She is not ill-appearing.  HENT:     Head: Normocephalic and atraumatic.  Eyes:     Conjunctiva/sclera: Conjunctivae normal.     Pupils: Pupils are equal, round, and reactive to light.  Neck:     Thyroid: No thyromegaly.     Comments: Bilateral trap spasm Cardiovascular:     Rate and Rhythm: Normal rate and regular rhythm.     Heart sounds: Normal heart sounds. No murmur heard.   Pulmonary:     Effort: Pulmonary effort is normal. No respiratory distress.     Breath sounds: Normal breath sounds.  Abdominal:     General: There is no distension.     Palpations: Abdomen is soft.     Tenderness: There is no abdominal tenderness.  Musculoskeletal:     Cervical back:  Normal range of motion. No rigidity.  Lymphadenopathy:     Cervical: No cervical adenopathy.  Skin:    General: Skin is warm and dry.  Neurological:     Mental Status: She is alert and oriented to person, place, and time.     Cranial Nerves: No cranial nerve deficit.     Coordination: Coordination normal.     Gait: Gait normal.  Psychiatric:        Behavior: Behavior normal.           Assessment & Plan:  Shoulder pain- ongoing issue for pt.  Bilateral.  At this time will refer to Ortho given chronicity of problem.  Start Tizanidine as needed for spasm.  Hold NSAIDs due to ongoing GI issues.  Tylenol prn.  Pt expressed understanding and is in agreement w/ plan.   Urinary frequency at night- suspect this was due to increased water intake but UA is suspicious for infxn.  Start Keflex and await cx results.  Pt expressed understanding and is in agreement w/ plan.   Increased thirst- check labs to r/o diabetes or other metabolic disturbance  Dizziness- pt is asymptomatic today.  Encouraged her to drink plenty of fluids and change positions slowly.  Will follow.  Fatigue- recurrent problem for pt.  Suspect this may be due to untreated anxiety as pt has frequent  and at times excessive concerns about her health.  Check labs to r/o metabolic cause.  Will follow.

## 2020-08-08 NOTE — Patient Instructions (Signed)
Follow up next month as scheduled We'll notify you of your lab results and make any changes if needed We'll call you with your Orthopedic appt for the shoulder pain Please call GI at [phone] Make sure you are taking your Pantoprazole twice daily for the gas and bloating Continue to drink plenty of water START the Cephalexin for possible UTI Make sure you change positions slowly to avoid dizziness Use the Tizanidine as needed for shoulder/neck spasm Call with any questions or concerns Hang in there!

## 2020-08-09 ENCOUNTER — Other Ambulatory Visit: Payer: Self-pay

## 2020-08-09 ENCOUNTER — Telehealth: Payer: Self-pay | Admitting: Family Medicine

## 2020-08-09 ENCOUNTER — Ambulatory Visit: Payer: Medicare Other | Admitting: Family Medicine

## 2020-08-09 MED ORDER — MECLIZINE HCL 25 MG PO TABS: 25.0000 mg | ORAL_TABLET | Freq: Three times a day (TID) | ORAL | 0 refills | Status: DC | PRN

## 2020-08-09 MED ORDER — VITAMIN D (ERGOCALCIFEROL) 1.25 MG (50000 UNIT) PO CAPS
50000.0000 [IU] | ORAL_CAPSULE | ORAL | 0 refills | Status: DC
Start: 2020-08-09 — End: 2021-04-04

## 2020-08-09 NOTE — Telephone Encounter (Signed)
Ok to send Meclizine 25mg , 1 tab po TID prn dizziness, #30.  This should not interact w/ the muscle relaxer

## 2020-08-09 NOTE — Telephone Encounter (Signed)
Needs to call Vitamin D into [facility] - Patient also woke up dizzy again and would like the vertigo medication sent to the same pharmacy.  Will the vertigo medication and the muscle spasm medication interract.

## 2020-08-09 NOTE — Telephone Encounter (Signed)
Called patient to inform of prescriptions. Patient voiced understanding. Meclizine and vitamin d sent to pharmacy.

## 2020-08-10 LAB — URINE CULTURE
MICRO NUMBER:: 11795159
Result:: NO GROWTH
SPECIMEN QUALITY:: ADEQUATE

## 2020-08-13 ENCOUNTER — Other Ambulatory Visit: Payer: Self-pay

## 2020-08-13 DIAGNOSIS — Z87448 Personal history of other diseases of urinary system: Secondary | ICD-10-CM

## 2020-08-17 ENCOUNTER — Ambulatory Visit: Payer: Self-pay

## 2020-08-17 ENCOUNTER — Encounter: Payer: Self-pay | Admitting: Orthopaedic Surgery

## 2020-08-17 ENCOUNTER — Ambulatory Visit: Payer: Medicare Other | Admitting: Orthopaedic Surgery

## 2020-08-17 ENCOUNTER — Other Ambulatory Visit: Payer: Self-pay

## 2020-08-17 VITALS — Ht 63.0 in | Wt 140.0 lb

## 2020-08-17 DIAGNOSIS — M542 Cervicalgia: Secondary | ICD-10-CM | POA: Diagnosis not present

## 2020-08-17 DIAGNOSIS — M25511 Pain in right shoulder: Secondary | ICD-10-CM

## 2020-08-17 DIAGNOSIS — G8929 Other chronic pain: Secondary | ICD-10-CM | POA: Diagnosis not present

## 2020-08-17 DIAGNOSIS — M25512 Pain in left shoulder: Secondary | ICD-10-CM | POA: Diagnosis not present

## 2020-08-17 MED ORDER — PREDNISONE 5 MG (21) PO TBPK: ORAL_TABLET | ORAL | 0 refills | Status: DC

## 2020-08-17 MED ORDER — DICLOFENAC SODIUM 75 MG PO TBEC: 75.0000 mg | DELAYED_RELEASE_TABLET | Freq: Two times a day (BID) | ORAL | 2 refills | Status: DC | PRN

## 2020-08-17 NOTE — Progress Notes (Signed)
Office Visit Note   [name]           Date of Birth: [birth year]           MRN: [id number] Visit Date: 08/17/2020              Requested by: Midge Minium, MD [address] [place],  [place] [address] PCP: Midge Minium, MD   Assessment & Plan: Visit Diagnoses:  1. Neck pain   2. Chronic pain of both shoulders     Plan: Impression is degenerative disc disease cervical spine.  I recommended a short course of steroids followed by anti-inflammatories as needed in addition to outpatient physical therapy for which the patient is amenable to.  She will follow-up with [place] as needed.  Call with concerns or questions in the meantime.  Follow-Up Instructions: Return if symptoms worsen or fail to improve.   Orders:  Orders Placed This Encounter  Procedures  . XR Cervical Spine 2 or 3 views  . Ambulatory referral to Physical Therapy   Meds ordered this encounter  Medications  . predniSONE (STERAPRED UNI-PAK 21 TAB) 5 MG (21) TBPK tablet    Sig: Take as directed    Dispense:  21 tablet    Refill:  0  . diclofenac (VOLTAREN) 75 MG EC tablet    Sig: Take 1 tablet (75 mg total) by mouth 2 (two) times daily as needed. May take as needed after finished with steroid taper    Dispense:  60 tablet    Refill:  2      Procedures: No procedures performed   Clinical Data: No additional findings.   Subjective: Chief Complaint  Patient presents with  . Right Shoulder - Pain  . Left Shoulder - Pain  . Neck - Pain    HPI patient is a very pleasant 70 year old female who comes in today with right-sided neck pain.  Pain she has is primarily to the right side of the neck and into the parascapular region.  Pain is worse first thing in the morning when her neck is most stiff.  She uses hot compresses and takes a hot shower which does seem to help.  She has tried changing her mattress and pillows without relief.  She has most recently been taking Mobic and  tizanidine without significant relief.  She denies any paresthesias to the right upper extremity.  No previous ESI to the neck.  Review of Systems as detailed in HPI.  All others reviewed and are negative.   Objective: Vital Signs: Ht 5\' 3"  (1.6 m)   Wt 140 lb (63.5 kg)   BMI 24.80 kg/m   Physical Exam well-developed and well-nourished female in no acute distress.  Alert and oriented x3.  Ortho Exam cervical spine exam reveals no spinous tenderness.  She has mild right-sided paraspinous tenderness as well as tenderness into the right parascapular region.  She is stiff in all planes with range of motion of the neck.  No focal weakness.  She is neurovascular intact distally.  Specialty Comments:  No specialty comments available.  Imaging: XR Cervical Spine 2 or 3 views  Result Date: 08/17/2020 Demonstrate moderate and diffuse degenerative disc disease and facet arthropathy.    PMFS History: Patient Active Problem List   Diagnosis Date Noted  . Age-related osteoporosis without current pathological fracture 09/28/2019  . Vitamin D deficiency 07/08/2018  . Benign paroxysmal positional vertigo 05/30/2015  . Bunion, right foot 06/01/2014  .  Tobacco abuse 11/25/2013  . Dysphagia 05/19/2013  . Special screening for malignant neoplasms, colon 05/19/2013  . GERD (gastroesophageal reflux disease) 04/04/2013  . Allergic rhinitis 04/04/2013  . General medical examination 09/11/2010  . PREMATURE VENTRICULAR CONTRACTIONS 11/06/2009  . SUPRAVENTRICULAR TACHYCARDIA 10/05/2009  . LEUKOCYTOPENIA UNSPECIFIED 09/10/2009  . DEPRESSIVE DISORDER 09/06/2009  . ECZEMA 09/06/2009  . PALPITATIONS, OCCASIONAL 06/08/2009  . SHOULDER PAIN, LEFT 05/24/2009  . BACK PAIN 05/24/2009  . SCOLIOSIS 05/24/2009  . LIPOMA 02/02/2008  . Hyperlipidemia 02/02/2008  . HYPERTENSION, BENIGN ESSENTIAL 02/02/2008   Past Medical History:  Diagnosis Date  . Arthritis   . Glaucoma   . Hyperlipidemia   .  Hypertension   . Retinal detachment    left eye prosthesis  . Scoliosis     Family History  Problem Relation Age of Onset  . Lung cancer Mother   . Hypertension Mother   . Bone cancer Mother   . Diabetes Maternal Grandmother   . Colon cancer Neg Hx     Past Surgical History:  Procedure Laterality Date  . CATARACT EXTRACTION    . EYE SURGERY Right    glucoma  . LIPOMA EXCISION    . RETINAL DETACHMENT SURGERY    . TONSILLECTOMY     Social History   Occupational History  . Occupation: Chemical engineer    Comment: [employer]  Tobacco Use  . Smoking status: Former Smoker    Packs/day: 0.25    Types: Cigarettes    Quit date: 11/23/2016    Years since quitting: 3.7  . Smokeless tobacco: Never Used  . Tobacco comment: stopped by using nicorette gum. has completely stopped  Vaping Use  . Vaping Use: Never used  Substance and Sexual Activity  . Alcohol use: Yes    Alcohol/week: 0.0 standard drinks    Comment: occasionally  . Drug use: No  . Sexual activity: Not on file

## 2020-08-27 ENCOUNTER — Other Ambulatory Visit: Payer: Self-pay | Admitting: Family Medicine

## 2020-08-27 DIAGNOSIS — K219 Gastro-esophageal reflux disease without esophagitis: Secondary | ICD-10-CM

## 2020-08-29 ENCOUNTER — Encounter: Payer: Self-pay | Admitting: Physical Therapy

## 2020-08-29 ENCOUNTER — Ambulatory Visit: Payer: Medicare Other | Attending: Physician Assistant | Admitting: Physical Therapy

## 2020-08-29 ENCOUNTER — Other Ambulatory Visit: Payer: Self-pay

## 2020-08-29 DIAGNOSIS — R252 Cramp and spasm: Secondary | ICD-10-CM

## 2020-08-29 DIAGNOSIS — M6281 Muscle weakness (generalized): Secondary | ICD-10-CM

## 2020-08-29 DIAGNOSIS — M542 Cervicalgia: Secondary | ICD-10-CM | POA: Diagnosis present

## 2020-08-29 NOTE — Therapy (Signed)
[place]. [place], [place], [address] Phone: [phone]   Fax:  [phone]  Physical Therapy Evaluation  Patient Details  Name: Janet Pierce MRN: [id number] Date of Birth: [birth year] Referring Provider (PT): Purcell Mouton Date: 08/29/2020   PT End of Session - 08/29/20 1149    Visit Number 1    Date for PT Re-Evaluation 11/21/20    PT Start Time 1100    PT Stop Time 1138    PT Time Calculation (min) 38 min    Activity Tolerance Patient tolerated treatment well    Behavior During Therapy [facility] for tasks assessed/performed           Past Medical History:  Diagnosis Date  . Arthritis   . Glaucoma   . Hyperlipidemia   . Hypertension   . Retinal detachment    left eye prosthesis  . Scoliosis     Past Surgical History:  Procedure Laterality Date  . CATARACT EXTRACTION    . EYE SURGERY Right    glucoma  . LIPOMA EXCISION    . RETINAL DETACHMENT SURGERY    . TONSILLECTOMY      There were no vitals filed for this visit.    Subjective Assessment - 08/29/20 1059    Subjective Pt presents to clinic reporting B neck and shoulder pain L>R which began years ago, worsening over the past few months. Has chronic LBP since she was a teenager, states hx of scoliosis. Pt xrays demo moderate degeneration in cervical spine. Pt had course of steroids which she says has improved some of the stiffness/pain that she was experiencing, especially in the mornings. Pt denies radiating pain into UEs and N/T in hands. Pt denies new HA or changes in vision. States turning head is primary aggravating factor.    Pertinent History scoliosis, HTN, arthritis, hyperlipedemia    Limitations Lifting;House hold activities    Diagnostic tests xrays cspine    Patient Stated Goals be able to do more walking, reduce pain in neck/back, be able to bowl/garden without pain    Currently in Pain? Yes    Pain Score 2     Pain Location Neck     Pain Orientation Right;Left    Pain Descriptors / Indicators Aching    Pain Type Chronic pain    Aggravating Factors  turning head when driving, twisting back to look behind, looking up    Pain Relieving Factors aleve, steroids, heating pad, hot shower              [facility] PT Assessment - 08/29/20 0001      Assessment   Medical Diagnosis cervical pain    Referring Provider (PT) Xu    Hand Dominance Right    Next MD Visit --   will call back as needed   Prior Therapy PT for back      Precautions   Precautions None      Restrictions   Weight Bearing Restrictions No      Balance Screen   Has the patient fallen in the past 6 months No    Has the patient had a decrease in activity level because of a fear of falling?  No    Is the patient reluctant to leave their home because of a fear of falling?  No      Home Environment   Additional Comments some housework/yardwork, has stairs reports no trouble with stairs      Prior  Function   Level of Independence Independent    Vocation Retired    Leisure gardening, bowling, walking, traveling, Engineer, mining Appears Intact      Posture/Postural Control   Posture/Postural Control Postural limitations    Postural Limitations Rounded Shoulders;Forward head      ROM / Strength   AROM / PROM / Strength AROM;Strength      AROM   Overall AROM Comments B shoulder AROM WFL; mild tightness L functional IR    AROM Assessment Site Cervical    Cervical Flexion 50    Cervical Extension 45    Cervical - Right Side Bend 20    Cervical - Left Side Bend 25    Cervical - Right Rotation 55    Cervical - Left Rotation 60      Strength   Overall Strength Comments BUE 5/5; weakness of scap stabilizers      Palpation   Spinal mobility hypomobility cervical spine    Palpation comment tender to palpation/reports of increased discomfort/tightness B UT/cervical/thoracic paraspinals      Special Tests    Special Tests  Cervical;Rotator Cuff Impingement    Cervical Tests Spurling's    Rotator Cuff Impingment tests Empty Can test      Spurling's   Findings Negative    Side Right    Comment neg B      Empty Can test   Findings Negative    Side Right    Comment neg B                      Objective measurements completed on examination: See above findings.               PT Education - 08/29/20 1149    Education Details Pt educated on POC and HEP    Person(s) Educated Patient    Methods Explanation;Demonstration;Handout    Comprehension Verbalized understanding;Returned demonstration            PT Short Term Goals - 08/29/20 1158      PT SHORT TERM GOAL #1   Title Pt will be independent with initial HEP    Time 2    Period Weeks    Status New    Target Date 09/12/20             PT Long Term Goals - 08/29/20 1158      PT LONG TERM GOAL #1   Title Pt will be I with advanced HEP    Time 6    Period Weeks    Status New    Target Date 10/10/20      PT LONG TERM GOAL #2   Title Pt will demo cervical AROM within 75% of normal range with no increase in cervical pain    Time 6    Period Weeks    Status New    Target Date 10/10/20      PT LONG TERM GOAL #3   Title Pt will report able to turn head to look behind/check lanes while driving with no increase in cervical pain    Time 6    Period Weeks    Status New    Target Date 10/10/20      PT LONG TERM GOAL #4   Title Pt will report 50% reduction in cervical pain    Time 6    Period Weeks    Status New  Target Date 10/10/20      PT LONG TERM GOAL #5   Title Pt will increase FOTO score to 65%    Baseline 58%    Time 6    Period Weeks    Status New    Target Date 10/10/20                  Plan - 08/29/20 1150    Clinical Impression Statement Pt presents to clinic with reports of chronic neck/UT pain present for years but worsening over the past few months, no known MOI. Pt has long  history of LBP d/t scoliosis diagnosed as a teenager. Xrays of cspine demo moderate degenerative changes. No MRI or injections. Does state course of prednisone helped. Pt demos decreased cervical AROM with increased pain with extension/lat flexion/rotation. Pt also demos weakness of scap stabilizers, FHP/rounded shoulders, and hypomobility of cervical spine. Denies N/T or radiating pain. Neg tests for impingment and neg Spurling's B. Pt would benefit from skilled PT to address the above impairments.    Personal Factors and Comorbidities Comorbidity 2    Comorbidities HTN, arthritis    Examination-Participation Restrictions Community Activity;Interpersonal Relationship;Driving    Stability/Clinical Decision Making Stable/Uncomplicated    Clinical Decision Making Low    Rehab Potential Good    PT Frequency 2x / week   pt requests 1x/week to start   PT Duration 6 weeks    PT Treatment/Interventions ADLs/Self Care Home Management;Electrical Stimulation;Iontophoresis 4mg /ml Dexamethasone;Moist Heat;Therapeutic exercise;Therapeutic activities;Patient/family education;Manual techniques;Dry needling;Passive range of motion;Taping    PT Next Visit Plan review/progress HEP, gentle progression to TE, cervical ex's and scap stab, manual to cervical, modalities as indicated    PT Home Exercise Plan see pt instructions    Consulted and Agree with Plan of Care Patient           Patient will benefit from skilled therapeutic intervention in order to improve the following deficits and impairments:  Decreased range of motion  Visit Diagnosis: Cervicalgia  Muscle weakness (generalized)  Cramp and spasm     Problem List Patient Active Problem List   Diagnosis Date Noted  . Age-related osteoporosis without current pathological fracture 09/28/2019  . Vitamin D deficiency 07/08/2018  . Benign paroxysmal positional vertigo 05/30/2015  . Bunion, right foot 06/01/2014  . Tobacco abuse 11/25/2013  .  Dysphagia 05/19/2013  . Special screening for malignant neoplasms, colon 05/19/2013  . GERD (gastroesophageal reflux disease) 04/04/2013  . Allergic rhinitis 04/04/2013  . General medical examination 09/11/2010  . PREMATURE VENTRICULAR CONTRACTIONS 11/06/2009  . SUPRAVENTRICULAR TACHYCARDIA 10/05/2009  . LEUKOCYTOPENIA UNSPECIFIED 09/10/2009  . DEPRESSIVE DISORDER 09/06/2009  . ECZEMA 09/06/2009  . PALPITATIONS, OCCASIONAL 06/08/2009  . SHOULDER PAIN, LEFT 05/24/2009  . BACK PAIN 05/24/2009  . SCOLIOSIS 05/24/2009  . LIPOMA 02/02/2008  . Hyperlipidemia 02/02/2008  . HYPERTENSION, BENIGN ESSENTIAL 02/02/2008   Amador Cunas, PT, DPT Donald Prose Elic Vencill 08/29/2020, 12:01 PM  [address]. [place], [place], [address] Phone: [phone]   Fax:  [phone]  Name: Janet Pierce MRN: [id number] Date of Birth: 12-May-1950

## 2020-08-29 NOTE — Patient Instructions (Signed)
Access Code: [email or web address] URL: [email or web address] Date: 08/29/2020 Prepared by: Amador Cunas  Exercises Seated Scapular Retraction - 1 x daily - 7 x weekly - 2 sets - 10 reps - 3 sec hold Seated Upper Trapezius Stretch - 1 x daily - 7 x weekly - 2 sets - 2 reps - 15-20 sec hold Seated Levator Scapulae Stretch - 1 x daily - 7 x weekly - 2 sets - 2 reps - 15-20 sec hold Seated Cervical Retraction - 1 x daily - 7 x weekly - 2 sets - 10 reps - 3 sec hold Seated Cervical Rotation AROM - 1 x daily - 7 x weekly - 2 sets - 10 reps

## 2020-09-06 ENCOUNTER — Ambulatory Visit: Payer: Medicare Other | Admitting: Physical Therapy

## 2020-09-07 ENCOUNTER — Other Ambulatory Visit: Payer: Self-pay

## 2020-09-07 ENCOUNTER — Ambulatory Visit: Payer: Medicare Other | Admitting: Family Medicine

## 2020-09-07 ENCOUNTER — Encounter: Payer: Self-pay | Admitting: Family Medicine

## 2020-09-07 VITALS — BP 115/70 | HR 101 | Temp 97.8°F | Resp 18 | Ht 63.0 in | Wt 143.8 lb

## 2020-09-07 DIAGNOSIS — G8929 Other chronic pain: Secondary | ICD-10-CM

## 2020-09-07 DIAGNOSIS — M25512 Pain in left shoulder: Secondary | ICD-10-CM

## 2020-09-07 DIAGNOSIS — M810 Age-related osteoporosis without current pathological fracture: Secondary | ICD-10-CM

## 2020-09-07 DIAGNOSIS — M25511 Pain in right shoulder: Secondary | ICD-10-CM

## 2020-09-07 DIAGNOSIS — Z1211 Encounter for screening for malignant neoplasm of colon: Secondary | ICD-10-CM

## 2020-09-07 MED ORDER — DENOSUMAB 60 MG/ML ~~LOC~~ SOSY
60.0000 mg | PREFILLED_SYRINGE | Freq: Once | SUBCUTANEOUS | Status: AC
  Administered 2020-09-07: 60 mg via SUBCUTANEOUS

## 2020-09-07 NOTE — Assessment & Plan Note (Signed)
Pt was given her first Prolia injxn today

## 2020-09-07 NOTE — Progress Notes (Signed)
Patient presents to the office foe her proila injection. Patient received 59ml of denosumab in her left arm and tolerated well. Patient will return in 6 month to receive her next prolia injection upon approval from her insurance.

## 2020-09-07 NOTE — Patient Instructions (Signed)
Schedule your complete physical in 3 months We'll call you with your GI appt Call with any questions or concerns Stay Safe!  Stay Healthy!! Have a great summer!!!

## 2020-09-07 NOTE — Progress Notes (Signed)
   Subjective:    Patient ID: Janet Pierce, female    DOB: 04-28-1950, 70 y.o.   MRN: [id number]  HPI Shoulder pain- bilateral.  Pt went to PT and was given home exercises.  Reports she is trying to do her neck stretches and exercises 1-2x/day and this is improving her pain.  Osteoporosis- doing Prolia today  Colon cancer screen- pt open to GI referral   Review of Systems For ROS see HPI   This visit occurred during the SARS-CoV-2 public health emergency.  Safety protocols were in place, including screening questions prior to the visit, additional usage of staff PPE, and extensive cleaning of exam room while observing appropriate contact time as indicated for disinfecting solutions.       Objective:   Physical Exam Vitals reviewed.  Constitutional:      General: She is not in acute distress.    Appearance: Normal appearance. She is not ill-appearing.  HENT:     Head: Normocephalic and atraumatic.  Pulmonary:     Effort: Pulmonary effort is normal. No respiratory distress.  Musculoskeletal:     Cervical back: Normal range of motion and neck supple. No rigidity.     Comments: Improved ROM of shoulders bilaterally  Skin:    General: Skin is warm and dry.  Neurological:     General: No focal deficit present.     Mental Status: She is alert and oriented to person, place, and time.  Psychiatric:        Mood and Affect: Mood normal.        Behavior: Behavior normal.        Thought Content: Thought content normal.           Assessment & Plan:  Chronic pain of both shoulders- improving w/ home exercise plan and PT instruction.  No changes at this time.  Will follow.

## 2020-09-14 ENCOUNTER — Other Ambulatory Visit: Payer: Self-pay

## 2020-09-14 ENCOUNTER — Ambulatory Visit: Payer: Medicare Other | Admitting: Physical Therapy

## 2020-09-14 ENCOUNTER — Encounter: Payer: Self-pay | Admitting: Physical Therapy

## 2020-09-14 DIAGNOSIS — M542 Cervicalgia: Secondary | ICD-10-CM | POA: Diagnosis not present

## 2020-09-14 DIAGNOSIS — M6281 Muscle weakness (generalized): Secondary | ICD-10-CM

## 2020-09-14 DIAGNOSIS — R252 Cramp and spasm: Secondary | ICD-10-CM
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
## 2020-09-18 ENCOUNTER — Ambulatory Visit: Payer: Medicare Other | Admitting: Gastroenterology

## 2020-09-18 ENCOUNTER — Encounter: Payer: Self-pay | Admitting: Gastroenterology

## 2020-09-18 ENCOUNTER — Other Ambulatory Visit (INDEPENDENT_AMBULATORY_CARE_PROVIDER_SITE_OTHER): Payer: Medicare Other

## 2020-09-18 VITALS — BP 100/68 | HR 92 | Ht 62.75 in | Wt 143.2 lb

## 2020-09-18 DIAGNOSIS — R1310 Dysphagia, unspecified: Secondary | ICD-10-CM | POA: Diagnosis not present

## 2020-09-18 DIAGNOSIS — K219 Gastro-esophageal reflux disease without esophagitis: Secondary | ICD-10-CM | POA: Diagnosis not present

## 2020-09-18 DIAGNOSIS — D649 Anemia, unspecified: Secondary | ICD-10-CM | POA: Diagnosis not present

## 2020-09-18 DIAGNOSIS — Z79899 Other long term (current) drug therapy: Secondary | ICD-10-CM | POA: Diagnosis not present

## 2020-09-18 DIAGNOSIS — Z1211 Encounter for screening for malignant neoplasm of colon: Secondary | ICD-10-CM

## 2020-09-18 LAB — IBC + FERRITIN
Ferritin: 31.8 ng/mL (ref 10.0–291.0)
Iron: 75 ug/dL (ref 42–145)
Saturation Ratios: 16.6 % — ABNORMAL LOW (ref 20.0–50.0)
Transferrin: 323 mg/dL (ref 212.0–360.0)

## 2020-09-18 MED ORDER — PLENVU 140 G PO SOLR: 1.0000 | Freq: Once | ORAL | 0 refills | Status: AC

## 2020-09-18 NOTE — Patient Instructions (Addendum)
If you are age 70 or older, your body mass index should be between 23-30. Your Body mass index is 25.58 kg/m. If this is out of the aforementioned range listed, please consider follow up with your Primary Care Provider.  If you are age 81 or younger, your body mass index should be between 19-25. Your Body mass index is 25.58 kg/m. If this is out of the aformentioned range listed, please consider follow up with your Primary Care Provider.   __________________________________________________________  The [facility] providers would like to encourage you to use [facility] to communicate with providers for non-urgent requests or questions.  Due to long hold times on the telephone, sending your provider a message by [facility] may be a faster and more efficient way to get a response.  Please allow 48 business hours for a response.  Please remember that this is for non-urgent requests.   You have been scheduled for an endoscopy and colonoscopy. Please follow the written instructions given to you at your visit today. Please pick up your prep supplies at the pharmacy within the next 1-3 days. If you use inhalers (even only as needed), please bring them with you on the day of your procedure.   Please go to the lab in the basement of our building to have lab work done as you leave today. Hit "B" for basement when you get on the elevator.  When the doors open the lab is on your left.  We will call you with the results. Thank you.  Due to recent changes in healthcare laws, you may see the results of your imaging and laboratory studies on MyChart before your provider has had a chance to review them.  We understand that in some cases there may be results that are confusing or concerning to you. Not all laboratory results come back in the same time frame and the provider may be waiting for multiple results in order to interpret others.  Please give [place] 48 hours in order for your provider to thoroughly review all the  results before contacting the office for clarification of your results.   Continue pantoprazole once daily and titrate as needed.  Thank you for entrusting me with your care and for choosing [facility], Dr. Guanica Cellar

## 2020-09-18 NOTE — Progress Notes (Signed)
HPI :  70 year old female with a history of GERD, HTN, HLD, referred by [name], MD for evaluation of GERD, dysphagia, colon cancer screening.  Patient has last been seen by our office by Dr. Deatra Ina in 2016.  She states she has had longstanding reflux symptoms.  She has symptoms of mainly discomfort in her mid to upper chest that comes and goes, belching often relieves it.  Orange juice often precipitate symptoms so she avoids that.  She has been on Protonix over the years for at least the past 6 years or so.  When she takes it it reliably helps take away the symptoms.  She normally takes it as needed however she has taken it every day for the past few weeks for persistent symptoms.  She states this is definitely helped but she continues to have some occasional breakthrough.  She has tried increasing it to twice daily dosing for the past few days which has helped.  She has been on Carafate in the past as well as Prilosec in the past which has not really helped.  She also has some dysphagia that she has had over time to particular foods, things can get hung up in her mid chest or so with swallows.  She denies any nausea or vomiting.  She does have a lot of bloating and gas that can bother her.  She moves her bowels about once to twice per day.  No constipation.  No blood in her stools.  She thinks her last colonoscopy was at age 42 or so.  She denies any polyps on her last exam that she is aware of.  No family history of colon cancer.  She does have a mild anemia on recent labs drawn February 2022.  Hemoglobin 11.8 with MCV of 81.8.  Her hemoglobin has been normal in recent years otherwise.  Normal B12 and folate done, no iron studies drawn.  She is never had a prior EGD.  Discussion of her reflux symptoms, she does endorse a history of osteoporosis and is on " injection therapy" for that.    Normal stress echo in 09/11/2015   Past Medical History:  Diagnosis Date  . Anxiety   . Arthritis    . GERD (gastroesophageal reflux disease)   . Glaucoma   . Hyperlipidemia   . Hypertension   . Retinal detachment    left eye prosthesis  . Scoliosis      Past Surgical History:  Procedure Laterality Date  . BUNIONECTOMY Left   . CATARACT EXTRACTION Right   . EYE SURGERY Right    glucoma  . LIPOMA EXCISION     hip  . RETINAL DETACHMENT SURGERY Left   . TONSILLECTOMY     Family History  Problem Relation Age of Onset  . Lung cancer Mother   . Hypertension Mother   . Bone cancer Mother   . Liver cancer Mother   . Diabetes Maternal Grandmother   . Other Sister        brain tumor  . Hypertension Brother   . Fibroids Daughter   . Colon cancer Neg Hx    Social History   Tobacco Use  . Smoking status: Former Smoker    Packs/day: 0.25    Types: Cigarettes    Quit date: 11/23/2016    Years since quitting: 3.8  . Smokeless tobacco: Never Used  . Tobacco comment: stopped by using nicorette gum. has completely stopped  Vaping Use  . Vaping Use: Never used  Substance Use Topics  . Alcohol use: Yes    Alcohol/week: 0.0 standard drinks    Comment: occasionally  . Drug use: No   Current Outpatient Medications  Medication Sig Dispense Refill  . amLODipine (NORVASC) 10 MG tablet TAKE 1 TABLET DAILY BEFORE BREAKFAST 90 tablet 3  . atorvastatin (LIPITOR) 20 MG tablet TAKE 1 TABLET DAILY 90 tablet 3  . brimonidine (ALPHAGAN P) 0.1 % SOLN Place 1 drop into the right eye in the morning and at bedtime.    . dorzolamide (TRUSOPT) 2 % ophthalmic solution INSTILL 1 DROP INTO BOTH EYES BID  1  . LUMIGAN 0.01 % SOLN 1 drop at bedtime.    . Multiple Vitamins-Minerals (MULTIVITAMIN GUMMIES WOMENS) CHEW Chew 2 Doses by mouth every morning.    . pantoprazole (PROTONIX) 40 MG tablet TAKE 1 TABLET TWICE A DAY 60 tablet 0  . PEG-KCl-NaCl-NaSulf-Na Asc-C (PLENVU) 140 g SOLR Take 1 kit by mouth once for 1 dose. 1 each 0  . timolol (TIMOPTIC) 0.5 % ophthalmic solution Place 1 drop into the  right eye 2 (two) times daily.     Marland Kitchen tiZANidine (ZANAFLEX) 4 MG tablet Take 1 tablet (4 mg total) by mouth every 8 (eight) hours as needed for muscle spasms. 30 tablet 0  . Vitamin D, Ergocalciferol, (DRISDOL) 1.25 MG (50000 UNIT) CAPS capsule Take 1 capsule (50,000 Units total) by mouth every 7 (seven) days. 12 capsule 0   No current facility-administered medications for this visit.   No Known Allergies   Review of Systems: All systems reviewed and negative except where noted in HPI.    Lab Results  Component Value Date   WBC 4.0 08/08/2020   HGB 11.8 (L) 08/08/2020   HCT 34.6 (L) 08/08/2020   MCV 81.8 08/08/2020   PLT 292.0 08/08/2020    Lab Results  Component Value Date   CREATININE 0.70 08/08/2020   BUN 11 08/08/2020   NA 139 08/08/2020   K 3.9 08/08/2020   CL 102 08/08/2020   CO2 30 08/08/2020    Lab Results  Component Value Date   ALT 11 08/08/2020   AST 13 08/08/2020   ALKPHOS 61 08/08/2020   BILITOT 0.5 08/08/2020    Physical Exam: BP 100/68 (BP Location: Left Arm, Patient Position: Sitting, Cuff Size: Normal)   Pulse 92   Ht 5' 2.75" (1.594 m) Comment: height measured without shoes  Wt 143 lb 4 oz (65 kg)   BMI 25.58 kg/m  Constitutional: Pleasant,well-developed, female in no acute distress. HEENT: Normocephalic and atraumatic. Conjunctivae are normal. No scleral icterus. Neck supple.  Cardiovascular: Normal rate, regular rhythm.  Pulmonary/chest: Effort normal and breath sounds normal.  Abdominal: Soft, nondistended, nontender.  There are no masses palpable.  Extremities: no edema Lymphadenopathy: No cervical adenopathy noted. Neurological: Alert and oriented to person place and time. Skin: Skin is warm and dry. No rashes noted. Psychiatric: Normal mood and affect. Behavior is normal.   ASSESSMENT AND PLAN: 70 year old female here for reassessment of the following issues:  GERD Dysphagia Long-term use of PPI Anemia Colon cancer  screening  As above longstanding symptoms of intermittent reflux which she has used PPI as needed, specifically Protonix.  This has worked fairly well to control symptoms however having to use it more frequently now still having some radicular symptoms in recent weeks.  She also has longstanding intermittent dysphagia to solids.  She had an EGD recommended years ago but never followed through with it.  We discussed  her history of reflux and dysphagia, I am recommending an upper endoscopy to further evaluate these issues.  I discussed risk benefits of anesthesia and endoscopy and she wants to proceed.  If she has a stricture we will plan on performing dilation.  We discussed long-term use of chronic PPIs, long-term want to use the lowest dose needed to control her symptoms.  She does have a history of osteoporosis and she is at increased risk for bone fracture and chronic PPI.  We will use EGD to make decisions about long-term management.  She will continue Protonix at this time, use lowest dose needed to control her symptoms.  Otherwise she has a mild anemia, will check iron studies to assess for iron deficiency.  She is otherwise due for a screening colonoscopy.  Discussed risk benefits, she wants to do optical colonoscopy at the same time as her upper endoscopy which is reasonable.  Further recommendations pending the results.  Plan: - schedule for EGD with possible dilation, and colonoscopy - TIBC / ferritin panel - counseled on long term PPI use - continue pantoprazole once daily, titrate up or down as needed, use lowest dose needed to control symptoms - further recommendations pending her course and results of her exams  [place] Cellar, MD [facility]

## 2020-09-19 ENCOUNTER — Ambulatory Visit: Payer: Medicare Other | Admitting: Physical Therapy

## 2020-09-20 ENCOUNTER — Ambulatory Visit: Payer: Medicare Other | Attending: Physician Assistant | Admitting: Physical Therapy

## 2020-09-20 ENCOUNTER — Other Ambulatory Visit: Payer: Self-pay

## 2020-09-20 DIAGNOSIS — M6281 Muscle weakness (generalized): Secondary | ICD-10-CM

## 2020-09-20 DIAGNOSIS — M542 Cervicalgia: Secondary | ICD-10-CM | POA: Diagnosis not present

## 2020-09-20 DIAGNOSIS — R252 Cramp and spasm: Secondary | ICD-10-CM | POA: Diagnosis present

## 2020-09-20 NOTE — Therapy (Signed)
[place]. [place], [place], [address] Phone: [phone]   Fax:  [phone]  Physical Therapy Treatment  Patient Details  Name: Janet Pierce MRN: [id number] Date of Birth: 1950-08-31 Referring Provider (PT): Purcell Mouton Date: 09/20/2020   PT End of Session - 09/20/20 1128    Visit Number 3    Date for PT Re-Evaluation 11/21/20    PT Start Time 1103    PT Stop Time 1145    PT Time Calculation (min) 42 min           Past Medical History:  Diagnosis Date  . Anxiety   . Arthritis   . GERD (gastroesophageal reflux disease)   . Glaucoma   . Hyperlipidemia   . Hypertension   . Retinal detachment    left eye prosthesis  . Scoliosis     Past Surgical History:  Procedure Laterality Date  . BUNIONECTOMY Left   . CATARACT EXTRACTION Right   . EYE SURGERY Right    glucoma  . LIPOMA EXCISION     hip  . RETINAL DETACHMENT SURGERY Left   . TONSILLECTOMY      There were no vitals filed for this visit.   Subjective Assessment - 09/20/20 1104    Subjective doing ex but still tight and painful    Currently in Pain? Yes    Pain Score 5     Pain Location Neck                             [facility] Adult PT Treatment/Exercise - 09/20/20 0001      Neck Exercises: Machines for Strengthening   UBE (Upper Arm Bike) L 2 2 min fwd / 2 min back    Cybex Row 20# 2 sets 10   cues to relax traps and full ROM   Lat Pull 20# 2 sets 10      Neck Exercises: Standing   Neck Retraction 15 reps;3 secs   head on ball on wall     Modalities   Modalities Moist Heat;Electrical Stimulation      Moist Heat Therapy   Number Minutes Moist Heat 15 Minutes    Moist Heat Location Cervical      Electrical Stimulation   Electrical Stimulation Location B UT/cervical    Electrical Stimulation Action IFC    Electrical Stimulation Parameters seated    Electrical Stimulation Goals Pain      Manual Therapy    Manual Therapy Soft tissue mobilization    Soft tissue mobilization bilateral upper traps            Trigger Point Dry Needling - 09/20/20 0001    Consent Given? Yes    Education Handout Provided Yes    Muscles Treated Head and Neck Upper trapezius    Upper Trapezius Response Twitch reponse elicited;Palpable increased muscle length                  PT Short Term Goals - 09/14/20 1058      PT SHORT TERM GOAL #1   Title Pt will be independent with initial HEP    Time 2    Period Weeks    Status Achieved    Target Date 09/12/20             PT Long Term Goals - 09/20/20 1133      PT LONG TERM GOAL #1  Title Pt will be I with advanced HEP    Status On-going      PT LONG TERM GOAL #2   Title Pt will demo cervical AROM within 75% of normal range with no increase in cervical pain    Baseline very tight and limited    Status On-going      PT LONG TERM GOAL #3   Title Pt will report able to turn head to look behind/check lanes while driving with no increase in cervical pain    Status Partially Met      PT LONG TERM GOAL #4   Title Pt will report 50% reduction in cervical pain    Status On-going                 Plan - 09/20/20 1132    Clinical Impression Statement pt arrived very tight and sore, verb doing HEP. focus session on relaxing traps with ex added DN with MH/estim           Patient will benefit from skilled therapeutic intervention in order to improve the following deficits and impairments:     Visit Diagnosis: Cervicalgia  Muscle weakness (generalized)  Cramp and spasm     Problem List Patient Active Problem List   Diagnosis Date Noted  . Osteoporosis 09/28/2019  . Vitamin D deficiency 07/08/2018  . Benign paroxysmal positional vertigo 05/30/2015  . Bunion, right foot 06/01/2014  . Tobacco abuse 11/25/2013  . Dysphagia 05/19/2013  . Special screening for malignant neoplasms, colon 05/19/2013  . GERD (gastroesophageal reflux  disease) 04/04/2013  . Allergic rhinitis 04/04/2013  . General medical examination 09/11/2010  . PREMATURE VENTRICULAR CONTRACTIONS 11/06/2009  . SUPRAVENTRICULAR TACHYCARDIA 10/05/2009  . LEUKOCYTOPENIA UNSPECIFIED 09/10/2009  . DEPRESSIVE DISORDER 09/06/2009  . ECZEMA 09/06/2009  . PALPITATIONS, OCCASIONAL 06/08/2009  . SHOULDER PAIN, LEFT 05/24/2009  . BACK PAIN 05/24/2009  . SCOLIOSIS 05/24/2009  . LIPOMA 02/02/2008  . Hyperlipidemia 02/02/2008  . HYPERTENSION, BENIGN ESSENTIAL 02/02/2008    Janet Pierce,ANGIE PTA 09/20/2020, 11:34 AM  [place]. [place], [place], [address] Phone: [phone]   Fax:  [phone]  Name: Janet Pierce MRN: [id number] Date of Birth: 1951-03-11

## 2020-09-20 NOTE — Patient Instructions (Signed)

## 2020-09-26 ENCOUNTER — Telehealth (INDEPENDENT_AMBULATORY_CARE_PROVIDER_SITE_OTHER): Payer: Medicare Other | Admitting: Family Medicine

## 2020-09-26 ENCOUNTER — Ambulatory Visit: Payer: Medicare Other | Admitting: Physical Therapy

## 2020-09-26 ENCOUNTER — Encounter: Payer: Self-pay | Admitting: Family Medicine

## 2020-09-26 VITALS — BP 125/79

## 2020-09-26 DIAGNOSIS — R519 Headache, unspecified: Secondary | ICD-10-CM | POA: Diagnosis not present

## 2020-09-26 MED ORDER — PREDNISONE 10 MG PO TABS: ORAL_TABLET | ORAL | 0 refills | Status: DC
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
## 2020-09-26 NOTE — Progress Notes (Signed)
I connected with  Janet Pierce on 09/26/20 by a video enabled telemedicine application and verified that I am speaking with the correct person using two identifiers.   I discussed the limitations of evaluation and management by telemedicine. The patient expressed understanding and agreed to proceed.

## 2020-09-27 ENCOUNTER — Encounter: Payer: Self-pay | Admitting: Physical Therapy

## 2020-09-27 ENCOUNTER — Other Ambulatory Visit: Payer: Self-pay

## 2020-09-27 ENCOUNTER — Ambulatory Visit: Payer: Medicare Other | Admitting: Physical Therapy

## 2020-09-27 DIAGNOSIS — M542 Cervicalgia: Secondary | ICD-10-CM | POA: Diagnosis not present

## 2020-09-27 DIAGNOSIS — M6281 Muscle weakness (generalized): Secondary | ICD-10-CM

## 2020-09-27 DIAGNOSIS — R252 Cramp and spasm: Secondary | ICD-10-CM

## 2020-09-27 NOTE — Therapy (Signed)
[place]. [place], [place], [address] Phone: [phone]   Fax:  [phone]  Physical Therapy Treatment  Patient Details  Name: Janet Pierce MRN: [id number] Date of Birth: [birth year] Referring Provider (PT): Purcell Mouton Date: 09/27/2020   PT End of Session - 09/27/20 1301     Visit Number 4    Date for PT Re-Evaluation 11/21/20    PT Start Time 1234    PT Stop Time 1301    PT Time Calculation (min) 27 min    Activity Tolerance Patient tolerated treatment well    Behavior During Therapy [facility] for tasks assessed/performed             Past Medical History:  Diagnosis Date   Anxiety    Arthritis    GERD (gastroesophageal reflux disease)    Glaucoma    Hyperlipidemia    Hypertension    Retinal detachment    left eye prosthesis   Scoliosis     Past Surgical History:  Procedure Laterality Date   BUNIONECTOMY Left    CATARACT EXTRACTION Right    EYE SURGERY Right    glucoma   LIPOMA EXCISION     hip   RETINAL DETACHMENT SURGERY Left    TONSILLECTOMY      There were no vitals filed for this visit.   Subjective Assessment - 09/27/20 1236     Subjective Pt reports that she had soreness followed by a headache for several days after dry needling last rx. Received prednisone from MD and states this has helped calm down pain; states no pain at beginning of session today. Does not want any more DN.    Currently in Pain? No/denies    Pain Score 0-No pain                               [facility] Adult PT Treatment/Exercise - 09/27/20 0001       Neck Exercises: Machines for Strengthening   UBE (Upper Arm Bike) L2 x 3 min each direction    Cybex Row 20# 2 sets 12    Lat Pull 20# 2 sets 12    Other Machines for Strengthening standing shoulder ext 5# 2x10      Neck Exercises: Standing   Other Standing Exercises shoulder flexion/extension/IR with 2# weight bar x10    Other Standing  Exercises ball on wall x10 with 5 sec hold; red scap stab 3 ways x5 B                      PT Short Term Goals - 09/14/20 1058       PT SHORT TERM GOAL #1   Title Pt will be independent with initial HEP    Time 2    Period Weeks    Status Achieved    Target Date 09/12/20               PT Long Term Goals - 09/20/20 1133       PT LONG TERM GOAL #1   Title Pt will be I with advanced HEP    Status On-going      PT LONG TERM GOAL #2   Title Pt will demo cervical AROM within 75% of normal range with no increase in cervical pain    Baseline very tight and limited    Status On-going  PT LONG TERM GOAL #3   Title Pt will report able to turn head to look behind/check lanes while driving with no increase in cervical pain    Status Partially Met      PT LONG TERM GOAL #4   Title Pt will report 50% reduction in cervical pain    Status On-going                   Plan - 09/27/20 1302     Clinical Impression Statement Pt arrived late for this rx so shortened session today. Request no DN; had increased soreness and verbs HA after last time. Pt did well with progression of interventions with no increase in neck pain with TE. Cues for full ROM and posture/form with seated rows/lat pulldowns. Discussed continuing at 1x/week for 3 more weeks with pt agreement.    PT Treatment/Interventions ADLs/Self Care Home Management;Electrical Stimulation;Iontophoresis 25m/ml Dexamethasone;Moist Heat;Therapeutic exercise;Therapeutic activities;Patient/family education;Manual techniques;Dry needling;Passive range of motion;Taping    PT Next Visit Plan progression of TE, cervical ex's and scap stab, manual to cervical, modalities as indicated    Consulted and Agree with Plan of Care Patient             Patient will benefit from skilled therapeutic intervention in order to improve the following deficits and impairments:     Visit Diagnosis: Cervicalgia  Muscle  weakness (generalized)  Cramp and spasm     Problem List Patient Active Problem List   Diagnosis Date Noted   Osteoporosis 09/28/2019   Vitamin D deficiency 07/08/2018   Benign paroxysmal positional vertigo 05/30/2015   Bunion, right foot 06/01/2014   Tobacco abuse 11/25/2013   Dysphagia 05/19/2013   Special screening for malignant neoplasms, colon 05/19/2013   GERD (gastroesophageal reflux disease) 04/04/2013   Allergic rhinitis 04/04/2013   General medical examination 09/11/2010   PREMATURE VENTRICULAR CONTRACTIONS 11/06/2009   SUPRAVENTRICULAR TACHYCARDIA 10/05/2009   LEUKOCYTOPENIA UNSPECIFIED 09/10/2009   DEPRESSIVE DISORDER 09/06/2009   ECZEMA 09/06/2009   PALPITATIONS, OCCASIONAL 06/08/2009   SHOULDER PAIN, LEFT 05/24/2009   BACK PAIN 05/24/2009   SCOLIOSIS 05/24/2009   LIPOMA 02/02/2008   Hyperlipidemia 02/02/2008   HYPERTENSION, BENIGN ESSENTIAL 02/02/2008   AAmador Cunas PT, DPT ADonald ProseSugg 09/27/2020, 1:05 PM  [place] [address]Phone: [phone]  Fax:  [phone] Name: Janet PixlerMRN: [id number] of Birth: [birth year]

## 2020-09-30 NOTE — Assessment & Plan Note (Signed)
Deteriorated.  Again encouraged her to take PPI on a scheduled basis rather than as needed.  She is to call GI if sxs don't improve

## 2020-10-03 ENCOUNTER — Ambulatory Visit: Payer: Medicare Other | Admitting: Physical Therapy

## 2020-10-05 ENCOUNTER — Ambulatory Visit: Payer: Medicare Other | Admitting: Physical Therapy

## 2020-10-05 ENCOUNTER — Other Ambulatory Visit: Payer: Self-pay

## 2020-10-05 ENCOUNTER — Encounter: Payer: Self-pay | Admitting: Physical Therapy

## 2020-10-05 DIAGNOSIS — M542 Cervicalgia: Secondary | ICD-10-CM

## 2020-10-05 DIAGNOSIS — R252 Cramp and spasm: Secondary | ICD-10-CM

## 2020-10-05 DIAGNOSIS — M6281 Muscle weakness (generalized): Secondary | ICD-10-CM

## 2020-10-05 NOTE — Therapy (Signed)
[place]. [place], [place], [address] Phone: [phone]   Fax:  [phone]  Physical Therapy Treatment  Patient Details  Name: Janet Pierce MRN: [id number] Date of Birth: [birth year] Referring Provider (PT): Erlinda Hong   Encounter Date: 10/05/2020   PT End of Session - 10/05/20 0927     Visit Number 5    Date for PT Re-Evaluation 11/21/20    PT Start Time 0847    PT Stop Time 0931    PT Time Calculation (min) 44 min    Activity Tolerance Patient tolerated treatment well    Behavior During Therapy [facility] for tasks assessed/performed             Past Medical History:  Diagnosis Date   Anxiety    Arthritis    GERD (gastroesophageal reflux disease)    Glaucoma    Hyperlipidemia    Hypertension    Retinal detachment    left eye prosthesis   Scoliosis     Past Surgical History:  Procedure Laterality Date   BUNIONECTOMY Left    CATARACT EXTRACTION Right    EYE SURGERY Right    glucoma   LIPOMA EXCISION     hip   RETINAL DETACHMENT SURGERY Left    TONSILLECTOMY      There were no vitals filed for this visit.   Subjective Assessment - 10/05/20 0850     Subjective Pt reports neck is feeling better, states stretches have been helpful. States not as stiff.    Currently in Pain? Yes    Pain Score 2     Pain Location Neck                               [facility] Adult PT Treatment/Exercise - 10/05/20 0001       Neck Exercises: Machines for Strengthening   UBE (Upper Arm Bike) L2 x 2 min each direction    Nustep L5 x 6 min    Cybex Row 20# 2 sets 12    Lat Pull 20# 2 sets 12    Other Machines for Strengthening standing shoulder ext 5# 2x10      Neck Exercises: Standing   Other Standing Exercises shoulder flexion/extension/IR with 2# weight bar x10      Moist Heat Therapy   Number Minutes Moist Heat 10 Minutes    Moist Heat Location Cervical      Electrical Stimulation    Electrical Stimulation Location L UT/cervical    Electrical Stimulation Action IFC    Electrical Stimulation Parameters seated    Electrical Stimulation Goals Pain      Neck Exercises: Stretches   Other Neck Stretches ball on wall x10 with 5 sec hold    Other Neck Stretches open book thoracic rotation standing x10 B                      PT Short Term Goals - 09/14/20 1058       PT SHORT TERM GOAL #1   Title Pt will be independent with initial HEP    Time 2    Period Weeks    Status Achieved    Target Date 09/12/20               PT Long Term Goals - 09/20/20 1133       PT LONG TERM GOAL #1   Title Pt will  be I with advanced HEP    Status On-going      PT LONG TERM GOAL #2   Title Pt will demo cervical AROM within 75% of normal range with no increase in cervical pain    Baseline very tight and limited    Status On-going      PT LONG TERM GOAL #3   Title Pt will report able to turn head to look behind/check lanes while driving with no increase in cervical pain    Status Partially Met      PT LONG TERM GOAL #4   Title Pt will report 50% reduction in cervical pain    Status On-going                   Plan - 10/05/20 3794     Clinical Impression Statement Pt tolerated progression of TE well today with no c/o increased pain with exercise. Pt reporting relief with stretches so incorporated more flexibility ex's this rx. Cues for posture/form with seated rows/lat pulldowns. Requested estim/heat at end with report of relief post.    PT Treatment/Interventions ADLs/Self Care Home Management;Electrical Stimulation;Iontophoresis 4mg /ml Dexamethasone;Moist Heat;Therapeutic exercise;Therapeutic activities;Patient/family education;Manual techniques;Dry needling;Passive range of motion;Taping    PT Next Visit Plan progression of TE, cervical ex's and scap stab, manual to cervical, modalities as indicated    Consulted and Agree with Plan of Care Patient              Patient will benefit from skilled therapeutic intervention in order to improve the following deficits and impairments:     Visit Diagnosis: Cervicalgia  Muscle weakness (generalized)  Cramp and spasm     Problem List Patient Active Problem List   Diagnosis Date Noted   Osteoporosis 09/28/2019   Vitamin D deficiency 07/08/2018   Benign paroxysmal positional vertigo 05/30/2015   Bunion, right foot 06/01/2014   Tobacco abuse 11/25/2013   Dysphagia 05/19/2013   Special screening for malignant neoplasms, colon 05/19/2013   GERD (gastroesophageal reflux disease) 04/04/2013   Allergic rhinitis 04/04/2013   General medical examination 09/11/2010   PREMATURE VENTRICULAR CONTRACTIONS 11/06/2009   SUPRAVENTRICULAR TACHYCARDIA 10/05/2009   LEUKOCYTOPENIA UNSPECIFIED 09/10/2009   DEPRESSIVE DISORDER 09/06/2009   ECZEMA 09/06/2009   PALPITATIONS, OCCASIONAL 06/08/2009   SHOULDER PAIN, LEFT 05/24/2009   BACK PAIN 05/24/2009   SCOLIOSIS 05/24/2009   LIPOMA 02/02/2008   Hyperlipidemia 02/02/2008   HYPERTENSION, BENIGN ESSENTIAL 02/02/2008   Amador Cunas, PT, DPT Donald Prose Bettejane Leavens 10/05/2020, 9:30 AM  [place]. [place], [place], [address] Phone: [phone]   Fax:  [phone]  Name: Janet Pierce MRN: [id number] Date of Birth: 07-Feb-1951

## 2020-10-09 ENCOUNTER — Encounter: Payer: Self-pay | Admitting: Physical Therapy

## 2020-10-09 ENCOUNTER — Other Ambulatory Visit: Payer: Self-pay

## 2020-10-09 ENCOUNTER — Ambulatory Visit: Payer: Medicare Other | Admitting: Physical Therapy

## 2020-10-09 DIAGNOSIS — M6281 Muscle weakness (generalized): Secondary | ICD-10-CM

## 2020-10-09 DIAGNOSIS — M542 Cervicalgia: Secondary | ICD-10-CM | POA: Diagnosis not present

## 2020-10-09 DIAGNOSIS — R252 Cramp and spasm: Secondary | ICD-10-CM

## 2020-10-09 NOTE — Therapy (Signed)
[place]. [place], [place], [address] Phone: [phone]   Fax:  [phone]  Physical Therapy Treatment  Patient Details  Name: Janet Pierce MRN: [id number] Date of Birth: [birth year] Referring Provider (PT): Purcell Mouton Date: 10/09/2020   PT End of Session - 10/09/20 1254     Visit Number 6    Date for PT Re-Evaluation 11/21/20    PT Start Time 1217    PT Stop Time 1301    PT Time Calculation (min) 44 min    Activity Tolerance Patient tolerated treatment well    Behavior During Therapy [facility] for tasks assessed/performed             Past Medical History:  Diagnosis Date   Anxiety    Arthritis    GERD (gastroesophageal reflux disease)    Glaucoma    Hyperlipidemia    Hypertension    Retinal detachment    left eye prosthesis   Scoliosis     Past Surgical History:  Procedure Laterality Date   BUNIONECTOMY Left    CATARACT EXTRACTION Right    EYE SURGERY Right    glucoma   LIPOMA EXCISION     hip   RETINAL DETACHMENT SURGERY Left    TONSILLECTOMY      There were no vitals filed for this visit.   Subjective Assessment - 10/09/20 1227     Subjective States neck was pretty stiff this morning; feeling better after Tylenol and a heating pad    Currently in Pain? Yes    Pain Score 3     Pain Location Neck                               [facility] Adult PT Treatment/Exercise - 10/09/20 0001       Neck Exercises: Machines for Strengthening   UBE (Upper Arm Bike) L2 x 3 min each    Nustep L5 x 5 min    Cybex Row 20# 2 sets 12    Lat Pull 20# 2 sets 12    Other Machines for Strengthening standing shoulder ext 5# 2x10      Neck Exercises: Standing   Other Standing Exercises shoulder flexion/extension/IR with 3# weight bar x10    Other Standing Exercises ball on wall x10 with 5 sec hold      Moist Heat Therapy   Number Minutes Moist Heat 10 Minutes    Moist Heat  Location Cervical      Electrical Stimulation   Electrical Stimulation Location L UT/cervical    Electrical Stimulation Action IFC    Electrical Stimulation Parameters seated    Electrical Stimulation Goals Pain                      PT Short Term Goals - 09/14/20 1058       PT SHORT TERM GOAL #1   Title Pt will be independent with initial HEP    Time 2    Period Weeks    Status Achieved    Target Date 09/12/20               PT Long Term Goals - 09/20/20 1133       PT LONG TERM GOAL #1   Title Pt will be I with advanced HEP    Status On-going      PT LONG TERM GOAL #2  Title Pt will demo cervical AROM within 75% of normal range with no increase in cervical pain    Baseline very tight and limited    Status On-going      PT LONG TERM GOAL #3   Title Pt will report able to turn head to look behind/check lanes while driving with no increase in cervical pain    Status Partially Met      PT LONG TERM GOAL #4   Title Pt will report 50% reduction in cervical pain    Status On-going                   Plan - 10/09/20 1255     Clinical Impression Statement Pt tolerated progression of TE well with no c/o increased pain with exercise. Pt states relief of stiffness following exercise. Cues for posture/form to avoid postural sway with seated rows and shoulder elevation with standing shoulder extensions. Requested estim/heat at end.    PT Treatment/Interventions ADLs/Self Care Home Management;Electrical Stimulation;Iontophoresis 68m/ml Dexamethasone;Moist Heat;Therapeutic exercise;Therapeutic activities;Patient/family education;Manual techniques;Dry needling;Passive range of motion;Taping    PT Next Visit Plan progression of TE, cervical ex's and scap stab, manual to cervical, modalities as indicated    Consulted and Agree with Plan of Care Patient             Patient will benefit from skilled therapeutic intervention in order to improve the  following deficits and impairments:     Visit Diagnosis: Cervicalgia  Muscle weakness (generalized)  Cramp and spasm     Problem List Patient Active Problem List   Diagnosis Date Noted   Osteoporosis 09/28/2019   Vitamin D deficiency 07/08/2018   Benign paroxysmal positional vertigo 05/30/2015   Bunion, right foot 06/01/2014   Tobacco abuse 11/25/2013   Dysphagia 05/19/2013   Special screening for malignant neoplasms, colon 05/19/2013   GERD (gastroesophageal reflux disease) 04/04/2013   Allergic rhinitis 04/04/2013   General medical examination 09/11/2010   PREMATURE VENTRICULAR CONTRACTIONS 11/06/2009   SUPRAVENTRICULAR TACHYCARDIA 10/05/2009   LEUKOCYTOPENIA UNSPECIFIED 09/10/2009   DEPRESSIVE DISORDER 09/06/2009   ECZEMA 09/06/2009   PALPITATIONS, OCCASIONAL 06/08/2009   SHOULDER PAIN, LEFT 05/24/2009   BACK PAIN 05/24/2009   SCOLIOSIS 05/24/2009   LIPOMA 02/02/2008   Hyperlipidemia 02/02/2008   HYPERTENSION, BENIGN ESSENTIAL 02/02/2008   AAmador Cunas PT, DPT ADonald ProseSugg 10/09/2020, 12:56 PM  [place] [address]Phone: [phone]  Fax:  [phone] Name: Janet HashemiMRN: [id number] of Birth: [birth year]

## 2020-10-15 ENCOUNTER — Telehealth: Payer: Self-pay | Admitting: Family Medicine

## 2020-10-15 NOTE — Telephone Encounter (Signed)
Gas-X if she already has symptoms and Bean-O to prevent gas before meals

## 2020-10-15 NOTE — Telephone Encounter (Signed)
Patient would like to know what Dr. Birdie Riddle recommends for gas/bloating - please advise

## 2020-10-15 NOTE — Telephone Encounter (Signed)
Called patient with pcp recommendations. Patient voiced understanding. 

## 2020-10-16 ENCOUNTER — Ambulatory Visit: Payer: Medicare Other | Admitting: Physical Therapy

## 2020-10-16 ENCOUNTER — Encounter: Payer: Self-pay | Admitting: Physical Therapy

## 2020-10-16 ENCOUNTER — Other Ambulatory Visit: Payer: Self-pay

## 2020-10-16 DIAGNOSIS — R252 Cramp and spasm: Secondary | ICD-10-CM

## 2020-10-16 DIAGNOSIS — M542 Cervicalgia: Secondary | ICD-10-CM

## 2020-10-16 DIAGNOSIS — M6281 Muscle weakness (generalized): Secondary | ICD-10-CM

## 2020-10-16 NOTE — Therapy (Signed)
[place]. [place], [place], [address] Phone: [phone]   Fax:  [phone]  Physical Therapy Treatment  Patient Details  Name: Janet Pierce MRN: [id number] Date of Birth: Jun 13, 1950 Referring Provider (PT): Erlinda Hong   Encounter Date: 10/16/2020   PT End of Session - 10/16/20 1255     Visit Number 7    Date for PT Re-Evaluation 11/21/20    PT Start Time 1218    PT Stop Time 1300    PT Time Calculation (min) 42 min    Activity Tolerance Patient tolerated treatment well;Patient limited by pain    Behavior During Therapy [facility] for tasks assessed/performed             Past Medical History:  Diagnosis Date   Anxiety    Arthritis    GERD (gastroesophageal reflux disease)    Glaucoma    Hyperlipidemia    Hypertension    Retinal detachment    left eye prosthesis   Scoliosis     Past Surgical History:  Procedure Laterality Date   BUNIONECTOMY Left    CATARACT EXTRACTION Right    EYE SURGERY Right    glucoma   LIPOMA EXCISION     hip   RETINAL DETACHMENT SURGERY Left    TONSILLECTOMY      There were no vitals filed for this visit.   Subjective Assessment - 10/16/20 1219     Subjective Pt states that she is having increased stiffness/aching after sleeping on stomach all night    Currently in Pain? Yes    Pain Score 8     Pain Location Neck                               [facility] Adult PT Treatment/Exercise - 10/16/20 0001       Neck Exercises: Machines for Strengthening   UBE (Upper Arm Bike) L2 x 3 min each    Cybex Row 20# 2 sets 12   cues to reduce shoulder elevation   Lat Pull 20# 2 sets 12    Other Machines for Strengthening standing shoulder ext 5# 2x10      Moist Heat Therapy   Number Minutes Moist Heat 12 Minutes    Moist Heat Location Cervical      Electrical Stimulation   Electrical Stimulation Location R UT/cervical    Electrical Stimulation Action IFC     Electrical Stimulation Parameters seated    Electrical Stimulation Goals Pain      Neck Exercises: Stretches   Other Neck Stretches ball on wall x10 with 5 sec hold                      PT Short Term Goals - 09/14/20 1058       PT SHORT TERM GOAL #1   Title Pt will be independent with initial HEP    Time 2    Period Weeks    Status Achieved    Target Date 09/12/20               PT Long Term Goals - 09/20/20 1133       PT LONG TERM GOAL #1   Title Pt will be I with advanced HEP    Status On-going      PT LONG TERM GOAL #2   Title Pt will demo cervical AROM within 75% of normal range with  no increase in cervical pain    Baseline very tight and limited    Status On-going      PT LONG TERM GOAL #3   Title Pt will report able to turn head to look behind/check lanes while driving with no increase in cervical pain    Status Partially Met      PT LONG TERM GOAL #4   Title Pt will report 50% reduction in cervical pain    Status On-going                   Plan - 10/16/20 1255     Clinical Impression Statement Pt presents to clinic with reports of increased B UT/cervical pain this rx. Rates pain at 8/10. Able to tolerate some machine interventions but requesting heat/estim at end of session for pain relief. Cues to reduce shoulder elevation with exercise along with increased ROM with machine interventions. Discussed decreasing frequency to 1x/week.    PT Treatment/Interventions ADLs/Self Care Home Management;Electrical Stimulation;Iontophoresis 4mg /ml Dexamethasone;Moist Heat;Therapeutic exercise;Therapeutic activities;Patient/family education;Manual techniques;Dry needling;Passive range of motion;Taping    PT Next Visit Plan progression of TE, cervical ex's and scap stab, manual to cervical, modalities as indicated    Consulted and Agree with Plan of Care Patient             Patient will benefit from skilled therapeutic intervention in order to  improve the following deficits and impairments:     Visit Diagnosis: Cervicalgia  Muscle weakness (generalized)  Cramp and spasm     Problem List Patient Active Problem List   Diagnosis Date Noted   Osteoporosis 09/28/2019   Vitamin D deficiency 07/08/2018   Benign paroxysmal positional vertigo 05/30/2015   Bunion, right foot 06/01/2014   Tobacco abuse 11/25/2013   Dysphagia 05/19/2013   Special screening for malignant neoplasms, colon 05/19/2013   GERD (gastroesophageal reflux disease) 04/04/2013   Allergic rhinitis 04/04/2013   General medical examination 09/11/2010   PREMATURE VENTRICULAR CONTRACTIONS 11/06/2009   SUPRAVENTRICULAR TACHYCARDIA 10/05/2009   LEUKOCYTOPENIA UNSPECIFIED 09/10/2009   DEPRESSIVE DISORDER 09/06/2009   ECZEMA 09/06/2009   PALPITATIONS, OCCASIONAL 06/08/2009   SHOULDER PAIN, LEFT 05/24/2009   BACK PAIN 05/24/2009   SCOLIOSIS 05/24/2009   LIPOMA 02/02/2008   Hyperlipidemia 02/02/2008   HYPERTENSION, BENIGN ESSENTIAL 02/02/2008   Amador Cunas, PT, DPT Donald Prose Niccolas Loeper 10/16/2020, 12:57 PM  [place]. [place], [place], [address] Phone: [phone]   Fax:  [phone]  Name: Janet Pierce MRN: [id number] Date of Birth: [birth year]

## 2020-10-17 ENCOUNTER — Encounter: Payer: Self-pay | Admitting: *Deleted

## 2020-10-17 ENCOUNTER — Telehealth: Payer: Self-pay | Admitting: Gastroenterology

## 2020-10-17 NOTE — Telephone Encounter (Signed)
Attempted to reach patient, her vm is full at this time. Unable to leave a voicemail.

## 2020-10-17 NOTE — Telephone Encounter (Signed)
Spoke with patient, she states that she has been dealing with gas and bloating for the last couple of months. Patient states that she tried Gas-X, yesterday she took one in the AM and one in the PM with no relief. She has taken 1 Gas-x pill today also. She states that her last BM was yesterday and she is passing gas but not much. She reports having gas pains that are relieved after she passes gas. She states that this happened to her before and Sucralfate tablets worked for her, she states that she was taking it QID and would like a prescription. Advised patient that she should avoid foods that will cause gas and may want to try cutting out dairy. She states that she has been eating a lot of onions, broccoli, yogurt and milk. Please advise, thanks.

## 2020-10-17 NOTE — Telephone Encounter (Signed)
Thanks [employer]. If she is having some constipation at baseline it could be related to the bloating, would recommend Miralax daily and titrate up as needed. If no baseline constipation, would try giving her some information about a low FODMAP diet, wonder if something she is eating in her diet could be leading to this, and low FODMAP could help eliminate foods that are high risk to produce gas. She can follow up with me in the office about this otherwise as needed. Thanks

## 2020-10-18 NOTE — Telephone Encounter (Signed)
Spoke with patient in regards to Dr. Doyne Keel recommendations. Patient states that she is worried because she has had the bloating for so long and no relief. Patient states that she feels like she is passing bowel movements normally. Advised that she may still have some constipation, advised that she can try starting with 1 capful of Miralax daily, continue Gas-X and try low FODMAP diet. Advised patient that I will send the FODMAP diet to her My chart for her review. Patient wanted to schedule an appt prior to her EGD/colonoscopy. Patient has been scheduled for a follow up with Nicoletta Ba, PA-C on Thursday, 11/15/20 at 3 PM. Patient verbalized understanding and had no concerns at the end of the call.

## 2020-10-26 ENCOUNTER — Other Ambulatory Visit: Payer: Self-pay

## 2020-10-26 ENCOUNTER — Encounter: Payer: Self-pay | Admitting: Physical Therapy

## 2020-10-26 ENCOUNTER — Ambulatory Visit: Payer: Medicare Other | Attending: Physician Assistant | Admitting: Physical Therapy

## 2020-10-26 DIAGNOSIS — M542 Cervicalgia: Secondary | ICD-10-CM | POA: Insufficient documentation

## 2020-10-26 DIAGNOSIS — R252 Cramp and spasm: Secondary | ICD-10-CM | POA: Diagnosis present

## 2020-10-26 DIAGNOSIS — M6281 Muscle weakness (generalized): Secondary | ICD-10-CM | POA: Insufficient documentation

## 2020-10-26 NOTE — Therapy (Signed)
[place]. [place], [place], [address] Phone: [phone]   Fax:  [phone]  Physical Therapy Treatment  Patient Details  Name: Falynn Ailey MRN: [id number] Date of Birth: Feb 22, 1951 Referring Provider (PT): Purcell Mouton Date: 10/26/2020   PT End of Session - 10/26/20 0926     Visit Number 8    Date for PT Re-Evaluation 11/21/20    PT Start Time 0855    PT Stop Time 0931    PT Time Calculation (min) 36 min    Activity Tolerance Patient tolerated treatment well;Patient limited by pain    Behavior During Therapy [facility] for tasks assessed/performed             Past Medical History:  Diagnosis Date   Anxiety    Arthritis    GERD (gastroesophageal reflux disease)    Glaucoma    Hyperlipidemia    Hypertension    Retinal detachment    left eye prosthesis   Scoliosis     Past Surgical History:  Procedure Laterality Date   BUNIONECTOMY Left    CATARACT EXTRACTION Right    EYE SURGERY Right    glucoma   LIPOMA EXCISION     hip   RETINAL DETACHMENT SURGERY Left    TONSILLECTOMY      There were no vitals filed for this visit.   Subjective Assessment - 10/26/20 0901     Subjective Pt states that her neck is very sore this morning after doing a lot of work around the house yesterday    Currently in Pain? Yes    Pain Score 4     Pain Location Neck    Pain Orientation Right;Left                               [facility] Adult PT Treatment/Exercise - 10/26/20 0001       Neck Exercises: Machines for Strengthening   UBE (Upper Arm Bike) L2 x 3 min each      Moist Heat Therapy   Number Minutes Moist Heat 10 Minutes    Moist Heat Location Cervical      Electrical Stimulation   Electrical Stimulation Location B UT/cervical    Electrical Stimulation Action IFC    Electrical Stimulation Parameters seated    Electrical Stimulation Goals Pain      Manual Therapy   Manual Therapy  Soft tissue mobilization    Soft tissue mobilization bilateral upper traps                      PT Short Term Goals - 09/14/20 1058       PT SHORT TERM GOAL #1   Title Pt will be independent with initial HEP    Time 2    Period Weeks    Status Achieved    Target Date 09/12/20               PT Long Term Goals - 09/20/20 1133       PT LONG TERM GOAL #1   Title Pt will be I with advanced HEP    Status On-going      PT LONG TERM GOAL #2   Title Pt will demo cervical AROM within 75% of normal range with no increase in cervical pain    Baseline very tight and limited    Status On-going  PT LONG TERM GOAL #3   Title Pt will report able to turn head to look behind/check lanes while driving with no increase in cervical pain    Status Partially Met      PT LONG TERM GOAL #4   Title Pt will report 50% reduction in cervical pain    Status On-going                   Plan - 10/26/20 0927     Clinical Impression Statement Pt arrived ~10 min late this rx. Presents to clinic reporting increased cervical pain this morning. Requesting manual tx. Focused session on STM and trigger point release to B UT. Pt reporting pain relief after manual therapy. Estim/heat at end of session for pain.    PT Treatment/Interventions ADLs/Self Care Home Management;Electrical Stimulation;Iontophoresis 4mg /ml Dexamethasone;Moist Heat;Therapeutic exercise;Therapeutic activities;Patient/family education;Manual techniques;Dry needling;Passive range of motion;Taping    PT Next Visit Plan progression of TE, cervical ex's and scap stab, manual to cervical, modalities as indicated    Consulted and Agree with Plan of Care Patient             Patient will benefit from skilled therapeutic intervention in order to improve the following deficits and impairments:     Visit Diagnosis: Cervicalgia  Muscle weakness (generalized)  Cramp and spasm     Problem List Patient Active  Problem List   Diagnosis Date Noted   Osteoporosis 09/28/2019   Vitamin D deficiency 07/08/2018   Benign paroxysmal positional vertigo 05/30/2015   Bunion, right foot 06/01/2014   Tobacco abuse 11/25/2013   Dysphagia 05/19/2013   Special screening for malignant neoplasms, colon 05/19/2013   GERD (gastroesophageal reflux disease) 04/04/2013   Allergic rhinitis 04/04/2013   General medical examination 09/11/2010   PREMATURE VENTRICULAR CONTRACTIONS 11/06/2009   SUPRAVENTRICULAR TACHYCARDIA 10/05/2009   LEUKOCYTOPENIA UNSPECIFIED 09/10/2009   DEPRESSIVE DISORDER 09/06/2009   ECZEMA 09/06/2009   PALPITATIONS, OCCASIONAL 06/08/2009   SHOULDER PAIN, LEFT 05/24/2009   BACK PAIN 05/24/2009   SCOLIOSIS 05/24/2009   LIPOMA 02/02/2008   Hyperlipidemia 02/02/2008   HYPERTENSION, BENIGN ESSENTIAL 02/02/2008   Amador Cunas, PT, DPT Donald Prose Lisa Blakeman 10/26/2020, 9:29 AM  [place]. [place], [place], [address] Phone: [phone]   Fax:  [phone]  Name: Tove Wideman MRN: [id number] Date of Birth: 03-21-1951

## 2020-10-30 ENCOUNTER — Ambulatory Visit: Payer: Medicare Other | Admitting: Physical Therapy

## 2020-10-30 ENCOUNTER — Encounter: Payer: Self-pay | Admitting: Physical Therapy

## 2020-10-30 ENCOUNTER — Other Ambulatory Visit: Payer: Self-pay

## 2020-10-30 DIAGNOSIS — R252 Cramp and spasm: Secondary | ICD-10-CM

## 2020-10-30 DIAGNOSIS — M542 Cervicalgia: Secondary | ICD-10-CM

## 2020-10-30 DIAGNOSIS — M6281 Muscle weakness (generalized): Secondary | ICD-10-CM

## 2020-10-30 NOTE — Therapy (Signed)
[place]. [place], [place], [address] Phone: [phone]   Fax:  [phone]  Physical Therapy Treatment  Patient Details  Name: Janet Pierce MRN: [id number] Date of Birth: [birth year] Referring Provider (PT): Erlinda Hong   Encounter Date: 10/30/2020   PT End of Session - 10/30/20 1336     Visit Number 9    Date for PT Re-Evaluation 11/21/20    PT Start Time 1301    PT Stop Time 1346    PT Time Calculation (min) 45 min    Activity Tolerance Patient tolerated treatment well    Behavior During Therapy [facility] for tasks assessed/performed             Past Medical History:  Diagnosis Date   Anxiety    Arthritis    GERD (gastroesophageal reflux disease)    Glaucoma    Hyperlipidemia    Hypertension    Retinal detachment    left eye prosthesis   Scoliosis     Past Surgical History:  Procedure Laterality Date   BUNIONECTOMY Left    CATARACT EXTRACTION Right    EYE SURGERY Right    glucoma   LIPOMA EXCISION     hip   RETINAL DETACHMENT SURGERY Left    TONSILLECTOMY      There were no vitals filed for this visit.   Subjective Assessment - 10/30/20 1305     Subjective Pt states that neck is feeling better today; improved since massage last rx.    Currently in Pain? Yes    Pain Score 1     Pain Location Neck    Pain Orientation Right;Left                               [facility] Adult PT Treatment/Exercise - 10/30/20 0001       Neck Exercises: Machines for Strengthening   UBE (Upper Arm Bike) L2 x 3 min each    Cybex Row 20# 2 sets 12    Lat Pull 20# 2 sets 12    Other Machines for Strengthening standing shoulder ext 5# 2x10    Other Machines for Strengthening 20# triceps 2x10; 10# biceps 2x10      Moist Heat Therapy   Number Minutes Moist Heat 10 Minutes    Moist Heat Location Cervical      Electrical Stimulation   Electrical Stimulation Location B UT/cervical    Electrical  Stimulation Action IFC    Electrical Stimulation Parameters seated    Electrical Stimulation Goals Pain      Manual Therapy   Manual Therapy Soft tissue mobilization    Soft tissue mobilization bilateral upper traps                      PT Short Term Goals - 09/14/20 1058       PT SHORT TERM GOAL #1   Title Pt will be independent with initial HEP    Time 2    Period Weeks    Status Achieved    Target Date 09/12/20               PT Long Term Goals - 09/20/20 1133       PT LONG TERM GOAL #1   Title Pt will be I with advanced HEP    Status On-going      PT LONG TERM GOAL #2   Title  Pt will demo cervical AROM within 75% of normal range with no increase in cervical pain    Baseline very tight and limited    Status On-going      PT LONG TERM GOAL #3   Title Pt will report able to turn head to look behind/check lanes while driving with no increase in cervical pain    Status Partially Met      PT LONG TERM GOAL #4   Title Pt will report 50% reduction in cervical pain    Status On-going                   Plan - 10/30/20 1337     Clinical Impression Statement Pt able to resume TE this rx. Progressed machine interventions with c/o fatigue but no increase in cervical pain with exercise. Still requires verbal and tactile cuing to prevent excessive elevation of shoulder during exercise. STM to B UT with focus on R>L; reports relief of pain post. Estim/heat at end of session per pt request.    Examination-Participation Restrictions Community Activity;Interpersonal Relationship;Driving    PT Treatment/Interventions ADLs/Self Care Home Management;Electrical Stimulation;Iontophoresis 4mg /ml Dexamethasone;Moist Heat;Therapeutic exercise;Therapeutic activities;Patient/family education;Manual techniques;Dry needling;Passive range of motion;Taping    PT Next Visit Plan progression of TE, cervical ex's and scap stab, manual to cervical, modalities as indicated     Consulted and Agree with Plan of Care Patient             Patient will benefit from skilled therapeutic intervention in order to improve the following deficits and impairments:  Decreased range of motion  Visit Diagnosis: Cervicalgia  Muscle weakness (generalized)  Cramp and spasm     Problem List Patient Active Problem List   Diagnosis Date Noted   Osteoporosis 09/28/2019   Vitamin D deficiency 07/08/2018   Benign paroxysmal positional vertigo 05/30/2015   Bunion, right foot 06/01/2014   Tobacco abuse 11/25/2013   Dysphagia 05/19/2013   Special screening for malignant neoplasms, colon 05/19/2013   GERD (gastroesophageal reflux disease) 04/04/2013   Allergic rhinitis 04/04/2013   General medical examination 09/11/2010   PREMATURE VENTRICULAR CONTRACTIONS 11/06/2009   SUPRAVENTRICULAR TACHYCARDIA 10/05/2009   LEUKOCYTOPENIA UNSPECIFIED 09/10/2009   DEPRESSIVE DISORDER 09/06/2009   ECZEMA 09/06/2009   PALPITATIONS, OCCASIONAL 06/08/2009   SHOULDER PAIN, LEFT 05/24/2009   BACK PAIN 05/24/2009   SCOLIOSIS 05/24/2009   LIPOMA 02/02/2008   Hyperlipidemia 02/02/2008   HYPERTENSION, BENIGN ESSENTIAL 02/02/2008   Amador Cunas, PT, DPT Donald Prose Layne Lebon 10/30/2020, 1:38 PM  [place]. [place], [place], [address] Phone: [phone]   Fax:  [phone]  Name: Janet Pierce MRN: [id number] Date of Birth: January 12, 1951

## 2020-10-31 ENCOUNTER — Telehealth: Payer: Self-pay | Admitting: Family Medicine

## 2020-10-31 ENCOUNTER — Telehealth (INDEPENDENT_AMBULATORY_CARE_PROVIDER_SITE_OTHER): Payer: Medicare Other | Admitting: Family Medicine

## 2020-10-31 ENCOUNTER — Encounter: Payer: Self-pay | Admitting: Family Medicine

## 2020-10-31 DIAGNOSIS — N390 Urinary tract infection, site not specified: Secondary | ICD-10-CM

## 2020-10-31 MED ORDER — CEPHALEXIN 500 MG PO CAPS: 500.0000 mg | ORAL_CAPSULE | Freq: Two times a day (BID) | ORAL | 0 refills | Status: AC

## 2020-10-31 NOTE — Progress Notes (Signed)
Virtual Visit via Video   I connected with patient on 10/31/20 at  9:30 AM EDT by a video enabled telemedicine application and verified that I am speaking with the correct person using two identifiers.  Location patient: Home Location provider: [place] [facility], Office Persons participating in the virtual visit: Patient, Provider, CMA (Sabrina M)  I discussed the limitations of evaluation and management by telemedicine and the availability of in person appointments. The patient expressed understanding and agreed to proceed.  Subjective:   HPI:   UTI- sxs started 'a couple of days ago'.  Today has L sided low back pain.  Pt reports there's a pinching pain when she urinates.  + increased frequency, hesitancy.  No fevers.  Pt reports clitoris feels swollen.  She changed body wash at the same time sxs started.  No itching or discharge.  ROS:   See pertinent positives and negatives per HPI.  Patient Active Problem List   Diagnosis Date Noted   Osteoporosis 09/28/2019   Vitamin D deficiency 07/08/2018   Benign paroxysmal positional vertigo 05/30/2015   Bunion, right foot 06/01/2014   Tobacco abuse 11/25/2013   Dysphagia 05/19/2013   Special screening for malignant neoplasms, colon 05/19/2013   GERD (gastroesophageal reflux disease) 04/04/2013   Allergic rhinitis 04/04/2013   General medical examination 09/11/2010   PREMATURE VENTRICULAR CONTRACTIONS 11/06/2009   SUPRAVENTRICULAR TACHYCARDIA 10/05/2009   LEUKOCYTOPENIA UNSPECIFIED 09/10/2009   DEPRESSIVE DISORDER 09/06/2009   ECZEMA 09/06/2009   PALPITATIONS, OCCASIONAL 06/08/2009   SHOULDER PAIN, LEFT 05/24/2009   BACK PAIN 05/24/2009   SCOLIOSIS 05/24/2009   LIPOMA 02/02/2008   Hyperlipidemia 02/02/2008   HYPERTENSION, BENIGN ESSENTIAL 02/02/2008    Social History   Tobacco Use   Smoking status: Former    Packs/day: 0.25    Pack years: 0.00    Types: Cigarettes    Quit date: 11/23/2016    Years since quitting:  3.9   Smokeless tobacco: Never   Tobacco comments:    stopped by using nicorette gum. has completely stopped  Substance Use Topics   Alcohol use: Yes    Alcohol/week: 0.0 standard drinks    Comment: occasionally    Current Outpatient Medications:    amLODipine (NORVASC) 10 MG tablet, TAKE 1 TABLET DAILY BEFORE BREAKFAST, Disp: 90 tablet, Rfl: 3   atorvastatin (LIPITOR) 20 MG tablet, TAKE 1 TABLET DAILY, Disp: 90 tablet, Rfl: 3   BINAXNOW COVID-19 AG HOME TEST KIT, TEST AS DIRECTED TODAY, Disp: , Rfl:    brimonidine (ALPHAGAN P) 0.1 % SOLN, Place 1 drop into the right eye in the morning and at bedtime., Disp: , Rfl:    dorzolamide (TRUSOPT) 2 % ophthalmic solution, INSTILL 1 DROP INTO BOTH EYES BID, Disp: , Rfl: 1   LUMIGAN 0.01 % SOLN, 1 drop at bedtime., Disp: , Rfl:    Multiple Vitamins-Minerals (MULTIVITAMIN GUMMIES WOMENS) CHEW, Chew 2 Doses by mouth every morning., Disp: , Rfl:    pantoprazole (PROTONIX) 40 MG tablet, TAKE 1 TABLET TWICE A DAY, Disp: 60 tablet, Rfl: 0   predniSONE (DELTASONE) 10 MG tablet, 3 tabs x3 days and then 2 tabs x3 days and then 1 tab x3 days.  Take w/ food., Disp: 18 tablet, Rfl: 0   timolol (TIMOPTIC) 0.5 % ophthalmic solution, Place 1 drop into the right eye 2 (two) times daily. , Disp: , Rfl:    tiZANidine (ZANAFLEX) 4 MG tablet, Take 1 tablet (4 mg total) by mouth every 8 (eight) hours as needed for  muscle spasms., Disp: 30 tablet, Rfl: 0   Vitamin D, Ergocalciferol, (DRISDOL) 1.25 MG (50000 UNIT) CAPS capsule, Take 1 capsule (50,000 Units total) by mouth every 7 (seven) days., Disp: 12 capsule, Rfl: 0  No Known Allergies  Objective:   There were no vitals taken for this visit. AAOx3, NAD NCAT, EOMI No obvious CN deficits Coloring WNL Pt is able to speak clearly, coherently without shortness of breath or increased work of breathing.  Thought process is linear.  Mood is appropriate.   Assessment and Plan:   UTI- new.  Pt's sxs are consistent w/  infxn and likely started after clitoral irritation due to switching body wash.  Will start Keflex and encouraged increased fluid intake.  Advised her to return to her previous soap to decrease the swelling.  Pt expressed understanding and is in agreement w/ plan.   [name], MD 10/31/2020

## 2020-10-31 NOTE — Progress Notes (Signed)
I connected with  Janet Pierce on 10/31/20 by a video enabled telemedicine application and verified that I am speaking with the correct person using two identifiers.   I discussed the limitations of evaluation and management by telemedicine. The patient expressed understanding and agreed to proceed.

## 2020-10-31 NOTE — Telephone Encounter (Signed)
Pt called in stating that she thinks that she believes that she is getting a UTI, she states that has a pinching feeling. She wanted to know if she needs to be seen or if something could be called in for her.  Please advise

## 2020-10-31 NOTE — Telephone Encounter (Signed)
Patient scheduled virtually for 9:30 today.

## 2020-11-06 ENCOUNTER — Ambulatory Visit: Payer: Medicare Other | Admitting: Physical Therapy

## 2020-11-06 ENCOUNTER — Other Ambulatory Visit: Payer: Self-pay

## 2020-11-06 ENCOUNTER — Encounter: Payer: Self-pay | Admitting: Physical Therapy

## 2020-11-06 DIAGNOSIS — M542 Cervicalgia: Secondary | ICD-10-CM

## 2020-11-06 DIAGNOSIS — R252 Cramp and spasm: Secondary | ICD-10-CM

## 2020-11-06 DIAGNOSIS — M6281 Muscle weakness (generalized): Secondary | ICD-10-CM

## 2020-11-06 NOTE — Therapy (Signed)
[place]. [place], [place], [address] Phone: [phone]   Fax:  [phone]  Physical Therapy Treatment Progress Note Reporting Period 08/29/2020 to 11/06/2020  See note below for Objective Data and Assessment of Progress/Goals.     Patient Details  Name: Janet Pierce MRN: [id number] Date of Birth: [birth year] Referring Provider (PT): Purcell Mouton Date: 11/06/2020   PT End of Session - 11/06/20 1208     Visit Number 10    Date for PT Re-Evaluation 11/21/20    PT Start Time 1135    PT Stop Time 1216    PT Time Calculation (min) 41 min    Activity Tolerance Patient tolerated treatment well;Patient limited by pain    Behavior During Therapy [facility] for tasks assessed/performed             Past Medical History:  Diagnosis Date   Anxiety    Arthritis    GERD (gastroesophageal reflux disease)    Glaucoma    Hyperlipidemia    Hypertension    Retinal detachment    left eye prosthesis   Scoliosis     Past Surgical History:  Procedure Laterality Date   BUNIONECTOMY Left    CATARACT EXTRACTION Right    EYE SURGERY Right    glucoma   LIPOMA EXCISION     hip   RETINAL DETACHMENT SURGERY Left    TONSILLECTOMY      There were no vitals filed for this visit.   Subjective Assessment - 11/06/20 1136     Subjective Pt reports she is experiencing increased R sided neck pain today following busy weekend.    Currently in Pain? Yes    Pain Score 8     Pain Location Neck    Pain Orientation Right                [facility] PT Assessment - 11/06/20 0001       Observation/Other Assessments   Focus on Therapeutic Outcomes (FOTO)  63%      AROM   Cervical Flexion 50    Cervical Extension 60    Cervical - Right Side Bend 30    Cervical - Left Side Bend 35    Cervical - Right Rotation 55    Cervical - Left Rotation 65                           [facility] Adult PT Treatment/Exercise -  11/06/20 0001       Neck Exercises: Machines for Strengthening   UBE (Upper Arm Bike) L2 x 3 min each      Neck Exercises: Supine   Neck Retraction 15 reps;3 secs    Shoulder Flexion Limitations 2x10 with 3# weight bar    Other Supine Exercise chest press 3# weights with serratus push 2x10    Other Supine Exercise W backs in supine 3# x10      Moist Heat Therapy   Number Minutes Moist Heat 10 Minutes    Moist Heat Location Cervical      Electrical Stimulation   Electrical Stimulation Location B UT/cervical    Electrical Stimulation Action IFC    Electrical Stimulation Parameters seated    Electrical Stimulation Goals Pain      Manual Therapy   Manual Therapy Soft tissue mobilization    Soft tissue mobilization bilateral upper traps  PT Short Term Goals - 09/14/20 1058       PT SHORT TERM GOAL #1   Title Pt will be independent with initial HEP    Time 2    Period Weeks    Status Achieved    Target Date 09/12/20               PT Long Term Goals - 11/06/20 1149       PT LONG TERM GOAL #1   Title Pt will be I with advanced HEP    Status Partially Met      PT LONG TERM GOAL #2   Title Pt will demo cervical AROM within 75% of normal range with no increase in cervical pain    Baseline see objective for details; cervical ROM improved since eval    Status Partially Met      PT LONG TERM GOAL #3   Title Pt will report able to turn head to look behind/check lanes while driving with no increase in cervical pain    Status Partially Met      PT LONG TERM GOAL #4   Title Pt will report 50% reduction in cervical pain    Baseline reports 70% improvement in cervical pain    Status Achieved      PT LONG TERM GOAL #5   Title Pt will increase FOTO score to 65%    Baseline 63%    Status Partially Met                   Plan - 11/06/20 1209     Clinical Impression Statement Pt presents to clinic with c/o increase in R sided  cervical pain following busy weekend carrying items at [employer]. Regressed TE to supine ex's this rx with no c/o increased pain with these. Cues for form with W backs and chest press w/ serratus push. Overall, pt is making progress toward LTGs. Demos improved cervical ROM except R cerv rotation/cerv flexion. Estim/heat at end of session per pt request. Likely d/c next rx.    PT Treatment/Interventions ADLs/Self Care Home Management;Electrical Stimulation;Iontophoresis 4mg /ml Dexamethasone;Moist Heat;Therapeutic exercise;Therapeutic activities;Patient/family education;Manual techniques;Dry needling;Passive range of motion;Taping    PT Next Visit Plan progression of TE, cervical ex's and scap stab, manual to cervical, modalities as indicated    Consulted and Agree with Plan of Care Patient             Patient will benefit from skilled therapeutic intervention in order to improve the following deficits and impairments:     Visit Diagnosis: Cervicalgia  Muscle weakness (generalized)  Cramp and spasm     Problem List Patient Active Problem List   Diagnosis Date Noted   Osteoporosis 09/28/2019   Vitamin D deficiency 07/08/2018   Benign paroxysmal positional vertigo 05/30/2015   Bunion, right foot 06/01/2014   Tobacco abuse 11/25/2013   Dysphagia 05/19/2013   Special screening for malignant neoplasms, colon 05/19/2013   GERD (gastroesophageal reflux disease) 04/04/2013   Allergic rhinitis 04/04/2013   General medical examination 09/11/2010   PREMATURE VENTRICULAR CONTRACTIONS 11/06/2009   SUPRAVENTRICULAR TACHYCARDIA 10/05/2009   LEUKOCYTOPENIA UNSPECIFIED 09/10/2009   DEPRESSIVE DISORDER 09/06/2009   ECZEMA 09/06/2009   PALPITATIONS, OCCASIONAL 06/08/2009   SHOULDER PAIN, LEFT 05/24/2009   BACK PAIN 05/24/2009   SCOLIOSIS 05/24/2009   LIPOMA 02/02/2008   Hyperlipidemia 02/02/2008   HYPERTENSION, BENIGN ESSENTIAL 02/02/2008   Amador Cunas, PT, DPT Donald Prose  Macie Baum 11/06/2020, 12:12 PM  [facility]  [place]. [place], [place], [address] Phone: [phone]   Fax:  [phone]  Name: Haylin Camilli MRN: [id number] Date of Birth: 26-Oct-1950

## 2020-11-13 ENCOUNTER — Ambulatory Visit: Payer: Medicare Other | Admitting: Physical Therapy

## 2020-11-13 ENCOUNTER — Encounter: Payer: Self-pay | Admitting: Physical Therapy

## 2020-11-13 ENCOUNTER — Other Ambulatory Visit: Payer: Self-pay

## 2020-11-13 DIAGNOSIS — M542 Cervicalgia: Secondary | ICD-10-CM | POA: Diagnosis not present

## 2020-11-13 DIAGNOSIS — R252 Cramp and spasm: Secondary | ICD-10-CM

## 2020-11-13 DIAGNOSIS — M6281 Muscle weakness (generalized): Secondary | ICD-10-CM

## 2020-11-13 NOTE — Therapy (Signed)
[place] [place], [place], [address] Phone: [phone]   Fax:  [phone]  Physical Therapy Treatment  Patient Details  Name: Janet Pierce MRN: [id number] Date of Birth: [birth year] Referring Provider (PT): Purcell Mouton Date: 11/13/2020   PT End of Session - 11/13/20 1249     Visit Number 11    Date for PT Re-Evaluation 12/22/20    PT Start Time 1221    PT Stop Time 1300    PT Time Calculation (min) 39 min    Activity Tolerance Patient tolerated treatment well;Patient limited by pain    Behavior During Therapy [facility] for tasks assessed/performed             Past Medical History:  Diagnosis Date   Anxiety    Arthritis    GERD (gastroesophageal reflux disease)    Glaucoma    Hyperlipidemia    Hypertension    Retinal detachment    left eye prosthesis   Scoliosis     Past Surgical History:  Procedure Laterality Date   BUNIONECTOMY Left    CATARACT EXTRACTION Right    EYE SURGERY Right    glucoma   LIPOMA EXCISION     hip   RETINAL DETACHMENT SURGERY Left    TONSILLECTOMY      There were no vitals filed for this visit.   Subjective Assessment - 11/13/20 1222     Subjective Pt states continued increased R sided neck pain/stiffness. Would like to continue with PT 1x/week for a few more weeks.    Currently in Pain? Yes    Pain Score 5     Pain Location Neck    Pain Orientation Right                               [facility] Adult PT Treatment/Exercise - 11/13/20 0001       Neck Exercises: Machines for Strengthening   UBE (Upper Arm Bike) L2 x 3 min each    Cybex Row 20# 2x10    Cybex Chest Press 5# 2x10    Lat Pull 20# 2x10    Other Machines for Strengthening standing shoulder ext 5# 2x10    Other Machines for Strengthening 20# triceps 2x10; 10# biceps 2x10      Moist Heat Therapy   Number Minutes Moist Heat 10 Minutes    Moist Heat Location Cervical       Electrical Stimulation   Electrical Stimulation Location B UT/cervical    Electrical Stimulation Action IFC    Electrical Stimulation Parameters seated    Electrical Stimulation Goals Pain                      PT Short Term Goals - 09/14/20 1058       PT SHORT TERM GOAL #1   Title Pt will be independent with initial HEP    Time 2    Period Weeks    Status Achieved    Target Date 09/12/20               PT Long Term Goals - 11/06/20 1149       PT LONG TERM GOAL #1   Title Pt will be I with advanced HEP    Status Partially Met      PT LONG TERM GOAL #2   Title Pt will demo cervical AROM within 75% of  normal range with no increase in cervical pain    Baseline see objective for details; cervical ROM improved since eval    Status Partially Met      PT LONG TERM GOAL #3   Title Pt will report able to turn head to look behind/check lanes while driving with no increase in cervical pain    Status Partially Met      PT LONG TERM GOAL #4   Title Pt will report 50% reduction in cervical pain    Baseline reports 70% improvement in cervical pain    Status Achieved      PT LONG TERM GOAL #5   Title Pt will increase FOTO score to 65%    Baseline 63%    Status Partially Met                   Plan - 11/13/20 1250     Clinical Impression Statement Pt arrived to session ~6 min late so abbreviated session this rx. Pt is hesitant to d/c this rx d/t flare up of R sided neck pain/stiffness. Discussed continuation of PT at 1x/week for a few more weeks with likely d/c at the end of that period. Pt VU and agreement. Reports decreased pain following TE. Requests estim/heat at end of session for pain relief.    PT Treatment/Interventions ADLs/Self Care Home Management;Electrical Stimulation;Iontophoresis 64m/ml Dexamethasone;Moist Heat;Therapeutic exercise;Therapeutic activities;Patient/family education;Manual techniques;Dry needling;Passive range of motion;Taping     PT Next Visit Plan progression of TE, cervical ex's and scap stab, manual to cervical, modalities as indicated    Consulted and Agree with Plan of Care Patient             Patient will benefit from skilled therapeutic intervention in order to improve the following deficits and impairments:     Visit Diagnosis: Cervicalgia  Muscle weakness (generalized)  Cramp and spasm     Problem List Patient Active Problem List   Diagnosis Date Noted   Osteoporosis 09/28/2019   Vitamin D deficiency 07/08/2018   Benign paroxysmal positional vertigo 05/30/2015   Bunion, right foot 06/01/2014   Tobacco abuse 11/25/2013   Dysphagia 05/19/2013   Special screening for malignant neoplasms, colon 05/19/2013   GERD (gastroesophageal reflux disease) 04/04/2013   Allergic rhinitis 04/04/2013   General medical examination 09/11/2010   PREMATURE VENTRICULAR CONTRACTIONS 11/06/2009   SUPRAVENTRICULAR TACHYCARDIA 10/05/2009   LEUKOCYTOPENIA UNSPECIFIED 09/10/2009   DEPRESSIVE DISORDER 09/06/2009   ECZEMA 09/06/2009   PALPITATIONS, OCCASIONAL 06/08/2009   SHOULDER PAIN, LEFT 05/24/2009   BACK PAIN 05/24/2009   SCOLIOSIS 05/24/2009   LIPOMA 02/02/2008   Hyperlipidemia 02/02/2008   HYPERTENSION, BENIGN ESSENTIAL 02/02/2008   AAmador Cunas PT, DPT ADonald ProseSugg 11/13/2020, 12:52 PM  [place] [address]Phone: [phone]  Fax:  [phone] Name: WSophi CalliganMRN: [id number] of Birth: 107-May-1952

## 2020-11-15 ENCOUNTER — Encounter: Payer: Self-pay | Admitting: Physician Assistant

## 2020-11-15 ENCOUNTER — Ambulatory Visit: Payer: Medicare Other | Admitting: Physician Assistant

## 2020-11-15 ENCOUNTER — Other Ambulatory Visit (INDEPENDENT_AMBULATORY_CARE_PROVIDER_SITE_OTHER): Payer: Medicare Other

## 2020-11-15 VITALS — BP 120/70 | HR 110 | Ht 63.0 in | Wt 143.8 lb

## 2020-11-15 DIAGNOSIS — K219 Gastro-esophageal reflux disease without esophagitis: Secondary | ICD-10-CM | POA: Diagnosis not present

## 2020-11-15 DIAGNOSIS — R140 Abdominal distension (gaseous): Secondary | ICD-10-CM

## 2020-11-15 DIAGNOSIS — R1010 Upper abdominal pain, unspecified: Secondary | ICD-10-CM | POA: Diagnosis not present

## 2020-11-15 LAB — BASIC METABOLIC PANEL
BUN: 12 mg/dL (ref 6–23)
CO2: 30 mEq/L (ref 19–32)
Calcium: 9.5 mg/dL (ref 8.4–10.5)
Chloride: 102 mEq/L (ref 96–112)
Creatinine, Ser: 0.76 mg/dL (ref 0.40–1.20)
GFR: 79.82 mL/min (ref >60.00)
Glucose, Bld: 116 mg/dL — ABNORMAL HIGH (ref 70–99)
Potassium: 3.6 mEq/L (ref 3.5–5.1)
Sodium: 140 mEq/L (ref 135–145)

## 2020-11-15 LAB — CBC WITH DIFFERENTIAL/PLATELET
Basophils Absolute: 0.1 10*3/uL (ref 0.0–0.1)
Basophils Relative: 1 % (ref 0.0–3.0)
Eosinophils Absolute: 0.1 10*3/uL (ref 0.0–0.7)
Eosinophils Relative: 2 % (ref 0.0–5.0)
HCT: 34.5 % — ABNORMAL LOW (ref 36.0–46.0)
Hemoglobin: 11.7 g/dL — ABNORMAL LOW (ref 12.0–15.0)
Lymphocytes Relative: 33.2 % (ref 12.0–46.0)
Lymphs Abs: 1.6 10*3/uL (ref 0.7–4.0)
MCHC: 33.9 g/dL (ref 30.0–36.0)
MCV: 82.9 fl (ref 78.0–100.0)
Monocytes Absolute: 0.4 10*3/uL (ref 0.1–1.0)
Monocytes Relative: 9.2 % (ref 3.0–12.0)
Neutro Abs: 2.6 10*3/uL (ref 1.4–7.7)
Neutrophils Relative %: 54.6 % (ref 43.0–77.0)
Platelets: 254 10*3/uL (ref 150.0–400.0)
RBC: 4.16 Mil/uL (ref 3.87–5.11)
RDW: 13.4 % (ref 11.5–15.5)
WBC: 4.8 10*3/uL (ref 4.0–10.5)

## 2020-11-15 NOTE — Progress Notes (Signed)
Subjective:    Patient ID: Janet Pierce, female    DOB: 17-Nov-1950, 70 y.o.   MRN: [id number]  HPI Janet is a 70 year old African-American female, recently established with Dr. Havery Moros when she was seen in May 2022.  She has long history of GERD and long-term PPI use, and intermittent mild dysphagia to solids.  She was also due for screening colonoscopy, and was to be scheduled for EGD with possible dilation and colonoscopy.  She was noted to have a mild anemia and iron studies were done 09/18/2020 showing serum iron 75 transferrin 323/iron sat 16 and ferritin 31.8. She says she did complete a 4 to 6-week course of oral iron. She had called with complaints of significant abdominal bloating and gas which has occurred over the past 4 to 6 weeks.  She says she has never had these symptoms in the past and it concerns her.  She does not feel that she is constipated and says she is having bowel movements very regularly.  She has gained a little bit of weight but is uncertain whether the abdominal bloating may be attributable to upper abdominal weight gain.  She admits to being stressed as she has a friend who was recently diagnosed with a GI cancer. She says she had not been taking her Protonix regularly but has restarted that. Was suggested over the phone that she try MiraLAX but did not feel she needed this as she is not constipated, has not been using Gas-X, and did not try the low FODMAP diet. She is asking if she can have further evaluation of her abdomen due to new complaints of bloating and sensation of distention.  Review of Systems.Pertinent positive and negative review of systems were noted in the above HPI section.  All other review of systems was otherwise negative.   Outpatient Encounter Medications as of 11/15/2020  Medication Sig   amLODipine (NORVASC) 10 MG tablet TAKE 1 TABLET DAILY BEFORE BREAKFAST   atorvastatin (LIPITOR) 20 MG tablet TAKE 1 TABLET DAILY   brimonidine (ALPHAGAN P)  0.1 % SOLN Place 1 drop into the right eye in the morning and at bedtime.   dorzolamide (TRUSOPT) 2 % ophthalmic solution INSTILL 1 DROP INTO BOTH EYES BID   LUMIGAN 0.01 % SOLN 1 drop at bedtime.   Multiple Vitamins-Minerals (MULTIVITAMIN GUMMIES WOMENS) CHEW Chew 2 Doses by mouth every morning.   pantoprazole (PROTONIX) 40 MG tablet TAKE 1 TABLET TWICE A DAY   simethicone (MYLICON) 675 MG chewable tablet Chew 125 mg by mouth every 6 (six) hours as needed for flatulence.   timolol (TIMOPTIC) 0.5 % ophthalmic solution Place 1 drop into the right eye 2 (two) times daily.    tiZANidine (ZANAFLEX) 4 MG tablet Take 1 tablet (4 mg total) by mouth every 8 (eight) hours as needed for muscle spasms.   Vitamin D, Ergocalciferol, (DRISDOL) 1.25 MG (50000 UNIT) CAPS capsule Take 1 capsule (50,000 Units total) by mouth every 7 (seven) days.   [DISCONTINUED] BINAXNOW COVID-19 AG HOME TEST KIT TEST AS DIRECTED TODAY   [DISCONTINUED] predniSONE (DELTASONE) 10 MG tablet 3 tabs x3 days and then 2 tabs x3 days and then 1 tab x3 days.  Take w/ food.   No facility-administered encounter medications on file as of 11/15/2020.   No Known Allergies Patient Active Problem List   Diagnosis Date Noted   Osteoporosis 09/28/2019   Vitamin D deficiency 07/08/2018   Benign paroxysmal positional vertigo 05/30/2015   Bunion, right foot 06/01/2014  Tobacco abuse 11/25/2013   Dysphagia 05/19/2013   Special screening for malignant neoplasms, colon 05/19/2013   GERD (gastroesophageal reflux disease) 04/04/2013   Allergic rhinitis 04/04/2013   General medical examination 09/11/2010   PREMATURE VENTRICULAR CONTRACTIONS 11/06/2009   SUPRAVENTRICULAR TACHYCARDIA 10/05/2009   LEUKOCYTOPENIA UNSPECIFIED 09/10/2009   DEPRESSIVE DISORDER 09/06/2009   ECZEMA 09/06/2009   PALPITATIONS, OCCASIONAL 06/08/2009   SHOULDER PAIN, LEFT 05/24/2009   BACK PAIN 05/24/2009   SCOLIOSIS 05/24/2009   LIPOMA 02/02/2008   Hyperlipidemia  02/02/2008   HYPERTENSION, BENIGN ESSENTIAL 02/02/2008   Social History   Socioeconomic History   Marital status: Legally Separated    Spouse name: Not on file   Number of children: 2   Years of education: Not on file   Highest education level: Not on file  Occupational History   Occupation: Press photographer associate/retired    Comment: [employer]  Tobacco Use   Smoking status: Former    Packs/day: 0.25    Types: Cigarettes    Quit date: 11/23/2016    Years since quitting: 3.9   Smokeless tobacco: Never   Tobacco comments:    stopped by using nicorette gum. has completely stopped  Vaping Use   Vaping Use: Never used  Substance and Sexual Activity   Alcohol use: Yes    Alcohol/week: 0.0 standard drinks    Comment: occasionally   Drug use: No   Sexual activity: Not on file  Other Topics Concern   Not on file  Social History Narrative   Not on file   Social Determinants of Health   Financial Resource Strain: Low Risk    Difficulty of Paying Living Expenses: Not hard at all  Food Insecurity: No Food Insecurity   Worried About Charity fundraiser in the Last Year: Never true   [place] in the Last Year: Never true  Transportation Needs: No Transportation Needs   Lack of Transportation (Medical): No   Lack of Transportation (Non-Medical): No  Physical Activity: Insufficiently Active   Days of Exercise per Week: 2 days   Minutes of Exercise per Session: 60 min  Stress: No Stress Concern Present   Feeling of Stress : Not at all  Social Connections: Moderately Isolated   Frequency of Communication with Friends and Family: More than three times a week   Frequency of Social Gatherings with Friends and Family: More than three times a week   Attends Religious Services: Never   Marine scientist or Organizations: Yes   Attends Music therapist: More than 4 times per year   Marital Status: Separated  Intimate Partner Violence: Not At Risk   Fear of Current or  Ex-Partner: No   Emotionally Abused: No   Physically Abused: No   Sexually Abused: No    Ms. Pierce's family history includes Bone cancer in her mother; Diabetes in her maternal grandmother; Fibroids in her daughter; Hypertension in her brother and mother; Liver cancer in her mother; Lung cancer in her mother; Other in her sister.      Objective:    Vitals:   11/15/20 1509  BP: 120/70  Pulse: (!) 110    Physical Exam Well-developed well-nourished older African-American female in no acute distress.   Weight, 143 BMI 25.4  HEENT; nontraumatic normocephalic, EOMI, PE R LA, sclera anicteric. Oropharynx; not examined today Neck; supple, no JVD Cardiovascular; regular rate and rhythm with S1-S2, no murmur rub or gallop Pulmonary; Clear bilaterally Abdomen; soft,  nondistended, no  focal tenderness no palpable mass or hepatosplenomegaly, bowel sounds are hyperactive Rectal; not done today Skin; benign exam, no jaundice rash or appreciable lesions Extremities; no clubbing cyanosis or edema skin warm and dry Neuro/Psych; alert and oriented x4, grossly nonfocal mood and affect appropriate        Assessment & Plan:   #60 70 year old female with new complaints of abdominal bloating and sense of distention over the past 4 to 6 weeks. Etiology not clear, rule out neoplasm, rule out intra-abdominal inflammatory process, rule out possible SIBO  #2 chronic GERD intermittent solid food dysphagia, rule out stricture #3 colon cancer screening-due for follow-up colonoscopy which is currently scheduled for August 2022 #4 mild anemia-early iron deficiency, completed oral iron x4 to 6 weeks #5 hypertension 6.  Hyperlipidemia  Plan; proceed with EGD with possible dilation and colonoscopy with Dr. Havery Moros previously scheduled for August.  We will review prep etc. today.. CBC and BMET Scheduled for CT scan of the abdomen and pelvis with contrast. Low gas diet discussed avoidance of artificial  sweeteners, carbonated beverages and minimizing lactose.  She was also given a copy of low gas diet to review and eliminate high gas producers. Start Gas-X 2 p.o. with each meal as a trial over the next couple of weeks. Further recommendations pending results of above.   Deryl Giroux Genia Harold PA-C 11/15/2020   Cc: Midge Minium, MD

## 2020-11-15 NOTE — Patient Instructions (Signed)
If you are age 70 or older, your body mass index should be between 23-30. Your Body mass index is 25.47 kg/m. If this is out of the aforementioned range listed, please consider follow up with your Primary Care Provider. __________________________________________________________  The [facility] providers would like to encourage you to use [facility] to communicate with providers for non-urgent requests or questions.  Due to long hold times on the telephone, sending your provider a message by [facility] may be a faster and more efficient way to get a response.  Please allow 48 business hours for a response.  Please remember that this is for non-urgent requests.   You have been scheduled for a CT scan of the abdomen and pelvis at [facility] ([address]---this is in the same building as [employer]).   You are scheduled on 11/21/2020 at 1:30 pm. You should arrive 15 minutes prior to your appointment time for registration. Please follow the written instructions below on the day of your exam:  WARNING: IF YOU ARE ALLERGIC TO IODINE/X-RAY DYE, PLEASE NOTIFY RADIOLOGY IMMEDIATELY AT [phone]! YOU WILL BE GIVEN A 13 HOUR PREMEDICATION PREP.  1) Do not eat or drink anything after 9:30 am (4 hours prior to your test) 2) You have been given 2 bottles of oral contrast to drink. The solution may taste better if refrigerated, but do NOT add ice or any other liquid to this solution. Shake well before drinking.    Drink 1 bottle of contrast @ 11:30 am (2 hours prior to your exam)  Drink 1 bottle of contrast @ 12:30 pm (1 hour prior to your exam)  You may take any medications as prescribed with a small amount of water, if necessary. If you take any of the following medications: METFORMIN, GLUCOPHAGE, GLUCOVANCE, AVANDAMET, RIOMET, FORTAMET, Nunda MET, JANUMET, GLUMETZA or METAGLIP, you MAY be asked to HOLD this medication 48 hours AFTER the exam.  The purpose of you drinking the oral  contrast is to aid in the visualization of your intestinal tract. The contrast solution may cause some diarrhea. Depending on your individual set of symptoms, you may also receive an intravenous injection of x-ray contrast/dye. Plan on being at [facility] for 30 minutes or longer, depending on the type of exam you are having performed.  This test typically takes 30-45 minutes to complete.  If you have any questions regarding your exam or if you need to reschedule, you may call the CT department at [phone] between the hours of 8:00 am and 5:00 pm, Monday-Friday.  ____________________________________________________________  Your provider has requested that you go to the basement level for lab work before leaving today. Press "B" on the elevator. The lab is located at the first door on the left as you exit the elevator.  Try using Gas-X 1-2 tablets with each meal  Continue Pantoprazole 40 mg 1 tablet every morning.  Keep your Colonoscopy procedure scheduled for 12/10/2020  Follow a Low Gas Diet.  Thank you for entrusting me with your care and choosing [facility].  Amy Esterwood, PA-C

## 2020-11-20 ENCOUNTER — Encounter: Payer: Medicare Other | Admitting: Physical Therapy

## 2020-11-20 NOTE — Progress Notes (Signed)
Agree with assessment and plan as outlined.  

## 2020-11-21 ENCOUNTER — Ambulatory Visit (INDEPENDENT_AMBULATORY_CARE_PROVIDER_SITE_OTHER)
Admission: RE | Admit: 2020-11-21 | Discharge: 2020-11-21 | Disposition: A | Discharge status: Home / Self Care | Payer: Medicare Other | Source: Ambulatory Visit | Attending: Physician Assistant | Admitting: Physician Assistant

## 2020-11-21 ENCOUNTER — Other Ambulatory Visit: Payer: Self-pay

## 2020-11-21 ENCOUNTER — Other Ambulatory Visit: Payer: Self-pay | Admitting: Family Medicine

## 2020-11-21 ENCOUNTER — Encounter: Payer: Medicare Other | Admitting: Physical Therapy

## 2020-11-21 DIAGNOSIS — K219 Gastro-esophageal reflux disease without esophagitis: Secondary | ICD-10-CM

## 2020-11-21 DIAGNOSIS — R1010 Upper abdominal pain, unspecified: Secondary | ICD-10-CM

## 2020-11-21 DIAGNOSIS — R140 Abdominal distension (gaseous): Secondary | ICD-10-CM | POA: Diagnosis not present

## 2020-11-21 MED ORDER — IOHEXOL 300 MG/ML  SOLN
100.0000 mL | Freq: Once | INTRAMUSCULAR | Status: AC | PRN
  Administered 2020-11-21: 100 mL via INTRAVENOUS

## 2020-11-26 ENCOUNTER — Encounter: Payer: Self-pay | Admitting: *Deleted

## 2020-11-26 ENCOUNTER — Telehealth: Payer: Self-pay | Admitting: *Deleted

## 2020-11-26 NOTE — Telephone Encounter (Signed)
Spoke with patient. Patient made aware of CT results, patient verbalized understanding and nothing further needed at this time

## 2020-11-28 ENCOUNTER — Telehealth: Payer: Self-pay | Admitting: Physician Assistant

## 2020-11-28 NOTE — Telephone Encounter (Signed)
Inbound call from patient states she experiencing a lot of gas and having gas pains. States Gas x is not working. Best contact number [phone]

## 2020-11-29 NOTE — Telephone Encounter (Signed)
Spoke with the patient. She is following the diet guidelines and taking the gas-x ac meals. She is having daily emptying bowel movements. She complains of her gas symptoms continuing. Her "stomach makes noise all the time. I burp and I pass lots of gas." She cannot relate it to any certain food. She feels a little distended in the mornings and this gets worse through the day. Is there anything else she could try?

## 2020-11-30 ENCOUNTER — Other Ambulatory Visit: Payer: Self-pay

## 2020-11-30 ENCOUNTER — Encounter: Payer: Self-pay | Admitting: Registered Nurse

## 2020-11-30 ENCOUNTER — Ambulatory Visit: Payer: Medicare Other | Admitting: Registered Nurse

## 2020-11-30 VITALS — BP 115/66 | HR 95 | Temp 98.0°F | Resp 16 | Ht 64.0 in | Wt 145.4 lb

## 2020-11-30 DIAGNOSIS — R351 Nocturia: Secondary | ICD-10-CM

## 2020-11-30 LAB — POCT URINALYSIS DIP (MANUAL ENTRY)
Bilirubin, UA: NEGATIVE
Glucose, UA: NEGATIVE mg/dL
Nitrite, UA: NEGATIVE
Spec Grav, UA: 1.025 (ref 1.010–1.025)
Urobilinogen, UA: 0.2 E.U./dL
pH, UA: 5 (ref 5.0–8.0)

## 2020-11-30 NOTE — Telephone Encounter (Signed)
Discussed with the patient. She will get Beano and try this. Plans to pick up the test kit for the breath test later today.  Kit at the front desk for pick up at her convenience.

## 2020-11-30 NOTE — Patient Instructions (Addendum)
Ms Janet Pierce to meet you  UTI - treated with bactrim twice daily for three days. Urine culture will tell us whether this works or not - if not, I'll call you to adjust.  Abdominal discomfort:  Iron with something acidic - should be ok to just take with vitamin c tablet, but a small glass of orange juice could help. Take with 16 oz of water See attached for FODMAP foods and implications Hydrate! At least 1/2 of your body weight in oz of water each day. You are at 145 today, so recommend at least 72.5oz of water, which is almost 3 quarts, a bit more than half a gallon. Will follow closely on endoscopy and colonoscopy results - Dr. Birdie Riddle will see as well.  Let me know if symptoms persist or worsen  Thank you  Rich     If you have lab work done today you will be contacted with your lab results within the next 2 weeks.  If you have not heard from [place] then please contact us. The fastest way to get your results is to register for My Chart.   IF you received an x-ray today, you will receive an invoice from [facility]. Please contact [facility] at [phone] with questions or concerns regarding your invoice.   IF you received labwork today, you will receive an invoice from [facility]. Please contact [facility] at [phone] with questions or concerns regarding your invoice.   Our billing staff will not be able to assist you with questions regarding bills from these companies.  You will be contacted with the lab results as soon as they are available. The fastest way to get your results is to activate your My Chart account. Instructions are located on the last page of this paperwork. If you have not heard from [place] regarding the results in 2 weeks, please contact this office.

## 2020-12-02 LAB — URINE CULTURE
MICRO NUMBER:: 12238641
SPECIMEN QUALITY:: ADEQUATE

## 2020-12-04 ENCOUNTER — Telehealth: Payer: Self-pay

## 2020-12-04 NOTE — Telephone Encounter (Signed)
Inbound call from patient states she was prescribed antibiotic, Sulfameth,because she had blood in urine. Finished the antibiotic 8/16.Stomach pain still consistent and bloating. Have taken beano and two capsules of mylanta this morning. Is having regular bowel movements. Asking what can she do before her procedure 8/22. Best contact number [phone]

## 2020-12-04 NOTE — Telephone Encounter (Signed)
Patient states she was seen by Maximiano Coss on 8/12.  Was prescribed an antibiotic.  Completed course on 8/15.  States she is still having pain in stomach and bloating.    States she used albino, antiacids and has had solid bowel movements.    Would like a call back in regard as to if there is anything else she can do?

## 2020-12-05 ENCOUNTER — Other Ambulatory Visit: Payer: Self-pay

## 2020-12-05 DIAGNOSIS — K635 Polyp of colon: Secondary | ICD-10-CM

## 2020-12-05 NOTE — Telephone Encounter (Signed)
Defer to GI on this -   Thanks  Rich

## 2020-12-06 NOTE — Telephone Encounter (Signed)
Patient reports her appointment is for procedures, endoscopy and colonoscopy, on 12/10/20. Patient states she is still going to the bathroom, loose stools. Patient reports intermittent pain around belly button area. She states she is waiting to hear back from gastro since she did reach out to them. I informed her to make them aware of what is going on to see wether or not they want to reschedule her procedures, or if she is okay to proceed.

## 2020-12-07 ENCOUNTER — Telehealth: Payer: Self-pay

## 2020-12-07 MED ORDER — PLENVU 140 G PO SOLR: 1.0000 | ORAL | 0 refills | Status: DC

## 2020-12-07 NOTE — Telephone Encounter (Signed)
Pt is still having problem with UTI pt was prescribed PEG-KCl-NaCl-NaSulf-Na Asc-C (PLENVU) 140 g SOLR and now she has a colonoscopy and endoscopy on Monday and she is wanting to know if she will be able to get more antibiotic and if so is it safe to take before her proceduere on Monday.  [place], [place]   Pt call back [phone]

## 2020-12-07 NOTE — Telephone Encounter (Signed)
Hi Dr. Havery Moros,   Called pt to remind her of procedure on Monday 8/22. Pt stated that she was experiencing some chest discomfort earlier this week. Stated discomfort has subsided today, and pt thinks it was attributed to her not taking her pantoprazole, and starting back smoking again. Advised patient to monitor symptoms and if discomfort gets worse to go to ER for evaluation, and to let [facility] know of any changes. Pt verbalized understanding and had no further concerns at end of call.

## 2020-12-07 NOTE — Telephone Encounter (Signed)
Pt called stating that he pharmacy has not received prescription for prep yet. She stated that you were going to send it so she is asking if you could send it again, pls. Her procedure is on Monday.

## 2020-12-07 NOTE — Telephone Encounter (Signed)
Patient saw me earlier this year complaining of chest pain relieved with belching. She had a negative cardiac workup a few years ago for this, thought to be related to her GI tract. If her similar chronic symptoms yes she should take PPI and we can proceed with her exam. If shortness of breath, different than her baseline symptoms, then she needs to let us know. Janet Pierce can you please clarify with her.   Otherwise can you please help with her prep prescription.

## 2020-12-07 NOTE — Telephone Encounter (Signed)
Spoke with patient, she states that her symptoms are the same as before. She denies any SOB or severe chest pain. She states that she re-started her Protonix last night and has taken a dose this morning as well. Advised patient to continue PPI and if symptoms worsen she will need to let us know or go to the ED for evaluation. Pt would like prep sent to [facility] on file and plans to keep procedure as scheduled for Monday.  Plenvu RX sent to pharmacy.

## 2020-12-09 ENCOUNTER — Telehealth (HOSPITAL_COMMUNITY): Payer: Self-pay | Admitting: Physician Assistant

## 2020-12-09 NOTE — Telephone Encounter (Signed)
The patient called the answering service today.  She got worried because she ate corn and peas at dinner last night.  Her daughter got alarmed and the patient got alarmed.  Scheduled for EGD and colonoscopy tomorrow.  Was to begin clear liquids today and bowel prep starting later today.  I reassured her that there was no harm in her eating corn and peas last night.  She then started discussing her "diverticulitis".  Michela Pitcher that it had been seen on a CT scan "2 weeks ago".  Indeed she had CTAP w contrast on 8/2 for distention and pain in abdomen with increased belching.  This showed only uncomplicated colonic diverticulosis.  Not diverticulitis.  I reassured her that there was no problem with her proceeding with the colonoscopy tomorrow that she should follow all the instructions outlined on the pre-colonoscopy instructions.  She seemed reassured though anxious.

## 2020-12-10 ENCOUNTER — Encounter: Payer: Self-pay | Admitting: Gastroenterology

## 2020-12-10 ENCOUNTER — Other Ambulatory Visit: Payer: Self-pay

## 2020-12-10 ENCOUNTER — Encounter: Payer: Medicare Other | Admitting: Family Medicine

## 2020-12-10 ENCOUNTER — Ambulatory Visit (AMBULATORY_SURGERY_CENTER): Payer: Medicare Other | Admitting: Gastroenterology

## 2020-12-10 VITALS — BP 110/73 | HR 72 | Temp 98.2°F | Resp 20 | Ht 63.0 in | Wt 143.0 lb

## 2020-12-10 DIAGNOSIS — K648 Other hemorrhoids: Secondary | ICD-10-CM | POA: Diagnosis not present

## 2020-12-10 DIAGNOSIS — K219 Gastro-esophageal reflux disease without esophagitis: Secondary | ICD-10-CM | POA: Diagnosis not present

## 2020-12-10 DIAGNOSIS — K2990 Gastroduodenitis, unspecified, without bleeding: Secondary | ICD-10-CM | POA: Diagnosis not present

## 2020-12-10 DIAGNOSIS — K5730 Diverticulosis of large intestine without perforation or abscess without bleeding: Secondary | ICD-10-CM

## 2020-12-10 DIAGNOSIS — K2970 Gastritis, unspecified, without bleeding: Secondary | ICD-10-CM

## 2020-12-10 DIAGNOSIS — D122 Benign neoplasm of ascending colon: Secondary | ICD-10-CM

## 2020-12-10 DIAGNOSIS — R1310 Dysphagia, unspecified: Secondary | ICD-10-CM

## 2020-12-10 DIAGNOSIS — Z1211 Encounter for screening for malignant neoplasm of colon: Secondary | ICD-10-CM

## 2020-12-10 DIAGNOSIS — D125 Benign neoplasm of sigmoid colon: Secondary | ICD-10-CM

## 2020-12-10 DIAGNOSIS — R140 Abdominal distension (gaseous): Secondary | ICD-10-CM

## 2020-12-10 DIAGNOSIS — D509 Iron deficiency anemia, unspecified: Secondary | ICD-10-CM

## 2020-12-10 MED ORDER — SODIUM CHLORIDE 0.9 % IV SOLN: 500.0000 mL | Freq: Once | INTRAVENOUS | Status: DC

## 2020-12-10 NOTE — Progress Notes (Signed)
Called to room to assist during endoscopic procedure.  Patient ID and intended procedure confirmed with present staff. Received instructions for my participation in the procedure from the performing physician.  

## 2020-12-10 NOTE — Telephone Encounter (Signed)
Thanks Sarah, agree with your recommendations.

## 2020-12-10 NOTE — Progress Notes (Signed)
Check-in-aer  V/s-as

## 2020-12-10 NOTE — Progress Notes (Signed)
History and Physical Interval Note: Patient seen on 11/15/20 by PA esterwood without significant interval change. She states dysphagia is improved and quite mild, PPI working to improve that. Main complaint is abdominal bloating. No cardiopulmonary symptoms. She wishes to proceed with EGD and colonoscopy, further recommendations pending the results, all questions answered. She understands risks / benefits of these exams - EGD / colonoscopy.    12/10/2020 3:00 PM  Janet Pierce  has presented today for endoscopic procedure(s), with the diagnosis of  Encounter Diagnoses  Name Primary?   Chronic GERD Yes   Dysphagia, unspecified type    Abdominal distention    Iron deficiency anemia, unspecified iron deficiency anemia type    Colon cancer screening   .  The various methods of evaluation and treatment have been discussed with the patient and/or family. After consideration of risks, benefits and other options for treatment, the patient has consented to  the endoscopic procedure(s).   The patient's history has been reviewed, patient examined, no change in status, stable for surgery.  I have reviewed the patient's chart and labs.  Questions were answered to the patient's satisfaction.    Jolly Mango, MD [facility]

## 2020-12-10 NOTE — Op Note (Signed)
[place] Patient Name: Janet Pierce Procedure Date: 12/10/2020 2:53 PM MRN: [id number] Endoscopist: Remo Lipps P. Havery Moros , MD Age: 70 Referring MD:  Date of Birth: 01-31-1951 Gender: Female Account #: [id number] Procedure:                Colonoscopy Indications:              Abdominal distension / bloating, Iron deficiency,                            last colonoscopy age 80 - screening Medicines:                Monitored Anesthesia Care Procedure:                Pre-Anesthesia Assessment:                           - Prior to the procedure, a History and Physical                            was performed, and patient medications and                            allergies were reviewed. The patient's tolerance of                            previous anesthesia was also reviewed. The risks                            and benefits of the procedure and the sedation                            options and risks were discussed with the patient.                            All questions were answered, and informed consent                            was obtained. Prior Anticoagulants: The patient has                            taken no previous anticoagulant or antiplatelet                            agents. ASA Grade Assessment: II - A patient with                            mild systemic disease. After reviewing the risks                            and benefits, the patient was deemed in                            satisfactory condition to undergo the procedure.  After obtaining informed consent, the colonoscope                            was passed under direct vision. Throughout the                            procedure, the patient's blood pressure, pulse, and                            oxygen saturations were monitored continuously. The                            Olympus [id number]-[id number]) Colonoscope was                            introduced through the  anus and advanced to the the                            terminal ileum, with identification of the                            appendiceal orifice and IC valve. The colonoscopy                            was performed without difficulty. The patient                            tolerated the procedure well. The quality of the                            bowel preparation was adequate. The terminal ileum,                            ileocecal valve, appendiceal orifice, and rectum                            were photographed. Scope In: 3:17:10 PM Scope Out: 3:36:55 PM Scope Withdrawal Time: 0 hours 15 minutes 6 seconds  Total Procedure Duration: 0 hours 19 minutes 45 seconds  Findings:                 The perianal and digital rectal examinations were                            normal.                           The limited views of the terminal ileum appeared                            normal.                           Two sessile polyps were found in the ascending  colon. The polyps were 2 to 4 mm in size. These                            polyps were removed with a cold snare. Resection                            and retrieval were complete.                           Multiple medium-mouthed diverticula were found in                            the sigmoid colon.                           A 3 mm polyp was found in the sigmoid colon. The                            polyp was sessile. The polyp was removed with a                            cold snare. Resection and retrieval were complete.                           Internal hemorrhoids were found during retroflexion.                           The exam was otherwise without abnormality. Complications:            No immediate complications. Estimated blood loss:                            Minimal. Estimated Blood Loss:     Estimated blood loss was minimal. Impression:               - The examined portion of the ileum was  normal.                           - Two 2 to 4 mm polyps in the ascending colon,                            removed with a cold snare. Resected and retrieved.                           - Diverticulosis in the sigmoid colon.                           - One 3 mm polyp in the sigmoid colon, removed with                            a cold snare. Resected and retrieved.                           - Internal hemorrhoids.                           -  The examination was otherwise normal.                           Overall, no concerning pathology to cause the                            patient's symptoms Recommendation:           - Patient has a contact number available for                            emergencies. The signs and symptoms of potential                            delayed complications were discussed with the                            patient. Return to normal activities tomorrow.                            Written discharge instructions were provided to the                            patient.                           - Resume previous diet.                           - Continue present medications.                           - Await pathology results from both EGD and                            colonoscopy. If EGD biopsies negative,                            consideration for empiric Rifaximin given                            significant bloating symptoms. Patient has tried                            dietary changes and simethicone so far [employer]. Havery Moros, MD 12/10/2020 3:44:45 PM This report has been signed electronically.

## 2020-12-10 NOTE — Progress Notes (Signed)
Report to PACU, RN, vss, BBS= Clear.  

## 2020-12-10 NOTE — Op Note (Signed)
Janet Pierce: Janet Pierce Procedure Date: 12/10/2020 2:54 PM MRN: [id number] Endoscopist: Remo Lipps P. Havery Moros , MD Age: 70 Referring MD:  Date of Birth: 1950-05-21 Gender: Female Account #: [id number] Procedure:                Upper GI endoscopy Indications:              Dysphagia, Follow-up of gastro-esophageal reflux                            disease, Abdominal bloating, low iron saturation -                            dysphagia mild and not bothering the patient                            currently. CT scan negative Medicines:                Monitored Anesthesia Care Procedure:                Pre-Anesthesia Assessment:                           - Prior to the procedure, a History and Physical                            was performed, and patient medications and                            allergies were reviewed. The patient's tolerance of                            previous anesthesia was also reviewed. The risks                            and benefits of the procedure and the sedation                            options and risks were discussed with the patient.                            All questions were answered, and informed consent                            was obtained. Prior Anticoagulants: The patient has                            taken no previous anticoagulant or antiplatelet                            agents. ASA Grade Assessment: II - A patient with                            mild systemic disease. After reviewing the risks  and benefits, the patient was deemed in                            satisfactory condition to undergo the procedure.                           After obtaining informed consent, the endoscope was                            passed under direct vision. Throughout the                            procedure, the patient's blood pressure, pulse, and                            oxygen saturations were  monitored continuously. The                            GIF [id number] was introduced through the                            mouth, and advanced to the second part of duodenum.                            The upper GI endoscopy was accomplished without                            difficulty. The patient tolerated the procedure                            well. Scope In: Scope Out: Findings:                 Esophagogastric landmarks were identified: the                            Z-line was found at 37 cm, the gastroesophageal                            junction was found at 37 cm and the upper extent of                            the gastric folds was found at 38 cm from the                            incisors.                           A 1 cm hiatal hernia was present.                           The exam of the esophagus was otherwise normal. No                            stenosis /  stricture noted.                           Patchy mild inflammation characterized by a few                            erosions, erythema and friability was found in the                            gastric antrum.                           The exam of the stomach was otherwise normal.                           Biopsies were taken with a cold forceps in the                            gastric body, at the incisura and in the gastric                            antrum for Helicobacter pylori testing.                           One benign lymphangiectasia was present in the                            second portion of the duodenum.                           The exam of the duodenum was otherwise normal.                           Biopsies for histology were taken with a cold                            forceps in the duodenal bulb and in the second                            portion of the duodenum for evaluation of celiac                            disease. Complications:            No immediate complications.  Estimated blood loss:                            Minimal. Estimated Blood Loss:     Estimated blood loss was minimal. Impression:               - Esophagogastric landmarks identified.                           - 1 cm hiatal hernia.                           -  Normal esophagus otherwise - no stenosis /                            stricture noted. Empiric dilation not done since                            symptoms mild / resolved.                           - Mild antral gastritis.                           - Normal stomach otherwise - biopsies taken to rule                            out H pylori                           - Duodenal mucosal lymphangiectasia.                           - Normal duodenum otherwise. Biopsies were taken                            with a cold forceps for evaluation of celiac                            disease. Recommendation:           - Patient has a contact number available for                            emergencies. The signs and symptoms of potential                            delayed complications were discussed with the                            patient. Return to normal activities tomorrow.                            Written discharge instructions were provided to the                            patient.                           - Resume previous diet.                           - Continue present medications including protonix                           - Avoid NSAIDs                           - Await pathology results with further  recommendations. If no cause for symptoms on                            biopsies, may consider empiric Rifaximin for                            significant bloating / distension symptoms Sadiyah Kangas P. Havery Moros, MD 12/10/2020 4:00:57 PM This report has been signed electronically.

## 2020-12-10 NOTE — Patient Instructions (Addendum)
Handouts given:  polyps, hemorrhoids, diverticulosis, gastritis Resume previous diet Continue current medications Await pathology results Avoid NSAID's: ibuprofen, motrin, aleve, goody powder  YOU HAD AN ENDOSCOPIC PROCEDURE TODAY AT [facility]:   Refer to the procedure report that was given to you for any specific questions about what was found during the examination.  If the procedure report does not answer your questions, please call your gastroenterologist to clarify.  If you requested that your care partner not be given the details of your procedure findings, then the procedure report has been included in a sealed envelope for you to review at your convenience later.  YOU SHOULD EXPECT: Some feelings of bloating in the abdomen. Passage of more gas than usual.  Walking can help get rid of the air that was put into your GI tract during the procedure and reduce the bloating. If you had a lower endoscopy (such as a colonoscopy or flexible sigmoidoscopy) you may notice spotting of blood in your stool or on the toilet paper. If you underwent a bowel prep for your procedure, you may not have a normal bowel movement for a few days.  Please Note:  You might notice some irritation and congestion in your nose or some drainage.  This is from the oxygen used during your procedure.  There is no need for concern and it should clear up in a day or so.  SYMPTOMS TO REPORT IMMEDIATELY:  Following lower endoscopy (colonoscopy or flexible sigmoidoscopy):  Excessive amounts of blood in the stool  Significant tenderness or worsening of abdominal pains  Swelling of the abdomen that is new, acute  Fever of 100F or higher  Following upper endoscopy (EGD)  Vomiting of blood or coffee ground material  New chest pain or pain under the shoulder blades  Painful or persistently difficult swallowing  New shortness of breath  Fever of 100F or higher  Black, tarry-looking stools  For urgent or  emergent issues, a gastroenterologist can be reached at any hour by calling [phone]. Do not use MyChart messaging for urgent concerns.   DIET:  We do recommend a small meal at first, but then you may proceed to your regular diet.  Drink plenty of fluids but you should avoid alcoholic beverages for 24 hours.  ACTIVITY:  You should plan to take it easy for the rest of today and you should NOT DRIVE or use heavy machinery until tomorrow (because of the sedation medicines used during the test).    FOLLOW UP: Our staff will call the number listed on your records 48-72 hours following your procedure to check on you and address any questions or concerns that you may have regarding the information given to you following your procedure. If we do not reach you, we will leave a message.  We will attempt to reach you two times.  During this call, we will ask if you have developed any symptoms of COVID 19. If you develop any symptoms (ie: fever, flu-like symptoms, shortness of breath, cough etc.) before then, please call [phone].  If you test positive for Covid 19 in the 2 weeks post procedure, please call and report this information to [place].    If any biopsies were taken you will be contacted by phone or by letter within the next 1-3 weeks.  Please call us at [phone] if you have not heard about the biopsies in 3 weeks.   SIGNATURES/CONFIDENTIALITY: You and/or your care partner have signed paperwork which will  be entered into your electronic medical record.  These signatures attest to the fact that that the information above on your After Visit Summary has been reviewed and is understood.  Full responsibility of the confidentiality of this discharge information lies with you and/or your care-partner.

## 2020-12-11 ENCOUNTER — Telehealth: Payer: Self-pay | Admitting: Family Medicine

## 2020-12-11 NOTE — Telephone Encounter (Signed)
Please advise, pt reports feels UTI still present would like new course abx. New OV needed?

## 2020-12-11 NOTE — Telephone Encounter (Signed)
Patient called and states that she feels like she still has the UTI that Richard treated her for last week.  She would like more antibiotic called in - Please advise.

## 2020-12-12 ENCOUNTER — Telehealth: Payer: Self-pay

## 2020-12-12 NOTE — Telephone Encounter (Signed)
Patient returning missed call to [facility] to discuss her concerns.. Plz advise  thank you

## 2020-12-12 NOTE — Telephone Encounter (Signed)
  Follow up Call-  Call back number 12/10/2020  Post procedure Call Back phone  # [phone]  Permission to leave phone message Yes  Some recent data might be hidden     Patient questions:  Do you have a fever, pain , Yes.  or abdominal swelling?  Pain Score  3 * patient complains of some pain in the middle of her back. States that it kept her up some in the night, but that it is better this morning. Patient took tylenol and applied heat which seemed to help with the pain. Instructed patient to call back if anything changed or if pain became worse.   Have you tolerated food without any problems? Yes.    Have you been able to return to your normal activities? Yes.    Do you have any questions about your discharge instructions: Diet   No. Medications  No. Follow up visit  No.  Do you have questions or concerns about your Care? No.  Actions: * If pain score is 4 or above: No action needed, pain <4.

## 2020-12-12 NOTE — Telephone Encounter (Signed)
Pt called back and states that she is still having the same pain.  She says that her pain is better but it is still there.  We discussed the residual air that can happen after both procedures.  She has tried heat and warm liquids as well as Tylenol.  She was concerned that she could have a hole in her esophagus.  I reassured her that she would be having much worse symptoms if this were the case.  Advised her that I would send a note to Dr. Havery Moros to make him aware.

## 2020-12-12 NOTE — Telephone Encounter (Signed)
I called the patient back to inquire further about her symptoms.  She has discomfort in the middle of her back.  No chest pain.  No shortness of breath.  No abdominal pain.  No fevers.  She is eating and drinking well without any odynophagia or pain.  She has had back pain in the past, although this is in a slightly different spot.  She has applied some heating pad and Tylenol and states that seems to be helping so far.  No high risk interventions done during the procedure, I suspect this is more than likely musculoskeletal pain.  That being said the office is about to close today, if she has worsening back pain pain, shortness of breath or chest pain, fever etc. despite these measures her option would be to go to the hospital overnight.  She will take her blood pressure and vitals at home to make sure stable.  She should call me back if any worsening overnight but again I would recommend that she go to the hospital in that setting.  Our staff will touch base with her in the morning to make sure she is progressing and doing better.  She agrees with the plan, all questions answered.  [place] can you please contact this patient to the morning to check on her to make sure she is feeling better?  Thanks

## 2020-12-13 NOTE — Telephone Encounter (Signed)
Lm on mobile vm for patient to return call °

## 2020-12-17 ENCOUNTER — Other Ambulatory Visit: Payer: Self-pay | Admitting: Family Medicine

## 2020-12-17 DIAGNOSIS — Z1231 Encounter for screening mammogram for malignant neoplasm of breast: Secondary | ICD-10-CM

## 2020-12-17 NOTE — Telephone Encounter (Signed)
Spoke with patient, she states that she is 100% better. She states that she thinks it was her back due to her scoliosis. She was very thankful for the follow up call.

## 2020-12-19 ENCOUNTER — Other Ambulatory Visit: Payer: Self-pay

## 2020-12-19 DIAGNOSIS — D509 Iron deficiency anemia, unspecified: Secondary | ICD-10-CM

## 2020-12-19 DIAGNOSIS — R140 Abdominal distension (gaseous): Secondary | ICD-10-CM

## 2020-12-19 MED ORDER — RIFAXIMIN 550 MG PO TABS
550.0000 mg | ORAL_TABLET | Freq: Three times a day (TID) | ORAL | 0 refills | Status: AC
Start: 2020-12-19 — End: 2021-01-02

## 2021-01-01 NOTE — Telephone Encounter (Signed)
Pt returned call, we have reviewed her pathology results and recommendations. Advised patient that I will send the information to her my chart as well. Patient verbalized understanding of all information and had no concerns at the end of the call.

## 2021-01-11 ENCOUNTER — Telehealth: Payer: Self-pay

## 2021-01-11 NOTE — Progress Notes (Signed)
Rec'd fax from [facility] that they have tried to reach patient multiple times and have left messages but the patient has not returned their call.  They are placing the Xifaxan script on hold until they hear back from her.

## 2021-01-11 NOTE — Telephone Encounter (Signed)
Received a fax from [facility] stating that they have left several messages for the patient to give them a call, pt has not returned call. They will wait further response from patient.

## 2021-01-14 NOTE — Progress Notes (Signed)
Established Patient Office Visit  Subjective:  Patient ID: Janet Pierce, female    DOB: [birth year]  Age: 70 y.o. MRN: [id number]  CC:  Chief Complaint  Patient presents with   Abdominal Pain    Patient states she came in June her iron was low and she states soon as she started taking ron pills she has now started having more stomach pain , gas , left back and think its maybe an infection.    HPI Janet Pierce presents for abdominal pain  Lower midline Some flank pain  Denies urinary symptoms  Does note she has started iron supplements recently. Feeling more bloated, more flatulence.  No nvd, no melena, no brbpr, denies constipation.  No vaginal symptoms  No clear patterns based on diet  Otherwise no concerns.  Past Medical History:  Diagnosis Date   Allergy    Anemia    Anxiety    Arthritis    Cataract    GERD (gastroesophageal reflux disease)    Glaucoma    Hyperlipidemia    Hypertension    Osteoporosis    Retinal detachment    left eye prosthesis   Scoliosis     Past Surgical History:  Procedure Laterality Date   BUNIONECTOMY Left    CATARACT EXTRACTION Right    EYE SURGERY Right    glucoma   FINGER SURGERY Right    ring finger   LIPOMA EXCISION     hip   RETINAL DETACHMENT SURGERY Left    TONSILLECTOMY      Family History  Problem Relation Age of Onset   Lung cancer Mother    Hypertension Mother    Bone cancer Mother    Liver cancer Mother    Other Sister        brain tumor   Hypertension Brother    Diabetes Maternal Grandmother    Fibroids Daughter    Colon cancer Neg Hx    Colon polyps Neg Hx    Esophageal cancer Neg Hx    Rectal cancer Neg Hx    Stomach cancer Neg Hx     Social History   Socioeconomic History   Marital status: Legally Separated    Spouse name: Not on file   Number of children: 2   Years of education: Not on file   Highest education level: Not on file  Occupational History   Occupation: Press photographer  associate/retired    Comment: [employer]  Tobacco Use   Smoking status: Former    Packs/day: 0.25    Types: Cigarettes    Quit date: 11/23/2016    Years since quitting: 4.1   Smokeless tobacco: Never   Tobacco comments:    stopped by using nicorette gum. has completely stopped  Vaping Use   Vaping Use: Never used  Substance and Sexual Activity   Alcohol use: Yes    Alcohol/week: 0.0 standard drinks    Comment: occasionally   Drug use: No   Sexual activity: Not on file  Other Topics Concern   Not on file  Social History Narrative   Not on file   Social Determinants of Health   Financial Resource Strain: Low Risk    Difficulty of Paying Living Expenses: Not hard at all  Food Insecurity: No Food Insecurity   Worried About Charity fundraiser in the Last Year: Never true   [place] in the Last Year: Never true  Transportation Needs: No Transportation Needs   Lack of Transportation (  Medical): No   Lack of Transportation (Non-Medical): No  Physical Activity: Insufficiently Active   Days of Exercise per Week: 2 days   Minutes of Exercise per Session: 60 min  Stress: No Stress Concern Present   Feeling of Stress : Not at all  Social Connections: Moderately Isolated   Frequency of Communication with Friends and Family: More than three times a week   Frequency of Social Gatherings with Friends and Family: More than three times a week   Attends Religious Services: Never   Marine scientist or Organizations: Yes   Attends Music therapist: More than 4 times per year   Marital Status: Separated  Intimate Partner Violence: Not At Risk   Fear of Current or Ex-Partner: No   Emotionally Abused: No   Physically Abused: No   Sexually Abused: No    Outpatient Medications Prior to Visit  Medication Sig Dispense Refill   atorvastatin (LIPITOR) 20 MG tablet TAKE 1 TABLET DAILY 90 tablet 3   brimonidine (ALPHAGAN P) 0.1 % SOLN Place 1 drop into the right eye in  the morning and at bedtime.     dorzolamide (TRUSOPT) 2 % ophthalmic solution INSTILL 1 DROP INTO BOTH EYES BID  1   LUMIGAN 0.01 % SOLN 1 drop at bedtime.     Multiple Vitamins-Minerals (MULTIVITAMIN GUMMIES WOMENS) CHEW Chew 2 Doses by mouth every morning.     pantoprazole (PROTONIX) 40 MG tablet TAKE 1 TABLET TWICE A DAY 60 tablet 3   simethicone (MYLICON) 315 MG chewable tablet Chew 125 mg by mouth every 6 (six) hours as needed for flatulence.     timolol (TIMOPTIC) 0.5 % ophthalmic solution Place 1 drop into the right eye 2 (two) times daily.      tiZANidine (ZANAFLEX) 4 MG tablet Take 1 tablet (4 mg total) by mouth every 8 (eight) hours as needed for muscle spasms. 30 tablet 0   Vitamin D, Ergocalciferol, (DRISDOL) 1.25 MG (50000 UNIT) CAPS capsule Take 1 capsule (50,000 Units total) by mouth every 7 (seven) days. 12 capsule 0   amLODipine (NORVASC) 10 MG tablet TAKE 1 TABLET DAILY BEFORE BREAKFAST 90 tablet 3   No facility-administered medications prior to visit.    No Known Allergies  ROS Review of Systems Per hpi     Objective:    Physical Exam Vitals and nursing note reviewed.  Constitutional:      General: She is not in acute distress.    Appearance: Normal appearance. She is normal weight. She is not ill-appearing, toxic-appearing or diaphoretic.  Cardiovascular:     Rate and Rhythm: Normal rate and regular rhythm.     Heart sounds: Normal heart sounds. No murmur heard.   No friction rub. No gallop.  Pulmonary:     Effort: Pulmonary effort is normal. No respiratory distress.     Breath sounds: Normal breath sounds. No stridor. No wheezing, rhonchi or rales.  Chest:     Chest wall: No tenderness.  Abdominal:     General: Abdomen is flat and scaphoid. Bowel sounds are normal. There is no distension or abdominal bruit. There are no signs of injury.     Palpations: Abdomen is soft.     Tenderness: There is no abdominal tenderness. There is no right CVA tenderness or  left CVA tenderness.  Skin:    General: Skin is warm and dry.  Neurological:     General: No focal deficit present.     Mental Status:  She is alert and oriented to person, place, and time. Mental status is at baseline.  Psychiatric:        Mood and Affect: Mood normal.        Behavior: Behavior normal.        Thought Content: Thought content normal.        Judgment: Judgment normal.    BP 115/66   Pulse 95   Temp 98 F (36.7 C) (Temporal)   Resp 16   Ht 5\' 4"  (1.626 m)   Wt 145 lb 6.4 oz (66 kg)   SpO2 96%   BMI 24.96 kg/m  Wt Readings from Last 3 Encounters:  12/10/20 143 lb (64.9 kg)  11/30/20 145 lb 6.4 oz (66 kg)  11/15/20 143 lb 12.8 oz (65.2 kg)     There are no preventive care reminders to display for this patient.  There are no preventive care reminders to display for this patient.  Lab Results  Component Value Date   TSH 2.15 08/08/2020   Lab Results  Component Value Date   WBC 4.8 11/15/2020   HGB 11.7 (L) 11/15/2020   HCT 34.5 (L) 11/15/2020   MCV 82.9 11/15/2020   PLT 254.0 11/15/2020   Lab Results  Component Value Date   NA 140 11/15/2020   K 3.6 11/15/2020   CO2 30 11/15/2020   GLUCOSE 116 (H) 11/15/2020   BUN 12 11/15/2020   CREATININE 0.76 11/15/2020   BILITOT 0.5 08/08/2020   ALKPHOS 61 08/08/2020   AST 13 08/08/2020   ALT 11 08/08/2020   PROT 7.5 08/08/2020   ALBUMIN 4.1 08/08/2020   CALCIUM 9.5 11/15/2020   ANIONGAP 5 07/15/2016   GFR 79.82 11/15/2020   Lab Results  Component Value Date   CHOL 171 02/09/2020   Lab Results  Component Value Date   HDL 53.80 02/09/2020   Lab Results  Component Value Date   LDLCALC 101 (H) 02/09/2020   Lab Results  Component Value Date   TRIG 81.0 02/09/2020   Lab Results  Component Value Date   CHOLHDL 3 02/09/2020   Lab Results  Component Value Date   HGBA1C 5.9 08/08/2020      Assessment & Plan:   Problem List Items Addressed This Visit   None Visit Diagnoses      Frequent urination at night    -  Primary   Relevant Orders   POCT urinalysis dipstick (Completed)   Urine Culture (Completed)       No orders of the defined types were placed in this encounter.   Follow-up: Return if symptoms worsen or fail to improve.   PLAN POCT urinalysis shows likely UTI. Will treat.  Send out culture to ensure accurate treatment. If not, will adjust Reviewed reasons to return to clinic with patient Reviewed nonpharm management Patient encouraged to call clinic with any questions, comments, or concerns.  Maximiano Coss, NP

## 2021-01-23 ENCOUNTER — Ambulatory Visit
Admission: RE | Admit: 2021-01-23 | Discharge: 2021-01-23 | Disposition: A | Discharge status: Home / Self Care | Payer: Medicare Other | Source: Ambulatory Visit | Attending: Family Medicine | Admitting: Family Medicine

## 2021-01-23 ENCOUNTER — Other Ambulatory Visit: Payer: Self-pay

## 2021-01-23 DIAGNOSIS — Z1231 Encounter for screening mammogram for malignant neoplasm of breast: Secondary | ICD-10-CM

## 2021-01-25 ENCOUNTER — Ambulatory Visit: Payer: Medicare Other | Admitting: Registered Nurse

## 2021-01-29 ENCOUNTER — Ambulatory Visit: Payer: Medicare Other | Admitting: Family Medicine

## 2021-02-10 NOTE — Telephone Encounter (Signed)
Would need follow up OV unless culture indicates different treatment - late reply, but for future reference  Thank you  Rich

## 2021-02-10 NOTE — Telephone Encounter (Signed)
This concern has been previously addressed by myself and/or another provider.  If they patient has ongoing concerns, they can contact me at their convenience.  Thank you,  Rich Ariday Brinker, NP 

## 2021-02-18 ENCOUNTER — Telehealth: Payer: Self-pay

## 2021-02-18 NOTE — Telephone Encounter (Signed)
Spoke with patient to remind her that she is due for repeat labs at this time. No appointment is necessary. Patient is aware that she can stop by the lab in the basement at her convenience between 7:30 AM - 5 PM, Monday through Friday. Patient verbalized understanding and had no concerns at the end of the call.   

## 2021-02-18 NOTE — Telephone Encounter (Signed)
-----   Message from Yevette Edwards, RN sent at 12/19/2020  3:35 PM EDT ----- Regarding: Labs CBC, TIBC/Ferritin - anemia. Orders in epic.

## 2021-02-20 ENCOUNTER — Telehealth: Payer: Self-pay | Admitting: Physical Medicine and Rehabilitation

## 2021-02-20 NOTE — Telephone Encounter (Signed)
Patient called. She would like an appointment with Dr. Ernestina Patches. Her call back number is [phone]

## 2021-02-20 NOTE — Telephone Encounter (Signed)
Scheduled for OV. 

## 2021-02-20 NOTE — Telephone Encounter (Signed)
Left L4-5 and L5-S1 MBB in February of 2022. Called patient to schedule OV. No answer and mailbox is full.

## 2021-02-20 NOTE — Telephone Encounter (Signed)
See previous message

## 2021-02-20 NOTE — Telephone Encounter (Signed)
Patient called. Returning a call to [place].

## 2021-02-21 ENCOUNTER — Other Ambulatory Visit (INDEPENDENT_AMBULATORY_CARE_PROVIDER_SITE_OTHER): Payer: Medicare Other

## 2021-02-21 DIAGNOSIS — D509 Iron deficiency anemia, unspecified: Secondary | ICD-10-CM

## 2021-02-21 LAB — CBC WITH DIFFERENTIAL/PLATELET
Basophils Absolute: 0.1 10*3/uL (ref 0.0–0.1)
Basophils Relative: 1.1 % (ref 0.0–3.0)
Eosinophils Absolute: 0.1 10*3/uL (ref 0.0–0.7)
Eosinophils Relative: 3.1 % (ref 0.0–5.0)
HCT: 37.2 % (ref 36.0–46.0)
Hemoglobin: 12.3 g/dL (ref 12.0–15.0)
Lymphocytes Relative: 41.8 % (ref 12.0–46.0)
Lymphs Abs: 1.9 10*3/uL (ref 0.7–4.0)
MCHC: 33.1 g/dL (ref 30.0–36.0)
MCV: 83.3 fl (ref 78.0–100.0)
Monocytes Absolute: 0.3 10*3/uL (ref 0.1–1.0)
Monocytes Relative: 7 % (ref 3.0–12.0)
Neutro Abs: 2.1 10*3/uL (ref 1.4–7.7)
Neutrophils Relative %: 47 % (ref 43.0–77.0)
Platelets: 281 10*3/uL (ref 150.0–400.0)
RBC: 4.46 Mil/uL (ref 3.87–5.11)
RDW: 13.6 % (ref 11.5–15.5)
WBC: 4.5 10*3/uL (ref 4.0–10.5)

## 2021-02-22 ENCOUNTER — Other Ambulatory Visit: Payer: Self-pay

## 2021-02-22 DIAGNOSIS — D509 Iron deficiency anemia, unspecified: Secondary | ICD-10-CM

## 2021-02-22 LAB — IRON,TIBC AND FERRITIN PANEL
%SAT: 20 % (calc) (ref 16–45)
Ferritin: 39 ng/mL (ref 16–288)
Iron: 75 ug/dL (ref 45–160)
TIBC: 381 mcg/dL (calc) (ref 250–450)

## 2021-02-25 ENCOUNTER — Other Ambulatory Visit: Payer: Self-pay

## 2021-02-25 ENCOUNTER — Ambulatory Visit: Payer: Medicare Other | Admitting: Physical Medicine and Rehabilitation

## 2021-02-25 ENCOUNTER — Telehealth: Payer: Self-pay | Admitting: Physical Medicine and Rehabilitation

## 2021-02-25 ENCOUNTER — Encounter: Payer: Self-pay | Admitting: Physical Medicine and Rehabilitation

## 2021-02-25 VITALS — BP 132/87 | HR 82

## 2021-02-25 DIAGNOSIS — M47816 Spondylosis without myelopathy or radiculopathy, lumbar region: Secondary | ICD-10-CM | POA: Diagnosis not present

## 2021-02-25 DIAGNOSIS — M48061 Spinal stenosis, lumbar region without neurogenic claudication: Secondary | ICD-10-CM | POA: Diagnosis not present

## 2021-02-25 NOTE — Telephone Encounter (Signed)
Pt called asking to see if there are any times later than 10 for today? If not she states she will have to reschedule.   [phone]

## 2021-02-25 NOTE — Progress Notes (Signed)
Janet Pierce - 70 y.o. female MRN [id number]  Date of birth: 1950/06/23  Office Visit Note: Visit Date: 02/25/2021 PCP: Midge Minium, MD Referred by: Midge Minium, MD  Subjective: Chief Complaint  Patient presents with   Lower Back - Pain   Left Hip - Pain   HPI: Janet Pierce is a 70 y.o. female who comes in today for evaluation of chronic, worsening and severe left sided lower back pain radiating to buttock and hip. Patient reports pain has been chronic for several years. Patient had left L4-L5 and L5-S1 facet joint injections on 05/30/2020 which she reports gave her good and sustained pain relief until the last several weeks. Patient states pain is exacerbated by prolonged standing and laying flat. Patient reports her pain is more severe in the mornings. She describes pain as soreness and throbbing sensation, currently rates as 8 out of 10. Patient reports some relief of pain with home exercise regimen, topical pain creams, heating pad and rest. Patient states she did attend formal physical therapy for chronic left sided lower back issues in 2017 at [facility]. Patient states this helped her pain significantly and states that she would like to go back if possible. Patient's lumbar MRI from 2020 exhibits advanced bilateral facet degenerative changes that are worse on the left at L4-L5 and bilateral facet degenerative changes that are worse on the left at L5-S1. No high grade spinal canal stenosis noted. Patient states she is a very active person and does attend a bowling league once a week. Patient states she is going out of town on vacation in December and if all possible would like to get injections before she leaves. Patient denies focal weakness, numbness and tingling. Patient denies recent trauma or falls.      Review of Systems  Musculoskeletal:  Positive for back pain.  Neurological:  Negative for tingling, sensory change, focal weakness  and weakness.  All other systems reviewed and are negative. Otherwise per HPI.  Assessment & Plan: Visit Diagnoses:    ICD-10-CM   1. Spondylosis without myelopathy or radiculopathy, lumbar region  M47.816 Ambulatory referral to Physical Medicine Rehab    Ambulatory referral to Physical Therapy    2. Facet arthropathy, lumbar  M47.816     3. Foraminal stenosis of lumbar region  M48.061        Plan: Findings:  Chronic, worsening and severe left sided back pain radiating to buttock and hip. Good and sustained relief until recently from left L4-L5 and L5-S1 facet joint injections in February. Patient continues to have excruciating pain despite good conservative therapies such as formal physical therapy, home exercise regimen, topical creams and heating pad. Patient's clinical presentation and exam are consistent with facet mediated pain. We feel the next step is to repeat left L4-L5 and L5-S1 facet joint injections under fluoroscopic guidance. Patient did voice concern today that she would like to return to physical therapy and the [place] location is convenient to her home. We did place a referral today for physical therapy at [place]. We did briefly discuss possibility of performing radiofrequency ablation if facet joint injection pain relief becomes short lived. We feel that we can get patient in quickly for her injections. We encouraged her to remain active and continue with her bowling league as tolerated. Patient instructed to follow-up with [place] as needed. No red flag symptoms noted upon exam today.    Meds & Orders: No  orders of the defined types were placed in this encounter.   Orders Placed This Encounter  Procedures   Ambulatory referral to Physical Medicine Rehab   Ambulatory referral to Physical Therapy     Follow-up: Return in about 1 week (around 03/04/2021) for Left L4-L5 and L5-S1 facet joint injections .   Procedures: No procedures  performed      Clinical History: MRI LUMBAR SPINE WITHOUT CONTRAST   TECHNIQUE: Multiplanar, multisequence MR imaging of the lumbar spine was performed. No intravenous contrast was administered.   COMPARISON:  Plain films lumbar spine from [facility] 11/02/2018.   FINDINGS: Segmentation:  Standard.   Alignment: Severe convex left scoliosis with the apex at T12-L1. Facet degenerative change results in 0.6 cm anterolisthesis L4 on L5 and trace anterolisthesis L5 on S1.   Vertebrae: No fracture or worrisome lesion. Degenerative endplate signal change at L4-5 is eccentric to the left.   Conus medullaris and cauda equina: Conus extends to the L2 level. Conus and cauda equina appear normal.   Paraspinal and other soft tissues: Negative.   Disc levels:   T12-L1: Right worse than left facet degenerative change. No disc bulge or protrusion. No stenosis.   L1-2: Bilateral facet degenerative disease appears worse on the right. Minimal disc bulge without stenosis.   L2-3: Facet arthropathy.  Otherwise negative.   L3-4: Mild-to-moderate facet degenerative change. Otherwise negative.   L4-5: Advanced bilateral facet degenerative disease is worse on the left. The disc is uncovered with a shallow bulge. There is mild central canal and left subarticular recess narrowing. The right foramen is open. Marked left foraminal narrowing is present. The exiting left L4 root appears compressed in the foramen   L5-S1: Slight disc uncovering. Bilateral facet degenerative change is worse on the left. The central canal and foramina are widely patent.   IMPRESSION: Given left leg symptoms, dominant finding is at L4-5 where anterolisthesis, disc and facet arthropathy cause marked left foraminal narrowing. The exiting left L4 root appears compressed in the foramen.   Negative for central canal stenosis.   Severe convex left thoracolumbar scoliosis.     Electronically Signed    By: Inge Rise M.D.   On: 12/14/2018 15:59   She reports that she quit smoking about 4 years ago. Her smoking use included cigarettes. She smoked an average of .25 packs per day. She has never used smokeless tobacco.  Recent Labs    08/08/20 1311  HGBA1C 5.9    Objective:  VS:  HT:    WT:   BMI:     BP:132/87  HR:82bpm  TEMP: ( )  RESP:  Physical Exam Vitals and nursing note reviewed.  HENT:     Head: Normocephalic and atraumatic.     Right Ear: External ear normal.     Left Ear: External ear normal.     Nose: Nose normal.     Mouth/Throat:     Mouth: Mucous membranes are moist.  Eyes:     Extraocular Movements: Extraocular movements intact.  Cardiovascular:     Rate and Rhythm: Normal rate.     Pulses: Normal pulses.  Pulmonary:     Effort: Pulmonary effort is normal.  Abdominal:     General: Abdomen is flat. There is no distension.  Musculoskeletal:        General: Tenderness present.     Cervical back: Normal range of motion.     Comments: Pt is slow to rise from seated position to standing. Pain  noted upon facet loading. Strong distal strength without clonus, no pain upon palpation of greater trochanters. Sensation intact bilaterally. Walks independently, gait steady.     Skin:    General: Skin is warm and dry.     Capillary Refill: Capillary refill takes less than 2 seconds.  Neurological:     General: No focal deficit present.     Mental Status: She is alert.  Psychiatric:        Mood and Affect: Mood normal.    Ortho Exam  Imaging: No results found.  Past Medical/Family/Surgical/Social History: Medications & Allergies reviewed per EMR, new medications updated. Patient Active Problem List   Diagnosis Date Noted   Osteoporosis 09/28/2019   Vitamin D deficiency 07/08/2018   Benign paroxysmal positional vertigo 05/30/2015   Bunion, right foot 06/01/2014   Tobacco abuse 11/25/2013   Dysphagia 05/19/2013   Special screening for malignant  neoplasms, colon 05/19/2013   GERD (gastroesophageal reflux disease) 04/04/2013   Allergic rhinitis 04/04/2013   General medical examination 09/11/2010   PREMATURE VENTRICULAR CONTRACTIONS 11/06/2009   SUPRAVENTRICULAR TACHYCARDIA 10/05/2009   LEUKOCYTOPENIA UNSPECIFIED 09/10/2009   DEPRESSIVE DISORDER 09/06/2009   ECZEMA 09/06/2009   PALPITATIONS, OCCASIONAL 06/08/2009   SHOULDER PAIN, LEFT 05/24/2009   BACK PAIN 05/24/2009   SCOLIOSIS 05/24/2009   LIPOMA 02/02/2008   Hyperlipidemia 02/02/2008   HYPERTENSION, BENIGN ESSENTIAL 02/02/2008   Past Medical History:  Diagnosis Date   Allergy    Anemia    Anxiety    Arthritis    Cataract    GERD (gastroesophageal reflux disease)    Glaucoma    Hyperlipidemia    Hypertension    Osteoporosis    Retinal detachment    left eye prosthesis   Scoliosis    Family History  Problem Relation Age of Onset   Lung cancer Mother    Hypertension Mother    Bone cancer Mother    Liver cancer Mother    Other Sister        brain tumor   Hypertension Brother    Diabetes Maternal Grandmother    Fibroids Daughter    Colon cancer Neg Hx    Colon polyps Neg Hx    Esophageal cancer Neg Hx    Rectal cancer Neg Hx    Stomach cancer Neg Hx    Past Surgical History:  Procedure Laterality Date   BUNIONECTOMY Left    CATARACT EXTRACTION Right    EYE SURGERY Right    glucoma   FINGER SURGERY Right    ring finger   LIPOMA EXCISION     hip   RETINAL DETACHMENT SURGERY Left    TONSILLECTOMY     Social History   Occupational History   Occupation: Press photographer associate/retired    Comment: [employer]  Tobacco Use   Smoking status: Former    Packs/day: 0.25    Types: Cigarettes    Quit date: 11/23/2016    Years since quitting: 4.2   Smokeless tobacco: Never   Tobacco comments:    stopped by using nicorette gum. has completely stopped  Vaping Use   Vaping Use: Never used  Substance and Sexual Activity   Alcohol use: Yes    Alcohol/week: 0.0  standard drinks    Comment: occasionally   Drug use: No   Sexual activity: Not on file

## 2021-02-25 NOTE — Telephone Encounter (Signed)
Rescheduled for 1330.

## 2021-02-25 NOTE — Progress Notes (Signed)
Pt state back pain that travels to her left hip. Pt state laying down makes the pain worse. Pt state she takes over the counter pain meds and heating to help ease her pain.   Numeric Pain Rating Scale and Functional Assessment Average Pain 8 Pain Right Now 5 My pain is intermittent and aching Pain is worse with: some activites and laying down Pain improves with: heat/ice and medication   In the last MONTH (on 0-10 scale) has pain interfered with the following?  1. General activity like being  able to carry out your everyday physical activities such as walking, climbing stairs, carrying groceries, or moving a chair?  Rating(7)  2. Relation with others like being able to carry out your usual social activities and roles such as  activities at home, at work and in your community. Rating(8)  3. Enjoyment of life such that you have  been bothered by emotional problems such as feeling anxious, depressed or irritable?  Rating(9)

## 2021-03-12 ENCOUNTER — Other Ambulatory Visit: Payer: Self-pay

## 2021-03-12 ENCOUNTER — Ambulatory Visit (INDEPENDENT_AMBULATORY_CARE_PROVIDER_SITE_OTHER): Payer: Medicare Other | Admitting: Physical Medicine and Rehabilitation

## 2021-03-12 ENCOUNTER — Encounter: Payer: Self-pay | Admitting: Physical Medicine and Rehabilitation

## 2021-03-12 ENCOUNTER — Ambulatory Visit: Payer: Self-pay

## 2021-03-12 VITALS — BP 134/87 | HR 76

## 2021-03-12 DIAGNOSIS — M47816 Spondylosis without myelopathy or radiculopathy, lumbar region: Secondary | ICD-10-CM

## 2021-03-12 MED ORDER — METHYLPREDNISOLONE ACETATE 80 MG/ML IJ SUSP
80.0000 mg | Freq: Once | INTRAMUSCULAR | Status: AC
  Administered 2021-03-12: 80 mg

## 2021-03-12 NOTE — Progress Notes (Signed)
Pt state back pain that travels to her left hip. Pt state laying down makes the pain worse. Pt state she takes over the counter pain meds and heating to help ease her pain.  Numeric Pain Rating Scale and Functional Assessment Average Pain 5   In the last MONTH (on 0-10 scale) has pain interfered with the following?  1. General activity like being  able to carry out your everyday physical activities such as walking, climbing stairs, carrying groceries, or moving a chair?  Rating(8)   +Driver, -BT, -Dye Allergies.

## 2021-03-12 NOTE — Procedures (Signed)
Lumbar Facet Joint Intra-Articular Injection(s) with Fluoroscopic Guidance  [name]      Date of Birth: 11-27-1950 MRN: [id number] PCP: Midge Minium, MD      Visit Date: 03/12/2021   Universal Protocol:    Date/Time: 03/12/2021  Consent Given By: the patient  Position: PRONE   Additional Comments: Vital signs were monitored before and after the procedure. Patient was prepped and draped in the usual sterile fashion. The correct patient, procedure, and site was verified.   Injection Procedure Details:  Procedure Site One Meds Administered:  Meds ordered this encounter  Medications   methylPREDNISolone acetate (DEPO-MEDROL) injection 80 mg     Laterality: Left  Location/Site:  L4-L5 L5-S1  Needle size: 22 guage  Needle type: Spinal  Needle Placement: Articular  Findings:  -Comments: Excellent flow of contrast producing a partial arthrogram.  Procedure Details: The fluoroscope beam is vertically oriented in AP, and the inferior recess is visualized beneath the lower pole of the inferior apophyseal process, which represents the target point for needle insertion. When direct visualization is difficult the target point is located at the medial projection of the vertebral pedicle. The region overlying each aforementioned target is locally anesthetized with a 1 to 2 ml. volume of 1% Lidocaine without Epinephrine.   The spinal needle was inserted into each of the above mentioned facet joints using biplanar fluoroscopic guidance. A 0.25 to 0.5 ml. volume of Isovue-250 was injected and a partial facet joint arthrogram was obtained. A single spot film was obtained of the resulting arthrogram.    One to 1.25 ml of the steroid/anesthetic solution was then injected into each of the facet joints noted above.   Additional Comments:  The patient tolerated the procedure well Dressing: 2 x 2 sterile gauze and Band-Aid    Post-procedure details: Patient was  observed during the procedure. Post-procedure instructions were reviewed.  Patient left the clinic in stable condition.

## 2021-03-12 NOTE — Progress Notes (Signed)
Janet Pierce - 70 y.o. female MRN [id number]  Date of birth: 1951/04/19  Office Visit Note: Visit Date: 03/12/2021 PCP: Midge Minium, MD Referred by: Midge Minium, MD  Subjective: Chief Complaint  Patient presents with   Lower Back - Pain   Left Hip - Pain   HPI:  Janet Pierce is a 70 y.o. female who comes in today at the request of Barnet Pall, FNP for planned Left  L4-5 and L5-S1 Lumbar facet/medial branch block with fluoroscopic guidance.  The patient has failed conservative care including home exercise, medications, time and activity modification.  This injection will be diagnostic and hopefully therapeutic.  Please see requesting physician notes for further details and justification.  Exam has shown concordant pain with facet joint loading.   ROS Otherwise per HPI.  Assessment & Plan: Visit Diagnoses:    ICD-10-CM   1. Spondylosis without myelopathy or radiculopathy, lumbar region  M47.816 XR C-ARM NO REPORT    Facet Injection    methylPREDNISolone acetate (DEPO-MEDROL) injection 80 mg    2. Facet arthropathy, lumbar  M47.816 XR C-ARM NO REPORT    Facet Injection    methylPREDNISolone acetate (DEPO-MEDROL) injection 80 mg      Plan: No additional findings.   Meds & Orders:  Meds ordered this encounter  Medications   methylPREDNISolone acetate (DEPO-MEDROL) injection 80 mg    Orders Placed This Encounter  Procedures   Facet Injection   XR C-ARM NO REPORT    Follow-up: Return if symptoms worsen or fail to improve.   Procedures: No procedures performed  Lumbar Facet Joint Intra-Articular Injection(s) with Fluoroscopic Guidance  [name]      Date of Birth: 1951-02-09 MRN: [id number] PCP: Midge Minium, MD      Visit Date: 03/12/2021   Universal Protocol:    Date/Time: 03/12/2021  Consent Given By: the patient  Position: PRONE   Additional Comments: Vital signs were monitored before and after the procedure. Patient  was prepped and draped in the usual sterile fashion. The correct patient, procedure, and site was verified.   Injection Procedure Details:  Procedure Site One Meds Administered:  Meds ordered this encounter  Medications   methylPREDNISolone acetate (DEPO-MEDROL) injection 80 mg     Laterality: Left  Location/Site:  L4-L5 L5-S1  Needle size: 22 guage  Needle type: Spinal  Needle Placement: Articular  Findings:  -Comments: Excellent flow of contrast producing a partial arthrogram.  Procedure Details: The fluoroscope beam is vertically oriented in AP, and the inferior recess is visualized beneath the lower pole of the inferior apophyseal process, which represents the target point for needle insertion. When direct visualization is difficult the target point is located at the medial projection of the vertebral pedicle. The region overlying each aforementioned target is locally anesthetized with a 1 to 2 ml. volume of 1% Lidocaine without Epinephrine.   The spinal needle was inserted into each of the above mentioned facet joints using biplanar fluoroscopic guidance. A 0.25 to 0.5 ml. volume of Isovue-250 was injected and a partial facet joint arthrogram was obtained. A single spot film was obtained of the resulting arthrogram.    One to 1.25 ml of the steroid/anesthetic solution was then injected into each of the facet joints noted above.   Additional Comments:  The patient tolerated the procedure well Dressing: 2 x 2 sterile gauze and Band-Aid    Post-procedure details: Patient was observed during the procedure. Post-procedure instructions were reviewed.  Patient left  the clinic in stable condition.     Clinical History: MRI LUMBAR SPINE WITHOUT CONTRAST   TECHNIQUE: Multiplanar, multisequence MR imaging of the lumbar spine was performed. No intravenous contrast was administered.   COMPARISON:  Plain films lumbar spine from [facility] 11/02/2018.    FINDINGS: Segmentation:  Standard.   Alignment: Severe convex left scoliosis with the apex at T12-L1. Facet degenerative change results in 0.6 cm anterolisthesis L4 on L5 and trace anterolisthesis L5 on S1.   Vertebrae: No fracture or worrisome lesion. Degenerative endplate signal change at L4-5 is eccentric to the left.   Conus medullaris and cauda equina: Conus extends to the L2 level. Conus and cauda equina appear normal.   Paraspinal and other soft tissues: Negative.   Disc levels:   T12-L1: Right worse than left facet degenerative change. No disc bulge or protrusion. No stenosis.   L1-2: Bilateral facet degenerative disease appears worse on the right. Minimal disc bulge without stenosis.   L2-3: Facet arthropathy.  Otherwise negative.   L3-4: Mild-to-moderate facet degenerative change. Otherwise negative.   L4-5: Advanced bilateral facet degenerative disease is worse on the left. The disc is uncovered with a shallow bulge. There is mild central canal and left subarticular recess narrowing. The right foramen is open. Marked left foraminal narrowing is present. The exiting left L4 root appears compressed in the foramen   L5-S1: Slight disc uncovering. Bilateral facet degenerative change is worse on the left. The central canal and foramina are widely patent.   IMPRESSION: Given left leg symptoms, dominant finding is at L4-5 where anterolisthesis, disc and facet arthropathy cause marked left foraminal narrowing. The exiting left L4 root appears compressed in the foramen.   Negative for central canal stenosis.   Severe convex left thoracolumbar scoliosis.     Electronically Signed   By: Inge Rise M.D.   On: 12/14/2018 15:59     Objective:  VS:  HT:    WT:   BMI:     BP:134/87  HR:76bpm  TEMP: ( )  RESP:  Physical Exam Vitals and nursing note reviewed.  Constitutional:      General: She is not in acute distress.    Appearance: Normal  appearance. She is not ill-appearing.  HENT:     Head: Normocephalic and atraumatic.     Right Ear: External ear normal.     Left Ear: External ear normal.  Eyes:     Extraocular Movements: Extraocular movements intact.  Cardiovascular:     Rate and Rhythm: Normal rate.     Pulses: Normal pulses.  Pulmonary:     Effort: Pulmonary effort is normal. No respiratory distress.  Abdominal:     General: There is no distension.     Palpations: Abdomen is soft.  Musculoskeletal:        General: Tenderness present.     Cervical back: Neck supple.     Right lower leg: No edema.     Left lower leg: No edema.     Comments: Patient has good distal strength with no pain over the greater trochanters.  No clonus or focal weakness. Patient somewhat slow to rise from a seated position to full extension.  There is concordant low back pain with facet loading and lumbar spine extension rotation.  There are no definitive trigger points but the patient is somewhat tender across the lower back and PSIS.  There is no pain with hip rotation.   Skin:    Findings: No erythema, lesion  or rash.  Neurological:     General: No focal deficit present.     Mental Status: She is alert and oriented to person, place, and time.     Sensory: No sensory deficit.     Motor: No weakness or abnormal muscle tone.     Coordination: Coordination normal.  Psychiatric:        Mood and Affect: Mood normal.        Behavior: Behavior normal.     Imaging: No results found.

## 2021-03-12 NOTE — Patient Instructions (Signed)

## 2021-04-04 ENCOUNTER — Encounter: Payer: Self-pay | Admitting: Family Medicine

## 2021-04-04 ENCOUNTER — Ambulatory Visit (INDEPENDENT_AMBULATORY_CARE_PROVIDER_SITE_OTHER): Payer: Medicare Other | Admitting: Family Medicine

## 2021-04-04 ENCOUNTER — Other Ambulatory Visit: Payer: Self-pay

## 2021-04-04 VITALS — BP 118/72 | HR 87 | Temp 97.7°F | Resp 16 | Ht 63.0 in | Wt 146.0 lb

## 2021-04-04 DIAGNOSIS — E559 Vitamin D deficiency, unspecified: Secondary | ICD-10-CM

## 2021-04-04 DIAGNOSIS — Z0000 Encounter for general adult medical examination without abnormal findings: Secondary | ICD-10-CM

## 2021-04-04 DIAGNOSIS — I10 Essential (primary) hypertension: Secondary | ICD-10-CM | POA: Diagnosis not present

## 2021-04-04 LAB — HEPATIC FUNCTION PANEL
ALT: 20 U/L (ref 0–35)
AST: 17 U/L (ref 0–37)
Albumin: 4.1 g/dL (ref 3.5–5.2)
Alkaline Phosphatase: 59 U/L (ref 39–117)
Bilirubin, Direct: 0.1 mg/dL (ref 0.0–0.3)
Total Bilirubin: 0.6 mg/dL (ref 0.2–1.2)
Total Protein: 6.9 g/dL (ref 6.0–8.3)

## 2021-04-04 LAB — CBC WITH DIFFERENTIAL/PLATELET
Basophils Absolute: 0.1 10*3/uL (ref 0.0–0.1)
Basophils Relative: 1.3 % (ref 0.0–3.0)
Eosinophils Absolute: 0.2 10*3/uL (ref 0.0–0.7)
Eosinophils Relative: 3.9 % (ref 0.0–5.0)
HCT: 36.3 % (ref 36.0–46.0)
Hemoglobin: 11.9 g/dL — ABNORMAL LOW (ref 12.0–15.0)
Lymphocytes Relative: 36.4 % (ref 12.0–46.0)
Lymphs Abs: 1.4 10*3/uL (ref 0.7–4.0)
MCHC: 32.9 g/dL (ref 30.0–36.0)
MCV: 84.9 fl (ref 78.0–100.0)
Monocytes Absolute: 0.4 10*3/uL (ref 0.1–1.0)
Monocytes Relative: 9 % (ref 3.0–12.0)
Neutro Abs: 2 10*3/uL (ref 1.4–7.7)
Neutrophils Relative %: 49.4 % (ref 43.0–77.0)
Platelets: 280 10*3/uL (ref 150.0–400.0)
RBC: 4.27 Mil/uL (ref 3.87–5.11)
RDW: 14.3 % (ref 11.5–15.5)
WBC: 4 10*3/uL (ref 4.0–10.5)

## 2021-04-04 LAB — LIPID PANEL
Cholesterol: 208 mg/dL — ABNORMAL HIGH (ref 0–200)
HDL: 60.5 mg/dL (ref >39.00)
LDL Cholesterol: 135 mg/dL — ABNORMAL HIGH (ref 0–99)
NonHDL: 147.96
Total CHOL/HDL Ratio: 3
Triglycerides: 63 mg/dL (ref 0.0–149.0)
VLDL: 12.6 mg/dL (ref 0.0–40.0)

## 2021-04-04 LAB — BASIC METABOLIC PANEL
BUN: 12 mg/dL (ref 6–23)
CO2: 31 mEq/L (ref 19–32)
Calcium: 9.5 mg/dL (ref 8.4–10.5)
Chloride: 103 mEq/L (ref 96–112)
Creatinine, Ser: 0.69 mg/dL (ref 0.40–1.20)
GFR: 88.17 mL/min (ref >60.00)
Glucose, Bld: 92 mg/dL (ref 70–99)
Potassium: 3.9 mEq/L (ref 3.5–5.1)
Sodium: 140 mEq/L (ref 135–145)

## 2021-04-04 LAB — TSH: TSH: 2.53 u[IU]/mL (ref 0.35–5.50)

## 2021-04-04 LAB — VITAMIN D 25 HYDROXY (VIT D DEFICIENCY, FRACTURES): VITD: 26.7 ng/mL — ABNORMAL LOW (ref 30.00–100.00)

## 2021-04-04 MED ORDER — VITAMIN D (ERGOCALCIFEROL) 1.25 MG (50000 UNIT) PO CAPS
50000.0000 [IU] | ORAL_CAPSULE | ORAL | 0 refills | Status: DC
Start: 2021-04-04 — End: 2021-04-12

## 2021-04-04 NOTE — Assessment & Plan Note (Signed)
Chronic problem.  Well controlled today.  Currently asymptomatic.  Check labs.  No anticipated med changes.

## 2021-04-04 NOTE — Patient Instructions (Signed)
Follow up in 6 months to recheck BP and cholesterol We'll notify you of your lab resutls and make any changes if needed Continue to work on healthy diet and regular exercise- you look great! Call with any questions or concerns Stay Safe!  Stay Healthy! Happy Holidays!!!

## 2021-04-04 NOTE — Assessment & Plan Note (Signed)
Pt's PE WNL.  UTD on mammo, colonoscopy, DEXA.  Declines flu and PNA vaccines.  Check labs.  Anticipatory guidance provided.

## 2021-04-04 NOTE — Assessment & Plan Note (Signed)
Pt has history of this.  Check labs and replete prn.

## 2021-04-04 NOTE — Progress Notes (Signed)
° °  Subjective:    Patient ID: Janet Pierce, female    DOB: [birth year], 70 y.o.   MRN: [id number]  HPI CPE- HTD on colonoscopy, mammo, DEXA.  Declines flu and PNA vaccines.  Patient Care Team    Relationship Specialty Notifications Start End  Midge Minium, MD PCP - General   04/03/10   , [facility]  04/16/17   Magnus Sinning, MD Consulting Physician Physical Medicine and Rehabilitation  04/27/19   Leandrew Koyanagi, MD Consulting Physician Orthopedic Surgery  04/27/19   Corey Harold, MD Consulting Physician Ophthalmology  04/27/19     Health Maintenance  Topic Date Due   Pneumonia Vaccine 15+ Years old (1 - PCV) Never done   TETANUS/TDAP  04/20/2021 (Originally 03/23/1970)   COVID-19 Vaccine (3 - Moderna risk series) 04/20/2021 (Originally 04/09/2020)   Zoster Vaccines- Shingrix (1 of 2) 04/20/2021 (Originally 03/23/1970)   INFLUENZA VACCINE  07/19/2021 (Originally 11/19/2020)   Hepatitis C Screening  10/31/2021 (Originally 03/23/1969)   Fecal DNA (Cologuard)  10/31/2021 (Originally 03/23/2001)   MAMMOGRAM  01/24/2023   DEXA SCAN  Completed   HPV VACCINES  Aged Out      Review of Systems Patient reports no vision/ hearing changes, adenopathy,fever, weight change,  persistant/recurrent hoarseness , swallowing issues, chest pain, palpitations, edema, persistant/recurrent cough, hemoptysis, dyspnea (rest/exertional/paroxysmal nocturnal), gastrointestinal bleeding (melena, rectal bleeding), abdominal pain, significant heartburn, bowel changes, GU symptoms (dysuria, hematuria, incontinence), Gyn symptoms (abnormal  bleeding, pain),  syncope, focal weakness, memory loss, numbness & tingling, skin/hair/nail changes, abnormal bruising or bleeding, anxiety, or depression.   This visit occurred during the SARS-CoV-2 public health emergency.  Safety protocols were in place, including screening questions prior to the visit, additional usage of staff PPE, and extensive cleaning  of exam room while observing appropriate contact time as indicated for disinfecting solutions.      Objective:   Physical Exam General Appearance:    Alert, cooperative, no distress, appears stated age  Head:    Normocephalic, without obvious abnormality, atraumatic  Eyes:    PERRL, conjunctiva/corneas clear, EOM's intact, fundi    benign, both eyes  Ears:    Normal TM's and external ear canals, both ears  Nose:   Deferred due to COVID  Throat:   Neck:   Supple, symmetrical, trachea midline, no adenopathy;    Thyroid: no enlargement/tenderness/nodules  Back:     Symmetric, no curvature, ROM normal, no CVA tenderness  Lungs:     Clear to auscultation bilaterally, respirations unlabored  Chest Wall:    No tenderness or deformity   Heart:    Regular rate and rhythm, S1 and S2 normal, no murmur, rub   or gallop  Breast Exam:    Deferred to mammo  Abdomen:     Soft, non-tender, bowel sounds active all four quadrants,    no masses, no organomegaly  Genitalia:    Deferred  Rectal:    Extremities:   Extremities normal, atraumatic, no cyanosis or edema  Pulses:   2+ and symmetric all extremities  Skin:   Skin color, texture, turgor normal, no rashes or lesions  Lymph nodes:   Cervical, supraclavicular, and axillary nodes normal  Neurologic:   CNII-XII intact, normal strength, sensation and reflexes    throughout          Assessment & Plan:

## 2021-04-05 ENCOUNTER — Telehealth: Payer: Self-pay

## 2021-04-05 NOTE — Telephone Encounter (Signed)
-----   Message from Midge Minium, MD sent at 04/04/2021  2:01 PM EST ----- Your Vit D level is low.  Based on this, we need to start prescription 50,000 units weekly x12 weeks in addition to daily OTC supplement of at least 2000 units.   Your total cholesterol and LDL (bad cholesterol) are both higher than last year but the ratio of good to bad is still excellent.  No need for med changes at this time  Remainder of labs look great!

## 2021-04-05 NOTE — Telephone Encounter (Signed)
Lvm for patient informing her of her labs and meds

## 2021-04-08 ENCOUNTER — Telehealth: Payer: Self-pay

## 2021-04-08 NOTE — Telephone Encounter (Signed)
Please advise if it's ok to restart and send in sucralfate

## 2021-04-08 NOTE — Telephone Encounter (Signed)
Caller name:Janet Pierce   On DPR? :Yes  Call back number:[phone]  Provider they see: Birdie Riddle   Reason for call:Pt is calling wanting to know if she can be put back on Sucralfate  (Carafate)1g If this can be filled she wants sent to [facility]

## 2021-04-09 ENCOUNTER — Ambulatory Visit: Payer: Medicare Other | Admitting: Physical Therapy

## 2021-04-09 MED ORDER — SUCRALFATE 1 G PO TABS: 1.0000 g | ORAL_TABLET | Freq: Three times a day (TID) | ORAL | 0 refills | Status: DC

## 2021-04-09 NOTE — Telephone Encounter (Signed)
I can send in the Carafate but if she is having worsening problems w/ her reflux, she needs to schedule a follow up w/ Dr Havery Moros (GI)

## 2021-04-09 NOTE — Telephone Encounter (Signed)
Patient aware.

## 2021-04-10 ENCOUNTER — Encounter: Payer: Self-pay | Admitting: Physical Therapy

## 2021-04-10 ENCOUNTER — Ambulatory Visit: Payer: Medicare Other | Attending: Physical Medicine and Rehabilitation | Admitting: Physical Therapy

## 2021-04-10 ENCOUNTER — Other Ambulatory Visit: Payer: Self-pay

## 2021-04-10 DIAGNOSIS — M47816 Spondylosis without myelopathy or radiculopathy, lumbar region: Secondary | ICD-10-CM | POA: Insufficient documentation

## 2021-04-10 DIAGNOSIS — M25552 Pain in left hip: Secondary | ICD-10-CM | POA: Insufficient documentation

## 2021-04-10 DIAGNOSIS — M542 Cervicalgia: Secondary | ICD-10-CM | POA: Insufficient documentation

## 2021-04-10 DIAGNOSIS — R262 Difficulty in walking, not elsewhere classified: Secondary | ICD-10-CM | POA: Insufficient documentation

## 2021-04-10 DIAGNOSIS — M6281 Muscle weakness (generalized): Secondary | ICD-10-CM | POA: Insufficient documentation

## 2021-04-10 DIAGNOSIS — R252 Cramp and spasm: Secondary | ICD-10-CM | POA: Insufficient documentation

## 2021-04-10 NOTE — Therapy (Signed)
[place]. [place], [place], [address] Phone: [phone]   Fax:  [phone]  Physical Therapy Treatment  Patient Details  Name: Alleyne Lac MRN: [id number] Date of Birth: [birth year] Referring Provider (PT): Barnet Pall   Encounter Date: 04/10/2021   PT End of Session - 04/10/21 1053     Visit Number 1    Number of Visits 16    Date for PT Re-Evaluation 06/05/21    PT Start Time 1018    PT Stop Time 1058    PT Time Calculation (min) 40 min    Activity Tolerance Patient tolerated treatment well;Patient limited by pain    Behavior During Therapy [facility] for tasks assessed/performed             Past Medical History:  Diagnosis Date   Allergy    Anemia    Anxiety    Arthritis    Cataract    GERD (gastroesophageal reflux disease)    Glaucoma    Hyperlipidemia    Hypertension    Osteoporosis    Retinal detachment    left eye prosthesis   Scoliosis     Past Surgical History:  Procedure Laterality Date   BUNIONECTOMY Left    CATARACT EXTRACTION Right    EYE SURGERY Right    glucoma   FINGER SURGERY Right    ring finger   LIPOMA EXCISION     hip   RETINAL DETACHMENT SURGERY Left    TONSILLECTOMY      There were no vitals filed for this visit.   Subjective Assessment - 04/10/21 1021     Subjective Patient reports LBP that had responded to injections in the past. MRI 2020 showed advanced bilateral facet degenerative changes that are worse on the left at L4-L5 and bilateral facet degenerative changes that are worse on the left at L5-S1. No high grade spinal canal stenosis noted. She had injections again on 03/12/21, but has not gotten relief this time    Pertinent History scoliosis, HTN, arthritis, hyperlipedemia    Limitations Lifting;House hold activities    How long can you sit comfortably? As long as she has appropriate back support she is fine.    How long can you stand comfortably? N/A     How long can you walk comfortably? N/A, but she has not attempted to walk for fitness recently, so limited distances.    Diagnostic tests advanced bilateral facet degenerative changes that are worse on the left at L4-L5 and bilateral facet degenerative changes that are worse on the left at L5-S1. No high grade spinal canal stenosis noted..    Patient Stated Goals Improve her flexibility and strength to decrease pain.    Currently in Pain? Yes    Pain Score 7     Pain Location Back    Pain Orientation Left;Lower    Pain Descriptors / Indicators Burning;Sharp    Pain Type Chronic pain    Pain Radiating Towards Stops at the top of buttocks    Pain Onset More than a month ago    Pain Frequency Intermittent    Aggravating Factors  bending, sitting without back support, twisting    Pain Relieving Factors Ephraim Hamburger, heating pad    Effect of Pain on Daily Activities she is avoiding certain movements                [facility] PT Assessment - 04/10/21 0001       Assessment  Medical Diagnosis lumbar pain    Referring Provider (PT) Barnet Pall    Hand Dominance Right      Precautions   Precautions None      Balance Screen   Has the patient fallen in the past 6 months No    Has the patient had a decrease in activity level because of a fear of falling?  No      Home Environment   Additional Comments some housework/yardwork, has stairs reports no trouble with stairs      Prior Function   Level of Independence Independent    Leisure gardening, bowling, walking, traveling,      Posture/Postural Control   Posture/Postural Control Postural limitations    Postural Limitations Rounded Shoulders;Forward head    Posture Comments thoraco lumbar scoliosis with convexity to L, R pelvis higher than L.      ROM / Strength   AROM / PROM / Strength AROM;Strength      AROM   Overall AROM Comments BUE WFL    AROM Assessment Site Hip    Right/Left Hip Right;Left    Right Hip Extension 0     Right Hip Flexion 110    Right Hip External Rotation  25    Right Hip Internal Rotation  25    Right Hip ABduction 40    Left Hip Extension 0    Left Hip Flexion 90    Left Hip External Rotation  20    Left Hip Internal Rotation  20    Left Hip ABduction 35      Strength   Overall Strength Comments BUE WNL    Strength Assessment Site Hip;Knee;Ankle    Right/Left Hip Right;Left    Right Hip Flexion 4-/5    Right Hip Extension 3+/5    Right Hip External Rotation  4-/5    Right Hip Internal Rotation 4-/5    Right Hip ABduction 4-/5    Left Hip Flexion 3+/5    Left Hip Extension 3-/5    Left Hip External Rotation 3/5    Left Hip Internal Rotation 3/5    Left Hip ABduction 3/5    Right/Left Knee Right;Left    Right Knee Flexion 4-/5    Right Knee Extension 4-/5    Left Knee Flexion 3+/5    Left Knee Extension 3+/5    Right/Left Ankle Right;Left    Right Ankle Dorsiflexion 4-/5    Right Ankle Plantar Flexion 3+/5    Left Ankle Dorsiflexion 4-/5    Left Ankle Plantar Flexion 3+/5      Palpation   Spinal mobility Limited lumbar mobiity by 25% in all directions, all planes                                    PT Education - 04/10/21 1053     Education Details POC to addres [place] with strength, stretching, modalities.    Person(s) Educated Patient    Methods Explanation    Comprehension Verbalized understanding              PT Short Term Goals - 04/10/21 1407       PT SHORT TERM GOAL #1   Title Pt will be independent with initial HEP    Time 4    Period Weeks    Status New    Target Date 05/08/21  PT Long Term Goals - 04/10/21 1408       PT LONG TERM GOAL #1   Title Pt will be I with advanced HEP    Time 6    Period Weeks    Status New    Target Date 05/22/21      PT LONG TERM GOAL #2   Title Patient will demonstrate B hip and lumbar ROM to WNL with pain no> 3/10    Baseline Painful in all planes.    Time 6     Period Weeks    Status New    Target Date 05/22/21      PT LONG TERM GOAL #3   Title patient will demosntrate BLE strength of at least 4/5 throughout.    Baseline (3-)-4/5    Time 6    Period Weeks    Status New    Target Date 05/22/21      PT LONG TERM GOAL #4   Title Patient will perform all of her daily activities including housework, bowling, caring for her pet, without increase of pain > 3/10    Time 6    Period Weeks    Status New    Target Date 05/22/21      PT LONG TERM GOAL #5   Title Increase FOTO score to at least 62%    Baseline 59%    Time 6    Period Weeks    Status New    Target Date 05/22/21                   Plan - 04/10/21 1055     Clinical Impression Statement Patient reports chronic back pain that has exacerbated recently and has not calmed down. She scored 59% on the FOTO, risk adjusted score 44%. She demosntrates poor posture, decreased strength, decreased ROM, decreased safety and I with all functional mobility. She will benefit from PT to facilitate improved strength and ROm, decrease pain, to allow her to return to PLOF without pain.    Personal Factors and Comorbidities Comorbidity 2    Comorbidities HTN, arthritis    Examination-Participation Restrictions Yard Work;Cleaning    Stability/Clinical Decision Making Stable/Uncomplicated    Clinical Decision Making Low    Rehab Potential Good    PT Frequency Other (comment)   1-2x/week   PT Duration 6 weeks    PT Treatment/Interventions ADLs/Self Care Home Management;Electrical Stimulation;Iontophoresis 4mg /ml Dexamethasone;Moist Heat;Therapeutic exercise;Therapeutic activities;Patient/family education;Manual techniques;Dry needling;Passive range of motion;Taping;Balance training;Cryotherapy;Gait training;Stair training;Functional mobility training    PT Next Visit Plan HEP, identify any potentil treatment for pain, strengthening and ROM.    Consulted and Agree with Plan of Care Patient              Patient will benefit from skilled therapeutic intervention in order to improve the following deficits and impairments:  Abnormal gait, Decreased range of motion, Difficulty walking, Increased muscle spasms, Decreased strength, Decreased mobility, Decreased balance, Decreased activity tolerance, Pain  Visit Diagnosis: Muscle weakness (generalized)  Cramp and spasm  Difficulty in walking, not elsewhere classified  Pain in left hip     Problem List Patient Active Problem List   Diagnosis Date Noted   Osteoporosis 09/28/2019   Vitamin D deficiency 07/08/2018   Benign paroxysmal positional vertigo 05/30/2015   Bunion, right foot 06/01/2014   Tobacco abuse 11/25/2013   Dysphagia 05/19/2013   Special screening for malignant neoplasms, colon 05/19/2013   GERD (gastroesophageal reflux disease) 04/04/2013   Allergic rhinitis 04/04/2013  General medical examination 09/11/2010   PREMATURE VENTRICULAR CONTRACTIONS 11/06/2009   SUPRAVENTRICULAR TACHYCARDIA 10/05/2009   LEUKOCYTOPENIA UNSPECIFIED 09/10/2009   DEPRESSIVE DISORDER 09/06/2009   ECZEMA 09/06/2009   PALPITATIONS, OCCASIONAL 06/08/2009   SHOULDER PAIN, LEFT 05/24/2009   BACK PAIN 05/24/2009   SCOLIOSIS 05/24/2009   LIPOMA 02/02/2008   Hyperlipidemia 02/02/2008   HYPERTENSION, BENIGN ESSENTIAL 02/02/2008    Marcelina Morel, DPT 04/10/2021, 2:21 PM  [place]. [place], [place], [address] Phone: [phone]   Fax:  [phone]  Name: Samyukta Cura MRN: [id number] Date of Birth: 01-31-1951

## 2021-04-12 ENCOUNTER — Ambulatory Visit: Payer: Medicare Other | Admitting: Family Medicine

## 2021-04-12 ENCOUNTER — Encounter: Payer: Self-pay | Admitting: Family Medicine

## 2021-04-12 VITALS — BP 122/60 | HR 91 | Temp 97.9°F | Resp 16 | Wt 145.6 lb

## 2021-04-12 DIAGNOSIS — R0982 Postnasal drip: Secondary | ICD-10-CM | POA: Diagnosis not present

## 2021-04-12 MED ORDER — GUAIFENESIN ER 600 MG PO TB12: 600.0000 mg | ORAL_TABLET | Freq: Two times a day (BID) | ORAL | 1 refills | Status: DC

## 2021-04-12 MED ORDER — VITAMIN D (ERGOCALCIFEROL) 1.25 MG (50000 UNIT) PO CAPS: 50000.0000 [IU] | ORAL_CAPSULE | ORAL | 0 refills | Status: DC

## 2021-04-12 MED ORDER — CETIRIZINE HCL 10 MG PO TABS: 10.0000 mg | ORAL_TABLET | Freq: Every day | ORAL | 11 refills | Status: DC

## 2021-04-12 NOTE — Patient Instructions (Signed)
Follow up as needed or as scheduled START daily Cetirizine (Zyrtec) ADD Mucinex to help thin the congestion Drink LOTS of fluids Call with any questions or concerns Stay Safe!  Stay Healthy! Happy Holidays!!!

## 2021-04-12 NOTE — Progress Notes (Signed)
° °  Subjective:    Patient ID: Janet Pierce, female    DOB: December 30, 1950, 69 y.o.   MRN: [id number]  HPI Film on tongue/back of throat- 'it feels like phlegm'  doesn't feel the need to cough.  'it feels like something's stuck back there'.  Is concerned for thrush on her tongue.  No trouble swallowing.  Doesn't feel anything in chest.  No change in taste.  Sxs started ~2 weeks ago.  Not currently taking any allergy medications.   Review of Systems For ROS see HPI   This visit occurred during the SARS-CoV-2 public health emergency.  Safety protocols were in place, including screening questions prior to the visit, additional usage of staff PPE, and extensive cleaning of exam room while observing appropriate contact time as indicated for disinfecting solutions.      Objective:   Physical Exam Vitals reviewed.  Constitutional:      General: She is not in acute distress.    Appearance: Normal appearance. She is well-developed. She is not ill-appearing.  HENT:     Head: Normocephalic and atraumatic.     Right Ear: Tympanic membrane normal.     Left Ear: Tympanic membrane normal.     Nose: Mucosal edema and rhinorrhea present.     Right Sinus: No maxillary sinus tenderness or frontal sinus tenderness.     Left Sinus: No maxillary sinus tenderness or frontal sinus tenderness.     Mouth/Throat:     Pharynx: Posterior oropharyngeal erythema (w/ PND) present.  Eyes:     Conjunctiva/sclera: Conjunctivae normal.     Pupils: Pupils are equal, round, and reactive to light.  Cardiovascular:     Rate and Rhythm: Normal rate and regular rhythm.     Heart sounds: Normal heart sounds.  Pulmonary:     Effort: Pulmonary effort is normal. No respiratory distress.     Breath sounds: Normal breath sounds. No wheezing or rales.  Musculoskeletal:     Cervical back: Normal range of motion and neck supple.  Lymphadenopathy:     Cervical: No cervical adenopathy.  Skin:    General: Skin is warm and dry.   Neurological:     Mental Status: She is alert.          Assessment & Plan:   PND- new.  Pt's sxs are due to untreated PND.  Encouraged her to restart daily antihistamine.  Add Mucinex.  Pt expressed understanding and is in agreement w/ plan.

## 2021-04-16 ENCOUNTER — Other Ambulatory Visit: Payer: Self-pay

## 2021-04-16 ENCOUNTER — Encounter: Payer: Self-pay | Admitting: Physical Therapy

## 2021-04-16 ENCOUNTER — Ambulatory Visit: Payer: Medicare Other | Admitting: Physical Therapy

## 2021-04-16 DIAGNOSIS — R262 Difficulty in walking, not elsewhere classified: Secondary | ICD-10-CM

## 2021-04-16 DIAGNOSIS — M25552 Pain in left hip: Secondary | ICD-10-CM

## 2021-04-16 DIAGNOSIS — M6281 Muscle weakness (generalized): Secondary | ICD-10-CM | POA: Diagnosis not present

## 2021-04-16 DIAGNOSIS — M542 Cervicalgia: Secondary | ICD-10-CM

## 2021-04-16 DIAGNOSIS — R252 Cramp and spasm: Secondary | ICD-10-CM

## 2021-04-16 NOTE — Patient Instructions (Signed)
Access Code: [email or web address] URL: [email or web address] Date: 04/16/2021 Prepared by: Ethel Rana  Exercises Supine Single Knee to Chest Stretch - 1 x daily - 7 x weekly - 3 sets - 10-15 sec hold Supine Double Knee to Chest - 1 x daily - 7 x weekly - 3 sets - 10-15 sec hold Supine Active Knee Extension with Hand Support and Leg Straight - 1 x daily - 7 x weekly - 3 sets - 10-15 sec hold Supine Piriformis Stretch with Leg Straight - 1 x daily - 7 x weekly - 3 sets - 10 reps - 10-15 sec hold Supine Figure 4 Piriformis Stretch - 1 x daily - 7 x weekly - 3 sets - 10 reps - 10-15 sec hold Supine Bridge with Resistance Band - 1 x daily - 7 x weekly - 2 sets - 10 reps Supine Hip Abduction - 1 x daily - 7 x weekly - 2 sets - 10 reps

## 2021-04-16 NOTE — Therapy (Signed)
Janet Pierce. [place], [place], [address] Phone: [phone]   Fax:  [phone]  Physical Therapy Treatment  Patient Details  Name: Janet Pierce MRN: [id number] Date of Birth: 1950-10-24 Referring Provider (PT): Barnet Pall   Encounter Date: 04/16/2021   PT End of Session - 04/16/21 1559     Visit Number 2    Number of Visits 16    Date for PT Re-Evaluation 06/05/21    PT Start Time 9563    PT Stop Time 8756    PT Time Calculation (min) 40 min    Activity Tolerance Patient tolerated treatment well;Patient limited by pain    Behavior During Therapy [facility] for tasks assessed/performed             Past Medical History:  Diagnosis Date   Allergy    Anemia    Anxiety    Arthritis    Cataract    GERD (gastroesophageal reflux disease)    Glaucoma    Hyperlipidemia    Hypertension    Osteoporosis    Retinal detachment    left eye prosthesis   Scoliosis     Past Surgical History:  Procedure Laterality Date   BUNIONECTOMY Left    CATARACT EXTRACTION Right    EYE SURGERY Right    glucoma   FINGER SURGERY Right    ring finger   LIPOMA EXCISION     hip   RETINAL DETACHMENT SURGERY Left    TONSILLECTOMY      There were no vitals filed for this visit.   Subjective Assessment - 04/16/21 1402     Subjective Patient reports her back has felt better, but she had to clean the carpet today and her back is more sore after that.    Pertinent History scoliosis, HTN, arthritis, hyperlipedemia    Limitations Lifting;House hold activities    How long can you sit comfortably? As long as she has appropriate back support she is fine.    How long can you stand comfortably? N/A    How long can you walk comfortably? N/A, but she has not attempted to walk for fitness recently, so limited distances.    Diagnostic tests advanced bilateral facet degenerative changes that are worse on the left at L4-L5 and bilateral  facet degenerative changes that are worse on the left at L5-S1. No high grade spinal canal stenosis noted..    Patient Stated Goals Improve her flexibility and strength to decrease pain.    Pain Onset More than a month ago                               [name] - 04/16/21 0001       Exercises   Exercises Lumbar      Lumbar Exercises: Stretches   Active Hamstring Stretch Right;Left;3 reps;10 seconds    Single Knee to Chest Stretch Right;Left;3 reps;10 seconds    Double Knee to Chest Stretch 3 reps;10 seconds    Piriformis Stretch Right;Left;3 reps;10 seconds    Figure 4 Stretch 3 reps;10 seconds      Lumbar Exercises: Aerobic   Nustep L4 x 6 minutes      Lumbar Exercises: Supine   Bridge Non-compliant;10 reps;1 second    Bridge Limitations Hip abd with G Tband    Other Supine Lumbar Exercises Supine hip abd against G Tband resistance above knees.  Modalities   Modalities Electrical Stimulation;Moist Heat      Moist Heat Therapy   Number Minutes Moist Heat 15 Minutes    Moist Heat Location Lumbar Spine      Electrical Stimulation   Electrical Stimulation Location Lumbar spine    Electrical Stimulation Action IFC    Electrical Stimulation Parameters Patient tolerance    Electrical Stimulation Goals Pain                     PT Education - 04/16/21 1558     Education Details HEP    Person(s) Educated Patient    Methods Explanation;Demonstration;Handout    Comprehension Verbalized understanding;Returned demonstration              PT Short Term Goals - 04/16/21 1602       PT SHORT TERM GOAL #1   Title Pt will be independent with initial HEP    Baseline Initiated.    Time 3    Period Weeks    Status On-going    Target Date 05/08/21               PT Long Term Goals - 04/10/21 1408       PT LONG TERM GOAL #1   Title Pt will be I with advanced HEP    Time 6    Period Weeks    Status New     Target Date 05/22/21      PT LONG TERM GOAL #2   Title Patient will demonstrate B hip and lumbar ROM to WNL with pain no> 3/10    Baseline Painful in all planes.    Time 6    Period Weeks    Status New    Target Date 05/22/21      PT LONG TERM GOAL #3   Title patient will demosntrate BLE strength of at least 4/5 throughout.    Baseline (3-)-4/5    Time 6    Period Weeks    Status New    Target Date 05/22/21      PT LONG TERM GOAL #4   Title Patient will perform all of her daily activities including housework, bowling, caring for her pet, without increase of pain > 3/10    Time 6    Period Weeks    Status New    Target Date 05/22/21      PT LONG TERM GOAL #5   Title Increase FOTO score to at least 62%    Baseline 59%    Time 6    Period Weeks    Status New    Target Date 05/22/21                   Plan - 04/16/21 1559     Clinical Impression Statement Patient arrives reporting continued pain after having to clean and vacuum her carpet today. Initiated stretching for low back and hips as well as strengthening, initiated HEP. Patient tolerated activity well. Finished with MH and E stim for pain management.    Personal Factors and Comorbidities Comorbidity 2    Comorbidities HTN, arthritis    Examination-Participation Restrictions Yard Work;Cleaning    Stability/Clinical Decision Making Stable/Uncomplicated    Clinical Decision Making Low    Rehab Potential Good    PT Frequency Other (comment)   1-2x/week   PT Duration 6 weeks    PT Treatment/Interventions ADLs/Self Care Home Management;Electrical Stimulation;Iontophoresis 4mg /ml Dexamethasone;Moist Heat;Therapeutic exercise;Therapeutic activities;Patient/family education;Manual techniques;Dry needling;Passive range  of motion;Taping;Balance training;Cryotherapy;Gait training;Stair training;Functional mobility training    PT Next Visit Plan HEP, identify any potentil treatment for pain, strengthening and ROM.     PT Home Exercise Plan [email or web address]    Consulted and Agree with Plan of Care Patient             Patient will benefit from skilled therapeutic intervention in order to improve the following deficits and impairments:  Abnormal gait, Decreased range of motion, Difficulty walking, Increased muscle spasms, Decreased strength, Decreased mobility, Decreased balance, Decreased activity tolerance, Pain  Visit Diagnosis: Muscle weakness (generalized)  Cramp and spasm  Difficulty in walking, not elsewhere classified  Pain in left hip  Cervicalgia     Problem List Patient Active Problem List   Diagnosis Date Noted   Osteoporosis 09/28/2019   Vitamin D deficiency 07/08/2018   Benign paroxysmal positional vertigo 05/30/2015   Bunion, right foot 06/01/2014   Tobacco abuse 11/25/2013   Dysphagia 05/19/2013   Special screening for malignant neoplasms, colon 05/19/2013   GERD (gastroesophageal reflux disease) 04/04/2013   Allergic rhinitis 04/04/2013   General medical examination 09/11/2010   PREMATURE VENTRICULAR CONTRACTIONS 11/06/2009   SUPRAVENTRICULAR TACHYCARDIA 10/05/2009   LEUKOCYTOPENIA UNSPECIFIED 09/10/2009   DEPRESSIVE DISORDER 09/06/2009   ECZEMA 09/06/2009   PALPITATIONS, OCCASIONAL 06/08/2009   SHOULDER PAIN, LEFT 05/24/2009   BACK PAIN 05/24/2009   SCOLIOSIS 05/24/2009   LIPOMA 02/02/2008   Hyperlipidemia 02/02/2008   HYPERTENSION, BENIGN ESSENTIAL 02/02/2008    Marcelina Morel, DPT 04/16/2021, 4:03 PM  [place]. [place], [place], [address] Phone: [phone]   Fax:  [phone]  Name: Janet Pierce MRN: [id number] Date of Birth: [birth year]

## 2021-04-18 ENCOUNTER — Ambulatory Visit: Payer: Medicare Other | Admitting: Physical Therapy

## 2021-04-23 ENCOUNTER — Encounter: Payer: Self-pay | Admitting: Physical Therapy

## 2021-04-23 ENCOUNTER — Other Ambulatory Visit: Payer: Self-pay

## 2021-04-23 ENCOUNTER — Ambulatory Visit: Payer: Medicare Other | Attending: Physical Medicine and Rehabilitation | Admitting: Physical Therapy

## 2021-04-23 DIAGNOSIS — M6281 Muscle weakness (generalized): Secondary | ICD-10-CM | POA: Insufficient documentation

## 2021-04-23 DIAGNOSIS — R252 Cramp and spasm: Secondary | ICD-10-CM | POA: Diagnosis present

## 2021-04-23 DIAGNOSIS — M25552 Pain in left hip: Secondary | ICD-10-CM | POA: Diagnosis present

## 2021-04-23 DIAGNOSIS — M542 Cervicalgia: Secondary | ICD-10-CM | POA: Insufficient documentation

## 2021-04-23 DIAGNOSIS — R262 Difficulty in walking, not elsewhere classified: Secondary | ICD-10-CM | POA: Insufficient documentation

## 2021-04-23 NOTE — Patient Instructions (Signed)
Access Code: [email or web address] URL: [email or web address] Date: 04/23/2021 Prepared by: Ethel Rana  Exercises Supine Single Knee to Chest Stretch - 1 x daily - 7 x weekly - 3 sets - 10-15 sec hold Supine Double Knee to Chest - 1 x daily - 7 x weekly - 3 sets - 10-15 sec hold Supine Active Knee Extension with Hand Support and Leg Straight - 1 x daily - 7 x weekly - 3 sets - 10-15 sec hold Supine Piriformis Stretch with Leg Straight - 1 x daily - 7 x weekly - 3 sets - 10 reps - 10-15 sec hold Supine Figure 4 Piriformis Stretch - 1 x daily - 7 x weekly - 3 sets - 10 reps - 10-15 sec hold Supine Bridge with Resistance Band - 1 x daily - 7 x weekly - 2 sets - 10 reps Supine Hip Abduction - 1 x daily - 7 x weekly - 2 sets - 10 reps Supine Pelvic Tilt - 1 x daily - 7 x weekly - 3 sets - 10 sec hold Supine March with Posterior Pelvic Tilt - 1 x daily - 7 x weekly - 5 reps

## 2021-04-23 NOTE — Therapy (Signed)
[place]. [place], [place], [address] Phone: [phone]   Fax:  [phone]  Physical Therapy Treatment  Patient Details  Name: Janet Pierce MRN: [id number] Date of Birth: Dec 07, 1950 Referring Provider (PT): Barnet Pall   Encounter Date: 04/23/2021   PT End of Session - 04/23/21 0844     Visit Number 3    Number of Visits 16    Date for PT Re-Evaluation 06/05/21    PT Start Time 0807    PT Stop Time 0845    PT Time Calculation (min) 38 min    Activity Tolerance Patient tolerated treatment well;Patient limited by pain    Behavior During Therapy [facility] for tasks assessed/performed             Past Medical History:  Diagnosis Date   Allergy    Anemia    Anxiety    Arthritis    Cataract    GERD (gastroesophageal reflux disease)    Glaucoma    Hyperlipidemia    Hypertension    Osteoporosis    Retinal detachment    left eye prosthesis   Scoliosis     Past Surgical History:  Procedure Laterality Date   BUNIONECTOMY Left    CATARACT EXTRACTION Right    EYE SURGERY Right    glucoma   FINGER SURGERY Right    ring finger   LIPOMA EXCISION     hip   RETINAL DETACHMENT SURGERY Left    TONSILLECTOMY      There were no vitals filed for this visit.   Subjective Assessment - 04/23/21 0811     Subjective Patient reports increased pain on L side. However, she does report that the stretching seemed to help. She feels it worst at night, but was improved the morning after performing her stretches.    Pertinent History scoliosis, HTN, arthritis, hyperlipedemia    Limitations Lifting;House hold activities    How long can you sit comfortably? As long as she has appropriate back support she is fine.    Currently in Pain? Yes    Pain Score 6     Pain Location Hip    Pain Orientation Left;Lateral    Pain Descriptors / Indicators Other (Comment)    Pain Type Chronic pain    Pain Radiating Towards Does  not radiate.    Pain Onset More than a month ago    Pain Frequency Intermittent                               [facility] Adult PT Treatment/Exercise - 04/23/21 0001       Lumbar Exercises: Stretches   Single Knee to Chest Stretch Right;Left;3 reps;10 seconds    Double Knee to Chest Stretch 3 reps;10 seconds    Piriformis Stretch Right;Left;3 reps;10 seconds      Lumbar Exercises: Aerobic   Recumbent Bike L3 x 5 minutes      Lumbar Exercises: Supine   Bridge Non-compliant;10 reps;1 second    Bridge Limitations Hip abd with G Tband    Other Supine Lumbar Exercises clamshells x 10 against G Tband resistance    Other Supine Lumbar Exercises Supine alternating knee slift win hooklying, emphasize stable core. 5 reps each leg.      Modalities   Modalities Electrical Stimulation;Moist Heat      Moist Heat Therapy   Number Minutes Moist Heat 15 Minutes  Moist Heat Location Lumbar Spine      Electrical Stimulation   Electrical Stimulation Location Lumbar spine    Electrical Stimulation Action IFC    Electrical Stimulation Parameters Patient Tolerance    Electrical Stimulation Goals Pain      Manual Therapy   Manual Therapy Soft tissue mobilization    Soft tissue mobilization L posterior hip, just inferior to iliac crest                     PT Education - 04/23/21 0844     Education Details Updated HEP, importance of consistently performing.    Person(s) Educated Patient    Methods Explanation;Handout;Demonstration    Comprehension Verbalized understanding;Returned demonstration              PT Short Term Goals - 04/23/21 1253       PT SHORT TERM GOAL #1   Title Pt will be independent with initial HEP    Baseline updated to include more strengthening    Time 2    Period Weeks    Status On-going    Target Date 05/08/21               PT Long Term Goals - 04/10/21 1408       PT LONG TERM GOAL #1   Title Pt will be I with  advanced HEP    Time 6    Period Weeks    Status New    Target Date 05/22/21      PT LONG TERM GOAL #2   Title Patient will demonstrate B hip and lumbar ROM to WNL with pain no> 3/10    Baseline Painful in all planes.    Time 6    Period Weeks    Status New    Target Date 05/22/21      PT LONG TERM GOAL #3   Title patient will demosntrate BLE strength of at least 4/5 throughout.    Baseline (3-)-4/5    Time 6    Period Weeks    Status New    Target Date 05/22/21      PT LONG TERM GOAL #4   Title Patient will perform all of her daily activities including housework, bowling, caring for her pet, without increase of pain > 3/10    Time 6    Period Weeks    Status New    Target Date 05/22/21      PT LONG TERM GOAL #5   Title Increase FOTO score to at least 62%    Baseline 59%    Time 6    Period Weeks    Status New    Target Date 05/22/21                   Plan - 04/23/21 0845     Clinical Impression Statement Patient arrived a few minutes late. She reports increaed back pain, but described that when she stretced it felt better. Therapist performed STM in her L gluts, inferior to illiac crest, educated her how to use a tennis ball to provide some self mobs. Then progressed HEP to include more core strength as she is very weak and unstable. Encouraged her to perform consistently for best results.    Personal Factors and Comorbidities Comorbidity 2    Comorbidities HTN, arthritis    Examination-Participation Restrictions Yard Work;Cleaning    Stability/Clinical Decision Making Stable/Uncomplicated    Rehab Potential Good    PT  Frequency Other (comment)   1-2x/week   PT Duration 6 weeks    PT Treatment/Interventions ADLs/Self Care Home Management;Electrical Stimulation;Iontophoresis 4mg /ml Dexamethasone;Moist Heat;Therapeutic exercise;Therapeutic activities;Patient/family education;Manual techniques;Dry needling;Passive range of motion;Taping;Balance  training;Cryotherapy;Gait training;Stair training;Functional mobility training    PT Next Visit Plan HEP, identify any potentil treatment for pain, strengthening and ROM.    PT Home Exercise Plan [email or web address]    Consulted and Agree with Plan of Care Patient             Patient will benefit from skilled therapeutic intervention in order to improve the following deficits and impairments:  Abnormal gait, Decreased range of motion, Difficulty walking, Increased muscle spasms, Decreased strength, Decreased mobility, Decreased balance, Decreased activity tolerance, Pain  Visit Diagnosis: Muscle weakness (generalized)  Cramp and spasm  Difficulty in walking, not elsewhere classified  Pain in left hip  Cervicalgia     Problem List Patient Active Problem List   Diagnosis Date Noted   Osteoporosis 09/28/2019   Vitamin D deficiency 07/08/2018   Benign paroxysmal positional vertigo 05/30/2015   Bunion, right foot 06/01/2014   Tobacco abuse 11/25/2013   Dysphagia 05/19/2013   Special screening for malignant neoplasms, colon 05/19/2013   GERD (gastroesophageal reflux disease) 04/04/2013   Allergic rhinitis 04/04/2013   General medical examination 09/11/2010   PREMATURE VENTRICULAR CONTRACTIONS 11/06/2009   SUPRAVENTRICULAR TACHYCARDIA 10/05/2009   LEUKOCYTOPENIA UNSPECIFIED 09/10/2009   DEPRESSIVE DISORDER 09/06/2009   ECZEMA 09/06/2009   PALPITATIONS, OCCASIONAL 06/08/2009   SHOULDER PAIN, LEFT 05/24/2009   BACK PAIN 05/24/2009   SCOLIOSIS 05/24/2009   LIPOMA 02/02/2008   Hyperlipidemia 02/02/2008   HYPERTENSION, BENIGN ESSENTIAL 02/02/2008    Marcelina Morel, DPT 04/23/2021, 12:54 PM  [place]. [place], [place], [address] Phone: [phone]   Fax:  [phone]  Name: Fallon Haecker MRN: [id number] Date of Birth: Aug 15, 1950

## 2021-04-25 ENCOUNTER — Other Ambulatory Visit: Payer: Self-pay

## 2021-04-25 ENCOUNTER — Encounter: Payer: Self-pay | Admitting: Physical Therapy

## 2021-04-25 ENCOUNTER — Ambulatory Visit: Payer: Medicare Other | Admitting: Physical Therapy

## 2021-04-25 DIAGNOSIS — M25552 Pain in left hip: Secondary | ICD-10-CM

## 2021-04-25 DIAGNOSIS — M6281 Muscle weakness (generalized): Secondary | ICD-10-CM | POA: Diagnosis not present

## 2021-04-25 DIAGNOSIS — R252 Cramp and spasm: Secondary | ICD-10-CM

## 2021-04-25 DIAGNOSIS — R262 Difficulty in walking, not elsewhere classified: Secondary | ICD-10-CM

## 2021-04-25 NOTE — Therapy (Signed)
[place]. [place], [place], [address] Phone: [phone]   Fax:  [phone]  Physical Therapy Treatment  Patient Details  Name: Janet Pierce MRN: [id number] Date of Birth: 1950/12/23 Referring Provider (PT): Barnet Pall   Encounter Date: 04/25/2021   PT End of Session - 04/25/21 1056     Visit Number 4    Number of Visits 16    Date for PT Re-Evaluation 06/05/21    PT Start Time 1021    PT Stop Time 1100    PT Time Calculation (min) 39 min    Activity Tolerance Patient tolerated treatment well;Patient limited by pain    Behavior During Therapy [facility] for tasks assessed/performed             Past Medical History:  Diagnosis Date   Allergy    Anemia    Anxiety    Arthritis    Cataract    GERD (gastroesophageal reflux disease)    Glaucoma    Hyperlipidemia    Hypertension    Osteoporosis    Retinal detachment    left eye prosthesis   Scoliosis     Past Surgical History:  Procedure Laterality Date   BUNIONECTOMY Left    CATARACT EXTRACTION Right    EYE SURGERY Right    glucoma   FINGER SURGERY Right    ring finger   LIPOMA EXCISION     hip   RETINAL DETACHMENT SURGERY Left    TONSILLECTOMY      There were no vitals filed for this visit.   Subjective Assessment - 04/25/21 1024     Subjective Patient reports that her back is still very sore. She says the strengthening exercises aggravated her back, but the stretches helped. She was using a stretch that she had been introduced to last time she received PT, climbing hands up the wall.    Pertinent History scoliosis, HTN, arthritis, hyperlipedemia    Currently in Pain? Yes    Pain Score 5     Pain Location Back    Pain Orientation Left;Posterior;Distal    Pain Descriptors / Indicators Sharp;Aching    Pain Type Chronic pain                               [facility] Adult PT Treatment/Exercise - 04/25/21 0001        Lumbar Exercises: Aerobic   Recumbent Bike L3 x 5 minutes      Lumbar Exercises: Standing   Other Standing Lumbar Exercises wall stretch, sliding hands up wall, moving into trunk extension. Repeated in sit with rolling stool, rolling out and rolling to the R to stretch L trunk. 2 x 30 seconds.      Lumbar Exercises: Seated   Other Seated Lumbar Exercises Seating trunk rotation, placing hands on the mat to her R and weight shifting forward and back over her hands. Therapis tfacilitated trunk mobilization, emphasizing flexion to open L lower trunk.      Lumbar Exercises: Prone   Other Prone Lumbar Exercises Attempted to gentle prone push ups as patient reports that sleeping on her stomach feels good. However, extension seemed to increase pain, so stopped.      Modalities   Modalities Electrical Stimulation;Moist Heat      Moist Heat Therapy   Number Minutes Moist Heat 15 Minutes    Moist Heat Location Lumbar Spine  Acupuncturist Location Lumbar spine    Electrical Stimulation Action IFC    Electrical Stimulation Parameters Patient Tolerance    Electrical Stimulation Goals Pain                     PT Education - 04/25/21 1055     Education Details Added some lower trunk stretch to open L side of trunk.    Person(s) Educated Patient    Methods Explanation;Demonstration    Comprehension Returned demonstration;Verbalized understanding              PT Short Term Goals - 04/25/21 1101       PT SHORT TERM GOAL #1   Title Pt will be independent with initial HEP    Time 2    Period Weeks    Status On-going    Target Date 05/08/21               PT Long Term Goals - 04/10/21 1408       PT LONG TERM GOAL #1   Title Pt will be I with advanced HEP    Time 6    Period Weeks    Status New    Target Date 05/22/21      PT LONG TERM GOAL #2   Title Patient will demonstrate B hip and lumbar ROM to WNL with pain no> 3/10     Baseline Painful in all planes.    Time 6    Period Weeks    Status New    Target Date 05/22/21      PT LONG TERM GOAL #3   Title patient will demosntrate BLE strength of at least 4/5 throughout.    Baseline (3-)-4/5    Time 6    Period Weeks    Status New    Target Date 05/22/21      PT LONG TERM GOAL #4   Title Patient will perform all of her daily activities including housework, bowling, caring for her pet, without increase of pain > 3/10    Time 6    Period Weeks    Status New    Target Date 05/22/21      PT LONG TERM GOAL #5   Title Increase FOTO score to at least 62%    Baseline 59%    Time 6    Period Weeks    Status New    Target Date 05/22/21                   Plan - 04/25/21 1056     Clinical Impression Statement Patient reported increased pain today. She says she woke up with the pain, but it also seemed to increase after performing strengthening exercises. She demonstrated a trunk ext exercise that had been given in her last round of PT, reaching up on the wall. She feels it helps the pain. Therapsit demosntrated the stretch from sitting, sliding stool out in front and then rolling it to the R to stretch L side, which she felt helped. The performed seated rotation which she also reported relief. Plan to add additional trunk stability exercises.    Personal Factors and Comorbidities Comorbidity 2    Comorbidities HTN, arthritis    Examination-Participation Restrictions Yard Work;Cleaning    Stability/Clinical Decision Making Stable/Uncomplicated    Clinical Decision Making Low    Rehab Potential Good    PT Frequency Other (comment)   1-2x/week   PT Duration  6 weeks    PT Treatment/Interventions ADLs/Self Care Home Management;Electrical Stimulation;Iontophoresis 4mg /ml Dexamethasone;Moist Heat;Therapeutic exercise;Therapeutic activities;Patient/family education;Manual techniques;Dry needling;Passive range of motion;Taping;Balance  training;Cryotherapy;Gait training;Stair training;Functional mobility training    PT Next Visit Plan Trunk stabilization exercises.    PT Home Exercise Plan [email or web address]    Consulted and Agree with Plan of Care Patient             Patient will benefit from skilled therapeutic intervention in order to improve the following deficits and impairments:  Abnormal gait, Decreased range of motion, Difficulty walking, Increased muscle spasms, Decreased strength, Decreased mobility, Decreased balance, Decreased activity tolerance, Pain  Visit Diagnosis: Muscle weakness (generalized)  Cramp and spasm  Difficulty in walking, not elsewhere classified  Pain in left hip     Problem List Patient Active Problem List   Diagnosis Date Noted   Osteoporosis 09/28/2019   Vitamin D deficiency 07/08/2018   Benign paroxysmal positional vertigo 05/30/2015   Bunion, right foot 06/01/2014   Tobacco abuse 11/25/2013   Dysphagia 05/19/2013   Special screening for malignant neoplasms, colon 05/19/2013   GERD (gastroesophageal reflux disease) 04/04/2013   Allergic rhinitis 04/04/2013   General medical examination 09/11/2010   PREMATURE VENTRICULAR CONTRACTIONS 11/06/2009   SUPRAVENTRICULAR TACHYCARDIA 10/05/2009   LEUKOCYTOPENIA UNSPECIFIED 09/10/2009   DEPRESSIVE DISORDER 09/06/2009   ECZEMA 09/06/2009   PALPITATIONS, OCCASIONAL 06/08/2009   SHOULDER PAIN, LEFT 05/24/2009   BACK PAIN 05/24/2009   SCOLIOSIS 05/24/2009   LIPOMA 02/02/2008   Hyperlipidemia 02/02/2008   HYPERTENSION, BENIGN ESSENTIAL 02/02/2008    Marcelina Morel, DPT 04/25/2021, 11:02 AM  [place]. [place], [place], [address] Phone: [phone]   Fax:  [phone]  Name: Janet Pierce MRN: [id number] Date of Birth: 03-23-1951

## 2021-05-01 ENCOUNTER — Ambulatory Visit: Payer: Medicare Other | Admitting: Physical Therapy

## 2021-05-03 ENCOUNTER — Ambulatory Visit (INDEPENDENT_AMBULATORY_CARE_PROVIDER_SITE_OTHER): Payer: Medicare Other | Admitting: Family Medicine

## 2021-05-03 DIAGNOSIS — M810 Age-related osteoporosis without current pathological fracture: Secondary | ICD-10-CM

## 2021-05-03 MED ORDER — DENOSUMAB 60 MG/ML ~~LOC~~ SOSY
60.0000 mg | PREFILLED_SYRINGE | Freq: Once | SUBCUTANEOUS | Status: AC
  Administered 2021-05-03: 60 mg via SUBCUTANEOUS

## 2021-05-03 NOTE — Progress Notes (Signed)
Janet Pierce is a 71 y.o. female presents to the office today for prolia injections, per physician's orders.   [name]

## 2021-05-06 ENCOUNTER — Ambulatory Visit (INDEPENDENT_AMBULATORY_CARE_PROVIDER_SITE_OTHER)
Admission: RE | Admit: 2021-05-06 | Discharge: 2021-05-06 | Disposition: A | Discharge status: Home / Self Care | Payer: Medicare Other | Source: Ambulatory Visit | Attending: Nurse Practitioner | Admitting: Nurse Practitioner

## 2021-05-06 ENCOUNTER — Other Ambulatory Visit: Payer: Self-pay

## 2021-05-06 ENCOUNTER — Other Ambulatory Visit (INDEPENDENT_AMBULATORY_CARE_PROVIDER_SITE_OTHER): Payer: Medicare Other

## 2021-05-06 ENCOUNTER — Ambulatory Visit: Payer: Medicare Other | Admitting: Nurse Practitioner

## 2021-05-06 VITALS — BP 114/64 | HR 91 | Ht 63.0 in | Wt 147.4 lb

## 2021-05-06 DIAGNOSIS — R140 Abdominal distension (gaseous): Secondary | ICD-10-CM | POA: Diagnosis not present

## 2021-05-06 DIAGNOSIS — D509 Iron deficiency anemia, unspecified: Secondary | ICD-10-CM

## 2021-05-06 LAB — CBC
HCT: 37.6 % (ref 36.0–46.0)
Hemoglobin: 12.1 g/dL (ref 12.0–15.0)
MCHC: 32.2 g/dL (ref 30.0–36.0)
MCV: 84.7 fl (ref 78.0–100.0)
Platelets: 276 10*3/uL (ref 150.0–400.0)
RBC: 4.44 Mil/uL (ref 3.87–5.11)
RDW: 13.6 % (ref 11.5–15.5)
WBC: 4.1 10*3/uL (ref 4.0–10.5)

## 2021-05-06 NOTE — Patient Instructions (Addendum)
You have been given a testing kit to check for small intestine bacterial overgrowth (SIBO) which is completed by a company named [employer].   Make sure to return your test in the mail using the return mailing label given to you along with the kit. Your demographic and insurance information have already been sent to the company and they should be in contact with you over the next week regarding this test.   [facility] will collect an upfront charge of $99.74 for commercial insurance plans and $209.74 is you are paying cash. Make sure to discuss with [facility] PRIOR to having the test if they have gotten informatoin from your insurance company as to how much your testing will cost out of pocket, if any. Please keep in mind that you will be getting a call from phone number [phone] or a similar number. If you do not hear from them within this time frame, please call our office at [phone].   IMAGING Your provider has requested that you have an abdominal x ray before leaving today. Please go to the basement floor to our Radiology department for the test.  LABS:  Lab work has been ordered for you today. Our lab is located in the basement. Press "B" on the elevator. The lab is located at the first door on the left as you exit the elevator.  HEALTHCARE LAWS AND MY CHART RESULTS: Due to recent changes in healthcare laws, you may see the results of your imaging and laboratory studies on MyChart before your provider has had a chance to review them.   We understand that in some cases there may be results that are confusing or concerning to you. Not all laboratory results come back in the same time frame and the provider may be waiting for multiple results in order to interpret others.  Please give [place] 48 hours in order for your provider to thoroughly review all the results before contacting the office for clarification of your results.   RECOMMENDATIONS: IBgard once a day as needed  for abdominal pain/bloating. Lactaid 1-2 tablets with each dairy product. Follow up with GYN to consider pelvic ultrasound due to abdominal bloating.  It was great seeing you today! Thank you for entrusting me with your care and choosing [facility].  Noralyn Pick, CRNP  The [place] [facility] providers would like to encourage you to use [facility] to communicate with providers for non-urgent requests or questions.  Due to long hold times on the telephone, sending your provider a message by [facility] may be faster and more efficient way to get a response. Please allow 48 business hours for a response.  Please remember that this is for non-urgent requests/questions. If you are age 71 or older, your body mass index should be between 23-30. Your Body mass index is 26.11 kg/m. If this is out of the aforementioned range listed, please consider follow up with your Primary Care Provider.  If you are age 71 or younger, your body mass index should be between 19-25. Your Body mass index is 26.11 kg/m. If this is out of the aformentioned range listed, please consider follow up with your Primary Care Provider.

## 2021-05-06 NOTE — Progress Notes (Signed)
05/06/2021 Janet Pierce [id number] Mar 30, 1951   Chief Complaint: Abdomina bloat   History of Present Illness: Janet Pierce is a 71 year old female with a past medical history of anxiety, arthritis, hypertension, hyperlipidemia, osteoporosis, left retinal detachment, vitamin D deficiency, anemia, GERD and tubular adenomatous colon polyps. She was last seen in office by Nicoletta Ba PA-C 11/15/2020 due to having abdominal bloat. A CTAP  11/21/2020 was unrevealing.  She underwent an EGD and colonoscopy 12/10/2020, the EGD showed a 1 cm hiatal hernia, mild gastritis and duodenal mucosal lymphangiectasia.  Gastric biopsies were negative for H. pylori and duodenal biopsies were negative for celiac disease.  The colonoscopy identified 3 tubular adenomatous polyps which were removed from the colon and sigmoid diverticulosis.  She presents her office today for persistent abdominal bloat with intermittent upper abdominal hunger type pain.  No nausea or vomiting.  No heartburn, dysphagia but has increased belching.  She passes a normal formed brown bowel movement daily and feels emptied.  No rectal bleeding or black stools.  She passes a moderate amount of gas per the rectum.  She started taking sucralfate to 3 times daily 2 weeks ago and her upper abdominal hunger pain significantly diminished.  She continues to feel bloated on a daily basis which has been progressively worsening over the past 4 months.  Dairy intake is limited, she uses evaporated milk in her coffee and eats cheese a few days weekly.  No recent antibiotics.  She does not take a probiotic at this time.  Her last pelvic exam was more than 1 year ago.  No fevers.  No weight loss.  She has a history of iron deficiency anemia.  EGD and colonoscopy 11/2020 did not identify any cause for her IDA as noted above.  Laboratory studies 04/04/2021 showed a hemoglobin level 11.9.  Her most recent iron levels 02/21/2021 were normal, she remained on oral iron at  the time of this lab draw.  She stopped taking her iron supplement about 4 weeks ago because she did not want to take it any longer  CTAP with contrast 11/21/2020: Colonic diverticulosis. No radiographic evidence of diverticulitis or other acute findings.  Severe thoracolumbar levoscoliosis.  Aortic Atherosclerosis   EGD 12/10/2020: - Esophagogastric landmarks identified. - 1 cm hiatal hernia. - Normal esophagus otherwise - no stenosis / stricture noted. Empiric dilation not done since symptoms mild / resolved. - Mild antral gastritis. - Normal stomach otherwise - biopsies taken to rule out H pylori - Duodenal mucosal lymphangiectasia. - Normal duodenum otherwise. Biopsies were taken with a cold forceps for evaluation of celiac disease.  Colonoscopy 12/10/2020: The examined portion of the ileum was normal. - Two 2 to 4 mm polyps in the ascending colon, removed with a cold snare. Resected and retrieved. - Diverticulosis in the sigmoid colon. - One 3 mm polyp in the sigmoid colon, removed with a cold snare. Resected and retrieved. - Internal hemorrhoids. - The examination was otherwise normal - 3 to 5 year colonoscopy recall  Path Report: Diagnosis 1. Surgical [P], duodenal bx - DUODENAL MUCOSA WITH NO SPECIFIC HISTOPATHOLOGIC CHANGES - NEGATIVE FOR INCREASED INTRAEPITHELIAL LYMPHOCYTES OR VILLOUS ARCHITECTURAL CHANGES 2. Surgical [P], gastric bx - GASTRIC ANTRAL AND OXYNTIC MUCOSA WITH NO SPECIFIC HISTOPATHOLOGIC CHANGES - WARTHIN STARRY STAIN IS NEGATIVE FOR HELICOBACTER PYLORI 3. Surgical [P], colon, ascending x2, polyp (2) - TUBULAR ADENOMA - NEGATIVE FOR HIGH-GRADE DYSPLASIA OR MALIGNANCY 4. Surgical [P], colon, sigmoid x1, polyp (1) - TUBULAR ADENOMA -  NEGATIVE FOR HIGH-GRADE DYSPLASIA OR MALIGNANCY  CBC Latest Ref Rng & Units 04/04/2021 02/21/2021 11/15/2020  WBC 4.0 - 10.5 K/uL 4.0 4.5 4.8  Hemoglobin 12.0 - 15.0 g/dL 11.9(L) 12.3 11.7(L)  Hematocrit 36.0 - 46.0 % 36.3  37.2 34.5(L)  Platelets 150.0 - 400.0 K/uL 280.0 281.0 254.0    CMP Latest Ref Rng & Units 04/04/2021 11/15/2020 08/08/2020  Glucose 70 - 99 mg/dL 92 116(H) 77  BUN 6 - 23 mg/dL 12 12 11   Creatinine 0.40 - 1.20 mg/dL 0.69 0.76 0.70  Sodium 135 - 145 mEq/L 140 140 139  Potassium 3.5 - 5.1 mEq/L 3.9 3.6 3.9  Chloride 96 - 112 mEq/L 103 102 102  CO2 19 - 32 mEq/L 31 30 30   Calcium 8.4 - 10.5 mg/dL 9.5 9.5 9.6  Total Protein 6.0 - 8.3 g/dL 6.9 - 7.5  Total Bilirubin 0.2 - 1.2 mg/dL 0.6 - 0.5  Alkaline Phos 39 - 117 U/L 59 - 61  AST 0 - 37 U/L 17 - 13  ALT 0 - 35 U/L 20 - 11    Iron 75, iron saturation 20 and ferritin 39 on 02/21/2021  Past Medical History:  Diagnosis Date   Allergy    Anemia    Anxiety    Arthritis    Cataract    GERD (gastroesophageal reflux disease)    Glaucoma    Hyperlipidemia    Hypertension    Osteoporosis    Retinal detachment    left eye prosthesis   Scoliosis     Current Medications, Allergies, Past Medical History, Past Surgical History, Family History and Social History were reviewed in [employer] record.  Review of Systems:   Constitutional: Negative for fever, sweats, chills or weight loss.  Respiratory: Negative for shortness of breath.   Cardiovascular: Negative for chest pain, palpitations and leg swelling.  Gastrointestinal: See HPI.  Musculoskeletal: Negative for back pain or muscle aches.  Neurological: Negative for dizziness, headaches or paresthesias.   Physical Exam: BP 114/64    Pulse 91    Ht 5\' 3"  (1.6 m)    Wt 147 lb 6.4 oz (66.9 kg)    BMI 26.11 kg/m   General: 71 year old female in no acute distress Head: Normocephalic and atraumatic. Eyes: No scleral icterus. Left eye prosthesis intact. Ears: Normal auditory acuity. Mouth: Dentition intact. No ulcers or lesions.  Lungs: Clear throughout to auscultation. Heart: Regular rate and rhythm, no murmur. Abdomen: Soft, nontender.  Central abdominal  distention, tympanic to percussion.  No masses or hepatomegaly. Normal bowel sounds x 4 quadrants.  Rectal: Deferred. Musculoskeletal: Symmetrical with no gross deformities. Extremities: No edema. Neurological: Alert oriented x 4. No focal deficits.  Psychological: Alert and cooperative. Normal mood and affect  Assessment and Recommendations:  66) 71 year old female with abdominal bloat.  Moderate upper abdominal distention on exam.  No constipation.  CTAP 11/21/2020 showed a normal liver, gallbladder and pancreas, diverticulosis without evidence of diverticulitis. -KUB to assess for stool-filled colon -IBgard 1 p.o. twice daily -Lactate 1 or 2 tabs with each dairy product -SIBO breath test to rule out small intestinal bacterial overgrowth -Recommended GYN follow-up, consider pelvic sonogram to evaluate the ovaries (her last GYN exam was more than 1 year ago)  2) GERD. Epigastric hunger type pain, significantly improved after starting Sucralfate 3 times daily 2 weeks ago.  EGD 11/2020 showed mild gastritis. -Okay to continue Sucralfate 1 p.o. twice daily for the next 10 days for now, instructed  patient not to take within 2 hours of any other medication -Continue Pantoprazole 40 mg bid  3) IDA, unclear etiology. EGD/colonoscopy 12/10/2020 findings did not explain cause for IDA. Hg 12.3 -> 11.9 on 04/04/2021. Normal iron levels 02/21/2021.  Patient elected to stop oral iron 4 weeks ago. -CBC and iron panel -Consider small bowel capsule endoscopy if the above lab results show persistent iron deficiency anemia  4) History of 3 tubular adenomatous colon polyps per colonoscopy 12/10/2020 -Recall colonoscopy 11/2023 versus 11/2025  5) Vitamin D deficiency

## 2021-05-07 ENCOUNTER — Other Ambulatory Visit: Payer: Self-pay

## 2021-05-07 DIAGNOSIS — D509 Iron deficiency anemia, unspecified: Secondary | ICD-10-CM

## 2021-05-07 LAB — IRON,TIBC AND FERRITIN PANEL
%SAT: 22 % (calc) (ref 16–45)
Ferritin: 32 ng/mL (ref 16–288)
Iron: 89 ug/dL (ref 45–160)
TIBC: 396 mcg/dL (calc) (ref 250–450)

## 2021-05-08 ENCOUNTER — Other Ambulatory Visit: Payer: Self-pay

## 2021-05-08 ENCOUNTER — Encounter: Payer: Self-pay | Admitting: Physical Therapy

## 2021-05-08 ENCOUNTER — Ambulatory Visit: Payer: Medicare Other | Admitting: Physical Therapy

## 2021-05-08 DIAGNOSIS — M6281 Muscle weakness (generalized): Secondary | ICD-10-CM | POA: Diagnosis not present

## 2021-05-08 DIAGNOSIS — R252 Cramp and spasm: Secondary | ICD-10-CM

## 2021-05-08 DIAGNOSIS — R262 Difficulty in walking, not elsewhere classified: Secondary | ICD-10-CM

## 2021-05-08 DIAGNOSIS — M25552 Pain in left hip: Secondary | ICD-10-CM

## 2021-05-08 NOTE — Progress Notes (Signed)
Agree with assessment and plan as outlined.  

## 2021-05-08 NOTE — Patient Instructions (Signed)
Access Code: [email or web address] URL: [email or web address] Date: 05/08/2021 Prepared by: Ethel Rana  Exercises Supine Pelvic Tilt - 1 x daily - 7 x weekly - 3 sets - 10 sec hold Supine March with Posterior Pelvic Tilt - 1 x daily - 7 x weekly - 5 reps Seated 3 Way Exercise Ball Roll Out Stretch - 1 x daily - 7 x weekly - 3 sets - 30 hold Quadruped Cat Cow - 1 x daily - 7 x weekly - 3 sets Quadruped Alternating Arm Lift - 1 x daily - 7 x weekly - 1 sets - 10 reps Quadruped Alternating Leg Extensions - 1 x daily - 7 x weekly - 1 sets - 10 reps Bird Dog - 1 x daily - 7 x weekly - 1 sets - 10 reps Seated Thoracic Flexion and Rotation with Arms Crossed - 1 x daily - 7 x weekly - 3 sets - 20 hold

## 2021-05-08 NOTE — Therapy (Signed)
[address] [place], [place], [address] Phone: [phone]   Fax:  [phone]  Physical Therapy Treatment  Patient Details  Name: Janet Pierce MRN: [id number] Date of Birth: [birth year] Referring Provider (PT): Barnet Pall   Encounter Date: 05/08/2021   PT End of Session - 05/08/21 1104     Visit Number 5    Number of Visits 16    Date for PT Re-Evaluation 06/05/21    PT Start Time 1020    PT Stop Time 1059    PT Time Calculation (min) 39 min    Activity Tolerance Patient tolerated treatment well;Patient limited by pain    Behavior During Therapy [facility] for tasks assessed/performed             Past Medical History:  Diagnosis Date   Allergy    Anemia    Anxiety    Arthritis    Cataract    GERD (gastroesophageal reflux disease)    Glaucoma    Hyperlipidemia    Hypertension    Osteoporosis    Retinal detachment    left eye prosthesis   Scoliosis     Past Surgical History:  Procedure Laterality Date   BUNIONECTOMY Left    CATARACT EXTRACTION Right    EYE SURGERY Right    glucoma   FINGER SURGERY Right    ring finger   LIPOMA EXCISION     hip   RETINAL DETACHMENT SURGERY Left    TONSILLECTOMY      There were no vitals filed for this visit.   Subjective Assessment - 05/08/21 1025     Subjective Patient reports that the back pain is still present. She feels it mostly at night after going to bed and having to get back up to use the restroom, or when bending over during housework.    Pertinent History scoliosis, HTN, arthritis, hyperlipedemia    Currently in Pain? No/denies                               [facility] Adult PT Treatment/Exercise - 05/08/21 0001       Lumbar Exercises: Stretches   Other Lumbar Stretch Exercise Seated rolling stool stretch, 3 x 30 second forward, then 3 x 30 second to L and to R.      Lumbar Exercises: Aerobic   UBE (Upper Arm Bike) L 1.5,  3 min forwrad and back                     PT Education - 05/08/21 1054     Education Details Updated HEP    Person(s) Educated Patient    Methods Explanation;Demonstration;Handout    Comprehension Verbalized understanding;Returned demonstration              PT Short Term Goals - 05/08/21 1037       PT SHORT TERM GOAL #1   Title Pt will be independent with initial HEP    Baseline Patient having trouble with the strenghtening, so modified.    Time 1    Period Weeks    Status On-going    Target Date 05/15/21               PT Long Term Goals - 04/10/21 1408       PT LONG TERM GOAL #1   Title Pt will be I with advanced HEP    Time 6  Period Weeks    Status New    Target Date 05/22/21      PT LONG TERM GOAL #2   Title Patient will demonstrate B hip and lumbar ROM to WNL with pain no> 3/10    Baseline Painful in all planes.    Time 6    Period Weeks    Status New    Target Date 05/22/21      PT LONG TERM GOAL #3   Title patient will demosntrate BLE strength of at least 4/5 throughout.    Baseline (3-)-4/5    Time 6    Period Weeks    Status New    Target Date 05/22/21      PT LONG TERM GOAL #4   Title Patient will perform all of her daily activities including housework, bowling, caring for her pet, without increase of pain > 3/10    Time 6    Period Weeks    Status New    Target Date 05/22/21      PT LONG TERM GOAL #5   Title Increase FOTO score to at least 62%    Baseline 59%    Time 6    Period Weeks    Status New    Target Date 05/22/21                   Plan - 05/08/21 1104     Clinical Impression Statement Patient reports her back feels better, but it still bothers her when she has to move around at night or reaches to the floor. Updated HEP to include more trunk stabilization, which she tolerated well.    Personal Factors and Comorbidities Comorbidity 2    Comorbidities HTN, arthritis    Examination-Participation  Restrictions Yard Work;Cleaning    Stability/Clinical Decision Making Stable/Uncomplicated    Clinical Decision Making Low    Rehab Potential Good    PT Frequency Other (comment)   1-2x/week   PT Duration 3 weeks    PT Treatment/Interventions ADLs/Self Care Home Management;Electrical Stimulation;Iontophoresis 4mg /ml Dexamethasone;Moist Heat;Therapeutic exercise;Therapeutic activities;Patient/family education;Manual techniques;Dry needling;Passive range of motion;Taping;Balance training;Cryotherapy;Gait training;Stair training;Functional mobility training    PT Next Visit Plan Trunk stabilization exercises.    PT Home Exercise Plan [email or web address]    Consulted and Agree with Plan of Care Patient             Patient will benefit from skilled therapeutic intervention in order to improve the following deficits and impairments:  Abnormal gait, Decreased range of motion, Difficulty walking, Increased muscle spasms, Decreased strength, Decreased mobility, Decreased balance, Decreased activity tolerance, Pain  Visit Diagnosis: Muscle weakness (generalized)  Cramp and spasm  Difficulty in walking, not elsewhere classified  Pain in left hip     Problem List Patient Active Problem List   Diagnosis Date Noted   Osteoporosis 09/28/2019   Vitamin D deficiency 07/08/2018   Benign paroxysmal positional vertigo 05/30/2015   Bunion, right foot 06/01/2014   Tobacco abuse 11/25/2013   Dysphagia 05/19/2013   Special screening for malignant neoplasms, colon 05/19/2013   GERD (gastroesophageal reflux disease) 04/04/2013   Allergic rhinitis 04/04/2013   General medical examination 09/11/2010   PREMATURE VENTRICULAR CONTRACTIONS 11/06/2009   SUPRAVENTRICULAR TACHYCARDIA 10/05/2009   LEUKOCYTOPENIA UNSPECIFIED 09/10/2009   DEPRESSIVE DISORDER 09/06/2009   ECZEMA 09/06/2009   PALPITATIONS, OCCASIONAL 06/08/2009   SHOULDER PAIN, LEFT 05/24/2009   BACK PAIN 05/24/2009   SCOLIOSIS 05/24/2009   LIPOMA  02/02/2008   Hyperlipidemia 02/02/2008   HYPERTENSION,  BENIGN ESSENTIAL 02/02/2008    Marcelina Morel, DPT 05/08/2021, 11:07 AM  [address] [place], [place], [address] Phone: [phone]   Fax:  [phone]  Name: Janet Pierce MRN: [id number] Date of Birth: 06-02-1950

## 2021-05-15 ENCOUNTER — Other Ambulatory Visit: Payer: Self-pay

## 2021-05-15 ENCOUNTER — Encounter: Payer: Self-pay | Admitting: Physical Therapy

## 2021-05-15 ENCOUNTER — Ambulatory Visit: Payer: Medicare Other | Admitting: Physical Therapy

## 2021-05-15 DIAGNOSIS — M542 Cervicalgia: Secondary | ICD-10-CM

## 2021-05-15 DIAGNOSIS — M6281 Muscle weakness (generalized): Secondary | ICD-10-CM | POA: Diagnosis not present

## 2021-05-15 DIAGNOSIS — R262 Difficulty in walking, not elsewhere classified: Secondary | ICD-10-CM

## 2021-05-15 DIAGNOSIS — M25552 Pain in left hip: Secondary | ICD-10-CM

## 2021-05-15 DIAGNOSIS — R252 Cramp and spasm: Secondary | ICD-10-CM

## 2021-05-15 NOTE — Therapy (Signed)
[facility] [place], [place], [address] Phone: [phone]   Fax:  [phone]  Physical Therapy Treatment  Patient Details  Name: Janet Pierce MRN: [id number] Date of Birth: 11-02-1950 Referring Provider (PT): Barnet Pall   Encounter Date: 05/15/2021   PT End of Session - 05/15/21 1057     Visit Number 6    Number of Visits 16    Date for PT Re-Evaluation 06/05/21    PT Start Time 1017    PT Stop Time 1058    PT Time Calculation (min) 41 min    Activity Tolerance Patient tolerated treatment well;Patient limited by pain    Behavior During Therapy [facility] for tasks assessed/performed             Past Medical History:  Diagnosis Date   Allergy    Anemia    Anxiety    Arthritis    Cataract    GERD (gastroesophageal reflux disease)    Glaucoma    Hyperlipidemia    Hypertension    Osteoporosis    Retinal detachment    left eye prosthesis   Scoliosis     Past Surgical History:  Procedure Laterality Date   BUNIONECTOMY Left    CATARACT EXTRACTION Right    EYE SURGERY Right    glucoma   FINGER SURGERY Right    ring finger   LIPOMA EXCISION     hip   RETINAL DETACHMENT SURGERY Left    TONSILLECTOMY      There were no vitals filed for this visit.   Subjective Assessment - 05/15/21 1018     Subjective Patient reports stiffness in her neck. X ray in 4/22 shows diffuse degenerative changes and facet arthropathy. She reports that in the past she has used stretching and heat to address the pain.    Pertinent History scoliosis, HTN, arthritis, hyperlipedemia    Currently in Pain? Yes    Pain Score 7     Pain Location Neck    Pain Orientation Lower;Distal;Left;Right    Pain Descriptors / Indicators Tightness    Pain Type Chronic pain    Pain Onset More than a month ago    Pain Frequency Constant                               [facility] Adult PT Treatment/Exercise - 05/15/21  0001       Lumbar Exercises: Stretches   Single Knee to Chest Stretch Right;Left;1 rep;20 seconds    Single Knee to Chest Stretch Limitations After KTC, pulled knee to opposite shoulder x 20 sec.    Other Lumbar Stretch Exercise Cervical distraction with rotation to each side and then LS and Up Traps stretches for each side.      Lumbar Exercises: Aerobic   UBE (Upper Arm Bike) L1, 3 min forward and back      Lumbar Exercises: Seated   Other Seated Lumbar Exercises Thoracic mobs in sit into flex/ext and rotation    Other Seated Lumbar Exercises Seated trunk weight shifts, place B hands to R, perofrm weiht shifts over diagonal, x 5 in each direction.      Lumbar Exercises: Supine   Other Supine Lumbar Exercises Active cervical rotation to each side.      Moist Heat Therapy   Number Minutes Moist Heat 10 Minutes    Moist Heat Location Cervical  PT Education - 05/15/21 1032     Education Details Cervical exercises.    Person(s) Educated Patient    Methods Explanation;Demonstration    Comprehension Returned demonstration;Verbalized understanding              PT Short Term Goals - 05/08/21 1037       PT SHORT TERM GOAL #1   Title Pt will be independent with initial HEP    Baseline Patient having trouble with the strenghtening, so modified.    Time 1    Period Weeks    Status On-going    Target Date 05/15/21               PT Long Term Goals - 04/10/21 1408       PT LONG TERM GOAL #1   Title Pt will be I with advanced HEP    Time 6    Period Weeks    Status New    Target Date 05/22/21      PT LONG TERM GOAL #2   Title Patient will demonstrate B hip and lumbar ROM to WNL with pain no> 3/10    Baseline Painful in all planes.    Time 6    Period Weeks    Status New    Target Date 05/22/21      PT LONG TERM GOAL #3   Title patient will demosntrate BLE strength of at least 4/5 throughout.    Baseline (3-)-4/5    Time 6     Period Weeks    Status New    Target Date 05/22/21      PT LONG TERM GOAL #4   Title Patient will perform all of her daily activities including housework, bowling, caring for her pet, without increase of pain > 3/10    Time 6    Period Weeks    Status New    Target Date 05/22/21      PT LONG TERM GOAL #5   Title Increase FOTO score to at least 62%    Baseline 59%    Time 6    Period Weeks    Status New    Target Date 05/22/21                   Plan - 05/15/21 1033     Clinical Impression Statement Patietn arrives today with C/O shoulder and neck pain. Treatemnt addressed self stretching and scapular ROM/stability to control shoulder and neck pain in order to avoid compensatory movements which may further aggravate her low back.    Personal Factors and Comorbidities Comorbidity 2    Comorbidities HTN, arthritis    Examination-Participation Restrictions Yard Work;Cleaning    Stability/Clinical Decision Making Stable/Uncomplicated    Clinical Decision Making Low    Rehab Potential Good    PT Frequency Other (comment)   1-2x/week   PT Duration 2 weeks    PT Treatment/Interventions ADLs/Self Care Home Management;Electrical Stimulation;Iontophoresis 4mg /ml Dexamethasone;Moist Heat;Therapeutic exercise;Therapeutic activities;Patient/family education;Manual techniques;Dry needling;Passive range of motion;Taping;Balance training;Cryotherapy;Gait training;Stair training;Functional mobility training    PT Next Visit Plan Trunk stabilization exercises.    PT Home Exercise Plan [id number]    Consulted and Agree with Plan of Care Patient             Patient will benefit from skilled therapeutic intervention in order to improve the following deficits and impairments:  Abnormal gait, Decreased range of motion, Difficulty walking, Increased muscle spasms, Decreased strength, Decreased mobility, Decreased balance, Decreased activity  tolerance, Pain  Visit Diagnosis: Muscle weakness  (generalized)  Cramp and spasm  Difficulty in walking, not elsewhere classified  Pain in left hip  Cervicalgia     Problem List Patient Active Problem List   Diagnosis Date Noted   Osteoporosis 09/28/2019   Vitamin D deficiency 07/08/2018   Benign paroxysmal positional vertigo 05/30/2015   Bunion, right foot 06/01/2014   Tobacco abuse 11/25/2013   Dysphagia 05/19/2013   Special screening for malignant neoplasms, colon 05/19/2013   GERD (gastroesophageal reflux disease) 04/04/2013   Allergic rhinitis 04/04/2013   General medical examination 09/11/2010   PREMATURE VENTRICULAR CONTRACTIONS 11/06/2009   SUPRAVENTRICULAR TACHYCARDIA 10/05/2009   LEUKOCYTOPENIA UNSPECIFIED 09/10/2009   DEPRESSIVE DISORDER 09/06/2009   ECZEMA 09/06/2009   PALPITATIONS, OCCASIONAL 06/08/2009   SHOULDER PAIN, LEFT 05/24/2009   BACK PAIN 05/24/2009   SCOLIOSIS 05/24/2009   LIPOMA 02/02/2008   Hyperlipidemia 02/02/2008   HYPERTENSION, BENIGN ESSENTIAL 02/02/2008    Marcelina Morel, DPT 05/15/2021, 10:59 AM  [address]. [place], [place], [address] Phone: [phone]   Fax:  [phone]  Name: Felica Chargois MRN: [id number] Date of Birth: April 22, 1950

## 2021-05-20 ENCOUNTER — Telehealth: Payer: Self-pay | Admitting: Family Medicine

## 2021-05-20 ENCOUNTER — Encounter: Payer: Self-pay | Admitting: Registered Nurse

## 2021-05-20 ENCOUNTER — Other Ambulatory Visit: Payer: Self-pay

## 2021-05-20 ENCOUNTER — Ambulatory Visit: Payer: Medicare Other | Admitting: Registered Nurse

## 2021-05-20 ENCOUNTER — Other Ambulatory Visit: Payer: Self-pay | Admitting: Family Medicine

## 2021-05-20 VITALS — BP 121/65 | HR 100 | Temp 98.2°F | Resp 18 | Ht 63.0 in | Wt 145.0 lb

## 2021-05-20 DIAGNOSIS — R109 Unspecified abdominal pain: Secondary | ICD-10-CM

## 2021-05-20 DIAGNOSIS — G8929 Other chronic pain: Secondary | ICD-10-CM

## 2021-05-20 DIAGNOSIS — M545 Low back pain, unspecified: Secondary | ICD-10-CM | POA: Diagnosis not present

## 2021-05-20 LAB — CBC WITH DIFFERENTIAL/PLATELET
Basophils Absolute: 0 10*3/uL (ref 0.0–0.1)
Basophils Relative: 0.9 % (ref 0.0–3.0)
Eosinophils Absolute: 0.1 10*3/uL (ref 0.0–0.7)
Eosinophils Relative: 1.9 % (ref 0.0–5.0)
HCT: 38.2 % (ref 36.0–46.0)
Hemoglobin: 12.6 g/dL (ref 12.0–15.0)
Lymphocytes Relative: 39.8 % (ref 12.0–46.0)
Lymphs Abs: 1.6 10*3/uL (ref 0.7–4.0)
MCHC: 32.9 g/dL (ref 30.0–36.0)
MCV: 83.6 fl (ref 78.0–100.0)
Monocytes Absolute: 0.3 10*3/uL (ref 0.1–1.0)
Monocytes Relative: 7.5 % (ref 3.0–12.0)
Neutro Abs: 2 10*3/uL (ref 1.4–7.7)
Neutrophils Relative %: 49.9 % (ref 43.0–77.0)
Platelets: 278 10*3/uL (ref 150.0–400.0)
RBC: 4.57 Mil/uL (ref 3.87–5.11)
RDW: 13.5 % (ref 11.5–15.5)
WBC: 4 10*3/uL (ref 4.0–10.5)

## 2021-05-20 LAB — COMPREHENSIVE METABOLIC PANEL
ALT: 15 U/L (ref 0–35)
AST: 14 U/L (ref 0–37)
Albumin: 4.4 g/dL (ref 3.5–5.2)
Alkaline Phosphatase: 49 U/L (ref 39–117)
BUN: 8 mg/dL (ref 6–23)
CO2: 33 mEq/L — ABNORMAL HIGH (ref 19–32)
Calcium: 9.2 mg/dL (ref 8.4–10.5)
Chloride: 101 mEq/L (ref 96–112)
Creatinine, Ser: 0.66 mg/dL (ref 0.40–1.20)
GFR: 89.04 mL/min (ref >60.00)
Glucose, Bld: 83 mg/dL (ref 70–99)
Potassium: 3.8 mEq/L (ref 3.5–5.1)
Sodium: 140 mEq/L (ref 135–145)
Total Bilirubin: 0.6 mg/dL (ref 0.2–1.2)
Total Protein: 7.3 g/dL (ref 6.0–8.3)

## 2021-05-20 LAB — POCT URINALYSIS DIP (CLINITEK)
Bilirubin, UA: NEGATIVE
Glucose, UA: NEGATIVE mg/dL
Ketones, POC UA: NEGATIVE mg/dL
Leukocytes, UA: NEGATIVE
Nitrite, UA: NEGATIVE
POC PROTEIN,UA: NEGATIVE
Spec Grav, UA: 1.01 (ref 1.010–1.025)
Urobilinogen, UA: 0.2 E.U./dL
pH, UA: 6 (ref 5.0–8.0)

## 2021-05-20 MED ORDER — TIZANIDINE HCL 2 MG PO TABS: 2.0000 mg | ORAL_TABLET | Freq: Four times a day (QID) | ORAL | 0 refills | Status: DC | PRN

## 2021-05-20 NOTE — Telephone Encounter (Signed)
Pt called in stating that the Tizanidine was sent to the wrong pharmacy. She needs this to go to [facility] on [address].  Please advise

## 2021-05-20 NOTE — Patient Instructions (Addendum)
Ms. Bartok -   Doristine Devoid to see you  Take tizanidine 2 - 4mg  every 6 hours as needed for pain. You can take this safely with tylenol - doses up to 1000mg  every 8 hours are okay. Given your GI history, I would prefer you avoid ibuprofen at this time.  Continue with physical therapy  I will call if labs are worrisome  Thank you  Rich     If you have lab work done today you will be contacted with your lab results within the next 2 weeks.  If you have not heard from [place] then please contact us. The fastest way to get your results is to register for My Chart.   IF you received an x-ray today, you will receive an invoice from [facility]. Please contact [facility] at [phone] with questions or concerns regarding your invoice.   IF you received labwork today, you will receive an invoice from [facility]. Please contact [facility] at [phone] with questions or concerns regarding your invoice.   Our billing staff will not be able to assist you with questions regarding bills from these companies.  You will be contacted with the lab results as soon as they are available. The fastest way to get your results is to activate your My Chart account. Instructions are located on the last page of this paperwork. If you have not heard from [place] regarding the results in 2 weeks, please contact this office.

## 2021-05-20 NOTE — Telephone Encounter (Signed)
Rx sent to the correct pharmacy  

## 2021-05-20 NOTE — Progress Notes (Signed)
Established Patient Office Visit  Subjective:  Patient ID: Janet Pierce, female    DOB: [birth year]  Age: 71 y.o. MRN: [id number]  CC:  Chief Complaint  Patient presents with   Hypertension    Patient states she has been having some back pain. Patient states she was was getting some elevated BP readings. She has been using heating pads and she is having some head pressure and feeling off    HPI Janet Pierce presents for HTN  Notes elevated readings over the weekend. Has been having intermittent back pain - no injury or trauma noted. Has been using heating pads and OTC analgesics with limited relief.  BP readings at home have been as high as 158/95.  Notes pain in lower left back and left hip posterior. Having a tough time sleeping due to pain.  Physical therapy and injections have helped in the past.  More acute pain has started as of last Thursday/Friday.   Consistent bowel movements without any acute changes re GI symptoms. No urinary symptoms.   Past rx for tizanidine - has not used.  Past Medical History:  Diagnosis Date   Allergy    Anemia    Anxiety    Arthritis    Cataract    GERD (gastroesophageal reflux disease)    Glaucoma    Hyperlipidemia    Hypertension    Osteoporosis    Retinal detachment    left eye prosthesis   Scoliosis     Past Surgical History:  Procedure Laterality Date   BUNIONECTOMY Left    CATARACT EXTRACTION Right    EYE SURGERY Right    glucoma   FINGER SURGERY Right    ring finger   LIPOMA EXCISION     hip   RETINAL DETACHMENT SURGERY Left    TONSILLECTOMY      Family History  Problem Relation Age of Onset   Lung cancer Mother    Hypertension Mother    Bone cancer Mother    Liver cancer Mother    Other Sister        brain tumor   Hypertension Brother    Diabetes Maternal Grandmother    Fibroids Daughter    Colon cancer Neg Hx    Colon polyps Neg Hx    Esophageal cancer Neg Hx    Rectal cancer Neg Hx    Stomach  cancer Neg Hx     Social History   Socioeconomic History   Marital status: Legally Separated    Spouse name: Not on file   Number of children: 2   Years of education: Not on file   Highest education level: Not on file  Occupational History   Occupation: Press photographer associate/retired    Comment: [employer]  Tobacco Use   Smoking status: Former    Packs/day: 0.25    Types: Cigarettes    Quit date: 11/23/2016    Years since quitting: 4.4   Smokeless tobacco: Never   Tobacco comments:    stopped by using nicorette gum. has completely stopped  Vaping Use   Vaping Use: Never used  Substance and Sexual Activity   Alcohol use: Yes    Alcohol/week: 0.0 standard drinks    Comment: occasionally   Drug use: No   Sexual activity: Not on file  Other Topics Concern   Not on file  Social History Narrative   Not on file   Social Determinants of Health   Financial Resource Strain: Low Risk    Difficulty  of Paying Living Expenses: Not hard at all  Food Insecurity: No Food Insecurity   Worried About [place] in the Last Year: Never true   [place] in the Last Year: Never true  Transportation Needs: No Transportation Needs   Lack of Transportation (Medical): No   Lack of Transportation (Non-Medical): No  Physical Activity: Insufficiently Active   Days of Exercise per Week: 2 days   Minutes of Exercise per Session: 60 min  Stress: No Stress Concern Present   Feeling of Stress : Not at all  Social Connections: Moderately Isolated   Frequency of Communication with Friends and Family: More than three times a week   Frequency of Social Gatherings with Friends and Family: More than three times a week   Attends Religious Services: Never   Marine scientist or Organizations: Yes   Attends Music therapist: More than 4 times per year   Marital Status: Separated  Intimate Partner Violence: Not At Risk   Fear of Current or Ex-Partner: No   Emotionally Abused: No    Physically Abused: No   Sexually Abused: No    Outpatient Medications Prior to Visit  Medication Sig Dispense Refill   amLODipine (NORVASC) 10 MG tablet TAKE 1 TABLET DAILY BEFORE BREAKFAST 90 tablet 3   atorvastatin (LIPITOR) 20 MG tablet TAKE 1 TABLET DAILY 90 tablet 3   brimonidine (ALPHAGAN P) 0.1 % SOLN Place 1 drop into the right eye in the morning and at bedtime.     cetirizine (ZYRTEC) 10 MG tablet Take 1 tablet (10 mg total) by mouth daily. 30 tablet 11   dorzolamide (TRUSOPT) 2 % ophthalmic solution INSTILL 1 DROP INTO BOTH EYES BID  1   LUMIGAN 0.01 % SOLN 1 drop at bedtime.     Multiple Vitamins-Minerals (MULTIVITAMIN GUMMIES WOMENS) CHEW Chew 2 Doses by mouth every morning.     pantoprazole (PROTONIX) 40 MG tablet TAKE 1 TABLET TWICE A DAY 60 tablet 3   sucralfate (CARAFATE) 1 g tablet Take 1 tablet (1 g total) by mouth 3 (three) times daily with meals. 90 tablet 0   timolol (TIMOPTIC) 0.5 % ophthalmic solution Place 1 drop into the right eye 2 (two) times daily.      Vitamin D, Ergocalciferol, (DRISDOL) 1.25 MG (50000 UNIT) CAPS capsule Take 1 capsule (50,000 Units total) by mouth every 7 (seven) days. 12 capsule 0   tiZANidine (ZANAFLEX) 4 MG tablet Take 1 tablet (4 mg total) by mouth every 8 (eight) hours as needed for muscle spasms. 30 tablet 0   guaiFENesin (MUCINEX) 600 MG 12 hr tablet Take 1 tablet (600 mg total) by mouth 2 (two) times daily. (Patient not taking: Reported on 05/06/2021) 30 tablet 1   simethicone (MYLICON) 751 MG chewable tablet Chew 125 mg by mouth every 6 (six) hours as needed for flatulence. (Patient not taking: Reported on 05/20/2021)     No facility-administered medications prior to visit.    No Known Allergies  ROS Review of Systems  Constitutional:  Positive for fatigue. Negative for activity change, appetite change, chills, diaphoresis, fever and unexpected weight change.  HENT: Negative.    Eyes: Negative.   Respiratory: Negative.     Cardiovascular: Negative.   Gastrointestinal: Negative.   Genitourinary: Negative.   Musculoskeletal:  Positive for back pain. Negative for arthralgias, gait problem, joint swelling, myalgias, neck pain and neck stiffness.  Skin: Negative.   Neurological: Negative.   Psychiatric/Behavioral: Negative.  All other systems reviewed and are negative.    Objective:    Physical Exam Vitals and nursing note reviewed.  Constitutional:      General: She is not in acute distress.    Appearance: Normal appearance. She is normal weight. She is not ill-appearing, toxic-appearing or diaphoretic.  Cardiovascular:     Rate and Rhythm: Normal rate and regular rhythm.     Heart sounds: Normal heart sounds. No murmur heard.   No friction rub. No gallop.  Pulmonary:     Effort: Pulmonary effort is normal. No respiratory distress.     Breath sounds: Normal breath sounds. No stridor. No wheezing, rhonchi or rales.  Chest:     Chest wall: No tenderness.  Musculoskeletal:        General: Tenderness (mild, lower left back) present.  Skin:    General: Skin is warm and dry.  Neurological:     General: No focal deficit present.     Mental Status: She is alert and oriented to person, place, and time. Mental status is at baseline.  Psychiatric:        Mood and Affect: Mood normal.        Behavior: Behavior normal.        Thought Content: Thought content normal.        Judgment: Judgment normal.    BP 121/65    Pulse 100    Temp 98.2 F (36.8 C) (Temporal)    Resp 18    Ht 5\' 3"  (1.6 m)    Wt 145 lb (65.8 kg)    SpO2 100%    BMI 25.69 kg/m  Wt Readings from Last 3 Encounters:  05/20/21 145 lb (65.8 kg)  05/06/21 147 lb 6.4 oz (66.9 kg)  04/12/21 145 lb 9.6 oz (66 kg)     Health Maintenance Due  Topic Date Due   Pneumonia Vaccine 28+ Years old (1 - PCV) Never done   TETANUS/TDAP  Never done   Zoster Vaccines- Shingrix (1 of 2) Never done    There are no preventive care reminders to  display for this patient.  Lab Results  Component Value Date   TSH 2.53 04/04/2021   Lab Results  Component Value Date   WBC 4.1 05/06/2021   HGB 12.1 05/06/2021   HCT 37.6 05/06/2021   MCV 84.7 05/06/2021   PLT 276.0 05/06/2021   Lab Results  Component Value Date   NA 140 04/04/2021   K 3.9 04/04/2021   CO2 31 04/04/2021   GLUCOSE 92 04/04/2021   BUN 12 04/04/2021   CREATININE 0.69 04/04/2021   BILITOT 0.6 04/04/2021   ALKPHOS 59 04/04/2021   AST 17 04/04/2021   ALT 20 04/04/2021   PROT 6.9 04/04/2021   ALBUMIN 4.1 04/04/2021   CALCIUM 9.5 04/04/2021   ANIONGAP 5 07/15/2016   GFR 88.17 04/04/2021   Lab Results  Component Value Date   CHOL 208 (H) 04/04/2021   Lab Results  Component Value Date   HDL 60.50 04/04/2021   Lab Results  Component Value Date   LDLCALC 135 (H) 04/04/2021   Lab Results  Component Value Date   TRIG 63.0 04/04/2021   Lab Results  Component Value Date   CHOLHDL 3 04/04/2021   Lab Results  Component Value Date   HGBA1C 5.9 08/08/2020      Assessment & Plan:   Problem List Items Addressed This Visit   None Visit Diagnoses     Flank pain    -  Primary   Relevant Orders   CBC with Differential/Platelet   Comprehensive metabolic panel   POCT URINALYSIS DIP (CLINITEK)   Chronic left-sided low back pain without sciatica       Relevant Medications   tiZANidine (ZANAFLEX) 2 MG tablet       Meds ordered this encounter  Medications   tiZANidine (ZANAFLEX) 2 MG tablet    Sig: Take 1 tablet (2 mg total) by mouth every 6 (six) hours as needed for muscle spasms.    Dispense:  30 tablet    Refill:  0    Order Specific Question:   Supervising Provider    Answer:   Carlota Raspberry, JEFFREY R [[id number]]    Follow-up: Return if symptoms worsen or fail to improve.   PLAN Suspect msk etiology. Labs to rule out other causes. Tizanidine 2mg  po q6h prn. Recommend she use tylenol 1000mg  po tid prn. Return if worsening or failing to improve.  Continue PT. Patient encouraged to call clinic with any questions, comments, or concerns.  Maximiano Coss, NP

## 2021-05-22 ENCOUNTER — Encounter: Payer: Self-pay | Admitting: Family Medicine

## 2021-05-22 ENCOUNTER — Ambulatory Visit: Payer: Medicare Other | Admitting: Physical Therapy

## 2021-05-29 ENCOUNTER — Other Ambulatory Visit: Payer: Self-pay

## 2021-05-29 ENCOUNTER — Encounter: Payer: Self-pay | Admitting: Physical Medicine and Rehabilitation

## 2021-05-29 ENCOUNTER — Ambulatory Visit: Payer: Medicare Other | Admitting: Physical Medicine and Rehabilitation

## 2021-05-29 ENCOUNTER — Ambulatory Visit: Payer: Medicare Other | Admitting: Physical Therapy

## 2021-05-29 VITALS — BP 109/76 | HR 89

## 2021-05-29 DIAGNOSIS — G8929 Other chronic pain: Secondary | ICD-10-CM

## 2021-05-29 DIAGNOSIS — M545 Low back pain, unspecified: Secondary | ICD-10-CM

## 2021-05-29 DIAGNOSIS — M48061 Spinal stenosis, lumbar region without neurogenic claudication: Secondary | ICD-10-CM | POA: Diagnosis not present

## 2021-05-29 DIAGNOSIS — M47816 Spondylosis without myelopathy or radiculopathy, lumbar region: Secondary | ICD-10-CM

## 2021-05-29 NOTE — Progress Notes (Signed)
Pt state lower back pain that travels to left hip. Pt state bending and PT makes the pain worse. Pt state she uses heat, ten unit and pain meds to help ease her pain.  Numeric Pain Rating Scale and Functional Assessment Average Pain 10 Pain Right Now 4 My pain is intermittent, sharp, stabbing, and aching Pain is worse with: bending and some activites Pain improves with: therapy/exercise, medication, and TENS   In the last MONTH (on 0-10 scale) has pain interfered with the following?  1. General activity like being  able to carry out your everyday physical activities such as walking, climbing stairs, carrying groceries, or moving a chair?  Rating(7)  2. Relation with others like being able to carry out your usual social activities and roles such as  activities at home, at work and in your community. Rating(8)  3. Enjoyment of life such that you have  been bothered by emotional problems such as feeling anxious, depressed or irritable?  Rating(9)

## 2021-05-29 NOTE — Progress Notes (Signed)
Janet Pierce - 71 y.o. female MRN [id number]  Date of birth: 11/09/1950  Office Visit Note: Visit Date: 05/29/2021 PCP: Midge Minium, MD Referred by: Midge Minium, MD  Subjective: Chief Complaint  Patient presents with   Lower Back - Pain   Left Hip - Pain   HPI: Janet Pierce is a 71 y.o. female who comes in today for evaluation of chronic, worsening and severe left-sided lower back pain radiating to buttock and hip.  Patient states pain has been ongoing for several years.  Patient states her pain is exacerbated by bending, movement and activity.  She describes her pain as a soreness sensation, and currently rates a 7 out of 10.  Patient reports some relief of pain with home exercises, back brace, rest and use of medications.  Patient recently attended formal physical therapy at [facility] and reports these treatments made her pain worse. Patients lumbar MRI from 2020 exhibits advanced bilateral facet degenerative changes that are worse on the left at L4-L5 and bilateral facet degenerative changes that are worse on the left at L5-S1. There is also marked left foraminal narrowing at L4-L5, exiting left L4 nerve root does appear compressed in the foramen. No high grade spinal canal stenosis noted. Patient had bilateral L4-L5 and L5-S1 facet joint injections in November of 2022 and reports greater than 50% pain relief with this procedure. Patient states her pain is much more severe at this point compared to past exacerbations. Patient currently denies any new injuries. Patient states she was recently seen by her primary care provider Dr. [name] for chronic left lower back pain and was prescribed Tizanidine which she reports helps to alleviate pain. Patient states she is an active person and does participate in a bowling league. Patient denies focal weakness, numbness and tingling. Patient denies recent trauma or falls.   Review of Systems   Musculoskeletal:  Positive for back pain.  Neurological:  Negative for tingling, sensory change, focal weakness and weakness.  All other systems reviewed and are negative. Otherwise per HPI.  Assessment & Plan: Visit Diagnoses:    ICD-10-CM   1. Spondylosis without myelopathy or radiculopathy, lumbar region  M47.816 Ambulatory referral to Physical Medicine Rehab    2. Chronic left-sided low back pain without sciatica  M54.50    G89.29     3. Facet arthropathy, lumbar  M47.816 Ambulatory referral to Physical Medicine Rehab    4. Foraminal stenosis of lumbar region  M48.061 Ambulatory referral to Physical Medicine Rehab       Plan: Findings:  Chronic, worsening and severe left-sided lower back pain radiating to buttock and hip.  Patient continues to have excruciating pain despite good conservative therapy such as formal physical therapy, home exercise regimen, use of back brace and medications.  Patient's clinical presentation and exam are consistent with facet mediated pain.  Patient does have advanced facet arthrosis on the left at L4-L5 and L5-S1. We believe the next step is to repeat left L4-L5 and L5-S1 facet/medial branch blocks under fluoroscopic guidance. This  would be the second set in a double block paradigm. If patient gets good relief with facet blocks we did discuss the possibility of longer sustained pain relief with radiofrequency ablation. Patient given educational material regarding radiofrequency ablation to take home and review.  Patient did voice that physical therapy seems to be exacerbating her pain.  We encouraged patient to attend formal physical therapy and to perform these exercises as tolerated, however  if she feels this is making her pain worse we can revisit PT treatments at a later time.  If patients pain persists, worsens or changes in nature we did discuss the possibility of obtaining new lumbar MRI imaging.  Patient encouraged to remain active and to continue with  PT and home exercises as tolerated.  No red flag symptoms noted upon exam today.   Meds & Orders: No orders of the defined types were placed in this encounter.   Orders Placed This Encounter  Procedures   Ambulatory referral to Physical Medicine Rehab    Follow-up: Return for Left L4-L5 and L5-S1 facet/medial branch blocks.   Procedures: No procedures performed      Clinical History: MRI LUMBAR SPINE WITHOUT CONTRAST   TECHNIQUE: Multiplanar, multisequence MR imaging of the lumbar spine was performed. No intravenous contrast was administered.   COMPARISON:  Plain films lumbar spine from [facility] : [facility] 11/02/2018.   FINDINGS: Segmentation:  Standard.   Alignment: Severe convex left scoliosis with the apex at T12-L1. Facet degenerative change results in 0.6 cm anterolisthesis L4 on L5 and trace anterolisthesis L5 on S1.   Vertebrae: No fracture or worrisome lesion. Degenerative endplate signal change at L4-5 is eccentric to the left.   Conus medullaris and cauda equina: Conus extends to the L2 level. Conus and cauda equina appear normal.   Paraspinal and other soft tissues: Negative.   Disc levels:   T12-L1: Right worse than left facet degenerative change. No disc bulge or protrusion. No stenosis.   L1-2: Bilateral facet degenerative disease appears worse on the right. Minimal disc bulge without stenosis.   L2-3: Facet arthropathy.  Otherwise negative.   L3-4: Mild-to-moderate facet degenerative change. Otherwise negative.   L4-5: Advanced bilateral facet degenerative disease is worse on the left. The disc is uncovered with a shallow bulge. There is mild central canal and left subarticular recess narrowing. The right foramen is open. Marked left foraminal narrowing is present. The exiting left L4 root appears compressed in the foramen   L5-S1: Slight disc uncovering. Bilateral facet degenerative change is worse on the left. The central canal and foramina  are widely patent.   IMPRESSION: Given left leg symptoms, dominant finding is at L4-5 where anterolisthesis, disc and facet arthropathy cause marked left foraminal narrowing. The exiting left L4 root appears compressed in the foramen.   Negative for central canal stenosis.   Severe convex left thoracolumbar scoliosis.     Electronically Signed   By: Inge Rise M.D.   On: 12/14/2018 15:59   She reports that she quit smoking about 4 years ago. Her smoking use included cigarettes. She smoked an average of .25 packs per day. She has never used smokeless tobacco.  Recent Labs    08/08/20 1311  HGBA1C 5.9    Objective:  VS:  HT:     WT:    BMI:      BP:109/76   HR:89bpm   TEMP: ( )   RESP:  Physical Exam Vitals and nursing note reviewed.  HENT:     Head: Normocephalic and atraumatic.     Right Ear: External ear normal.     Left Ear: External ear normal.     Nose: Nose normal.     Mouth/Throat:     Mouth: Mucous membranes are moist.  Eyes:     Extraocular Movements: Extraocular movements intact.  Cardiovascular:     Rate and Rhythm: Normal rate.     Pulses: Normal pulses.  Pulmonary:  Effort: Pulmonary effort is normal.  Abdominal:     General: Abdomen is flat. There is no distension.  Musculoskeletal:        General: Tenderness present.     Cervical back: Normal range of motion.     Comments: Pt is slow to rise from seated position to standing. Concordant low back pain with facet loading, lumbar spine extension and rotation. Strong distal strength without clonus, no pain upon palpation of greater trochanters. Sensation intact bilaterally. Walks independently, gait steady.    Skin:    General: Skin is warm and dry.     Capillary Refill: Capillary refill takes less than 2 seconds.  Neurological:     General: No focal deficit present.     Mental Status: She is alert and oriented to person, place, and time.  Psychiatric:        Mood and Affect: Mood normal.         Behavior: Behavior normal.    Ortho Exam  Imaging: No results found.  Past Medical/Family/Surgical/Social History: Medications & Allergies reviewed per EMR, new medications updated. Patient Active Problem List   Diagnosis Date Noted   Osteoporosis 09/28/2019   Vitamin D deficiency 07/08/2018   Benign paroxysmal positional vertigo 05/30/2015   Bunion, right foot 06/01/2014   Tobacco abuse 11/25/2013   Dysphagia 05/19/2013   Special screening for malignant neoplasms, colon 05/19/2013   GERD (gastroesophageal reflux disease) 04/04/2013   Allergic rhinitis 04/04/2013   General medical examination 09/11/2010   PREMATURE VENTRICULAR CONTRACTIONS 11/06/2009   SUPRAVENTRICULAR TACHYCARDIA 10/05/2009   LEUKOCYTOPENIA UNSPECIFIED 09/10/2009   DEPRESSIVE DISORDER 09/06/2009   ECZEMA 09/06/2009   PALPITATIONS, OCCASIONAL 06/08/2009   SHOULDER PAIN, LEFT 05/24/2009   BACK PAIN 05/24/2009   SCOLIOSIS 05/24/2009   LIPOMA 02/02/2008   Hyperlipidemia 02/02/2008   HYPERTENSION, BENIGN ESSENTIAL 02/02/2008   Past Medical History:  Diagnosis Date   Allergy    Anemia    Anxiety    Arthritis    Cataract    GERD (gastroesophageal reflux disease)    Glaucoma    Hyperlipidemia    Hypertension    Osteoporosis    Retinal detachment    left eye prosthesis   Scoliosis    Family History  Problem Relation Age of Onset   Lung cancer Mother    Hypertension Mother    Bone cancer Mother    Liver cancer Mother    Other Sister        brain tumor   Hypertension Brother    Diabetes Maternal Grandmother    Fibroids Daughter    Colon cancer Neg Hx    Colon polyps Neg Hx    Esophageal cancer Neg Hx    Rectal cancer Neg Hx    Stomach cancer Neg Hx    Past Surgical History:  Procedure Laterality Date   BUNIONECTOMY Left    CATARACT EXTRACTION Right    EYE SURGERY Right    glucoma   FINGER SURGERY Right    ring finger   LIPOMA EXCISION     hip   RETINAL DETACHMENT SURGERY Left     TONSILLECTOMY     Social History   Occupational History   Occupation: Press photographer associate/retired    Comment: [employer]  Tobacco Use   Smoking status: Former    Packs/day: 0.25    Types: Cigarettes    Quit date: 11/23/2016    Years since quitting: 4.5   Smokeless tobacco: Never   Tobacco comments:  stopped by using nicorette gum. has completely stopped  Vaping Use   Vaping Use: Never used  Substance and Sexual Activity   Alcohol use: Yes    Alcohol/week: 0.0 standard drinks    Comment: occasionally   Drug use: No   Sexual activity: Not on file

## 2021-06-05 ENCOUNTER — Ambulatory Visit: Payer: Medicare Other | Admitting: Physical Therapy

## 2021-06-12 ENCOUNTER — Ambulatory Visit: Payer: Self-pay

## 2021-06-12 ENCOUNTER — Ambulatory Visit: Payer: Medicare Other | Admitting: Physical Therapy

## 2021-06-12 ENCOUNTER — Encounter: Payer: Self-pay | Admitting: Physical Medicine and Rehabilitation

## 2021-06-12 ENCOUNTER — Other Ambulatory Visit: Payer: Self-pay

## 2021-06-12 ENCOUNTER — Ambulatory Visit (INDEPENDENT_AMBULATORY_CARE_PROVIDER_SITE_OTHER): Payer: Medicare Other | Admitting: Physical Medicine and Rehabilitation

## 2021-06-12 VITALS — BP 124/76 | HR 89

## 2021-06-12 DIAGNOSIS — M47816 Spondylosis without myelopathy or radiculopathy, lumbar region: Secondary | ICD-10-CM

## 2021-06-12 MED ORDER — METHYLPREDNISOLONE ACETATE 80 MG/ML IJ SUSP
80.0000 mg | Freq: Once | INTRAMUSCULAR | Status: AC
  Administered 2021-06-12: 80 mg

## 2021-06-12 NOTE — Progress Notes (Signed)
Pt state lower back pain that travels to left hip. Pt state bending and PT makes the pain worse. Pt state she uses heat, ten unit and pain meds to help ease her pain.  Numeric Pain Rating Scale and Functional Assessment Average Pain 2   In the last MONTH (on 0-10 scale) has pain interfered with the following?  1. General activity like being  able to carry out your everyday physical activities such as walking, climbing stairs, carrying groceries, or moving a chair?  Rating(9)   +Driver, -BT, -Dye Allergies.

## 2021-06-12 NOTE — Patient Instructions (Signed)

## 2021-06-30 NOTE — Procedures (Signed)
Lumbar Facet Joint Intra-Articular Injection(s) with Fluoroscopic Guidance  [name]      Date of Birth: [birth year] MRN: [id number] PCP: Midge Minium, MD      Visit Date: 06/12/2021   Universal Protocol:    Date/Time: 06/12/2021  Consent Given By: the patient  Position: PRONE   Additional Comments: Vital signs were monitored before and after the procedure. Patient was prepped and draped in the usual sterile fashion. The correct patient, procedure, and site was verified.   Injection Procedure Details:  Procedure Site One Meds Administered:  Meds ordered this encounter  Medications   methylPREDNISolone acetate (DEPO-MEDROL) injection 80 mg     Laterality: Left  Location/Site:  L4-L5 L5-S1  Needle size: 22 guage  Needle type: Spinal  Needle Placement: Articular  Findings:  -Comments: Excellent flow of contrast producing a partial arthrogram.  Procedure Details: The fluoroscope beam is vertically oriented in AP, and the inferior recess is visualized beneath the lower pole of the inferior apophyseal process, which represents the target point for needle insertion. When direct visualization is difficult the target point is located at the medial projection of the vertebral pedicle. The region overlying each aforementioned target is locally anesthetized with a 1 to 2 ml. volume of 1% Lidocaine without Epinephrine.   The spinal needle was inserted into each of the above mentioned facet joints using biplanar fluoroscopic guidance. A 0.25 to 0.5 ml. volume of Isovue-250 was injected and a partial facet joint arthrogram was obtained. A single spot film was obtained of the resulting arthrogram.    One to 1.25 ml of the steroid/anesthetic solution was then injected into each of the facet joints noted above.   Additional Comments:  The patient tolerated the procedure well Dressing: 2 x 2 sterile gauze and Band-Aid    Post-procedure details: Patient was  observed during the procedure. Post-procedure instructions were reviewed.  Patient left the clinic in stable condition.

## 2021-06-30 NOTE — Progress Notes (Signed)
Janet Pierce - 71 y.o. female MRN [id number]  Date of birth: 08/27/1950  Office Visit Note: Visit Date: 06/12/2021 PCP: Midge Minium, MD Referred by: Midge Minium, MD  Subjective: Chief Complaint  Patient presents with   Lower Back - Pain   Left Hip - Pain   HPI:  Janet Pierce is a 71 y.o. female who comes in today at the request of Barnet Pall, FNP for planned Left  L4-5 and L5-S1 Lumbar facet/medial branch block with fluoroscopic guidance.  The patient has failed conservative care including home exercise, medications, time and activity modification.  This injection will be diagnostic and hopefully therapeutic.  Please see requesting physician notes for further details and justification.  Exam has shown concordant pain with facet joint loading.   ROS Otherwise per HPI.  Assessment & Plan: Visit Diagnoses:    ICD-10-CM   1. Spondylosis without myelopathy or radiculopathy, lumbar region  M47.816 XR C-ARM NO REPORT    Facet Injection    methylPREDNISolone acetate (DEPO-MEDROL) injection 80 mg      Plan: No additional findings.   Meds & Orders:  Meds ordered this encounter  Medications   methylPREDNISolone acetate (DEPO-MEDROL) injection 80 mg    Orders Placed This Encounter  Procedures   Facet Injection   XR C-ARM NO REPORT    Follow-up: Return if symptoms worsen or fail to improve.   Procedures: No procedures performed  Lumbar Facet Joint Intra-Articular Injection(s) with Fluoroscopic Guidance  [name]      Date of Birth: 06-11-1950 MRN: [id number] PCP: Midge Minium, MD      Visit Date: 06/12/2021   Universal Protocol:    Date/Time: 06/12/2021  Consent Given By: the patient  Position: PRONE   Additional Comments: Vital signs were monitored before and after the procedure. Patient was prepped and draped in the usual sterile fashion. The correct patient, procedure, and site was verified.   Injection Procedure Details:   Procedure Site One Meds Administered:  Meds ordered this encounter  Medications   methylPREDNISolone acetate (DEPO-MEDROL) injection 80 mg     Laterality: Left  Location/Site:  L4-L5 L5-S1  Needle size: 22 guage  Needle type: Spinal  Needle Placement: Articular  Findings:  -Comments: Excellent flow of contrast producing a partial arthrogram.  Procedure Details: The fluoroscope beam is vertically oriented in AP, and the inferior recess is visualized beneath the lower pole of the inferior apophyseal process, which represents the target point for needle insertion. When direct visualization is difficult the target point is located at the medial projection of the vertebral pedicle. The region overlying each aforementioned target is locally anesthetized with a 1 to 2 ml. volume of 1% Lidocaine without Epinephrine.   The spinal needle was inserted into each of the above mentioned facet joints using biplanar fluoroscopic guidance. A 0.25 to 0.5 ml. volume of Isovue-250 was injected and a partial facet joint arthrogram was obtained. A single spot film was obtained of the resulting arthrogram.    One to 1.25 ml of the steroid/anesthetic solution was then injected into each of the facet joints noted above.   Additional Comments:  The patient tolerated the procedure well Dressing: 2 x 2 sterile gauze and Band-Aid    Post-procedure details: Patient was observed during the procedure. Post-procedure instructions were reviewed.  Patient left the clinic in stable condition.    Clinical History: No specialty comments available.     Objective:  VS:  HT:  WT:    BMI:      BP:124/76   HR:89bpm   TEMP: ( )   RESP:  Physical Exam Vitals and nursing note reviewed.  Constitutional:      General: She is not in acute distress.    Appearance: Normal appearance. She is not ill-appearing.  HENT:     Head: Normocephalic and atraumatic.     Right Ear: External ear normal.     Left Ear:  External ear normal.  Eyes:     Extraocular Movements: Extraocular movements intact.  Cardiovascular:     Rate and Rhythm: Normal rate.     Pulses: Normal pulses.  Pulmonary:     Effort: Pulmonary effort is normal. No respiratory distress.  Abdominal:     General: There is no distension.     Palpations: Abdomen is soft.  Musculoskeletal:        General: Tenderness present.     Cervical back: Neck supple.     Right lower leg: No edema.     Left lower leg: No edema.     Comments: Patient has good distal strength with no pain over the greater trochanters.  No clonus or focal weakness. Patient somewhat slow to rise from a seated position to full extension.  There is concordant low back pain with facet loading and lumbar spine extension rotation.  There are no definitive trigger points but the patient is somewhat tender across the lower back and PSIS.  There is no pain with hip rotation.   Skin:    Findings: No erythema, lesion or rash.  Neurological:     General: No focal deficit present.     Mental Status: She is alert and oriented to person, place, and time.     Sensory: No sensory deficit.     Motor: No weakness or abnormal muscle tone.     Coordination: Coordination normal.  Psychiatric:        Mood and Affect: Mood normal.        Behavior: Behavior normal.     Imaging: No results found.

## 2021-07-12 ENCOUNTER — Telehealth: Payer: Self-pay | Admitting: Physical Medicine and Rehabilitation

## 2021-07-12 NOTE — Telephone Encounter (Signed)
Pt called to set an appt. Please call pt at [phone] ?

## 2021-07-15 ENCOUNTER — Encounter: Payer: Self-pay | Admitting: Family Medicine

## 2021-07-15 ENCOUNTER — Telehealth: Payer: Self-pay | Admitting: Physical Medicine and Rehabilitation

## 2021-07-15 ENCOUNTER — Ambulatory Visit: Payer: Medicare Other | Admitting: Family Medicine

## 2021-07-15 VITALS — BP 120/70 | HR 82 | Temp 98.6°F | Resp 17 | Wt 143.6 lb

## 2021-07-15 DIAGNOSIS — J0110 Acute frontal sinusitis, unspecified: Secondary | ICD-10-CM | POA: Diagnosis not present

## 2021-07-15 DIAGNOSIS — M62838 Other muscle spasm: Secondary | ICD-10-CM | POA: Diagnosis not present

## 2021-07-15 MED ORDER — PREDNISONE 10 MG PO TABS: ORAL_TABLET | ORAL | 0 refills | Status: DC

## 2021-07-15 NOTE — Progress Notes (Signed)
? ?  Subjective:  ? ? Patient ID: Janet Pierce, female    DOB: March 15, 1951, 71 y.o.   MRN: [id number] ? ?HPI ?Back pain- pt is seeing Dr Ernestina Patches for back pain.  Pt is not sleeping well at night.  Taking a muscle relaxer the last few days.  Pain worsens w/ prolonged sitting.  Pt reports she will wake each morning w/ tension in shoulders and neck.  Will use a warm towel during the day for pain relief and spasm.   ? ?HA- described as a pressure.  + facial pressure.  Some ear fullness.  Sxs started ~10 days ago and would last all day.  Pt reports that yesterday and today were the first days she has not had HA and facial pain/pressure.  Denies nasal congestion.  Not taking Allergy medication. ? ? ?Review of Systems ?For ROS see HPI  ? ?This visit occurred during the SARS-CoV-2 public health emergency.  Safety protocols were in place, including screening questions prior to the visit, additional usage of staff PPE, and extensive cleaning of exam room while observing appropriate contact time as indicated for disinfecting solutions.   ?   ?Objective:  ? Physical Exam ?Vitals reviewed.  ?Constitutional:   ?   General: She is not in acute distress. ?   Appearance: Normal appearance. She is not ill-appearing.  ?HENT:  ?   Head: Normocephalic and atraumatic.  ?   Right Ear: Tympanic membrane normal.  ?   Left Ear: Tympanic membrane normal.  ?   Nose:  ?   Comments: No TTP over frontal or maxillary sinuses ?Neck:  ?   Comments: Bilateral trap spasm ?Musculoskeletal:     ?   General: Tenderness (TTP over traps bilaterally) present.  ?Lymphadenopathy:  ?   Cervical: No cervical adenopathy.  ?Skin: ?   General: Skin is warm and dry.  ?Neurological:  ?   Mental Status: She is alert and oriented to person, place, and time. Mental status is at baseline.  ?   Motor: No weakness.  ?   Coordination: Coordination normal.  ?Psychiatric:  ?   Comments: anxious  ? ? ? ? ? ?   ?Assessment & Plan:  ? ?Trap spasm- new.  Bilateral.  Likely due to the  ongoing pain she has in her lower back.  Worse in the morning upon waking.  Improved the last few nights when taking Tizanidine.  She is hoping to see Dr Ernestina Patches this week to address the LBP.  Encouraged her to take the Tizanidine as needed.  Will start Prednisone taper to improve both trap spasm/pain and also sinus inflammation.  Pt expressed understanding and is in agreement w/ plan.  ? ?Sinusitis- new.  Pt reports yesterday and today sxs have resolved.  Based on her description, it sounds as if she had sinus inflammation due to untreated seasonal allergies.  Encouraged her to restart Cetirizine '10mg'$  daily.  The prednisone for her trap spasms should also improve sinus inflammation.  Pt expressed understanding and is in agreement w/ plan.  ?

## 2021-07-15 NOTE — Telephone Encounter (Signed)
Patient called needing to schedule an appointment with Dr. Ernestina Patches for her back.  Ph# [phone] ?

## 2021-07-15 NOTE — Patient Instructions (Addendum)
Follow up as needed or as scheduled ?Your neck and shoulder pain is due to muscle spasm.  Make sure you are taking the Tizanidine nightly ?HEAT or ICE- whichever feels better ?START the Prednisone as directed- take w/ food ?RESTART the Cetirizine (Zyrtec) for seasonal allergies ?Continue to drink lots of water ?Call with any questions or concerns ?Hang in there!! ?

## 2021-07-16 ENCOUNTER — Other Ambulatory Visit: Payer: Self-pay | Admitting: Physical Medicine and Rehabilitation

## 2021-07-16 DIAGNOSIS — M47816 Spondylosis without myelopathy or radiculopathy, lumbar region: Secondary | ICD-10-CM

## 2021-07-16 DIAGNOSIS — M545 Low back pain, unspecified: Secondary | ICD-10-CM

## 2021-07-16 NOTE — Progress Notes (Signed)
Spoke with patient via telephone this morning, she reports greater than 50% relief from facet joint blocks on 06/12/2021 and 03/12/2022. She would like to proceed with radiofrequency ablation. Patient also requesting referral to neurosurgeon to discuss other possible treatment options.  ?

## 2021-07-25 ENCOUNTER — Other Ambulatory Visit: Payer: Self-pay

## 2021-07-25 MED ORDER — VITAMIN D (ERGOCALCIFEROL) 1.25 MG (50000 UNIT) PO CAPS: 50000.0000 [IU] | ORAL_CAPSULE | ORAL | 0 refills | Status: DC

## 2021-07-31 ENCOUNTER — Other Ambulatory Visit: Payer: Self-pay

## 2021-07-31 ENCOUNTER — Encounter: Payer: Self-pay | Admitting: Registered Nurse

## 2021-07-31 ENCOUNTER — Ambulatory Visit (INDEPENDENT_AMBULATORY_CARE_PROVIDER_SITE_OTHER): Payer: Medicare Other | Admitting: Registered Nurse

## 2021-07-31 VITALS — BP 105/58 | HR 90 | Temp 98.1°F | Resp 18 | Ht 63.0 in | Wt 143.6 lb

## 2021-07-31 DIAGNOSIS — R310 Gross hematuria: Secondary | ICD-10-CM | POA: Diagnosis not present

## 2021-07-31 DIAGNOSIS — R350 Frequency of micturition: Secondary | ICD-10-CM | POA: Diagnosis not present

## 2021-07-31 MED ORDER — SULFAMETHOXAZOLE-TRIMETHOPRIM 800-160 MG PO TABS: 1.0000 | ORAL_TABLET | Freq: Two times a day (BID) | ORAL | 0 refills | Status: DC

## 2021-07-31 NOTE — Patient Instructions (Addendum)
[name] -  ? ?Great to see you  ? ?Take bactrim twice daily for three days ? ?Call if you need anything ? ?Thanks, ? ?Rich  ? ? ? ?If you have lab work done today you will be contacted with your lab results within the next 2 weeks.  If you have not heard from [place] then please contact us. The fastest way to get your results is to register for My Chart. ? ? ?IF you received an x-ray today, you will receive an invoice from [facility]. Please contact [facility] at [phone] with questions or concerns regarding your invoice.  ? ?IF you received labwork today, you will receive an invoice from [facility]. Please contact [facility] at [phone] with questions or concerns regarding your invoice.  ? ?Our billing staff will not be able to assist you with questions regarding bills from these companies. ? ?You will be contacted with the lab results as soon as they are available. The fastest way to get your results is to activate your My Chart account. Instructions are located on the last page of this paperwork. If you have not heard from [place] regarding the results in 2 weeks, please contact this office. ?  ? ? ?

## 2021-07-31 NOTE — Progress Notes (Signed)
? ?Acute Office Visit ? ?Subjective:  ? ? Patient ID: Janet Pierce, female    DOB: Dec 09, 1950, 71 y.o.   MRN: [id number] ? ?Chief Complaint  ?Patient presents with  ? Urinary Tract Infection  ?  Patient states she has been having some frequent urination and pressure for a couple of days .  ? ? ?HPI ?Patient is in today for suspected UTI ? ?Frequent urination and suprapubic pressure ?Onset 3 days ago ?Some gross hematuria ?Similar to previous UTI ?No fevers, chills, fatigue, sweats. ? ?No other acute concerns.  ? ?Outpatient Medications Prior to Visit  ?Medication Sig Dispense Refill  ? amLODipine (NORVASC) 10 MG tablet TAKE 1 TABLET DAILY BEFORE BREAKFAST 90 tablet 3  ? atorvastatin (LIPITOR) 20 MG tablet TAKE 1 TABLET DAILY 90 tablet 3  ? brimonidine (ALPHAGAN P) 0.1 % SOLN Place 1 drop into the right eye in the morning and at bedtime.    ? cetirizine (ZYRTEC) 10 MG tablet Take 1 tablet (10 mg total) by mouth daily. 30 tablet 11  ? dorzolamide (TRUSOPT) 2 % ophthalmic solution INSTILL 1 DROP INTO BOTH EYES BID  1  ? guaiFENesin (MUCINEX) 600 MG 12 hr tablet Take 1 tablet (600 mg total) by mouth 2 (two) times daily. 30 tablet 1  ? LUMIGAN 0.01 % SOLN 1 drop at bedtime.    ? Multiple Vitamins-Minerals (MULTIVITAMIN GUMMIES WOMENS) CHEW Chew 2 Doses by mouth every morning.    ? pantoprazole (PROTONIX) 40 MG tablet TAKE 1 TABLET TWICE A DAY 60 tablet 3  ? predniSONE (DELTASONE) 10 MG tablet 3 tabs x3 days and then 2 tabs x3 days and then 1 tab x3 days.  Take w/ food. 18 tablet 0  ? simethicone (MYLICON) 400 MG chewable tablet Chew 125 mg by mouth every 6 (six) hours as needed for flatulence.    ? sucralfate (CARAFATE) 1 g tablet Take 1 tablet (1 g total) by mouth 3 (three) times daily with meals. 90 tablet 0  ? timolol (TIMOPTIC) 0.5 % ophthalmic solution Place 1 drop into the right eye 2 (two) times daily.     ? tiZANidine (ZANAFLEX) 2 MG tablet Take 1 tablet (2 mg total) by mouth every 6 (six) hours as needed for  muscle spasms. 30 tablet 0  ? Vitamin D, Ergocalciferol, (DRISDOL) 1.25 MG (50000 UNIT) CAPS capsule Take 1 capsule (50,000 Units total) by mouth every 7 (seven) days. 12 capsule 0  ? ?No facility-administered medications prior to visit.  ? ? ?Review of Systems  ?Constitutional: Negative.   ?HENT: Negative.    ?Eyes: Negative.   ?Respiratory: Negative.    ?Cardiovascular: Negative.   ?Gastrointestinal: Negative.   ?Genitourinary:  Positive for pelvic pain and urgency.  ?Musculoskeletal: Negative.   ?Skin: Negative.   ?Neurological: Negative.   ?Psychiatric/Behavioral: Negative.    ? ?   ?Objective:  ?  ?BP (!) 105/58   Pulse 90   Temp 98.1 ?F (36.7 ?C) (Temporal)   Resp 18   Ht '5\' 3"'$  (1.6 m)   Wt 143 lb 9.6 oz (65.1 kg)   SpO2 99%   BMI 25.44 kg/m?  ?Physical Exam ?Vitals and nursing note reviewed.  ?Constitutional:   ?   General: She is not in acute distress. ?   Appearance: Normal appearance. She is normal weight. She is not ill-appearing, toxic-appearing or diaphoretic.  ?Cardiovascular:  ?   Rate and Rhythm: Normal rate and regular rhythm.  ?   Heart sounds: Normal heart sounds.  No murmur heard. ?  No friction rub. No gallop.  ?Pulmonary:  ?   Effort: Pulmonary effort is normal. No respiratory distress.  ?   Breath sounds: Normal breath sounds. No stridor. No wheezing, rhonchi or rales.  ?Chest:  ?   Chest wall: No tenderness.  ?Skin: ?   General: Skin is warm and dry.  ?Neurological:  ?   General: No focal deficit present.  ?   Mental Status: She is alert and oriented to person, place, and time. Mental status is at baseline.  ?Psychiatric:     ?   Mood and Affect: Mood normal.     ?   Behavior: Behavior normal.     ?   Thought Content: Thought content normal.     ?   Judgment: Judgment normal.  ? ? ?No results found for any visits on 07/31/21. ? ? ?   ?Assessment & Plan:  ?1. Frequent urination ?- Ambulatory referral to Urology ?- Urine Culture ?- sulfamethoxazole-trimethoprim (BACTRIM DS) 800-160 MG  tablet; Take 1 tablet by mouth 2 (two) times daily.  Dispense: 6 tablet; Refill: 0 ?- Urinalysis, Routine w reflex microscopic; Future ? ?2. Gross hematuria ?- Ambulatory referral to Urology ?- Urine Culture ?- sulfamethoxazole-trimethoprim (BACTRIM DS) 800-160 MG tablet; Take 1 tablet by mouth 2 (two) times daily.  Dispense: 6 tablet; Refill: 0 ?- Urinalysis, Routine w reflex microscopic; Future ? ? ? ?Meds ordered this encounter  ?Medications  ? sulfamethoxazole-trimethoprim (BACTRIM DS) 800-160 MG tablet  ?  Sig: Take 1 tablet by mouth 2 (two) times daily.  ?  Dispense:  6 tablet  ?  Refill:  0  ?  Order Specific Question:   Supervising Provider  ?  Answer:   Carlota Raspberry, JEFFREY R [[id number]]  ? ? ?Return in about 1 week (around 08/07/2021) for nurse visit - lab only. ? ?PLAN ?Suspect uti. Will send culture, treat presumptively with bactrim ?Refer to urology as this is second incidence of gross hematuria within past 3 mo. ?Repeat urinalysis in one week  ?Patient encouraged to call clinic with any questions, comments, or concerns. ? ? ?Maximiano Coss, NP ?

## 2021-08-02 LAB — URINE CULTURE
MICRO NUMBER:: 13257120
Result:: NO GROWTH
SPECIMEN QUALITY:: ADEQUATE

## 2021-08-07 ENCOUNTER — Ambulatory Visit: Payer: Medicare Other

## 2021-08-11 ENCOUNTER — Other Ambulatory Visit: Payer: Self-pay

## 2021-08-11 ENCOUNTER — Encounter (HOSPITAL_COMMUNITY): Payer: Self-pay | Admitting: Emergency Medicine

## 2021-08-11 ENCOUNTER — Emergency Department (HOSPITAL_COMMUNITY)
Admission: EM | Admit: 2021-08-11 | Discharge: 2021-08-11 | Disposition: A | Discharge status: Home / Self Care | Payer: Medicare Other | Attending: Emergency Medicine | Admitting: Emergency Medicine

## 2021-08-11 ENCOUNTER — Emergency Department (HOSPITAL_COMMUNITY): Payer: Medicare Other

## 2021-08-11 DIAGNOSIS — Z79899 Other long term (current) drug therapy: Secondary | ICD-10-CM | POA: Diagnosis not present

## 2021-08-11 DIAGNOSIS — R319 Hematuria, unspecified: Secondary | ICD-10-CM | POA: Diagnosis not present

## 2021-08-11 DIAGNOSIS — R140 Abdominal distension (gaseous): Secondary | ICD-10-CM | POA: Diagnosis not present

## 2021-08-11 DIAGNOSIS — R1084 Generalized abdominal pain: Secondary | ICD-10-CM | POA: Insufficient documentation

## 2021-08-11 DIAGNOSIS — R109 Unspecified abdominal pain: Secondary | ICD-10-CM

## 2021-08-11 LAB — CBC WITH DIFFERENTIAL/PLATELET
Abs Immature Granulocytes: 0.01 10*3/uL (ref 0.00–0.07)
Basophils Absolute: 0 10*3/uL (ref 0.0–0.1)
Basophils Relative: 1 %
Eosinophils Absolute: 0.1 10*3/uL (ref 0.0–0.5)
Eosinophils Relative: 2 %
HCT: 37.9 % (ref 36.0–46.0)
Hemoglobin: 12.5 g/dL (ref 12.0–15.0)
Immature Granulocytes: 0 %
Lymphocytes Relative: 31 %
Lymphs Abs: 1.7 10*3/uL (ref 0.7–4.0)
MCH: 28.4 pg (ref 26.0–34.0)
MCHC: 33 g/dL (ref 30.0–36.0)
MCV: 86.1 fL (ref 80.0–100.0)
Monocytes Absolute: 0.5 10*3/uL (ref 0.1–1.0)
Monocytes Relative: 9 %
Neutro Abs: 3.2 10*3/uL (ref 1.7–7.7)
Neutrophils Relative %: 57 %
Platelets: 274 10*3/uL (ref 150–400)
RBC: 4.4 MIL/uL (ref 3.87–5.11)
RDW: 13.2 % (ref 11.5–15.5)
WBC: 5.6 10*3/uL (ref 4.0–10.5)
nRBC: 0 % (ref 0.0–0.2)

## 2021-08-11 LAB — COMPREHENSIVE METABOLIC PANEL
ALT: 29 U/L (ref 0–44)
AST: 21 U/L (ref 15–41)
Albumin: 4.1 g/dL (ref 3.5–5.0)
Alkaline Phosphatase: 53 U/L (ref 38–126)
Anion gap: 7 (ref 5–15)
BUN: 13 mg/dL (ref 8–23)
CO2: 27 mmol/L (ref 22–32)
Calcium: 9 mg/dL (ref 8.9–10.3)
Chloride: 106 mmol/L (ref 98–111)
Creatinine, Ser: 0.76 mg/dL (ref 0.44–1.00)
GFR, Estimated: >60 mL/min (ref >60)
Glucose, Bld: 94 mg/dL (ref 70–99)
Potassium: 4 mmol/L (ref 3.5–5.1)
Sodium: 140 mmol/L (ref 135–145)
Total Bilirubin: 0.8 mg/dL (ref 0.3–1.2)
Total Protein: 7.5 g/dL (ref 6.5–8.1)

## 2021-08-11 LAB — URINALYSIS, ROUTINE W REFLEX MICROSCOPIC
Bilirubin Urine: NEGATIVE
Glucose, UA: NEGATIVE mg/dL
Ketones, ur: 5 mg/dL — AB
Leukocytes,Ua: NEGATIVE
Nitrite: NEGATIVE
Protein, ur: NEGATIVE mg/dL
Specific Gravity, Urine: 1.02 (ref 1.005–1.030)
pH: 6 (ref 5.0–8.0)

## 2021-08-11 LAB — LIPASE, BLOOD: Lipase: 36 U/L (ref 11–51)

## 2021-08-11 MED ORDER — SODIUM CHLORIDE (PF) 0.9 % IJ SOLN
INTRAMUSCULAR | Status: AC
  Filled 2021-08-11: qty 50

## 2021-08-11 MED ORDER — IOHEXOL 300 MG/ML  SOLN
100.0000 mL | Freq: Once | INTRAMUSCULAR | Status: AC | PRN
  Administered 2021-08-11: 100 mL via INTRAVENOUS

## 2021-08-11 NOTE — ED Provider Notes (Signed)
?[facility] ?Provider Note ? ? ?CSN: [id number] ?Arrival date & time: 08/11/21  1442 ? ?  ? ?History ? ?Chief Complaint  ?Patient presents with  ? Abdominal Pain  ? Flank Pain  ? ? ?Janet Pierce is a 71 y.o. female. ? ? ?Abdominal Pain ?Associated symptoms: hematuria   ?Associated symptoms: no shortness of breath   ?Flank Pain ?Associated symptoms include abdominal pain. Pertinent negatives include no shortness of breath.  ?Patient presents abdominal pain.  Somewhat diffuse.  Has had over the last few days but is had some hematuria for longer than that.  Had been seen by PCP and had hematuria.  Culture was sent and was negative however patient had been treated with Bactrim prior to this.  Has urology follow-up in 3 days.  Developed more pain today.  Some lower abdominal pain but also somewhat diffuse.  Has been on Carafate.  Also is on Protonix.  No fevers or chills.  Not associated with eating.  Has had somewhat issues for months.  No fevers.  No other bleeding.  She is due to have a ablation for a pinched nerve in her back. ?  ? ?Home Medications ?Prior to Admission medications   ?Medication Sig Start Date End Date Taking? Authorizing Provider  ?amLODipine (NORVASC) 10 MG tablet TAKE 1 TABLET DAILY BEFORE BREAKFAST 12/17/20   Midge Minium, MD  ?atorvastatin (LIPITOR) 20 MG tablet TAKE 1 TABLET DAILY 07/11/20   Midge Minium, MD  ?brimonidine (ALPHAGAN P) 0.1 % SOLN Place 1 drop into the right eye in the morning and at bedtime.    [provider]  ?cetirizine (ZYRTEC) 10 MG tablet Take 1 tablet (10 mg total) by mouth daily. 04/12/21   Midge Minium, MD  ?dorzolamide (TRUSOPT) 2 % ophthalmic solution INSTILL 1 DROP INTO BOTH EYES BID 05/08/17   [provider]  ?guaiFENesin (MUCINEX) 600 MG 12 hr tablet Take 1 tablet (600 mg total) by mouth 2 (two) times daily. 04/12/21   Midge Minium, MD  ?LUMIGAN 0.01 % SOLN 1 drop at bedtime. 03/13/20    [provider]  ?Multiple Vitamins-Minerals (MULTIVITAMIN GUMMIES WOMENS) CHEW Chew 2 Doses by mouth every morning.    [provider]  ?pantoprazole (PROTONIX) 40 MG tablet TAKE 1 TABLET TWICE A DAY 11/21/20   Midge Minium, MD  ?predniSONE (DELTASONE) 10 MG tablet 3 tabs x3 days and then 2 tabs x3 days and then 1 tab x3 days.  Take w/ food. 07/15/21   Midge Minium, MD  ?simethicone (MYLICON) 381 MG chewable tablet Chew 125 mg by mouth every 6 (six) hours as needed for flatulence.    [provider]  ?sucralfate (CARAFATE) 1 g tablet Take 1 tablet (1 g total) by mouth 3 (three) times daily with meals. 04/09/21   Midge Minium, MD  ?sulfamethoxazole-trimethoprim (BACTRIM DS) 800-160 MG tablet Take 1 tablet by mouth 2 (two) times daily. 07/31/21   Maximiano Coss, NP  ?timolol (TIMOPTIC) 0.5 % ophthalmic solution Place 1 drop into the right eye 2 (two) times daily.  09/13/13   [provider]  ?tiZANidine (ZANAFLEX) 2 MG tablet Take 1 tablet (2 mg total) by mouth every 6 (six) hours as needed for muscle spasms. 05/20/21   Midge Minium, MD  ?Vitamin D, Ergocalciferol, (DRISDOL) 1.25 MG (50000 UNIT) CAPS capsule Take 1 capsule (50,000 Units total) by mouth every 7 (seven) days. 07/25/21   Midge Minium, MD  ?   ? ?  Allergies    ?Patient has no known allergies.   ? ?Review of Systems   ?Review of Systems  ?Constitutional:  Negative for appetite change.  ?Respiratory:  Negative for shortness of breath.   ?Gastrointestinal:  Positive for abdominal pain.  ?Genitourinary:  Positive for flank pain and hematuria.  ?Musculoskeletal:  Positive for back pain.  ?Skin:  Negative for rash.  ?Neurological:  Negative for weakness.  ? ?Physical Exam ?Updated Vital Signs ?BP (!) 143/83 (BP Location: Right Arm)   Pulse 91   Temp 98.9 ?F (37.2 ?C) (Oral)   Resp 16   SpO2 99%  ?Physical Exam ?Vitals and nursing note reviewed.  ?HENT:  ?   Head: Atraumatic.  ?Cardiovascular:   ?   Rate and Rhythm: Normal rate.  ?Pulmonary:  ?   Breath sounds: Normal breath sounds.  ?Abdominal:  ?   Comments: Mild abdominal distention.  No rebound or guarding.  No hernia palpated.  ?Skin: ?   General: Skin is warm.  ?   Capillary Refill: Capillary refill takes less than 2 seconds.  ?Neurological:  ?   Mental Status: She is alert.  ? ? ?ED Results / Procedures / Treatments   ?Labs ?(all labs ordered are listed, but only abnormal results are displayed) ?Labs Reviewed  ?URINALYSIS, ROUTINE W REFLEX MICROSCOPIC - Abnormal; Notable for the following components:  ?    Result Value  ? Hgb urine dipstick MODERATE (*)   ? Ketones, ur 5 (*)   ? Bacteria, UA RARE (*)   ? All other components within normal limits  ?URINE CULTURE  ?COMPREHENSIVE METABOLIC PANEL  ?CBC WITH DIFFERENTIAL/PLATELET  ?LIPASE, BLOOD  ?CBC  ? ? ?EKG ?None ? ?Radiology ?CT ABDOMEN PELVIS W CONTRAST ? ?Result Date: 08/11/2021 ?CLINICAL DATA:  Abdominal pain. EXAM: CT ABDOMEN AND PELVIS WITH CONTRAST TECHNIQUE: Multidetector CT imaging of the abdomen and pelvis was performed using the standard protocol following bolus administration of intravenous contrast. RADIATION DOSE REDUCTION: This exam was performed according to the departmental dose-optimization program which includes automated exposure control, adjustment of the mA and/or kV according to patient size and/or use of iterative reconstruction technique. CONTRAST:  121m OMNIPAQUE IOHEXOL 300 MG/ML  SOLN COMPARISON:  November 21, 2020 FINDINGS: Lower chest: No acute abnormality. Hepatobiliary: No focal liver abnormality is seen. No gallstones, gallbladder wall thickening, or biliary dilatation. Pancreas: Unremarkable. No pancreatic ductal dilatation or surrounding inflammatory changes. Spleen: Normal in size without focal abnormality. Adrenals/Urinary Tract: Adrenal glands are unremarkable. Kidneys are normal, without renal calculi, focal lesion, or hydronephrosis. Bladder is unremarkable.  Stomach/Bowel: Stomach is within normal limits. Appendix appears normal. No evidence of bowel wall thickening, distention, or inflammatory changes. Scattered colonic diverticulosis without evidence of acute diverticulitis. Vascular/Lymphatic: Aortic atherosclerosis. No enlarged abdominal or pelvic lymph nodes. Ectatic aorta. Reproductive: Uterus and bilateral adnexa are unremarkable. Other: No abdominal wall hernia or abnormality. No abdominopelvic ascites. Musculoskeletal: Severe levoconvex scoliosis of the thoracolumbar spine with associated osteoarthritic changes. IMPRESSION: 1. No acute abnormalities within the abdomen or pelvis. 2. Scattered colonic diverticulosis without evidence of acute diverticulitis. 3. Severe levoconvex scoliosis of the thoracolumbar spine with associated osteoarthritic changes. Aortic Atherosclerosis (ICD10-I70.0). Electronically Signed   By: DFidela SalisburyM.D.   On: 08/11/2021 17:28   ? ?Procedures ?Procedures  ? ? ?Medications Ordered in ED ?Medications  ?sodium chloride (PF) 0.9 % injection (has no administration in time range)  ?iohexol (OMNIPAQUE) 300 MG/ML solution 100 mL (100 mLs Intravenous Contrast Given 08/11/21 1653)  ? ? ?  ED Course/ Medical Decision Making/ A&P ?  ?                        ?Medical Decision Making ?Amount and/or Complexity of Data Reviewed ?Labs: ordered. ? ? ?Patient abdominal pain and hematuria.  CT scan independently interpreted and reassuring.  Initial differential dialysis is somewhat long does include intra-abdominal pathology such as UTI, diverticulitis, bowel obstruction.  Lab work reassuring.  Urine does show some blood but no current infection.  Recent culture also reviewed and was negative.  CT scan does show severe scoliosis.  Potentially be associated with some of the pain.  Urine culture has been ordered.  If it does come back positive I would let urology make the decision on treatment at her appointment in 3 days.  Appears stable for  discharge home.  Does not appear to need medication adjustment at this time.  Will discharge home ? ? ? ? ? ? ? ?Final Clinical Impression(s) / ED Diagnoses ?Final diagnoses:  ?Abdominal pain, unspecified abdominal lo

## 2021-08-11 NOTE — ED Triage Notes (Signed)
Patient c/o generalized abdominal pain radiating to bilateral flank for a few days. States treated for UTI last week.  ?

## 2021-08-11 NOTE — ED Provider Triage Note (Signed)
Emergency Medicine Provider Triage Evaluation Note ? ?Janet Pierce , a 71 y.o. female  was evaluated in triage.  Pt complains of diffuse abdominal pain over the last 3 days.  Is been worsening and constant.  Patient has had normal bowel movements.  No nausea or vomiting.  Pain does radiate to the bilateral flanks.  No fever or chills.  Patient has no surgical history on the abdomen.  She was recently treated for UTI with Bactrim. ? ?Review of Systems  ?Positive:  ?Negative: See above  ? ?Physical Exam  ?BP 133/81 (BP Location: Left Arm)   Pulse 95   Temp 98.7 ?F (37.1 ?C) (Oral)   Resp 17   SpO2 99%  ?Gen:   Awake, no distress   ?Resp:  Normal effort  ?MSK:   Moves extremities without difficulty  ?Other:  Mild diffuse abdominal tenderness.  Abdomen is distended but soft ? ?Medical Decision Making  ?Medically screening exam initiated at 3:33 PM.  Appropriate orders placed.  Janet Pierce was informed that the remainder of the evaluation will be completed by another provider, this initial triage assessment does not replace that evaluation, and the importance of remaining in the ED until their evaluation is complete. ? ? ?  ?Hendricks Limes, PA-C ?08/11/21 1534 ? ?

## 2021-08-11 NOTE — Discharge Instructions (Signed)
Follow-up with your doctors.  Also follow-up with urology as planned.  If the urine culture shows infection, on Wednesday urology can decide if they would like to do antibiotics on it or not. ?

## 2021-08-13 LAB — URINE CULTURE: Culture: NO GROWTH

## 2021-08-21 ENCOUNTER — Ambulatory Visit (INDEPENDENT_AMBULATORY_CARE_PROVIDER_SITE_OTHER): Payer: Medicare Other | Admitting: Physical Medicine and Rehabilitation

## 2021-08-21 ENCOUNTER — Ambulatory Visit: Payer: Self-pay

## 2021-08-21 DIAGNOSIS — M47816 Spondylosis without myelopathy or radiculopathy, lumbar region: Secondary | ICD-10-CM | POA: Diagnosis not present

## 2021-08-21 MED ORDER — METHYLPREDNISOLONE ACETATE 80 MG/ML IJ SUSP
80.0000 mg | Freq: Once | INTRAMUSCULAR | Status: AC
  Administered 2021-08-21: 80 mg

## 2021-08-28 ENCOUNTER — Encounter: Payer: Self-pay | Admitting: Nurse Practitioner

## 2021-08-28 ENCOUNTER — Ambulatory Visit: Payer: Medicare Other | Admitting: Nurse Practitioner

## 2021-08-28 ENCOUNTER — Other Ambulatory Visit: Payer: Self-pay

## 2021-08-28 ENCOUNTER — Encounter: Payer: Medicare Other | Admitting: Physical Medicine and Rehabilitation

## 2021-08-28 VITALS — BP 118/60 | HR 96 | Ht 63.0 in | Wt 143.0 lb

## 2021-08-28 DIAGNOSIS — R1084 Generalized abdominal pain: Secondary | ICD-10-CM | POA: Diagnosis not present

## 2021-08-28 DIAGNOSIS — R140 Abdominal distension (gaseous): Secondary | ICD-10-CM | POA: Diagnosis not present

## 2021-08-28 DIAGNOSIS — R42 Dizziness and giddiness: Secondary | ICD-10-CM

## 2021-08-28 MED ORDER — MECLIZINE HCL 12.5 MG PO TABS: 25.0000 mg | ORAL_TABLET | Freq: Three times a day (TID) | ORAL | Status: AC

## 2021-08-28 NOTE — Progress Notes (Signed)
? ? ?Assessment  ? ?Patient Profile:  ?Janet Pierce is a 71 y.o. female known to Dr. Havery Moros. GI history is pertinent for chronic GERD, intermittent dysphagia, IDA, colon polyps, diverticulosis. She has HTN, HLD. See additional medical history as listed in PMH . ? ?Chronic bloating with associated generalized abdominal pain.   ?Negative small bowel biopsies for celiac. Colonoscopy unrevealing. CT scans unrevealing.  This is her 4th visit for same symptoms over the course of one year. Additionally had recent ED visit for same symptoms. Rule out SIBO ? ?GERD, symptoms controlled with BID PPI.  ?She is having excessive belching but likely related to her bloated state ? ?Microscopic hematuria. Having cystoscopy soon ? ?History of IDA.  ?Unrevealing EGD / colonoscopy Aug 2022. We had her on iron. She isn't really taking it on a regular basis. Recent hgb 12.6. Ferritin 3 months ago was 32.  ? ? ?Plan  ? ?Cannot remember getting a low gas diet. Will give her one  ?Discussed a trial of IB Guard + low gas diet. She previously declined SIBO test due to cost. She would like to proceed with SIBO testing prior to trying IB Guard but we can keep this is mind if SIBO test is negative ?Follow up with Dr. Havery Moros in 6-8 weeks ?Can discontinue oral iron.  ? ? ?HPI  ? ?Chief Complaint : Still bloated with abdominal pain , GERD symptoms ? ?Patient followed by Dr. Havery Moros.  She has chronic GERD, history of iron deficiency anemia. She has seen multiple providers in the practice for abdominal bloating and abdominal pain.  ? ?Patient was seen July 2022 by Nicoletta Ba, PA for evaluation of anemia, abdominal bloating , chronic GERD. Scheduled for EGD / colonoscopy. We scheduled her for CT scan of the abdomen and pelvis which was unrevealing.  Gave her a low gas diet, recommended Gas-X. EGD showed a small HH, esophagus was otherwise normal.  Empiric dilation was done.  Mild antral gastritis.  Gastric biopsies were unremarkable.   Duodenal biopsies negative for celiac . She had three colon polyps removed which were tubular adenomas.  See full reports.  ? ?05/06/21 saw Cottie Banda - Tamala Julian PA for bloating. Epigastric pain. Suggested lactaid, continued carafate for a few days, SIBO breath test recommended.   ? ?08/11/21 ED visit ?Evaluated for abdominal pain and flank pain.  Her CBC was normal.  Lipase was normal.  CMP was normal recently treated for UTI.  Urinalysis was positive for blood, sent for culture and it was negative. CTAP w/ contrast was unrevealing though severe scoliosis and osteoarthritic changes were noted ? ? ?Interval History:  ?Here with ongoing generalized abdominal pain , bloating. Beano does not help. She doesn't recall getting a low gas diet from [place],  had broccoli yesterday.  She doesn't recall ever taking IBGuard. Her bowel movements are normal. Having one to two BMs a day.   ? ?Previous GI Evaluation  ? ? ?Aug 2022 Colonoscopy and EGD  ?-The examined portion of the ileum was normal. ?- Two 2 to 4 mm polyps in the ascending colon, removed with a cold snare. Resected and retrieved. ?- Diverticulosis in the sigmoid colon. ?- One 3 mm polyp in the sigmoid colon, removed with a cold snare. Resected and retrieved. ?- Internal hemorrhoids. ?- The examination was otherwise normal. ?Overall, no concerning pathology to cause the patient's symptoms ? ?Esophagogastric landmarks identified. ?- 1 cm hiatal hernia. ?- Normal esophagus otherwise - no stenosis / stricture noted. Empiric dilation  not done since ?symptoms mild / resolved. ?- Mild antral gastritis. ?- Normal stomach otherwise - biopsies taken to rule out H pylori ?- Duodenal mucosal lymphangiectasia. ?-Otherwise normal duodenum. Biopsies taken to evaluate for celiac.  ? ?Surgical [P], duodenal bx ?- DUODENAL MUCOSA WITH NO SPECIFIC HISTOPATHOLOGIC CHANGES ?- NEGATIVE FOR INCREASED INTRAEPITHELIAL LYMPHOCYTES OR VILLOUS ARCHITECTURAL CHANGES ?2. Surgical [P], gastric bx ?-  GASTRIC ANTRAL AND OXYNTIC MUCOSA WITH NO SPECIFIC HISTOPATHOLOGIC CHANGES ?- WARTHIN STARRY STAIN IS NEGATIVE FOR HELICOBACTER PYLORI ?3. Surgical [P], colon, ascending x2, polyp (2) ?- TUBULAR ADENOMA ?- NEGATIVE FOR HIGH-GRADE DYSPLASIA OR MALIGNANCY ?4. Surgical [P], colon, sigmoid x1, polyp (1) ?- TUBULAR ADENOMA ?- NEGATIVE FOR HIGH-GRADE DYSPLASIA OR MALIGNANCY ? ? ?Labs:  ? ?  Latest Ref Rng & Units 08/11/2021  ?  3:49 PM 05/20/2021  ?  2:06 PM 05/06/2021  ? 12:33 PM  ?CBC  ?WBC 4.0 - 10.5 K/uL 5.6   4.0   4.1    ?Hemoglobin 12.0 - 15.0 g/dL 12.5   12.6   12.1    ?Hematocrit 36.0 - 46.0 % 37.9   38.2   37.6    ?Platelets 150 - 400 K/uL 274   278.0   276.0    ? ? ? ?  Latest Ref Rng & Units 08/11/2021  ?  3:49 PM 05/20/2021  ?  2:06 PM 04/04/2021  ?  8:39 AM  ?Hepatic Function  ?Total Protein 6.5 - 8.1 g/dL 7.5   7.3   6.9    ?Albumin 3.5 - 5.0 g/dL 4.1   4.4   4.1    ?AST 15 - 41 U/L $Remo'21   14   17    'YImmJ$ ?ALT 0 - 44 U/L $Remo'29   15   20    'mHnqr$ ?Alk Phosphatase 38 - 126 U/L 53   49   59    ?Total Bilirubin 0.3 - 1.2 mg/dL 0.8   0.6   0.6    ?Bilirubin, Direct 0.0 - 0.3 mg/dL   0.1    ? ? ? ?Past Medical History:  ?Diagnosis Date  ? Allergy   ? Anemia   ? Anxiety   ? Arthritis   ? Cataract   ? GERD (gastroesophageal reflux disease)   ? Glaucoma   ? Hyperlipidemia   ? Hypertension   ? Osteoporosis   ? 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 Scoliosis   ? ? ?Past Surgical History:  ?Procedure Laterality Date  ? BUNIONECTOMY Left   ? CATARACT EXTRACTION Right   ? EYE SURGERY Right   ? glucoma  ? FINGER SURGERY Right   ? ring finger  ? LIPOMA EXCISION    ? hip  ? RETINAL DETACHMENT SURGERY Left   ? TONSILLECTOMY    ? ? ?Current Medications, Allergies, Family History and Social History were reviewed in [employer] record. ?  ?  ?Current Outpatient Medications  ?Medication Sig Dispense Refill  ? amLODipine (NORVASC) 10 MG tablet TAKE 1 TABLET DAILY BEFORE BREAKFAST 90 tablet 3  ? atorvastatin (LIPITOR) 20  MG tablet TAKE 1 TABLET DAILY 90 tablet 3  ? brimonidine (ALPHAGAN P) 0.1 % SOLN Place 1 drop into the right eye in the morning and at bedtime.    ? cetirizine (ZYRTEC) 10 MG tablet Take 1 tablet (10 mg total) by mouth daily. 30 tablet 11  ? dorzolamide (TRUSOPT) 2 % ophthalmic solution INSTILL 1 DROP INTO BOTH EYES BID  1  ?  guaiFENesin (MUCINEX) 600 MG 12 hr tablet Take 1 tablet (600 mg total) by mouth 2 (two) times daily. 30 tablet 1  ? LUMIGAN 0.01 % SOLN 1 drop at bedtime.    ? Multiple Vitamins-Minerals (MULTIVITAMIN GUMMIES WOMENS) CHEW Chew 2 Doses by mouth every morning.    ? pantoprazole (PROTONIX) 40 MG tablet TAKE 1 TABLET TWICE A DAY 60 tablet 3  ? predniSONE (DELTASONE) 10 MG tablet 3 tabs x3 days and then 2 tabs x3 days and then 1 tab x3 days.  Take w/ food. 18 tablet 0  ? simethicone (MYLICON) 660 MG chewable tablet Chew 125 mg by mouth every 6 (six) hours as needed for flatulence.    ? sucralfate (CARAFATE) 1 g tablet Take 1 tablet (1 g total) by mouth 3 (three) times daily with meals. 90 tablet 0  ? sulfamethoxazole-trimethoprim (BACTRIM DS) 800-160 MG tablet Take 1 tablet by mouth 2 (two) times daily. 6 tablet 0  ? timolol (TIMOPTIC) 0.5 % ophthalmic solution Place 1 drop into the right eye 2 (two) times daily.     ? tiZANidine (ZANAFLEX) 2 MG tablet Take 1 tablet (2 mg total) by mouth every 6 (six) hours as needed for muscle spasms. 30 tablet 0  ? Vitamin D, Ergocalciferol, (DRISDOL) 1.25 MG (50000 UNIT) CAPS capsule Take 1 capsule (50,000 Units total) by mouth every 7 (seven) days. 12 capsule 0  ? ?Current Facility-Administered Medications  ?Medication Dose Route Frequency Provider Last Rate Last Admin  ? methylPREDNISolone acetate (DEPO-MEDROL) injection 80 mg  80 mg Other Once Magnus Sinning, MD      ? ? ?Review of Systems: ?No chest pain. No shortness of breath. No urinary complaints.  ? ? ?Physical Exam ? ?Wt Readings from Last 3 Encounters:  ?07/31/21 143 lb 9.6 oz (65.1 kg)  ?07/15/21  143 lb 9.6 oz (65.1 kg)  ?05/20/21 145 lb (65.8 kg)  ? ? ?BP 118/60   Pulse 96   Ht $R'5\' 3"'GC$  (1.6 m)   Wt 143 lb (64.9 kg)   BMI 25.33 kg/m?  ?Constitutional:  Generally well appearing female in no acute

## 2021-08-28 NOTE — Patient Instructions (Addendum)
You have been given a testing kit to check for small intestine bacterial overgrowth (SIBO) which is completed by a company named [employer].  ? ?Make sure to return your test in the mail using the return mailing label given to you along with the kit. Your demographic and insurance information have already been sent to the company and they should be in contact with you over the next week regarding this test.  ? ?[facility] will collect an upfront charge of $99.74 for commercial insurance plans and $209.74 is you are paying cash. Make sure to discuss with [facility] PRIOR to having the test if they have gotten informatoin from your insurance company as to how much your testing will cost out of pocket, if any. Please keep in mind that you will be getting a call from phone number [phone] or a similar number. If you do not hear from them within this time frame, please call our office at [phone].  ? ?Stop taking Iron. ? ?We have scheduled you a follow up with Dr. Havery Moros on 10/09/21 at 8:30 am. ? ?Thank you for trusting me with your gastrointestinal care!   ? ?Tye Savoy, NP ? ?BMI: ? ?If you are age 58 or older, your body mass index should be between 23-30. Your Body mass index is 25.33 kg/m?Marland Kitchen If this is out of the aforementioned range listed, please consider follow up with your Primary Care Provider. ? ?If you are age 45 or younger, your body mass index should be between 19-25. Your Body mass index is 25.33 kg/m?Marland Kitchen If this is out of the aformentioned range listed, please consider follow up with your Primary Care Provider.  ? ?MY CHART: ? ?The [place] [facility] providers would like to encourage you to use [facility] to communicate with providers for non-urgent requests or questions.  Due to long hold times on the telephone, sending your provider a message by [facility] may be a faster and more efficient way to get a response.  Please allow 48 business hours for a response.  Please remember that this is  for non-urgent requests.  ? ? ?

## 2021-08-29 ENCOUNTER — Telehealth: Payer: Self-pay

## 2021-08-29 NOTE — Progress Notes (Signed)
Agree with assessment and plan as outlined. Can test for SIBO vs. Empiric trial of rifaximin if she hasn't tried that yet. If that does not help or SIBO testing negative, then could consider a trial of Cymbalta given unremarkable workup otherwise ?

## 2021-08-29 NOTE — Telephone Encounter (Signed)
Patient is calling in asking if the meclizine (ANTIVERT) tablet 25 mg can be called into [place], [place]. Patient is asking for a call once it has been sent in if possible.  ?

## 2021-09-03 MED ORDER — MECLIZINE HCL 25 MG PO TABS
25.0000 mg | ORAL_TABLET | Freq: Three times a day (TID) | ORAL | 0 refills | Status: DC | PRN
Start: 2021-09-03 — End: 2022-01-29

## 2021-09-03 NOTE — Telephone Encounter (Signed)
Prescription sent at pt's request 

## 2021-09-03 NOTE — Telephone Encounter (Signed)
Patient is calling in stating the pharmacy gave her a call and said the prescription was denied. Janet Pierce is asking for clarification.  ?

## 2021-09-03 NOTE — Addendum Note (Signed)
Addended by: Midge Minium on: 09/03/2021 10:58 AM ? ? Modules accepted: Orders ? ?

## 2021-09-03 NOTE — Telephone Encounter (Signed)
Looked through pt medication list and do not see an active rx for meclizine can you please review and let me know if this is appropriate or not.  ?

## 2021-09-09 NOTE — Procedures (Signed)
Lumbar Facet Joint Nerve Denervation  [name]      Date of Birth: 1950/12/16 MRN: [id number] PCP: Midge Minium, MD      Visit Date: 08/21/2021   Universal Protocol:    Date/Time: 05/22/238:07 PM  Consent Given By: the patient  Position: PRONE  Additional Comments: Vital signs were monitored before and after the procedure. Patient was prepped and draped in the usual sterile fashion. The correct patient, procedure, and site was verified.   Injection Procedure Details:   Procedure diagnoses:  1. Spondylosis without myelopathy or radiculopathy, lumbar region      Meds Administered:  Meds ordered this encounter  Medications   methylPREDNISolone acetate (DEPO-MEDROL) injection 80 mg     Laterality: Left  Location/Site:  L4-L5, L3 and L4 medial branches and L5-S1, L4 medial branch and L5 dorsal ramus  Needle: 18 ga.,  529m active tip, 10429mRF Cannula  Needle Placement: Along juncture of superior articular process and transverse pocess  Findings:  -Comments:  Procedure Details: For each desired target nerve, the corresponding transverse process (sacral ala for the L5 dorsal rami) was identified and the fluoroscope was positioned to square off the endplates of the corresponding vertebral body to achieve a true AP midline view.  The beam was then obliqued 15 to 20 degrees and caudally tilted 15 to 20 degrees to line up a trajectory along the target nerves. The skin over the target of the junction of superior articulating process and transverse process (sacral ala for the L5 dorsal rami) was infiltrated with 29m14mf 1% Lidocaine without Epinephrine.  The 18 gauge 65m47mtive tip outer cannula was advanced in trajectory view to the target.  This procedure was repeated for each target nerve.  Then, for all levels, the outer cannula placement was fine-tuned and the position was then confirmed with bi-planar imaging.    Test stimulation was done both at sensory  and motor levels to ensure there was no radicular stimulation. The target tissues were then infiltrated with 1 ml of 1% Lidocaine without Epinephrine. Subsequently, a percutaneous neurotomy was carried out for 90 seconds at 80 degrees Celsius.  After the completion of the lesion, 1 ml of injectate was delivered. It was then repeated for each facet joint nerve mentioned above. Appropriate radiographs were obtained to verify the probe placement during the neurotomy.   Additional Comments:  The patient tolerated the procedure well Dressing: 2 x 2 sterile gauze and Band-Aid    Post-procedure details: Patient was observed during the procedure. Post-procedure instructions were reviewed.  Patient left the clinic in stable condition.

## 2021-09-09 NOTE — Progress Notes (Signed)
Jaretzi Droz - 71 y.o. female MRN [id number]  Date of birth: December 02, 1950  Office Visit Note: Visit Date: 08/21/2021 PCP: Midge Minium, MD Referred by: Midge Minium, MD  Subjective: No chief complaint on file.  HPI:  Elyssia Strausser is a 71 y.o. female who comes in todayfor planned radiofrequency ablation of the Left L4-5 and L5-S1 Lumbar facet joints. This would be ablation of the corresponding medial branches and/or dorsal rami.  Patient has had double diagnostic blocks with more than 50% relief.  These are documented on pain diary.  They have had chronic back pain for quite some time, more than 3 months, which has been an ongoing situation with recalcitrant axial back pain.  They have no radicular pain.  Their axial pain is worse with standing and ambulating and on exam today with facet loading.  They have had physical therapy as well as home exercise program.  The imaging noted in the chart below indicated facet pathology. Accordingly they meet all the criteria and qualification for for radiofrequency ablation and we are going to complete this today hopefully for more longer term relief as part of comprehensive management program.  ROS Otherwise per HPI.  Assessment & Plan: Visit Diagnoses:    ICD-10-CM   1. Spondylosis without myelopathy or radiculopathy, lumbar region  M47.816 XR C-ARM NO REPORT    Radiofrequency,Lumbar    methylPREDNISolone acetate (DEPO-MEDROL) injection 80 mg      Plan: No additional findings.   Meds & Orders:  Meds ordered this encounter  Medications   methylPREDNISolone acetate (DEPO-MEDROL) injection 80 mg    Orders Placed This Encounter  Procedures   Radiofrequency,Lumbar   XR C-ARM NO REPORT    Follow-up: Return if symptoms worsen or fail to improve.   Procedures: No procedures performed  Lumbar Facet Joint Nerve Denervation  Patient: Scotty Pinder      Date of Birth: 05-24-1950 MRN: [id number] PCP: Midge Minium,  MD      Visit Date: 08/21/2021   Universal Protocol:    Date/Time: 05/22/238:07 PM  Consent Given By: the patient  Position: PRONE  Additional Comments: Vital signs were monitored before and after the procedure. Patient was prepped and draped in the usual sterile fashion. The correct patient, procedure, and site was verified.   Injection Procedure Details:   Procedure diagnoses:  1. Spondylosis without myelopathy or radiculopathy, lumbar region      Meds Administered:  Meds ordered this encounter  Medications   methylPREDNISolone acetate (DEPO-MEDROL) injection 80 mg     Laterality: Left  Location/Site:  L4-L5, L3 and L4 medial branches and L5-S1, L4 medial branch and L5 dorsal ramus  Needle: 18 ga.,  49m active tip, 1032mRF Cannula  Needle Placement: Along juncture of superior articular process and transverse pocess  Findings:  -Comments:  Procedure Details: For each desired target nerve, the corresponding transverse process (sacral ala for the L5 dorsal rami) was identified and the fluoroscope was positioned to square off the endplates of the corresponding vertebral body to achieve a true AP midline view.  The beam was then obliqued 15 to 20 degrees and caudally tilted 15 to 20 degrees to line up a trajectory along the target nerves. The skin over the target of the junction of superior articulating process and transverse process (sacral ala for the L5 dorsal rami) was infiltrated with 64m59mf 1% Lidocaine without Epinephrine.  The 18 gauge 55m55mtive tip outer cannula was advanced in trajectory view to the  target.  This procedure was repeated for each target nerve.  Then, for all levels, the outer cannula placement was fine-tuned and the position was then confirmed with bi-planar imaging.    Test stimulation was done both at sensory and motor levels to ensure there was no radicular stimulation. The target tissues were then infiltrated with 1 ml of 1% Lidocaine without  Epinephrine. Subsequently, a percutaneous neurotomy was carried out for 90 seconds at 80 degrees Celsius.  After the completion of the lesion, 1 ml of injectate was delivered. It was then repeated for each facet joint nerve mentioned above. Appropriate radiographs were obtained to verify the probe placement during the neurotomy.   Additional Comments:  The patient tolerated the procedure well Dressing: 2 x 2 sterile gauze and Band-Aid    Post-procedure details: Patient was observed during the procedure. Post-procedure instructions were reviewed.  Patient left the clinic in stable condition.      Clinical History: No specialty comments available.     Objective:  VS:  HT:    WT:   BMI:     BP:   HR: bpm  TEMP: ( )  RESP:  Physical Exam Vitals and nursing note reviewed.  Constitutional:      General: She is not in acute distress.    Appearance: Normal appearance. She is not ill-appearing.  HENT:     Head: Normocephalic and atraumatic.     Right Ear: External ear normal.     Left Ear: External ear normal.  Eyes:     Extraocular Movements: Extraocular movements intact.  Cardiovascular:     Rate and Rhythm: Normal rate.     Pulses: Normal pulses.  Pulmonary:     Effort: Pulmonary effort is normal. No respiratory distress.  Abdominal:     General: There is no distension.     Palpations: Abdomen is soft.  Musculoskeletal:        General: Tenderness present.     Cervical back: Neck supple.     Right lower leg: No edema.     Left lower leg: No edema.     Comments: Patient has good distal strength with no pain over the greater trochanters.  No clonus or focal weakness. Patient somewhat slow to rise from a seated position to full extension.  There is concordant low back pain with facet loading and lumbar spine extension rotation.  There are no definitive trigger points but the patient is somewhat tender across the lower back and PSIS.  There is no pain with hip rotation.    Skin:    Findings: No erythema, lesion or rash.  Neurological:     General: No focal deficit present.     Mental Status: She is alert and oriented to person, place, and time.     Sensory: No sensory deficit.     Motor: No weakness or abnormal muscle tone.     Coordination: Coordination normal.  Psychiatric:        Mood and Affect: Mood normal.        Behavior: Behavior normal.     Imaging: No results found.

## 2021-10-02 ENCOUNTER — Ambulatory Visit: Payer: Medicare Other | Admitting: Family Medicine

## 2021-10-09 ENCOUNTER — Ambulatory Visit: Payer: Medicare Other | Admitting: Gastroenterology

## 2021-10-09 ENCOUNTER — Encounter: Payer: Self-pay | Admitting: Gastroenterology

## 2021-10-09 VITALS — BP 102/60 | HR 89 | Ht 63.0 in | Wt 147.0 lb

## 2021-10-09 DIAGNOSIS — R140 Abdominal distension (gaseous): Secondary | ICD-10-CM

## 2021-10-09 DIAGNOSIS — R109 Unspecified abdominal pain: Secondary | ICD-10-CM | POA: Diagnosis not present

## 2021-10-09 NOTE — Progress Notes (Signed)
HPI :  71 year old female here for a follow-up visit for bloating and abdominal discomfort.  See prior clinic notes for details of her case.  She has been seen by multiple providers over the past year for persistent abdominal bloating and pain.  Recall she has had an evaluation for anemia in the past with EGD and colonoscopy, has also had CT scan of the abdomen and pelvis.  None of these test have shown a clear cause for her symptoms.  She was given low gas diet, given Gas-X, these interventions did not help.  She was suggested to use Lactaid which did not help.  She was offered a SIBO breath test but never got it done.  She has microscopic hematuria was seen by urology and had a cystoscopy which she states was normal.  She has otherwise tried Beano over-the-counter which has not helped.  She was recommended to try IBgard in the past but never took it.  She states she still has a lot of intestinal gas and bloating.  She has a lot of gas throughout the day that she passes.  She feels her abdomen gets distended and bloated.  She has a bowel movement once in the morning or twice daily.  Stools are normal formed, brown in color, no blood.  She states she does find relief in her abdomen becomes decompressed with a bowel movement she will feel better until she has to go again.  She does chew a lot of gum, Nicorette.  She does not take any probiotics.  She has some mid abdominal discomfort when her abdomen gets distended and bloated.  She states in general this is really been bothering her more so over the past 1 to 2 years.  At the last visit they suggested a SIBO breath test, she never submitted this.  Of note she had an evaluation as below, history of iron deficiency.  Her most recent CBC in April showed a hemoglobin of 12.5 with an MCV of 86.  Iron studies last checked in January were normal.  She has had 2 CT scans in the past year for bloating and abdominal discomfort, neither has shown any pathology to  cause her symptoms.   Previous GI Evaluation      Aug 2022 Colonoscopy and EGD  -The examined portion of the ileum was normal. - Two 2 to 4 mm polyps in the ascending colon, removed with a cold snare. Resected and retrieved. - Diverticulosis in the sigmoid colon. - One 3 mm polyp in the sigmoid colon, removed with a cold snare. Resected and retrieved. - Internal hemorrhoids. - The examination was otherwise normal. Overall, no concerning pathology to cause the patient's symptoms   Esophagogastric landmarks identified. - 1 cm hiatal hernia. - Normal esophagus otherwise - no stenosis / stricture noted. Empiric dilation not done since symptoms mild / resolved. - Mild antral gastritis. - Normal stomach otherwise - biopsies taken to rule out H pylori - Duodenal mucosal lymphangiectasia. -Otherwise normal duodenum. Biopsies taken to evaluate for celiac.    Surgical [P], duodenal bx - DUODENAL MUCOSA WITH NO SPECIFIC HISTOPATHOLOGIC CHANGES - NEGATIVE FOR INCREASED INTRAEPITHELIAL LYMPHOCYTES OR VILLOUS ARCHITECTURAL CHANGES 2. Surgical [P], gastric bx - GASTRIC ANTRAL AND OXYNTIC MUCOSA WITH NO SPECIFIC HISTOPATHOLOGIC CHANGES - WARTHIN STARRY STAIN IS NEGATIVE FOR HELICOBACTER PYLORI 3. Surgical [P], colon, ascending x2, polyp (2) - TUBULAR ADENOMA - NEGATIVE FOR HIGH-GRADE DYSPLASIA OR MALIGNANCY 4. Surgical [P], colon, sigmoid x1, polyp (1) - TUBULAR ADENOMA -  NEGATIVE FOR HIGH-GRADE DYSPLASIA OR MALIGNANCY   CT scan 08/11/21: IMPRESSION: 1. No acute abnormalities within the abdomen or pelvis. 2. Scattered colonic diverticulosis without evidence of acute diverticulitis. 3. Severe levoconvex scoliosis of the thoracolumbar spine with associated osteoarthritic changes.   Past Medical History:  Diagnosis Date   Allergy    Anemia    Anxiety    Arthritis    Cataract    GERD (gastroesophageal reflux disease)    Glaucoma    Hyperlipidemia    Hypertension    Osteoporosis     Retinal detachment    left eye prosthesis   Scoliosis      Past Surgical History:  Procedure Laterality Date   BUNIONECTOMY Left    CATARACT EXTRACTION Right    EYE SURGERY Right    glucoma   FINGER SURGERY Right    ring finger   LIPOMA EXCISION     hip   RETINAL DETACHMENT SURGERY Left    TONSILLECTOMY     Family History  Problem Relation Age of Onset   Lung cancer Mother    Hypertension Mother    Bone cancer Mother    Liver cancer Mother    Other Sister        brain tumor   Hypertension Brother    Diabetes Maternal Grandmother    Fibroids Daughter    Colon cancer Neg Hx    Colon polyps Neg Hx    Esophageal cancer Neg Hx    Rectal cancer Neg Hx    Stomach cancer Neg Hx    Social History   Tobacco Use   Smoking status: Former    Packs/day: 0.25    Types: Cigarettes    Quit date: 11/23/2016    Years since quitting: 4.8   Smokeless tobacco: Never   Tobacco comments:    stopped by using nicorette gum. has completely stopped  Vaping Use   Vaping Use: Never used  Substance Use Topics   Alcohol use: Yes    Alcohol/week: 0.0 standard drinks of alcohol    Comment: occasionally   Drug use: No   Current Outpatient Medications  Medication Sig Dispense Refill   amLODipine (NORVASC) 10 MG tablet TAKE 1 TABLET DAILY BEFORE BREAKFAST 90 tablet 3   atorvastatin (LIPITOR) 20 MG tablet TAKE 1 TABLET DAILY 90 tablet 3   brimonidine (ALPHAGAN P) 0.1 % SOLN Place 1 drop into the right eye in the morning and at bedtime.     dorzolamide (TRUSOPT) 2 % ophthalmic solution INSTILL 1 DROP INTO BOTH EYES BID  1   LUMIGAN 0.01 % SOLN 1 drop at bedtime.     meclizine (ANTIVERT) 25 MG tablet Take 1 tablet (25 mg total) by mouth 3 (three) times daily as needed for dizziness. 45 tablet 0   Multiple Vitamins-Minerals (MULTIVITAMIN GUMMIES WOMENS) CHEW Chew 2 Doses by mouth every morning.     pantoprazole (PROTONIX) 40 MG tablet TAKE 1 TABLET TWICE A DAY 60 tablet 3   sucralfate  (CARAFATE) 1 g tablet Take 1 tablet (1 g total) by mouth 3 (three) times daily with meals. 90 tablet 0   timolol (TIMOPTIC) 0.5 % ophthalmic solution Place 1 drop into the right eye 2 (two) times daily.      tiZANidine (ZANAFLEX) 2 MG tablet Take 1 tablet (2 mg total) by mouth every 6 (six) hours as needed for muscle spasms. 30 tablet 0   Vitamin D, Ergocalciferol, (DRISDOL) 1.25 MG (50000 UNIT) CAPS capsule Take 1 capsule (  50,000 Units total) by mouth every 7 (seven) days. 12 capsule 0   No current facility-administered medications for this visit.   No Known Allergies   Review of Systems: All systems reviewed and negative except where noted in HPI.   Lab Results  Component Value Date   WBC 5.6 08/11/2021   HGB 12.5 08/11/2021   HCT 37.9 08/11/2021   MCV 86.1 08/11/2021   PLT 274 08/11/2021    Lab Results  Component Value Date   CREATININE 0.76 08/11/2021   BUN 13 08/11/2021   NA 140 08/11/2021   K 4.0 08/11/2021   CL 106 08/11/2021   CO2 27 08/11/2021    Lab Results  Component Value Date   ALT 29 08/11/2021   AST 21 08/11/2021   ALKPHOS 53 08/11/2021   BILITOT 0.8 08/11/2021     Physical Exam: BP 102/60   Pulse 89   Ht '5\' 3"'$  (1.6 m)   Wt 147 lb (66.7 kg)   BMI 26.04 kg/m  Constitutional: Pleasant,well-developed, female in no acute distress. Abdominal: Soft, nondistended, nontender.There are no masses palpable.  Neurological: Alert and oriented to person place and time. Psychiatric: Normal mood and affect. Behavior is normal.   ASSESSMENT AND PLAN: 71 year old female here for reassessment the following:  Abdominal bloating and distention Abdominal pain  As above, CT scan x2 within the past year (1 in the ED for symptoms), EGD and colonoscopy, no clear pathology to account for her symptoms.  I discussed pathophysiology of intestinal gas and bloating with her.  Unfortunately despite some dietary measures, Gas-X, Beano, no significant improvement yet.  She never  submitted testing for SIBO.  Discussed options with her.  She should stop routine use of chewing gum to see if that might help some of this, but otherwise I think reasonable to give her an empiric course of rifaximin which may really help her.  We had a free sample in the office today that I gave her, recommend 550 mg 3 times daily for 2 weeks.  We will see how she responds to this, it is very low risk and no cause to her, will hold off on formal SIBO testing in this light.  Otherwise she never tried IBgard which also may help her with some of her discomfort.  She will await her course on rifaximin first and then use IBgard as needed, I provided her some free samples of that as well.  She can update me in a few weeks with her progress, if recurrent symptoms that persist, may consider course of Cymbalta as next step.  Otherwise she has had an evaluation for IDA, this has since resolved.  Jolly Mango, MD [facility]

## 2021-10-09 NOTE — Patient Instructions (Addendum)
If you are age 71 or older, your body mass index should be between 23-30. Your Body mass index is 26.04 kg/m. If this is out of the aforementioned range listed, please consider follow up with your Primary Care Provider. ________________________________________________________  The [place] [facility] providers would like to encourage you to use [facility] to communicate with providers for non-urgent requests or questions.  Due to long hold times on the telephone, sending your provider a message by [facility] may be a faster and more efficient way to get a response.  Please allow 48 business hours for a response.  Please remember that this is for non-urgent requests.  _______________________________________________________  Please discontinue chewing gum.  START: Xifaxan take three times daily for 2 weeks.   You can use Ibgard as needed, but please do not start taking it until after you have finished Xifaxan.  Thank you for entrusting me with your care and choosing [facility].  Dr Havery Moros

## 2021-10-24 ENCOUNTER — Other Ambulatory Visit: Payer: Self-pay

## 2021-10-24 DIAGNOSIS — R7989 Other specified abnormal findings of blood chemistry: Secondary | ICD-10-CM

## 2021-10-24 MED ORDER — VITAMIN D (ERGOCALCIFEROL) 1.25 MG (50000 UNIT) PO CAPS: 50000.0000 [IU] | ORAL_CAPSULE | ORAL | 0 refills | Status: DC

## 2021-11-01 ENCOUNTER — Other Ambulatory Visit: Payer: Self-pay

## 2021-11-01 ENCOUNTER — Ambulatory Visit (INDEPENDENT_AMBULATORY_CARE_PROVIDER_SITE_OTHER): Payer: Medicare Other | Admitting: Family Medicine

## 2021-11-01 DIAGNOSIS — M810 Age-related osteoporosis without current pathological fracture: Secondary | ICD-10-CM | POA: Diagnosis not present

## 2021-11-01 MED ORDER — DENOSUMAB 60 MG/ML ~~LOC~~ SOSY
60.0000 mg | PREFILLED_SYRINGE | Freq: Once | SUBCUTANEOUS | Status: AC
  Administered 2021-11-01: 60 mg via SUBCUTANEOUS

## 2021-11-01 NOTE — Progress Notes (Unsigned)
Pt here for prolia injection pt reports no concern with first injection, no reaction

## 2021-11-28 ENCOUNTER — Telehealth: Payer: Self-pay | Admitting: Family Medicine

## 2021-11-28 NOTE — Telephone Encounter (Signed)
Left message for patient to call back and schedule Medicare Annual Wellness Visit (AWV).   Please offer to do virtually or by telephone.  Left office number and my jabber [phone].  Last AWV:08/06/2020  Please schedule at anytime with Nurse Health Advisor.

## 2021-12-12 ENCOUNTER — Other Ambulatory Visit: Payer: Self-pay | Admitting: Family Medicine

## 2021-12-12 ENCOUNTER — Other Ambulatory Visit: Payer: Self-pay | Admitting: Lab

## 2021-12-12 MED ORDER — AMLODIPINE BESYLATE 10 MG PO TABS
10.0000 mg | ORAL_TABLET | Freq: Every day | ORAL | 3 refills | Status: DC
Start: 2021-12-12 — End: 2023-01-26

## 2022-01-13 ENCOUNTER — Other Ambulatory Visit: Payer: Self-pay

## 2022-01-13 DIAGNOSIS — R7989 Other specified abnormal findings of blood chemistry: Secondary | ICD-10-CM

## 2022-01-13 MED ORDER — VITAMIN D (ERGOCALCIFEROL) 1.25 MG (50000 UNIT) PO CAPS: 50000.0000 [IU] | ORAL_CAPSULE | ORAL | 1 refills | Status: DC

## 2022-01-15 ENCOUNTER — Encounter: Payer: Self-pay | Admitting: Family Medicine

## 2022-01-15 ENCOUNTER — Telehealth: Payer: Self-pay

## 2022-01-15 ENCOUNTER — Ambulatory Visit: Payer: Medicare Other | Admitting: Family Medicine

## 2022-01-15 VITALS — BP 118/66 | HR 78 | Temp 97.1°F | Resp 16 | Ht 63.0 in | Wt 142.1 lb

## 2022-01-15 DIAGNOSIS — J029 Acute pharyngitis, unspecified: Secondary | ICD-10-CM

## 2022-01-15 DIAGNOSIS — R599 Enlarged lymph nodes, unspecified: Secondary | ICD-10-CM | POA: Diagnosis not present

## 2022-01-15 LAB — CBC WITH DIFFERENTIAL/PLATELET
Basophils Absolute: 0.1 10*3/uL (ref 0.0–0.1)
Basophils Relative: 1.4 % (ref 0.0–3.0)
Eosinophils Absolute: 0.2 10*3/uL (ref 0.0–0.7)
Eosinophils Relative: 3.7 % (ref 0.0–5.0)
HCT: 36.6 % (ref 36.0–46.0)
Hemoglobin: 12.2 g/dL (ref 12.0–15.0)
Lymphocytes Relative: 36.1 % (ref 12.0–46.0)
Lymphs Abs: 1.6 10*3/uL (ref 0.7–4.0)
MCHC: 33.5 g/dL (ref 30.0–36.0)
MCV: 83.6 fl (ref 78.0–100.0)
Monocytes Absolute: 0.4 10*3/uL (ref 0.1–1.0)
Monocytes Relative: 7.9 % (ref 3.0–12.0)
Neutro Abs: 2.3 10*3/uL (ref 1.4–7.7)
Neutrophils Relative %: 50.9 % (ref 43.0–77.0)
Platelets: 301 10*3/uL (ref 150.0–400.0)
RBC: 4.37 Mil/uL (ref 3.87–5.11)
RDW: 13 % (ref 11.5–15.5)
WBC: 4.5 10*3/uL (ref 4.0–10.5)

## 2022-01-15 LAB — POCT RAPID STREP A (OFFICE): Rapid Strep A Screen: NEGATIVE

## 2022-01-15 NOTE — Telephone Encounter (Signed)
Left pt a VM stating lab results  

## 2022-01-15 NOTE — Patient Instructions (Signed)
Follow up as needed or as scheduled Thankfully your strep test is negative! We'll notify you of your lab results and make any changes if needed I suspect that the pain and swollen glands are due to the tooth extraction/mouth infection and that the lymph nodes are just doing their job and responding Drink LOTS of fluids Tylenol or ibuprofen as needed The Penicillin will have treated any infection present Call with any questions or concerns Hang in there!!!

## 2022-01-15 NOTE — Progress Notes (Signed)
   Subjective:    Patient ID: Janet Pierce, female    DOB: 08/03/1950, 71 y.o.   MRN: [id number]  HPI Sore throat- last Tuesday had tooth extraction.  Was given PCN and ibuprofen for 6 days.  Thursday night woke w/ sore tongue.  Sunday developed sore throat.  Pt states that her 'glands are swollen'.  No fevers.  No body aches or chills.  Denies pain w/ swallowing.   Review of Systems For ROS see HPI     Objective:   Physical Exam Vitals reviewed.  Constitutional:      General: She is not in acute distress.    Appearance: She is well-developed. She is not ill-appearing.  HENT:     Head: Normocephalic and atraumatic.     Right Ear: Tympanic membrane and ear canal normal.     Left Ear: Tympanic membrane and ear canal normal.     Mouth/Throat:     Mouth: Mucous membranes are moist. Oral lesions (R sided tongue abrasion/bite) present.     Pharynx: Oropharynx is clear. Uvula midline. No oropharyngeal exudate, posterior oropharyngeal erythema or uvula swelling.     Tonsils: No tonsillar exudate or tonsillar abscesses.  Eyes:     Conjunctiva/sclera: Conjunctivae normal.     Pupils: Pupils are equal, round, and reactive to light.  Neck:     Comments: Bilateral submandibular LN swelling Musculoskeletal:     Cervical back: Normal range of motion and neck supple.  Skin:    General: Skin is warm and dry.  Neurological:     General: No focal deficit present.     Mental Status: She is alert and oriented to person, place, and time.  Psychiatric:        Mood and Affect: Mood normal.        Behavior: Behavior normal.           Assessment & Plan:   Swollen glands- new.  Suspect this is all reactive to her recent tooth infxn, then extraction, and now bite along the side of her tongue.  Throat appears normal.  Will check CBC given her swollen glands but feel this will be self limiting.  Reviewed supportive care and red flags that should prompt return.  Pt expressed understanding and is in  agreement w/ plan.

## 2022-01-15 NOTE — Telephone Encounter (Signed)
-----   Message from Midge Minium, MD sent at 01/15/2022  1:54 PM EDT ----- Blood counts look great!!

## 2022-01-23 ENCOUNTER — Ambulatory Visit: Payer: Medicare Other | Admitting: Family Medicine

## 2022-01-23 ENCOUNTER — Encounter: Payer: Self-pay | Admitting: Family Medicine

## 2022-01-23 ENCOUNTER — Telehealth (INDEPENDENT_AMBULATORY_CARE_PROVIDER_SITE_OTHER): Payer: Medicare Other | Admitting: Family Medicine

## 2022-01-23 VITALS — Wt 143.0 lb

## 2022-01-23 DIAGNOSIS — H9201 Otalgia, right ear: Secondary | ICD-10-CM

## 2022-01-23 MED ORDER — AMOXICILLIN 500 MG PO CAPS: 500.0000 mg | ORAL_CAPSULE | Freq: Three times a day (TID) | ORAL | 0 refills | Status: DC

## 2022-01-23 NOTE — Patient Instructions (Signed)
Try nasal saline rinse twice daily.  -I sent the medication(s) we discussed to your pharmacy in case needed over the weekend, however would be best to have an inperson visit to look in the ear prior to initiating antibiotic. Meds ordered this encounter  Medications   amoxicillin (AMOXIL) 500 MG capsule    Sig: Take 1 capsule (500 mg total) by mouth 3 (three) times daily for 10 days.    Dispense:  30 capsule    Refill:  0     I hope you are feeling better soon!  Seek in person care promptly if your symptoms worsen, new concerns arise or you are not improving with treatment.  It was nice to meet you today. I help [facility] out with telemedicine visits on Tuesdays and Thursdays and am happy to help if you need a virtual follow up visit on those days. Otherwise, if you have any concerns or questions following this visit please schedule a follow up visit with your Primary Care office or seek care at a local urgent care clinic to avoid delays in care. If you are having severe or life threatening symptoms please call 911 and/or go to the nearest emergency room.

## 2022-01-23 NOTE — Progress Notes (Signed)
Virtual Visit via Telephone Note  I connected with Janet Pierce on 01/23/22 at 12:00 PM EDT by telephone and verified that I am speaking with the correct person using two identifiers.   I discussed the limitations of performing an evaluation and management service by telephone and requested permission for a phone visit. The patient expressed understanding and agreed to proceed.  Location patient:   Location provider: work or home office Participants present for the call: patient, provider Patient did not have a visit with me in the prior 7 days to address this/these issue(s).   History of Present Illness:  Acute telemedicine visit for R earache: -Onset: 2-3 days ago -Symptoms include: earache, sore throat a few days ago, swollen glands in the neck, at baseline has some sneezing -Denies:difficulty swallowing, fever, body aches, chills, hearing loss, pain in jaw or ear when opens and closes mouth or chews, hearing loss -she feels has an ear infection as reports has had it before - reports knows her body -reports had some penicillin in September, but has been finished with that -had negative home covid test -Pertinent past medical history: see below, tooth extraction 2.5 weeks ago, tongue felt weird after so she saw her dentist and her PCP and it looked ok.  -Pertinent medication allergies: No Known Allergies -COVID-19 vaccine status: Immunization History  Administered Date(s) Administered   Moderna SARS-COV2 Booster Vaccination 03/12/2020   Moderna Sars-Covid-2 Vaccination 05/22/2019, 06/19/2019      Past Medical History:  Diagnosis Date   Allergy    Anemia    Anxiety    Arthritis    Cataract    GERD (gastroesophageal reflux disease)    Glaucoma    Hyperlipidemia    Hypertension    Osteoporosis    Retinal detachment    left eye prosthesis   Scoliosis     Current Outpatient Medications on File Prior to Visit  Medication Sig Dispense Refill   amLODipine (NORVASC) 10  MG tablet Take 1 tablet (10 mg total) by mouth daily before breakfast. 90 tablet 3   atorvastatin (LIPITOR) 20 MG tablet TAKE 1 TABLET DAILY 90 tablet 3   brimonidine (ALPHAGAN P) 0.1 % SOLN Place 1 drop into the right eye in the morning and at bedtime.     dorzolamide (TRUSOPT) 2 % ophthalmic solution INSTILL 1 DROP INTO BOTH EYES BID  1   LUMIGAN 0.01 % SOLN 1 drop at bedtime.     meclizine (ANTIVERT) 25 MG tablet Take 1 tablet (25 mg total) by mouth 3 (three) times daily as needed for dizziness. 45 tablet 0   Multiple Vitamins-Minerals (MULTIVITAMIN GUMMIES WOMENS) CHEW Chew 2 Doses by mouth every morning.     pantoprazole (PROTONIX) 40 MG tablet TAKE 1 TABLET TWICE A DAY 60 tablet 3   sucralfate (CARAFATE) 1 g tablet Take 1 tablet (1 g total) by mouth 3 (three) times daily with meals. 90 tablet 0   timolol (TIMOPTIC) 0.5 % ophthalmic solution Place 1 drop into the right eye 2 (two) times daily.      tiZANidine (ZANAFLEX) 2 MG tablet Take 1 tablet (2 mg total) by mouth every 6 (six) hours as needed for muscle spasms. 30 tablet 0   Vitamin D, Ergocalciferol, (DRISDOL) 1.25 MG (50000 UNIT) CAPS capsule Take 1 capsule (50,000 Units total) by mouth every 7 (seven) days. 12 capsule 1   No current facility-administered medications on file prior to visit.    Observations/Objective: Patient sounds cheerful and well on the phone.  I do not appreciate any SOB. Speech and thought processing are grossly intact. Patient reported vitals:she denies fever She denies pain in the ear with opening or closing jaw and with tragus pull. She could see me on the video but she could not figure out how to unmute her video so I could not see her. She followed my instructions for tragus tug and optening closing jaw and doe snot seem pain is coming from tmj or external ear canal from limited exam.   Assessment and Plan:  Right ear pain  -we discussed possible serious and likely etiologies, options for evaluation and  workup, limitations of telemedicine visit vs in person visit, treatment, treatment risks and precautions. Pt prefers to treat via telemedicine empirically rather than in person at this moment. Query pain related to recent dental work, ETD, ear infection vs other. She has opted to try some nasal saline twice daily, initiate abx (amoxicillin) if worsening over the weekend, and otherwise seek inperson evaluation with PCP office Monday if not improving.   Advised to seek prompt virtual visit or in person care if worsening, new symptoms arise, or if is not improving with treatment as expected per our conversation of expected course. Discussed options for follow up care. Did let this patient know that I do telemedicine on Tuesdays and Thursdays for [facility] and those are the days I am logged into the system. Advised to schedule follow up visit with PCP, [facility] virtual visits or [facility] if any further questions or concerns to avoid delays in care.   I discussed the assessment and treatment plan with the patient. The patient was provided an opportunity to ask questions and all were answered. The patient agreed with the plan and demonstrated an understanding of the instructions.    Follow Up Instructions:  I did not refer this patient for an OV with me in the next 24 hours for this/these issue(s).  I discussed the assessment and treatment plan with the patient. The patient was provided an opportunity to ask questions and all were answered. The patient agreed with the plan and demonstrated an understanding of the instructions.   I spent 18 minutes on the date of this visit in the care of this patient. See summary of tasks completed to properly care for this patient in the detailed notes above which also included counseling of above, review of PMH, medications, allergies, evaluation of the patient and ordering and/or  instructing patient on testing and care options.     Lucretia Kern, DO

## 2022-01-27 ENCOUNTER — Ambulatory Visit: Payer: Medicare Other | Admitting: Family Medicine

## 2022-01-29 ENCOUNTER — Ambulatory Visit: Payer: Medicare Other | Admitting: Family Medicine

## 2022-01-29 ENCOUNTER — Encounter: Payer: Self-pay | Admitting: Family Medicine

## 2022-01-29 VITALS — BP 116/82 | HR 88 | Temp 97.7°F | Resp 17 | Ht 63.0 in | Wt 143.0 lb

## 2022-01-29 DIAGNOSIS — H6991 Unspecified Eustachian tube disorder, right ear: Secondary | ICD-10-CM

## 2022-01-29 NOTE — Patient Instructions (Signed)
Follow up as needed or as scheduled START a daily allergy medication- Cetirizine or Loratidine- until the first frost.  Get the OTC store brand Drink plenty of fluids! Continue your nasal rinse Call with any questions or concerns Stay Safe!  Stay Healthy! Happy Fall!!

## 2022-01-29 NOTE — Progress Notes (Signed)
   Subjective:    Patient ID: Janet Pierce, female    DOB: [birth year], 71 y.o.   MRN: [id number]  HPI Ear pain- pt had virtual visit 10/5 and was started on Amoxicillin '500mg'$  TID.  Pt only took 1 tab of Amoxicillin and reports R ear feels better.  Continues to have 'swollen glands'.  No fevers.  Denies sore throat.  No HAs.  No sinus pain/pressure.   Review of Systems For ROS see HPI     Objective:   Physical Exam Vitals reviewed.  Constitutional:      General: She is not in acute distress.    Appearance: Normal appearance. She is not ill-appearing.  HENT:     Head: Normocephalic and atraumatic.     Right Ear: Ear canal normal.     Left Ear: Tympanic membrane and ear canal normal.     Ears:     Comments: R TM retracted    Nose: Congestion present. No rhinorrhea.     Mouth/Throat:     Mouth: Mucous membranes are moist.     Pharynx: No oropharyngeal exudate or posterior oropharyngeal erythema.  Neck:     Comments: Area of pt's concern are bilateral submandibular salivary glands Pulmonary:     Effort: Pulmonary effort is normal. No respiratory distress.  Musculoskeletal:     Cervical back: Normal range of motion and neck supple.  Lymphadenopathy:     Cervical: No cervical adenopathy.  Skin:    General: Skin is warm and dry.  Neurological:     General: No focal deficit present.     Mental Status: She is alert and oriented to person, place, and time. Mental status is at baseline.  Psychiatric:        Mood and Affect: Mood normal.        Behavior: Behavior normal.        Thought Content: Thought content normal.           Assessment & Plan:   Eustachian tube dysfxn- new.  Suspect that pt's ear pain is due to eustachian tube dysfxn as it comes and goes.  R TM mildly retracted and swollen nasal turbinates support this dx.  Encouraged daily allergy medication.  Reassurance was provided that her area of concern in her neck was not lymph nodes but rather normal salivary  glands.  Pt expressed understanding and is in agreement w/ plan.

## 2022-02-20 ENCOUNTER — Encounter: Payer: Self-pay | Admitting: Family Medicine

## 2022-02-20 ENCOUNTER — Ambulatory Visit: Payer: Medicare Other | Admitting: Family Medicine

## 2022-02-20 ENCOUNTER — Ambulatory Visit (INDEPENDENT_AMBULATORY_CARE_PROVIDER_SITE_OTHER): Payer: Medicare Other

## 2022-02-20 VITALS — BP 130/60 | HR 82 | Temp 98.6°F | Ht 63.0 in | Wt 144.8 lb

## 2022-02-20 VITALS — Ht 63.0 in | Wt 143.0 lb

## 2022-02-20 DIAGNOSIS — K219 Gastro-esophageal reflux disease without esophagitis: Secondary | ICD-10-CM | POA: Diagnosis not present

## 2022-02-20 DIAGNOSIS — Z122 Encounter for screening for malignant neoplasm of respiratory organs: Secondary | ICD-10-CM

## 2022-02-20 DIAGNOSIS — Z1231 Encounter for screening mammogram for malignant neoplasm of breast: Secondary | ICD-10-CM | POA: Diagnosis not present

## 2022-02-20 DIAGNOSIS — Z0000 Encounter for general adult medical examination without abnormal findings: Secondary | ICD-10-CM | POA: Diagnosis not present

## 2022-02-20 MED ORDER — SUCRALFATE 1 G PO TABS: 1.0000 g | ORAL_TABLET | Freq: Three times a day (TID) | ORAL | 0 refills | Status: DC

## 2022-02-20 MED ORDER — PANTOPRAZOLE SODIUM 40 MG PO TBEC: 40.0000 mg | DELAYED_RELEASE_TABLET | Freq: Every day | ORAL | 1 refills | Status: DC

## 2022-02-20 NOTE — Progress Notes (Signed)
Subjective:   Janet Pierce is a 71 y.o. female who presents for Medicare Annual (Subsequent) preventive examination.   I connected with  Jadzia Ibsen on 02/20/22 by a audio enabled telemedicine application and verified that I am speaking with the correct person using two identifiers.  Patient Location: Home  Provider Location: Home Office  I discussed the limitations of evaluation and management by telemedicine. The patient expressed understanding and agreed to proceed.  Review of Systems     Cardiac Risk Factors include: advanced age (>66mn, >>61women);hypertension     Objective:    Today's Vitals   02/20/22 1105  Weight: 143 lb (64.9 kg)  Height: '5\' 3"'$  (1.6 m)   Body mass index is 25.33 kg/m.     02/20/2022   11:08 AM 08/11/2021    7:47 PM 04/10/2021   10:28 AM 08/06/2020   12:52 PM 04/27/2019   11:20 AM 04/20/2018   11:33 AM 04/16/2017   11:37 AM  Advanced Directives  Does Patient Have a Medical Advance Directive? Yes No Yes No No No No  Type of AParamedicof AMasonLiving will        Does patient want to make changes to medical advance directive?   No - Patient declined      Copy of [place] Chart? No - copy requested        Would patient like information on creating a medical advance directive?    No - Patient declined Yes (MAU/Ambulatory/Procedural Areas - Information given) Yes (MAU/Ambulatory/Procedural Areas - Information given) Yes (MAU/Ambulatory/Procedural Areas - Information given)    Current Medications (verified) Outpatient Encounter Medications as of 02/20/2022  Medication Sig   amLODipine (NORVASC) 10 MG tablet Take 1 tablet (10 mg total) by mouth daily before breakfast.   atorvastatin (LIPITOR) 20 MG tablet TAKE 1 TABLET DAILY   brimonidine (ALPHAGAN P) 0.1 % SOLN Place 1 drop into the right eye in the morning and at bedtime.   dorzolamide (TRUSOPT) 2 % ophthalmic solution INSTILL 1 DROP INTO BOTH  EYES BID   LUMIGAN 0.01 % SOLN 1 drop at bedtime.   Multiple Vitamins-Minerals (MULTIVITAMIN GUMMIES WOMENS) CHEW Chew 2 Doses by mouth every morning.   pantoprazole (PROTONIX) 40 MG tablet TAKE 1 TABLET TWICE A DAY   sucralfate (CARAFATE) 1 g tablet Take 1 tablet (1 g total) by mouth 3 (three) times daily with meals.   timolol (TIMOPTIC) 0.5 % ophthalmic solution Place 1 drop into the right eye 2 (two) times daily.    Vitamin D, Ergocalciferol, (DRISDOL) 1.25 MG (50000 UNIT) CAPS capsule Take 1 capsule (50,000 Units total) by mouth every 7 (seven) days.   No facility-administered encounter medications on file as of 02/20/2022.    Allergies (verified) Patient has no known allergies.   History: Past Medical History:  Diagnosis Date   Allergy    Anemia    Anxiety    Arthritis    Cataract    GERD (gastroesophageal reflux disease)    Glaucoma    Hyperlipidemia    Hypertension    Osteoporosis    Retinal detachment    left eye prosthesis   Scoliosis    Past Surgical History:  Procedure Laterality Date   BUNIONECTOMY Left    CATARACT EXTRACTION Right    EYE SURGERY Right    glucoma   FINGER SURGERY Right    ring finger   LIPOMA EXCISION     hip   RETINAL DETACHMENT  SURGERY Left    TONSILLECTOMY     Family History  Problem Relation Age of Onset   Lung cancer Mother    Hypertension Mother    Bone cancer Mother    Liver cancer Mother    Other Sister        brain tumor   Hypertension Brother    Diabetes Maternal Grandmother    Fibroids Daughter    Colon cancer Neg Hx    Colon polyps Neg Hx    Esophageal cancer Neg Hx    Rectal cancer Neg Hx    Stomach cancer Neg Hx    Social History   Socioeconomic History   Marital status: Legally Separated    Spouse name: Not on file   Number of children: 2   Years of education: Not on file   Highest education level: Not on file  Occupational History   Occupation: sales associate/retired    Comment: [employer]  Tobacco Use    Smoking status: Former    Packs/day: 0.25    Types: Cigarettes    Quit date: 11/23/2016    Years since quitting: 5.2   Smokeless tobacco: Never   Tobacco comments:    stopped by using nicorette gum. has completely stopped  Vaping Use   Vaping Use: Never used  Substance and Sexual Activity   Alcohol use: Yes    Alcohol/week: 0.0 standard drinks of alcohol    Comment: occasionally   Drug use: No   Sexual activity: Not on file  Other Topics Concern   Not on file  Social History Narrative   Not on file   Social Determinants of Health   Financial Resource Strain: Low Risk  (02/20/2022)   Overall Financial Resource Strain (CARDIA)    Difficulty of Paying Living Expenses: Not hard at all  Food Insecurity: No Food Insecurity (02/20/2022)   Hunger Vital Sign    Worried About Running Out of Food in the Last Year: Never true    Ran Out of Food in the Last Year: Never true  Transportation Needs: No Transportation Needs (02/20/2022)   PRAPARE - Hydrologist (Medical): No    Lack of Transportation (Non-Medical): No  Physical Activity: Insufficiently Active (02/20/2022)   Exercise Vital Sign    Days of Exercise per Week: 3 days    Minutes of Exercise per Session: 30 min  Stress: No Stress Concern Present (02/20/2022)   [place]    Feeling of Stress : Not at all  Social Connections: Moderately Isolated (02/20/2022)   Social Connection and Isolation Panel [NHANES]    Frequency of Communication with Friends and Family: More than three times a week    Frequency of Social Gatherings with Friends and Family: More than three times a week    Attends Religious Services: Never    Marine scientist or Organizations: Yes    Attends Music therapist: More than 4 times per year    Marital Status: Separated    Tobacco Counseling Counseling given: Not Answered Tobacco comments: stopped  by using nicorette gum. has completely stopped   Clinical Intake:  Pre-visit preparation completed: Yes  Pain : No/denies pain     Nutritional Risks: None Diabetes: No  How often do you need to have someone help you when you read instructions, pamphlets, or other written materials from your doctor or pharmacy?: 1 - Never  Diabetic?no   Interpreter Needed?:  No  Information entered by :: Jadene Pierini, LPN   Activities of Daily Living    02/20/2022   11:08 AM 04/04/2021    8:19 AM  In your present state of health, do you have any difficulty performing the following activities:  Hearing? 0 0  Vision? 0 0  Difficulty concentrating or making decisions? 0 0  Walking or climbing stairs? 0 0  Dressing or bathing? 0 0  Doing errands, shopping? 0 0  Preparing Food and eating ? N   Using the Toilet? N   In the past six months, have you accidently leaked urine? N   Do you have problems with loss of bowel control? N   Managing your Medications? N   Managing your Finances? N   Housekeeping or managing your Housekeeping? N     Patient Care Team: Midge Minium, MD as PCP - [place], Bonnie (Hand Surgery) Magnus Sinning, MD as Consulting Physician (Physical Medicine and Rehabilitation) Leandrew Koyanagi, MD as Consulting Physician (Orthopedic Surgery) Corey Harold, MD as Consulting Physician (Ophthalmology)  Indicate any recent Medical Services you may have received from other than [facility] providers in the past year (date may be approximate).     Assessment:   This is a routine wellness examination for Corene.  Hearing/Vision screen Vision Screening - Comments:: Annual eye exams wears glasses   Dietary issues and exercise activities discussed: Current Exercise Habits: Home exercise routine, Time (Minutes): 30, Frequency (Times/Week): 3, Weekly Exercise (Minutes/Week): 90, Exercise limited by: None identified   Goals Addressed             This Visit's  Progress    Increase physical activity   On track    Increase activity and decrease sugar intake.        Depression Screen    02/20/2022   11:07 AM 01/15/2022    9:55 AM 07/31/2021   12:07 PM 04/12/2021   11:49 AM 04/04/2021    8:18 AM 11/30/2020    3:25 PM 10/31/2020    9:24 AM  PHQ 2/9 Scores  PHQ - 2 Score 0 0 0 0 0 0 0  PHQ- 9 Score 0 1 0 1 2 0 0    Fall Risk    02/20/2022   11:06 AM 01/15/2022    9:55 AM 07/31/2021   12:07 PM 05/20/2021    1:25 PM 04/12/2021   11:49 AM  Fall Risk   Falls in the past year? 0 0 0 0 0  Number falls in past yr: 0  0 0   Injury with Fall? 0  0 0   Risk for fall due to : No Fall Risks No Fall Risks No Fall Risks No Fall Risks No Fall Risks  Follow up Falls prevention discussed Falls evaluation completed Falls evaluation completed Falls evaluation completed Falls evaluation completed    FALL RISK PREVENTION PERTAINING TO THE HOME:  Any stairs in or around the home? No  If so, are there any without handrails? No  Home free of loose throw rugs in walkways, pet beds, electrical cords, etc? Yes  Adequate lighting in your home to reduce risk of falls? Yes   ASSISTIVE DEVICES UTILIZED TO PREVENT FALLS:  Life alert? No  Use of a cane, walker or w/c? No  Grab bars in the bathroom? No  Shower chair or bench in shower? No  Elevated toilet seat or a handicapped toilet? No       04/20/2018  11:36 AM  MMSE - Mini Mental State Exam  Orientation to time 5  Orientation to Place 5  Registration 3  Attention/ Calculation 5  Recall 2  Language- name 2 objects 2  Language- repeat 1  Language- follow 3 step command 3  Language- read & follow direction 1  Write a sentence 1  Copy design 1  Total score 29        02/20/2022   11:09 AM  6CIT Screen  What Year? 0 points  What month? 0 points  What time? 0 points  Count back from 20 0 points  Months in reverse 0 points  Repeat phrase 0 points  Total Score 0 points     Immunizations Immunization History  Administered Date(s) Administered   Moderna SARS-COV2 Booster Vaccination 03/12/2020   Moderna Sars-Covid-2 Vaccination 05/22/2019, 06/19/2019    TDAP status: Due, Education has been provided regarding the importance of this vaccine. Advised may receive this vaccine at local pharmacy or Health Dept. Aware to provide a copy of the vaccination record if obtained from local pharmacy or Health Dept. Verbalized acceptance and understanding.  Flu Vaccine status: Due, Education has been provided regarding the importance of this vaccine. Advised may receive this vaccine at local pharmacy or Health Dept. Aware to provide a copy of the vaccination record if obtained from local pharmacy or Health Dept. Verbalized acceptance and understanding.  Pneumococcal vaccine status: Due, Education has been provided regarding the importance of this vaccine. Advised may receive this vaccine at local pharmacy or Health Dept. Aware to provide a copy of the vaccination record if obtained from local pharmacy or Health Dept. Verbalized acceptance and understanding.  Covid-19 vaccine status: Completed vaccines  Qualifies for Shingles Vaccine? Yes   Zostavax completed No   Shingrix Completed?: No.    Education has been provided regarding the importance of this vaccine. Patient has been advised to call insurance company to determine out of pocket expense if they have not yet received this vaccine. Advised may also receive vaccine at local pharmacy or Health Dept. Verbalized acceptance and understanding.  Screening Tests Health Maintenance  Topic Date Due   MAMMOGRAM  01/23/2022   INFLUENZA VACCINE  07/20/2022 (Originally 11/19/2021)   Pneumonia Vaccine 55+ Years old (1 - PCV) 01/16/2023 (Originally 03/23/2016)   Medicare Annual Wellness (AWV)  02/21/2023   COLONOSCOPY (Pts 45-53yr Insurance coverage will need to be confirmed)  12/11/2023   DEXA SCAN  Completed   HPV VACCINES  Aged  Out   TETANUS/TDAP  Discontinued   COVID-19 Vaccine  Discontinued   Hepatitis C Screening  Discontinued   Zoster Vaccines- Shingrix  Discontinued    Health Maintenance  Health Maintenance Due  Topic Date Due   MAMMOGRAM  01/23/2022    Colorectal cancer screening: Type of screening: Colonoscopy. Completed 12/10/2020. Repeat every 3 years  Mammogram status: Ordered 02/20/2022. Pt provided with contact info and advised to call to schedule appt.   Bone Density status: Completed 09/16/2019. Results reflect: Bone density results: OSTEOPENIA. Repeat every 5 years.  Lung Cancer Screening: (Low Dose CT Chest recommended if Age 71-80years, 30 pack-year currently smoking OR have quit w/in 15years.) does not qualify.   Lung Cancer Screening Referral: n/a  Additional Screening:  Hepatitis C Screening: does not qualify;  Vision Screening: Recommended annual ophthalmology exams for early detection of glaucoma and other disorders of the eye. Is the patient up to date with their annual eye exam?  Yes  Who is the provider  or what is the name of the office in which the patient attends annual eye exams? Dr.Chin  If pt is not established with a provider, would they like to be referred to a provider to establish care? No .   Dental Screening: Recommended annual dental exams for proper oral hygiene  Community Resource Referral / Chronic Care Management: CRR required this visit?  No   CCM required this visit?  No      Plan:     I have personally reviewed and noted the following in the patient's chart:   Medical and social history Use of alcohol, tobacco or illicit drugs  Current medications and supplements including opioid prescriptions. Patient is not currently taking opioid prescriptions. Functional ability and status Nutritional status Physical activity Advanced directives List of other physicians Hospitalizations, surgeries, and ER visits in previous 12 months Vitals Screenings  to include cognitive, depression, and falls Referrals and appointments  In addition, I have reviewed and discussed with patient certain preventive protocols, quality metrics, and best practice recommendations. A written personalized care plan for preventive services as well as general preventive health recommendations were provided to patient.     Daphane Shepherd, LPN   24/05/3534   Nurse Notes: due all vaccines

## 2022-02-20 NOTE — Assessment & Plan Note (Signed)
Deteriorated.  Pt has again stopped her PPI and is likely exacerbating her sxs w/ use of NSAID and nicotine.  Restart daily PPI.  Carafate TID and QHS for immediate relief until sxs improve.  Reviewed dietary and lifestyle modifications that will improve sxs.  Pt expressed understanding and is in agreement w/ plan.

## 2022-02-20 NOTE — Patient Instructions (Addendum)
Follow up as needed or as scheduled START the Sucralfate before meals and before bed for the next 5-7 days for immediate relief of indigestion RESTART the Pantoprazole once daily- every day- to decrease the acid production Avoid ibuprofen and take Tylenol as needed Call with any questions or concerns Hang in there!!!

## 2022-02-20 NOTE — Patient Instructions (Signed)
Janet Pierce , Thank you for taking time to come for your Medicare Wellness Visit. I appreciate your ongoing commitment to your health goals. Please review the following plan we discussed and let me know if I can assist you in the future.   These are the goals we discussed:  Goals      Increase physical activity     Increase activity and decrease sugar intake.         This is a list of the screening recommended for you and due dates:  Health Maintenance  Topic Date Due   Mammogram  01/23/2022   Flu Shot  07/20/2022*   Pneumonia Vaccine (1 - PCV) 01/16/2023*   Medicare Annual Wellness Visit  02/21/2023   Colon Cancer Screening  12/11/2023   DEXA scan (bone density measurement)  Completed   HPV Vaccine  Aged Out   Tetanus Vaccine  Discontinued   COVID-19 Vaccine  Discontinued   Hepatitis C Screening: USPSTF Recommendation to screen - Ages 31-79 yo.  Discontinued   Zoster (Shingles) Vaccine  Discontinued  *Topic was postponed. The date shown is not the original due date.    Advanced directives: Please bring a copy of your health care power of attorney and living will to the office to be added to your chart at your convenience.   Conditions/risks identified: Aim for 30 minutes of exercise or brisk walking, 6-8 glasses of water, and 5 servings of fruits and vegetables each day.   Next appointment: Follow up in one year for your annual wellness visit    Preventive Care 65 Years and Older, Female Preventive care refers to lifestyle choices and visits with your health care provider that can promote health and wellness. What does preventive care include? A yearly physical exam. This is also called an annual well check. Dental exams once or twice a year. Routine eye exams. Ask your health care provider how often you should have your eyes checked. Personal lifestyle choices, including: Daily care of your teeth and gums. Regular physical activity. Eating a healthy diet. Avoiding  tobacco and drug use. Limiting alcohol use. Practicing safe sex. Taking low-dose aspirin every day. Taking vitamin and mineral supplements as recommended by your health care provider. What happens during an annual well check? The services and screenings done by your health care provider during your annual well check will depend on your age, overall health, lifestyle risk factors, and family history of disease. Counseling  Your health care provider may ask you questions about your: Alcohol use. Tobacco use. Drug use. Emotional well-being. Home and relationship well-being. Sexual activity. Eating habits. History of falls. Memory and ability to understand (cognition). Work and work Statistician. Reproductive health. Screening  You may have the following tests or measurements: Height, weight, and BMI. Blood pressure. Lipid and cholesterol levels. These may be checked every 5 years, or more frequently if you are over 59 years old. Skin check. Lung cancer screening. You may have this screening every year starting at age 16 if you have a 30-pack-year history of smoking and currently smoke or have quit within the past 15 years. Fecal occult blood test (FOBT) of the stool. You may have this test every year starting at age 70. Flexible sigmoidoscopy or colonoscopy. You may have a sigmoidoscopy every 5 years or a colonoscopy every 10 years starting at age 39. Hepatitis C blood test. Hepatitis B blood test. Sexually transmitted disease (STD) testing. Diabetes screening. This is done by checking your blood sugar (glucose)  after you have not eaten for a while (fasting). You may have this done every 1-3 years. Bone density scan. This is done to screen for osteoporosis. You may have this done starting at age 27. Mammogram. This may be done every 1-2 years. Talk to your health care provider about how often you should have regular mammograms. Talk with your health care provider about your test  results, treatment options, and if necessary, the need for more tests. Vaccines  Your health care provider may recommend certain vaccines, such as: Influenza vaccine. This is recommended every year. Tetanus, diphtheria, and acellular pertussis (Tdap, Td) vaccine. You may need a Td booster every 10 years. Zoster vaccine. You may need this after age 38. Pneumococcal 13-valent conjugate (PCV13) vaccine. One dose is recommended after age 67. Pneumococcal polysaccharide (PPSV23) vaccine. One dose is recommended after age 39. Talk to your health care provider about which screenings and vaccines you need and how often you need them. This information is not intended to replace advice given to you by your health care provider. Make sure you discuss any questions you have with your health care provider. Document Released: 05/04/2015 Document Revised: 12/26/2015 Document Reviewed: 02/06/2015 Elsevier Interactive Patient Education  2017 [place] Prevention in the Home Falls can cause injuries. They can happen to people of all ages. There are many things you can do to make your home safe and to help prevent falls. What can I do on the outside of my home? Regularly fix the edges of walkways and driveways and fix any cracks. Remove anything that might make you trip as you walk through a door, such as a raised step or threshold. Trim any bushes or trees on the path to your home. Use bright outdoor lighting. Clear any walking paths of anything that might make someone trip, such as rocks or tools. Regularly check to see if handrails are loose or broken. Make sure that both sides of any steps have handrails. Any raised decks and porches should have guardrails on the edges. Have any leaves, snow, or ice cleared regularly. Use sand or salt on walking paths during winter. Clean up any spills in your garage right away. This includes oil or grease spills. What can I do in the bathroom? Use night  lights. Install grab bars by the toilet and in the tub and shower. Do not use towel bars as grab bars. Use non-skid mats or decals in the tub or shower. If you need to sit down in the shower, use a plastic, non-slip stool. Keep the floor dry. Clean up any water that spills on the floor as soon as it happens. Remove soap buildup in the tub or shower regularly. Attach bath mats securely with double-sided non-slip rug tape. Do not have throw rugs and other things on the floor that can make you trip. What can I do in the bedroom? Use night lights. Make sure that you have a light by your bed that is easy to reach. Do not use any sheets or blankets that are too big for your bed. They should not hang down onto the floor. Have a firm chair that has side arms. You can use this for support while you get dressed. Do not have throw rugs and other things on the floor that can make you trip. What can I do in the kitchen? Clean up any spills right away. Avoid walking on wet floors. Keep items that you use a lot in easy-to-reach places. If  you need to reach something above you, use a strong step stool that has a grab bar. Keep electrical cords out of the way. Do not use floor polish or wax that makes floors slippery. If you must use wax, use non-skid floor wax. Do not have throw rugs and other things on the floor that can make you trip. What can I do with my stairs? Do not leave any items on the stairs. Make sure that there are handrails on both sides of the stairs and use them. Fix handrails that are broken or loose. Make sure that handrails are as long as the stairways. Check any carpeting to make sure that it is firmly attached to the stairs. Fix any carpet that is loose or worn. Avoid having throw rugs at the top or bottom of the stairs. If you do have throw rugs, attach them to the floor with carpet tape. Make sure that you have a light switch at the top of the stairs and the bottom of the stairs. If  you do not have them, ask someone to add them for you. What else can I do to help prevent falls? Wear shoes that: Do not have high heels. Have rubber bottoms. Are comfortable and fit you well. Are closed at the toe. Do not wear sandals. If you use a stepladder: Make sure that it is fully opened. Do not climb a closed stepladder. Make sure that both sides of the stepladder are locked into place. Ask someone to hold it for you, if possible. Clearly mark and make sure that you can see: Any grab bars or handrails. First and last steps. Where the edge of each step is. Use tools that help you move around (mobility aids) if they are needed. These include: Canes. Walkers. Scooters. Crutches. Turn on the lights when you go into a dark area. Replace any light bulbs as soon as they burn out. Set up your furniture so you have a clear path. Avoid moving your furniture around. If any of your floors are uneven, fix them. If there are any pets around you, be aware of where they are. Review your medicines with your doctor. Some medicines can make you feel dizzy. This can increase your chance of falling. Ask your doctor what other things that you can do to help prevent falls. This information is not intended to replace advice given to you by your health care provider. Make sure you discuss any questions you have with your health care provider. Document Released: 02/01/2009 Document Revised: 09/13/2015 Document Reviewed: 05/12/2014 Elsevier Interactive Patient Education  2017 [employer].

## 2022-02-20 NOTE — Progress Notes (Signed)
   Subjective:    Patient ID: Janet Pierce, female    DOB: 1950/11/01, 71 y.o.   MRN: [id number]  HPI Indigestion- pt reports that she has been waking at night w/ increased reflux and indigestion.  Belching improves sxs.  Chewing nicotine gum.  Sxs started a few weeks ago.  Has been taking ibuprofen for arthritis pain.  Admits she has not been taking PPI.   Review of Systems For ROS see HPI     Objective:   Physical Exam Vitals reviewed.  Constitutional:      General: She is not in acute distress.    Appearance: Normal appearance. She is not ill-appearing.  HENT:     Head: Normocephalic and atraumatic.  Cardiovascular:     Rate and Rhythm: Normal rate and regular rhythm.  Pulmonary:     Effort: Pulmonary effort is normal. No respiratory distress.     Breath sounds: No wheezing.  Abdominal:     General: There is no distension.     Palpations: Abdomen is soft.     Tenderness: There is no abdominal tenderness. There is no guarding or rebound.  Skin:    General: Skin is warm and dry.  Neurological:     General: No focal deficit present.     Mental Status: She is alert and oriented to person, place, and time.  Psychiatric:        Mood and Affect: Mood normal.        Behavior: Behavior normal.        Thought Content: Thought content normal.           Assessment & Plan:

## 2022-02-21 ENCOUNTER — Other Ambulatory Visit: Payer: Self-pay

## 2022-02-21 MED ORDER — SUCRALFATE 1 G PO TABS: 1.0000 g | ORAL_TABLET | Freq: Three times a day (TID) | ORAL | 0 refills | Status: DC

## 2022-03-19 ENCOUNTER — Other Ambulatory Visit: Payer: Self-pay

## 2022-03-19 DIAGNOSIS — Z87891 Personal history of nicotine dependence: Secondary | ICD-10-CM

## 2022-03-19 DIAGNOSIS — Z122 Encounter for screening for malignant neoplasm of respiratory organs: Secondary | ICD-10-CM

## 2022-03-31 ENCOUNTER — Encounter: Payer: Self-pay | Admitting: Family Medicine

## 2022-03-31 ENCOUNTER — Ambulatory Visit (INDEPENDENT_AMBULATORY_CARE_PROVIDER_SITE_OTHER): Payer: Medicare Other | Admitting: Family Medicine

## 2022-03-31 ENCOUNTER — Ambulatory Visit
Admission: RE | Admit: 2022-03-31 | Discharge: 2022-03-31 | Disposition: A | Discharge status: Home / Self Care | Payer: Medicare Other | Source: Ambulatory Visit | Attending: Family Medicine | Admitting: Family Medicine

## 2022-03-31 VITALS — BP 130/82 | HR 80 | Temp 98.7°F | Resp 17 | Ht 63.0 in | Wt 146.5 lb

## 2022-03-31 DIAGNOSIS — M7989 Other specified soft tissue disorders: Secondary | ICD-10-CM

## 2022-03-31 NOTE — Progress Notes (Signed)
   Subjective:    Patient ID: Janet Pierce, female    DOB: December 25, 1950, 71 y.o.   MRN: [id number]  HPI Lump- first noticed on Tuesday.  Sore to press on, hurts to lie on.  Feels subcutaneous.  'it's right on my rib'.  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   Review of Systems For ROS see HPI     Objective:   Physical Exam Vitals reviewed.  Constitutional:      General: She is not in acute distress.    Appearance: Normal appearance. She is not ill-appearing.  Pulmonary:     Effort: Pulmonary effort is normal. No respiratory distress.  Chest:     Chest wall: Tenderness (2" soft tissue mass in L mid-axillary line over lower rib) present.  Skin:    General: Skin is warm and dry.  Neurological:     General: No focal deficit present.     Mental Status: She is alert and oriented to person, place, and time.  Psychiatric:        Mood and Affect: Mood normal.        Behavior: Behavior normal.        Thought Content: Thought content normal.           Assessment & Plan:  Soft tissue mass- new.  TTP.  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  Will get [place] to assess and if it continues to be bothersome/painful, will refer to surgery for evaluation.  Pt expressed understanding and is in agreement w/ plan.

## 2022-03-31 NOTE — Patient Instructions (Signed)
Follow up as needed or as scheduled We'll call you to schedule your ultrasound I think it's another lipoma but we will be sure Call with any questions or concerns Stay Safe!  Stay Healthy! Happy Holidays!! ENJOY YOUR CRUISE!!!

## 2022-04-01 ENCOUNTER — Other Ambulatory Visit: Payer: Medicare Other

## 2022-04-02 ENCOUNTER — Telehealth: Payer: Self-pay

## 2022-04-02 DIAGNOSIS — M7989 Other specified soft tissue disorders: Secondary | ICD-10-CM

## 2022-04-02 NOTE — Telephone Encounter (Signed)
-----   Message from Midge Minium, MD sent at 04/02/2022  7:42 AM EST ----- Your ultrasound doesn't clearly define what's going on over your ribs.  It says it may be inflammatory or a bruise of the chest wall.  They indicate that if it doesn't go down or if it enlarges, we should get an MRI to better assess.  If you want to proceed with the MRI we can certainly order it, or if you want to wait and see if it improves on its own, that's fine too.  Just let me know

## 2022-04-02 NOTE — Addendum Note (Signed)
Addended by: Midge Minium on: 04/02/2022 03:34 PM   Modules accepted: Orders

## 2022-04-02 NOTE — Telephone Encounter (Signed)
I informed pt of [place] results . Pt would like to proceed with trying to get an MRI .

## 2022-04-02 NOTE — Telephone Encounter (Signed)
Left pt a VM stating we ordered the MRI and that Danae Chen will call her when she gets her an apt . Advised pt that it can take up to 2 weeks to hear anything back if she has not heard from [place] in that time frame advised to call our office

## 2022-04-02 NOTE — Telephone Encounter (Signed)
Order for MRI entered

## 2022-05-07 ENCOUNTER — Ambulatory Visit (INDEPENDENT_AMBULATORY_CARE_PROVIDER_SITE_OTHER): Payer: Medicare Other | Admitting: Acute Care

## 2022-05-07 ENCOUNTER — Encounter: Payer: Self-pay | Admitting: Acute Care

## 2022-05-07 DIAGNOSIS — F17210 Nicotine dependence, cigarettes, uncomplicated: Secondary | ICD-10-CM

## 2022-05-07 NOTE — Patient Instructions (Signed)
Thank you for participating in the [facility] Screening [facility]. It was our pleasure to meet you today. We will call you with the results of your scan within the next few days. Your scan will be assigned a Lung RADS category score by the physicians reading the scans.  This Lung RADS score determines follow up scanning.  See below for description of categories, and follow up screening recommendations. We will be in touch to schedule your follow up screening annually or based on recommendations of our providers. We will fax a copy of your scan results to your Primary Care Physician, or the physician who referred you to the program, to ensure they have the results. Please call the office if you have any questions or concerns regarding your scanning experience or results.  Our office number is [phone]. Please speak with Denise Phelps, RN. , or  Denise Buckner RN, They are  our Lung Cancer Screening RN.'s If They are unavailable when you call, Please leave a message on the voice mail. We will return your call at our earliest convenience.This voice mail is monitored several times a day.  Remember, if your scan is normal, we will scan you annually as long as you continue to meet the criteria for the program. (Age 50-80, Current smoker or smoker who has quit within the last 15 years). If you are a smoker, remember, quitting is the single most powerful action that you can take to decrease your risk of lung cancer and other pulmonary, breathing related problems. We know quitting is hard, and we are here to help.  Please let us know if there is anything we can do to help you meet your goal of quitting. If you are a former smoker, congratulations. We are proud of you! Remain smoke free! Remember you can refer friends or family members through the number above.  We will screen them to make sure they meet criteria for the program. Thank you for helping us take better care of you by  participating in Lung Screening.  You can receive free nicotine replacement therapy ( patches, gum or mints) by calling 1-800-QUIT NOW. Please call so we can get you on the path to becoming  a non-smoker. I know it is hard, but you can do this!  Lung RADS Categories:  Lung RADS 1: no nodules or definitely non-concerning nodules.  Recommendation is for a repeat annual scan in 12 months.  Lung RADS 2:  nodules that are non-concerning in appearance and behavior with a very low likelihood of becoming an active cancer. Recommendation is for a repeat annual scan in 12 months.  Lung RADS 3: nodules that are probably non-concerning , includes nodules with a low likelihood of becoming an active cancer.  Recommendation is for a 6-month repeat screening scan. Often noted after an upper respiratory illness. We will be in touch to make sure you have no questions, and to schedule your 6-month scan.  Lung RADS 4 A: nodules with concerning findings, recommendation is most often for a follow up scan in 3 months or additional testing based on our provider's assessment of the scan. We will be in touch to make sure you have no questions and to schedule the recommended 3 month follow up scan.  Lung RADS 4 B:  indicates findings that are concerning. We will be in touch with you to schedule additional diagnostic testing based on our provider's  assessment of the scan.  Other options for assistance in smoking cessation (   As covered by your insurance benefits)  Hypnosis for smoking cessation  [employer]. [phone]  Acupuncture for smoking cessation  [employer] [phone]

## 2022-05-07 NOTE — Progress Notes (Signed)
Virtual Visit via Telephone Note  I connected with Janet Pierce on 05/07/22 at 10:30 AM EST by telephone and verified that I am speaking with the correct person using two identifiers.  Location: Patient:  At home Provider: [place], [place], [place], [address]    I discussed the limitations, risks, security and privacy concerns of performing an evaluation and management service by telephone and the availability of in person appointments. I also discussed with the patient that there may be a patient responsible charge related to this service. The patient expressed understanding and agreed to proceed.   Shared Decision Making Visit Lung Cancer Screening Program [phone])   Eligibility: Age 72 y.o. Pack Years Smoking History Calculation 25 pack year smoking history (# packs/per year x # years smoked) Recent History of coughing up blood  no Unexplained weight loss? no ( >Than 15 pounds within the last 6 months ) Prior History Lung / other cancer no (Diagnosis within the last 5 years already requiring surveillance chest CT Scans). Smoking Status Current Smoker Former Smokers: Years since quit:  NA  Quit Date:  NA  Visit Components: Discussion included one or more decision making aids. yes Discussion included risk/benefits of screening. yes Discussion included potential follow up diagnostic testing for abnormal scans. yes Discussion included meaning and risk of over diagnosis. yes Discussion included meaning and risk of False Positives. yes Discussion included meaning of total radiation exposure. yes  Counseling Included: Importance of adherence to annual lung cancer LDCT screening. yes Impact of comorbidities on ability to participate in the program. yes Ability and willingness to under diagnostic treatment. yes  Smoking Cessation Counseling: Current Smokers:  Discussed importance of smoking cessation. yes Information about tobacco cessation classes and interventions  provided to patient. yes Patient provided with "ticket" for LDCT Scan. yes Symptomatic Patient. no  Counseling NA Diagnosis Code: Tobacco Use Z72.0 Asymptomatic Patient yes  Counseling (Intermediate counseling: > three minutes counseling) U0454 Former Smokers:  Discussed the importance of maintaining cigarette abstinence. yes Diagnosis Code: Personal History of Nicotine Dependence. U98.119 Information about tobacco cessation classes and interventions provided to patient. Yes Patient provided with "ticket" for LDCT Scan. yes Written Order for Lung Cancer Screening with LDCT placed in Epic. Yes (CT Chest Lung Cancer Screening Low Dose W/O CM) [id number] Z12.2-Screening of respiratory organs Z87.891-Personal history of nicotine dependence  I have spent 25 minutes of face to face/ virtual visit   time with  Janet Pierce discussing the risks and benefits of lung cancer screening. We viewed / discussed a power point together that explained in detail the above noted topics. We paused at intervals to allow for questions to be asked and answered to ensure understanding.We discussed that the single most powerful action that she can take to decrease her risk of developing lung cancer is to quit smoking. We discussed whether or not she is ready to commit to setting a quit date. We discussed options for tools to aid in quitting smoking including nicotine replacement therapy, non-nicotine medications, support groups, Quit Smart classes, and behavior modification. We discussed that often times setting smaller, more achievable goals, such as eliminating 1 cigarette a day for a week and then 2 cigarettes a day for a week can be helpful in slowly decreasing the number of cigarettes smoked. This allows for a sense of accomplishment as well as providing a clinical benefit. I provided  her  with smoking cessation  information  with contact information for community resources, classes, free nicotine  replacement therapy, and  access to mobile apps, text messaging, and on-line smoking cessation help. I have also provided  her  the office contact information in the event she needs to contact me, or the screening staff. We discussed the time and location of the scan, and that either Janet Glassman RN, Janet Prince, RN  or I will call / send a letter with the results within 24-72 hours of receiving them. The patient verbalized understanding of all of  the above and had no further questions upon leaving the office. They have my contact information in the event they have any further questions.  I spent 3 minutes counseling on smoking cessation and the health risks of continued tobacco abuse.  I explained to the patient that there has been a high incidence of coronary artery disease noted on these exams. I explained that this is a non-gated exam therefore degree or severity cannot be determined. This patient is on statin therapy. I have asked the patient to follow-up with their PCP regarding any incidental finding of coronary artery disease and management with diet or medication as their PCP  feels is clinically indicated. The patient verbalized understanding of the above and had no further questions upon completion of the visit.    Had quit in August 2023  but started smoking again in September 2023. She is working on quitting  Janet Spatz, NP 05/07/2022

## 2022-05-08 ENCOUNTER — Ambulatory Visit
Admission: RE | Admit: 2022-05-08 | Discharge: 2022-05-08 | Disposition: A | Discharge status: Home / Self Care | Payer: Medicare Other | Source: Ambulatory Visit | Attending: Acute Care | Admitting: Acute Care

## 2022-05-08 DIAGNOSIS — Z87891 Personal history of nicotine dependence: Secondary | ICD-10-CM

## 2022-05-08 DIAGNOSIS — Z122 Encounter for screening for malignant neoplasm of respiratory organs: Secondary | ICD-10-CM

## 2022-05-13 ENCOUNTER — Telehealth: Payer: Self-pay | Admitting: Acute Care

## 2022-05-13 DIAGNOSIS — E041 Nontoxic single thyroid nodule: Secondary | ICD-10-CM

## 2022-05-13 DIAGNOSIS — R911 Solitary pulmonary nodule: Secondary | ICD-10-CM

## 2022-05-13 DIAGNOSIS — Z87891 Personal history of nicotine dependence: Secondary | ICD-10-CM

## 2022-05-13 NOTE — Telephone Encounter (Signed)
I called Dr.Tabori's office  and spoke to [facility], She will message Dr. Birdie Riddle regarding US Thyroid. I provided my call back number for any questions or concerns.

## 2022-05-13 NOTE — Telephone Encounter (Signed)
Order placed for 6 mth nodule follow up lung screening CT.

## 2022-05-13 NOTE — Telephone Encounter (Signed)
See other telephone note from 05/13/22

## 2022-05-13 NOTE — Telephone Encounter (Signed)
I have attempted to call the patient with the results of their  Low Dose CT Chest Lung cancer screening scan. There was no answer. I have left a HIPPA compliant VM requesting the patient call the office for the scan results. I included the office contact information in the message. We will await their  return call. If no return call we will continue to call until patient is contacted.    LR 3, needs 6 month follow up.  Thyroid nodule needs non-urgent US Moderate CAD, Aortic atherosclerosis>> on statin therapy, last echo 2017

## 2022-05-13 NOTE — Addendum Note (Signed)
Addended by: Midge Minium on: 05/13/2022 12:30 PM   Modules accepted: Orders

## 2022-05-13 NOTE — Addendum Note (Signed)
Addended by: Doroteo Glassman D on: 05/13/2022 11:31 AM   Modules accepted: Orders

## 2022-05-13 NOTE — Telephone Encounter (Signed)
I have called the patient with the results of her low-dose screening CT.  I explained that her scan was read as a lung RADS 3.  72-monthfollow-up.  There is a pulmonary nodule in her right middle lobe that measures 9.3 mm with a 3.6 mm solid component.  She is in agreement with a 629-monthollow-up I also discussed the finding of a right-sided thyroid nodule measuring 1.7 cm .  Radiology is recommending a nonemergent thyroid ultrasound to further evaluate. We discussed findings of moderate coronary artery calcifications and aortic atherosclerosis.  Patient has statin medication on her med list however she states that she has not been taking it.  I reinforced that she should take this medication as it was prescribed for her, and recommended follow-up with her primary care doctor regarding possible cardiology consult, specifically for echo (last echo was done 2017) . I will call Dr. TaVirgil Benedictffice and asked them to order the nonemergent ultrasound of the thyroid. Patient verbalized the above and had no further questions at completion of the call [address]Langley Gausslease place order for 6-63-monthw-dose CT follow-up, and fax results to PCP. Thanks so much  DenLangley Gauss will contact Dr. TabVirgil Benedictfice about scheduling the non-emergent US.[place]

## 2022-05-13 NOTE — Telephone Encounter (Signed)
Thyroid US ordered.  They will call her to schedule

## 2022-05-13 NOTE — Telephone Encounter (Signed)
I am happy to follow up w/ her regarding the thyroid nodule and get her scheduled for an [place].  We will talk about Cards referral at her next appt.  Thank you for taking such great care of her!

## 2022-05-22 ENCOUNTER — Telehealth: Payer: Self-pay | Admitting: Family Medicine

## 2022-05-22 NOTE — Telephone Encounter (Signed)
Caller name: Barbar Brede  On DPR?: Yes  Call back number: [phone] (mobile)  Provider they see: Midge Minium, MD  Reason for call:  Patient called  states that she is due for a mammogram. Patient stated that [facility] [place] [facility] mammogram. [facility] told patient she would need a referral because stated that she been having some muscle spasm in her left breast. I scheduled patient to see Dr.Tabori tomorrow for that referral.

## 2022-05-23 ENCOUNTER — Ambulatory Visit: Payer: Medicare Other | Admitting: Family Medicine

## 2022-05-23 ENCOUNTER — Encounter: Payer: Self-pay | Admitting: Family Medicine

## 2022-05-23 VITALS — BP 128/64 | HR 82 | Temp 97.6°F | Ht 63.0 in | Wt 142.1 lb

## 2022-05-23 DIAGNOSIS — N644 Mastodynia: Secondary | ICD-10-CM | POA: Diagnosis not present

## 2022-05-23 NOTE — Progress Notes (Signed)
   Subjective:    Patient ID: Janet Pierce, female    DOB: May 14, 1950, 72 y.o.   MRN: [id number]  HPI Breast pain- intermittent L breast pain.  No lump.  Pain is at the top of the breast/chest wall.  Denies skin changes.   Review of Systems For ROS see HPI     Objective:   Physical Exam Vitals reviewed.  Constitutional:      General: She is not in acute distress.    Appearance: Normal appearance. She is not ill-appearing.  HENT:     Head: Normocephalic and atraumatic.  Chest:  Breasts:    Right: No swelling, mass, skin change or tenderness.     Left: Tenderness (mild TTP over upper L breast/chest wall) present. No swelling, mass or skin change.    Skin:    General: Skin is warm and dry.  Neurological:     General: No focal deficit present.     Mental Status: She is alert and oriented to person, place, and time.  Psychiatric:        Mood and Affect: Mood normal.        Behavior: Behavior normal.        Thought Content: Thought content normal.           Assessment & Plan:  L breast pain- new.  Will get diagnostic mammo to assess but suspect this is more suspensory ligament pain due to poor support.  Encouraged her to wear more supportive bra and see if pain improves.  Pt expressed understanding and is in agreement w/ plan.

## 2022-05-23 NOTE — Patient Instructions (Signed)
Follow up as needed or as scheduled We'll call you to schedule your diagnostic mammogram Try and keep the girls high and tight for better support Call with any questions or concerns Stay Safe!  Stay Healthy! Have a great weekend!

## 2022-06-04 ENCOUNTER — Other Ambulatory Visit: Payer: Self-pay

## 2022-06-04 ENCOUNTER — Telehealth: Payer: Self-pay | Admitting: Family Medicine

## 2022-06-04 MED ORDER — ATORVASTATIN CALCIUM 20 MG PO TABS: 20.0000 mg | ORAL_TABLET | Freq: Every day | ORAL | 3 refills | Status: DC

## 2022-06-04 NOTE — Telephone Encounter (Signed)
Rx sent to pharmacy   

## 2022-06-04 NOTE — Telephone Encounter (Signed)
Encourage patient to contact the pharmacy for refills or they can request refills through [facility]  (Please schedule appointment if patient has not been seen in over a year)    WHAT [place] THIS SENT TO: [facility] [address] and [place].   MEDICATION NAME & DOSE:atorvastatin (LIPITOR) 20 MG  NOTES/COMMENTS FROM PATIENT: Pt stated that she is out      [place] office please notify patient: It takes 48-72 hours to process rx refill requests Ask patient to call pharmacy to ensure rx is ready before heading there.

## 2022-06-10 ENCOUNTER — Telehealth: Payer: Self-pay | Admitting: Physical Medicine and Rehabilitation

## 2022-06-10 NOTE — Telephone Encounter (Signed)
Patient wants to set up appointment for injection. [id number]

## 2022-06-11 ENCOUNTER — Other Ambulatory Visit: Payer: Self-pay | Admitting: Physical Medicine and Rehabilitation

## 2022-06-11 ENCOUNTER — Other Ambulatory Visit: Payer: Self-pay | Admitting: Family Medicine

## 2022-06-11 DIAGNOSIS — M47816 Spondylosis without myelopathy or radiculopathy, lumbar region: Secondary | ICD-10-CM

## 2022-06-11 DIAGNOSIS — G8929 Other chronic pain: Secondary | ICD-10-CM

## 2022-06-11 DIAGNOSIS — N644 Mastodynia: Secondary | ICD-10-CM

## 2022-06-11 MED ORDER — DIAZEPAM 5 MG PO TABS: ORAL_TABLET | ORAL | 0 refills | Status: DC

## 2022-06-11 NOTE — Telephone Encounter (Signed)
Patient states her pain has returned. Her ablation was 08/2021. She is asking for a repeat. Please advise

## 2022-06-12 ENCOUNTER — Telehealth: Payer: Self-pay | Admitting: Physical Medicine and Rehabilitation

## 2022-06-12 NOTE — Telephone Encounter (Signed)
Spoke with patient and scheduled RFA for 06/23/22

## 2022-06-12 NOTE — Telephone Encounter (Signed)
Patient called asked for a call asked for a call back concerning her Rx. Patient said she was calling back.The number to contact patient is [phone]

## 2022-06-16 ENCOUNTER — Other Ambulatory Visit: Payer: Self-pay

## 2022-06-16 ENCOUNTER — Ambulatory Visit
Admission: RE | Admit: 2022-06-16 | Discharge: 2022-06-16 | Disposition: A | Discharge status: Home / Self Care | Payer: Medicare Other | Source: Ambulatory Visit | Attending: Family Medicine | Admitting: Family Medicine

## 2022-06-16 DIAGNOSIS — E041 Nontoxic single thyroid nodule: Secondary | ICD-10-CM

## 2022-06-16 DIAGNOSIS — K219 Gastro-esophageal reflux disease without esophagitis: Secondary | ICD-10-CM

## 2022-06-16 MED ORDER — PANTOPRAZOLE SODIUM 40 MG PO TBEC: 40.0000 mg | DELAYED_RELEASE_TABLET | Freq: Every day | ORAL | 1 refills | Status: DC

## 2022-06-17 ENCOUNTER — Encounter: Payer: Self-pay | Admitting: Family Medicine

## 2022-06-17 ENCOUNTER — Telehealth (INDEPENDENT_AMBULATORY_CARE_PROVIDER_SITE_OTHER): Payer: Medicare Other | Admitting: Family Medicine

## 2022-06-17 ENCOUNTER — Telehealth: Payer: Self-pay

## 2022-06-17 VITALS — Ht 63.0 in | Wt 143.0 lb

## 2022-06-17 DIAGNOSIS — L708 Other acne: Secondary | ICD-10-CM | POA: Diagnosis not present

## 2022-06-17 NOTE — Telephone Encounter (Signed)
Informed pt of [place] results

## 2022-06-17 NOTE — Telephone Encounter (Signed)
-----   Message from Midge Minium, MD sent at 06/17/2022  7:32 AM EST ----- The ultrasound shows that you have a goiter- a lumpy, bumpy thyroid.  Thankfully none of the nodules meet the criteria for any biopsies or follow up.  Everything appears benign (not cancerous) and not concerning.  Great news!

## 2022-06-17 NOTE — Progress Notes (Signed)
Virtual Visit via Video   I connected with patient on 06/17/22 at 11:00 AM EST by a video enabled telemedicine application and verified that I am speaking with the correct person using two identifiers.  Location patient: Home Location provider: [facility] Persons participating in the virtual visit: Patient, Provider, [place] Marcille Blanco C)  I discussed the limitations of evaluation and management by telemedicine and the availability of in person appointments. The patient expressed understanding and agreed to proceed.  Subjective:   HPI:   Rash- pt reports she woke up 3 days ago w/ a rash on her chin.  Area is not painful.  Pt reports prior to the rash, she squeezed a deep pimple on her chin.  Area that she squeezed is now hyperpigmented.  Pt reports the surrounding area is normal in color but pores are very wide.  No itching.  Pt has been applying triple abx ointment.  Prior to the abx ointment cleaned w/ peroxide and applied vaseline.  Pt reports chin feels somewhat swollen.    ROS:   See pertinent positives and negatives per HPI.  Patient Active Problem List   Diagnosis Date Noted   Osteoporosis 09/28/2019   Vitamin D deficiency 07/08/2018   Benign paroxysmal positional vertigo 05/30/2015   Bunion, right foot 06/01/2014   Tobacco abuse 11/25/2013   Dysphagia 05/19/2013   Special screening for malignant neoplasms, colon 05/19/2013   GERD (gastroesophageal reflux disease) 04/04/2013   Allergic rhinitis 04/04/2013   General medical examination 09/11/2010   PREMATURE VENTRICULAR CONTRACTIONS 11/06/2009   SUPRAVENTRICULAR TACHYCARDIA 10/05/2009   LEUKOCYTOPENIA UNSPECIFIED 09/10/2009   DEPRESSIVE DISORDER 09/06/2009   ECZEMA 09/06/2009   PALPITATIONS, OCCASIONAL 06/08/2009   SHOULDER PAIN, LEFT 05/24/2009   BACK PAIN 05/24/2009   SCOLIOSIS 05/24/2009   LIPOMA 02/02/2008   Hyperlipidemia 02/02/2008   HYPERTENSION, BENIGN ESSENTIAL 02/02/2008    Social History    Tobacco Use   Smoking status: Every Day    Packs/day: 0.50    Years: 40.00    Total pack years: 20.00    Types: Cigarettes   Smokeless tobacco: Never   Tobacco comments:    Resumed smoking in Sept 2023  Substance Use Topics   Alcohol use: Yes    Alcohol/week: 0.0 standard drinks of alcohol    Comment: occasionally    Current Outpatient Medications:    amLODipine (NORVASC) 10 MG tablet, Take 1 tablet (10 mg total) by mouth daily before breakfast., Disp: 90 tablet, Rfl: 3   atorvastatin (LIPITOR) 20 MG tablet, Take 1 tablet (20 mg total) by mouth daily., Disp: 90 tablet, Rfl: 3   brimonidine (ALPHAGAN P) 0.1 % SOLN, Place 1 drop into the right eye in the morning and at bedtime., Disp: , Rfl:    diazepam (VALIUM) 5 MG tablet, Take one tablet by mouth with food one hour prior to procedure. May repeat 30 minutes prior if needed. (Patient not taking: Reported on 06/17/2022), Disp: 2 tablet, Rfl: 0   dorzolamide (TRUSOPT) 2 % ophthalmic solution, INSTILL 1 DROP INTO BOTH EYES BID, Disp: , Rfl: 1   LUMIGAN 0.01 % SOLN, 1 drop at bedtime., Disp: , Rfl:    Multiple Vitamins-Minerals (MULTIVITAMIN GUMMIES WOMENS) CHEW, Chew 2 Doses by mouth every morning., Disp: , Rfl:    pantoprazole (PROTONIX) 40 MG tablet, Take 1 tablet (40 mg total) by mouth daily., Disp: 90 tablet, Rfl: 1   timolol (TIMOPTIC) 0.5 % ophthalmic solution, Place 1 drop into the right eye 2 (two) times  daily. , Disp: , Rfl:    Vitamin D, Ergocalciferol, (DRISDOL) 1.25 MG (50000 UNIT) CAPS capsule, Take 1 capsule (50,000 Units total) by mouth every 7 (seven) days., Disp: 12 capsule, Rfl: 1   XDEMVY 0.25 % SOLN, Apply 1 drop to eye 2 (two) times daily., Disp: , Rfl:    sucralfate (CARAFATE) 1 g tablet, Take 1 tablet (1 g total) by mouth 3 (three) times daily with meals. (Patient not taking: Reported on 06/17/2022), Disp: 90 tablet, Rfl: 0  No Known Allergies  Objective:   Ht '5\' 3"'$  (1.6 m)   Wt 143 lb (64.9 kg)   BMI 25.33  kg/m  AAOx3, NAD NCAT, EOMI No obvious CN deficits Coloring WNL Hyperpigmented, raised, area on chin consistent w/ deep pimple Pt is able to speak clearly, coherently without shortness of breath or increased work of breathing.  Thought process is linear.  Mood is appropriate.   Assessment and Plan:   Acne like skin bump- new.  Unfortunately the camera quality was not good for our visit and I did not get a good look at the area in question.  What I could see and what she described appeared to be a raised, hyperpigmented lesion on her chin that she reports squeezing like a pimple a few days ago.  She denies any surrounding redness or pain that would suggest infection.  At this time, encouraged her to avoid picking or squeezing and to use hot compresses to hopefully bring the area to a head and improve the inflammation.  If area gets red or painful, she is to message me so I can start abx for cellulitis.  Pt expressed understanding and is in agreement w/ plan.    [name], MD 06/17/2022

## 2022-06-23 ENCOUNTER — Ambulatory Visit: Payer: Self-pay

## 2022-06-23 ENCOUNTER — Ambulatory Visit: Payer: Medicare Other | Admitting: Physical Medicine and Rehabilitation

## 2022-06-23 VITALS — BP 123/76 | HR 80

## 2022-06-23 DIAGNOSIS — M47816 Spondylosis without myelopathy or radiculopathy, lumbar region: Secondary | ICD-10-CM

## 2022-06-23 MED ORDER — METHYLPREDNISOLONE ACETATE 80 MG/ML IJ SUSP
80.0000 mg | Freq: Once | INTRAMUSCULAR | Status: AC
  Administered 2022-06-23: 80 mg

## 2022-06-23 NOTE — Progress Notes (Signed)
Functional Pain Scale - descriptive words and definitions  Moderate (4)   Constantly aware of pain, can complete ADLs with modification/sleep marginally affected at times/passive distraction is of no use, but active distraction gives some relief. Moderate range order  Average Pain 4-8   +Driver, -BT, -Dye Allergies.  Lower back pain on left side

## 2022-06-23 NOTE — Patient Instructions (Signed)

## 2022-06-24 ENCOUNTER — Ambulatory Visit
Admission: RE | Admit: 2022-06-24 | Discharge: 2022-06-24 | Disposition: A | Discharge status: Home / Self Care | Payer: Medicare Other | Source: Ambulatory Visit | Attending: Family Medicine | Admitting: Family Medicine

## 2022-06-24 ENCOUNTER — Ambulatory Visit: Payer: Medicare Other

## 2022-06-24 DIAGNOSIS — N644 Mastodynia: Secondary | ICD-10-CM

## 2022-06-30 ENCOUNTER — Telehealth: Payer: Self-pay | Admitting: Family Medicine

## 2022-06-30 NOTE — Procedures (Signed)
Lumbar Facet Joint Nerve Denervation  [name]      Date of Birth: February 08, 1951 MRN: [id number] PCP: Midge Minium, MD      Visit Date: 06/23/2022   Universal Protocol:    Date/Time: 03/11/245:17 AM  Consent Given By: the patient  Position: PRONE  Additional Comments: Vital signs were monitored before and after the procedure. Patient was prepped and draped in the usual sterile fashion. The correct patient, procedure, and site was verified.   Injection Procedure Details:   Procedure diagnoses:  1. Spondylosis without myelopathy or radiculopathy, lumbar region      Meds Administered:  Meds ordered this encounter  Medications   methylPREDNISolone acetate (DEPO-MEDROL) injection 80 mg     Laterality: Left  Location/Site:  L4-L5, L3 and L4 medial branches and L5-S1, L4 medial branch and L5 dorsal ramus  Needle: 18 ga.,  59m active tip, 1053mRF Cannula  Needle Placement: Along juncture of superior articular process and transverse pocess  Findings:  -Comments:  Procedure Details: For each desired target nerve, the corresponding transverse process (sacral ala for the L5 dorsal rami) was identified and the fluoroscope was positioned to square off the endplates of the corresponding vertebral body to achieve a true AP midline view.  The beam was then obliqued 15 to 20 degrees and caudally tilted 15 to 20 degrees to line up a trajectory along the target nerves. The skin over the target of the junction of superior articulating process and transverse process (sacral ala for the L5 dorsal rami) was infiltrated with 36m26mf 1% Lidocaine without Epinephrine.  The 18 gauge 27m17mtive tip outer cannula was advanced in trajectory view to the target.  This procedure was repeated for each target nerve.  Then, for all levels, the outer cannula placement was fine-tuned and the position was then confirmed with bi-planar imaging.    Test stimulation was done both at sensory  and motor levels to ensure there was no radicular stimulation. The target tissues were then infiltrated with 1 ml of 1% Lidocaine without Epinephrine. Subsequently, a percutaneous neurotomy was carried out for 90 seconds at 80 degrees Celsius.  After the completion of the lesion, 1 ml of injectate was delivered. It was then repeated for each facet joint nerve mentioned above. Appropriate radiographs were obtained to verify the probe placement during the neurotomy.   Additional Comments:  No complications occurred Dressing: 2 x 2 sterile gauze and Band-Aid    Post-procedure details: Patient was observed during the procedure. Post-procedure instructions were reviewed.  Patient left the clinic in stable condition.

## 2022-06-30 NOTE — Telephone Encounter (Signed)
Called patient no answer, LM for patient to call back and schedule her appt for her wheezing.

## 2022-06-30 NOTE — Telephone Encounter (Signed)
Caller name: [name]  On DPR?: Yes  Call back number: [phone] (mobile)  Provider they see: Midge Minium, MD  Reason for call: Pt is wheezing 3 days ago back pain started -no fall -advise

## 2022-06-30 NOTE — Progress Notes (Signed)
Janet Pierce - 72 y.o. female MRN [id number]  Date of birth: 1950/06/21  Office Visit Note: Visit Date: 06/23/2022 PCP: Midge Minium, MD Referred by: Midge Minium, MD  Subjective: Chief Complaint  Patient presents with   Lower Back - Pain   HPI:  Janet Pierce is a 72 y.o. female who comes in todayfor planned radiofrequency ablation of the Left L4-5 and L5-S1 Lumbar facet joints. This would be ablation of the corresponding medial branches and/or dorsal rami.  Patient has had double diagnostic blocks with more than 50% relief.  These are documented on pain diary.  They have had chronic back pain for quite some time, more than 3 months, which has been an ongoing situation with recalcitrant axial back pain.  They have no radicular pain.  Their axial pain is worse with standing and ambulating and on exam today with facet loading.  They have had physical therapy as well as home exercise program.  The imaging noted in the chart below indicated facet pathology. Accordingly they meet all the criteria and qualification for for radiofrequency ablation and we are going to complete this today hopefully for more longer term relief as part of comprehensive management program.   ROS Otherwise per HPI.  Assessment & Plan: Visit Diagnoses:    ICD-10-CM   1. Spondylosis without myelopathy or radiculopathy, lumbar region  M47.816 XR C-ARM NO REPORT    Radiofrequency,Lumbar    methylPREDNISolone acetate (DEPO-MEDROL) injection 80 mg      Plan: No additional findings.   Meds & Orders:  Meds ordered this encounter  Medications   methylPREDNISolone acetate (DEPO-MEDROL) injection 80 mg    Orders Placed This Encounter  Procedures   Radiofrequency,Lumbar   XR C-ARM NO REPORT    Follow-up: Return for visit to requesting provider as needed.   Procedures: No procedures performed  Lumbar Facet Joint Nerve Denervation  [name]      Date of Birth: 1950/11/27 MRN:  [id number] PCP: Midge Minium, MD      Visit Date: 06/23/2022   Universal Protocol:    Date/Time: 03/11/245:17 AM  Consent Given By: the patient  Position: PRONE  Additional Comments: Vital signs were monitored before and after the procedure. Patient was prepped and draped in the usual sterile fashion. The correct patient, procedure, and site was verified.   Injection Procedure Details:   Procedure diagnoses:  1. Spondylosis without myelopathy or radiculopathy, lumbar region      Meds Administered:  Meds ordered this encounter  Medications   methylPREDNISolone acetate (DEPO-MEDROL) injection 80 mg     Laterality: Left  Location/Site:  L4-L5, L3 and L4 medial branches and L5-S1, L4 medial branch and L5 dorsal ramus  Needle: 18 ga.,  69m active tip, 107mRF Cannula  Needle Placement: Along juncture of superior articular process and transverse pocess  Findings:  -Comments:  Procedure Details: For each desired target nerve, the corresponding transverse process (sacral ala for the L5 dorsal rami) was identified and the fluoroscope was positioned to square off the endplates of the corresponding vertebral body to achieve a true AP midline view.  The beam was then obliqued 15 to 20 degrees and caudally tilted 15 to 20 degrees to line up a trajectory along the target nerves. The skin over the target of the junction of superior articulating process and transverse process (sacral ala for the L5 dorsal rami) was infiltrated with 26m24mf 1% Lidocaine without Epinephrine.  The 18 gauge 67m73mtive tip  outer cannula was advanced in trajectory view to the target.  This procedure was repeated for each target nerve.  Then, for all levels, the outer cannula placement was fine-tuned and the position was then confirmed with bi-planar imaging.    Test stimulation was done both at sensory and motor levels to ensure there was no radicular stimulation. The target tissues were then  infiltrated with 1 ml of 1% Lidocaine without Epinephrine. Subsequently, a percutaneous neurotomy was carried out for 90 seconds at 80 degrees Celsius.  After the completion of the lesion, 1 ml of injectate was delivered. It was then repeated for each facet joint nerve mentioned above. Appropriate radiographs were obtained to verify the probe placement during the neurotomy.   Additional Comments:  No complications occurred Dressing: 2 x 2 sterile gauze and Band-Aid    Post-procedure details: Patient was observed during the procedure. Post-procedure instructions were reviewed.  Patient left the clinic in stable condition.      Clinical History: No specialty comments available.     Objective:  VS:  HT:    WT:   BMI:     BP:123/76  HR:80bpm  TEMP: ( )  RESP:  Physical Exam Vitals and nursing note reviewed.  Constitutional:      General: She is not in acute distress.    Appearance: Normal appearance. She is not ill-appearing.  HENT:     Head: Normocephalic and atraumatic.     Right Ear: External ear normal.     Left Ear: External ear normal.  Eyes:     Extraocular Movements: Extraocular movements intact.  Cardiovascular:     Rate and Rhythm: Normal rate.     Pulses: Normal pulses.  Pulmonary:     Effort: Pulmonary effort is normal. No respiratory distress.  Abdominal:     General: There is no distension.     Palpations: Abdomen is soft.  Musculoskeletal:        General: Tenderness present.     Cervical back: Neck supple.     Right lower leg: No edema.     Left lower leg: No edema.     Comments: Patient has good distal strength with no pain over the greater trochanters.  No clonus or focal weakness.  Skin:    Findings: No erythema, lesion or rash.  Neurological:     General: No focal deficit present.     Mental Status: She is alert and oriented to person, place, and time.     Sensory: No sensory deficit.     Motor: No weakness or abnormal muscle tone.      Coordination: Coordination normal.  Psychiatric:        Mood and Affect: Mood normal.        Behavior: Behavior normal.      Imaging: No results found.

## 2022-07-01 NOTE — Telephone Encounter (Signed)
Pt is scheduled °

## 2022-07-02 ENCOUNTER — Ambulatory Visit: Payer: Medicare Other | Admitting: Family Medicine

## 2022-07-02 ENCOUNTER — Encounter: Payer: Self-pay | Admitting: Family Medicine

## 2022-07-02 VITALS — BP 128/74 | HR 91 | Temp 98.0°F | Resp 18 | Ht 63.0 in | Wt 136.5 lb

## 2022-07-02 DIAGNOSIS — J40 Bronchitis, not specified as acute or chronic: Secondary | ICD-10-CM | POA: Diagnosis not present

## 2022-07-02 MED ORDER — AZITHROMYCIN 250 MG PO TABS: ORAL_TABLET | ORAL | 0 refills | Status: DC

## 2022-07-02 MED ORDER — CETIRIZINE HCL 10 MG PO TABS: 10.0000 mg | ORAL_TABLET | Freq: Every day | ORAL | 6 refills | Status: DC

## 2022-07-02 MED ORDER — ALBUTEROL SULFATE HFA 108 (90 BASE) MCG/ACT IN AERS: 2.0000 | INHALATION_SPRAY | Freq: Four times a day (QID) | RESPIRATORY_TRACT | 0 refills | Status: DC | PRN

## 2022-07-02 NOTE — Patient Instructions (Signed)
Follow up as needed or as scheduled START the Zpack- 2 pills today, then 1 pill for the next 4 days USE the Albuterol inhaler- 2 puffs every 4 hrs- as needed for cough/wheezing Mucinex DM to help w/ cough and congestion (available over the counter) TAKE a daily Cetirizine (Zyrtec) to help w/ the congestion and postnasal drip Drink LOTS of fluids REST STOP SMOKING!!!  You can do it!! Hang in there!!

## 2022-07-02 NOTE — Progress Notes (Signed)
   Subjective:    Patient ID: Janet Pierce, female    DOB: [birth year], 72 y.o.   MRN: [id number]  HPI Wheezing- sxs started 1 week ago w/ scratchy throat and cough.  Then developed nasal congestion.  Pt reports she is now wheezing at night.  Denies SOB.  Cough is productive of yellow sputum.  Took Mucinex on Monday and Tuesday w/ some relief.  Cough causes rib/back pain.  No fever, no sore throat.  Denies sinus pain/pressure.  No HA.   Review of Systems For ROS see HPI     Objective:   Physical Exam Vitals reviewed.  Constitutional:      General: She is not in acute distress.    Appearance: Normal appearance. She is well-developed. She is not ill-appearing.  HENT:     Head: Normocephalic and atraumatic.     Right Ear: Tympanic membrane and ear canal normal.     Left Ear: Tympanic membrane and ear canal normal.     Nose: Congestion present. No rhinorrhea.     Comments: No TTP over frontal or maxillary sinuses    Mouth/Throat:     Mouth: Mucous membranes are moist.     Pharynx: Posterior oropharyngeal erythema (w/ PND) present. No oropharyngeal exudate.  Eyes:     Conjunctiva/sclera: Conjunctivae normal.     Pupils: Pupils are equal, round, and reactive to light.  Cardiovascular:     Rate and Rhythm: Normal rate and regular rhythm.     Heart sounds: Normal heart sounds. No murmur heard. Pulmonary:     Effort: Pulmonary effort is normal. No respiratory distress.     Breath sounds: Wheezing (diffuse inspiratory and expiratory wheezes) present.     Comments: Dry, hacking cough Musculoskeletal:     Cervical back: Normal range of motion and neck supple.  Lymphadenopathy:     Cervical: No cervical adenopathy.  Skin:    General: Skin is warm and dry.  Neurological:     General: No focal deficit present.     Mental Status: She is alert and oriented to person, place, and time.  Psychiatric:        Mood and Affect: Mood normal.        Behavior: Behavior normal.        Thought  Content: Thought content normal.           Assessment & Plan:  Bronchitis- new.  Pt w/ diffuse inspiratory and expiratory wheezing.  She is currently following w/ Pulmonary for multiple lung nodules so unclear if there is an underlying process, but bc of this, will start Zpack to cover both typical and atypical infxn.  Start albuterol prn.  Encouraged her to stop smoking.  Cough meds prn.  Start a daily Zyrtec to decrease contribution from seasonal allergies.  Pt expressed understanding and is in agreement w/ plan.

## 2022-07-15 ENCOUNTER — Telehealth: Payer: Self-pay | Admitting: Family Medicine

## 2022-07-15 NOTE — Telephone Encounter (Signed)
Caller name: Lovis Bonis  On DPR?: Yes  Call back number: [phone] (mobile)  Provider they see: Midge Minium, MD  Reason for call: Pt states she's still wheezing not coughing no fever. She's taken a Mucinex steroids cough syrup (Robitussin DM) and Albuterol.  Phelgm is clear. -advise

## 2022-07-15 NOTE — Telephone Encounter (Signed)
Left pt a detailed VM  stating she will need to call and schedule an apt if she is still wheezing

## 2022-07-22 IMAGING — MG MM DIGITAL SCREENING BILAT W/ TOMO AND CAD
8 series · 8 of 24 positions shown · non-contrast
Comparison: Previous exam(s).

CLINICAL DATA: Screening.

EXAM:
DIGITAL SCREENING BILATERAL MAMMOGRAM WITH TOMOSYNTHESIS AND CAD
TECHNIQUE: Bilateral screening digital craniocaudal and mediolateral oblique
mammograms were obtained. Bilateral screening digital breast
tomosynthesis was performed. The images were evaluated with
computer-aided detection.

[L MLO synth-2D]
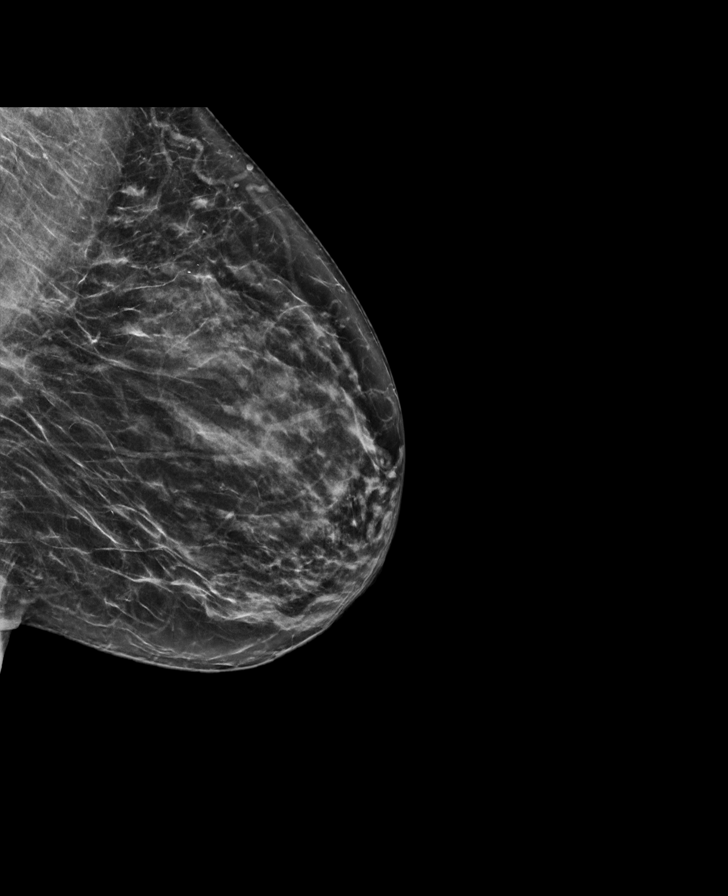

[R CC synth-2D]
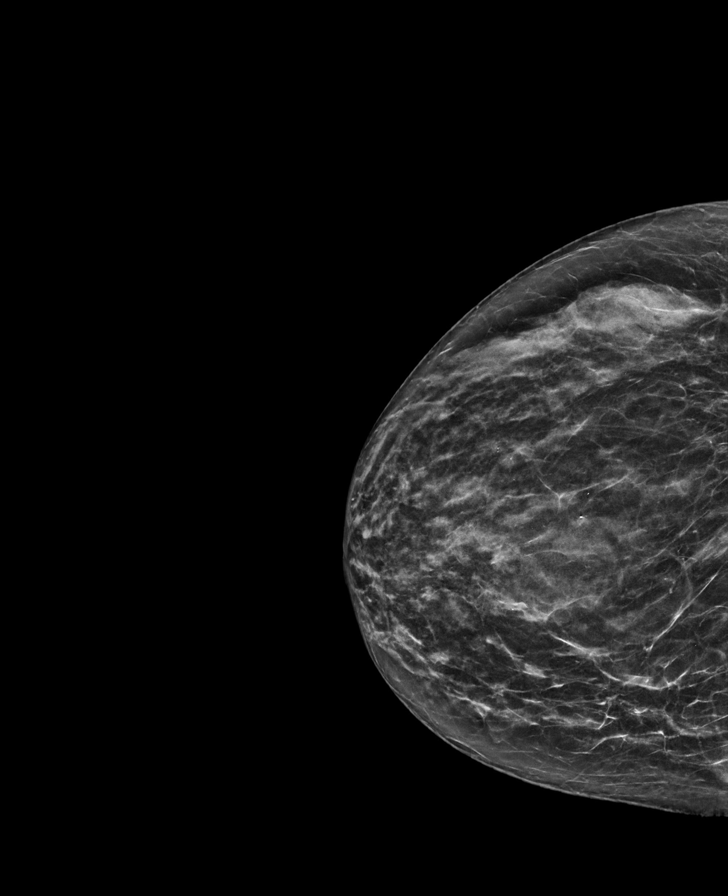

[R MLO synth-2D]
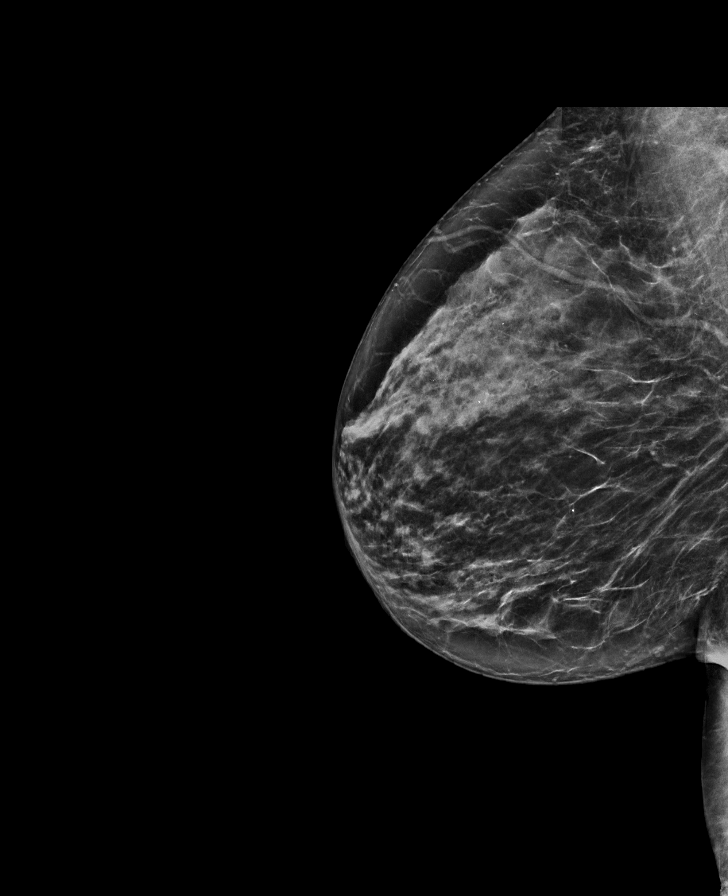

[L CC synth-2D]
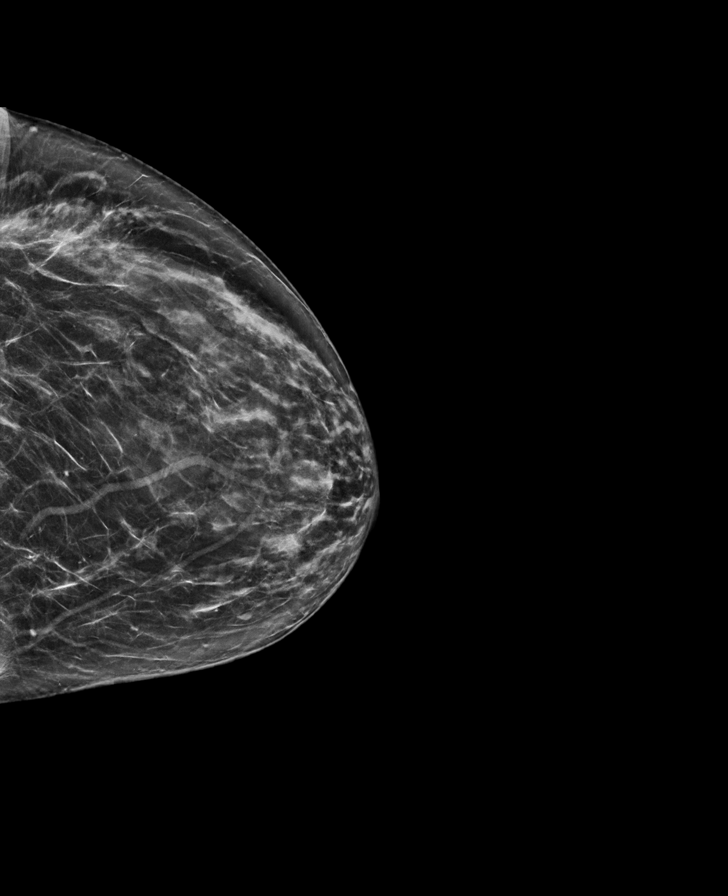

[R MLO tomo · tomo slice 34/67.0]
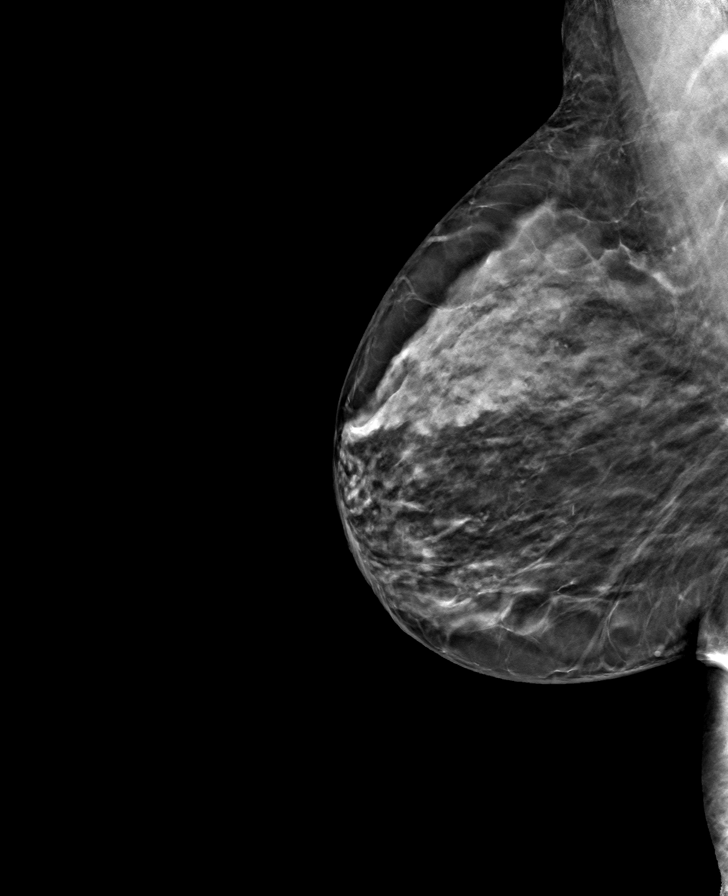

[R CC tomo · tomo slice 29/57.0]
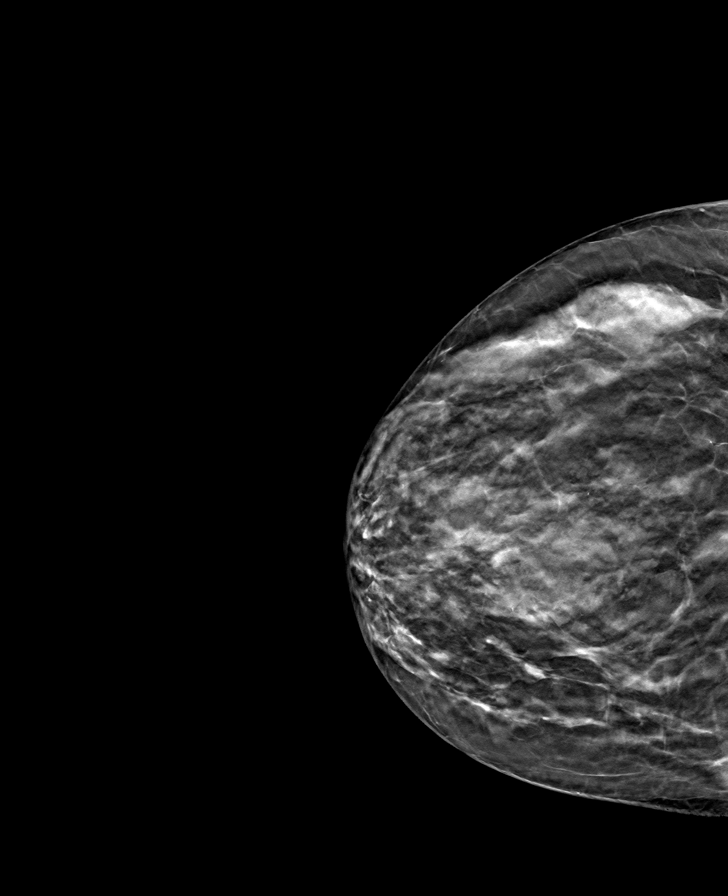

[L MLO tomo · tomo slice 33/66.0]
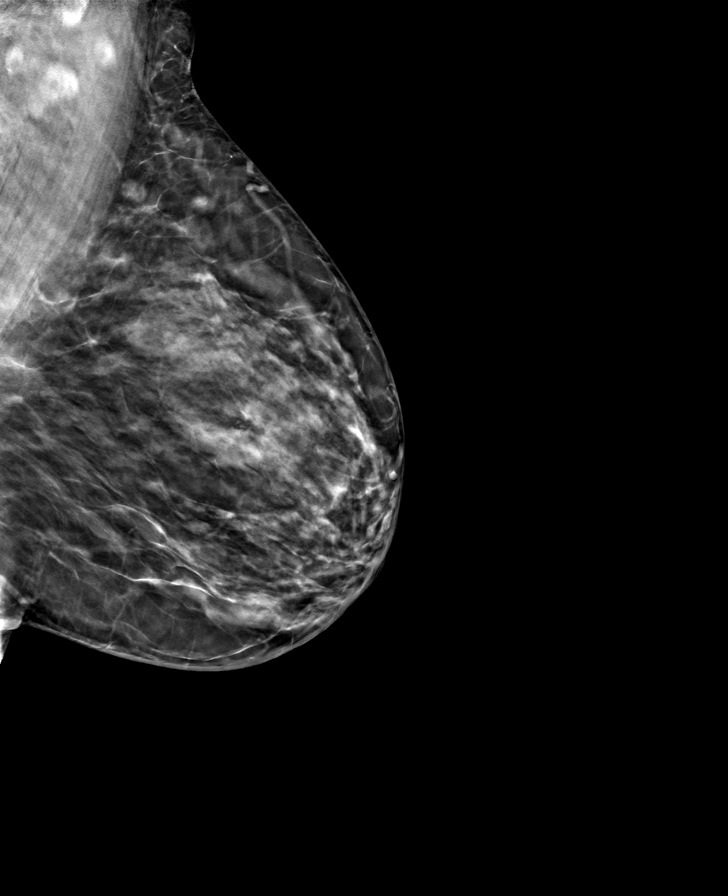

[L CC tomo · tomo slice 33/65.0]
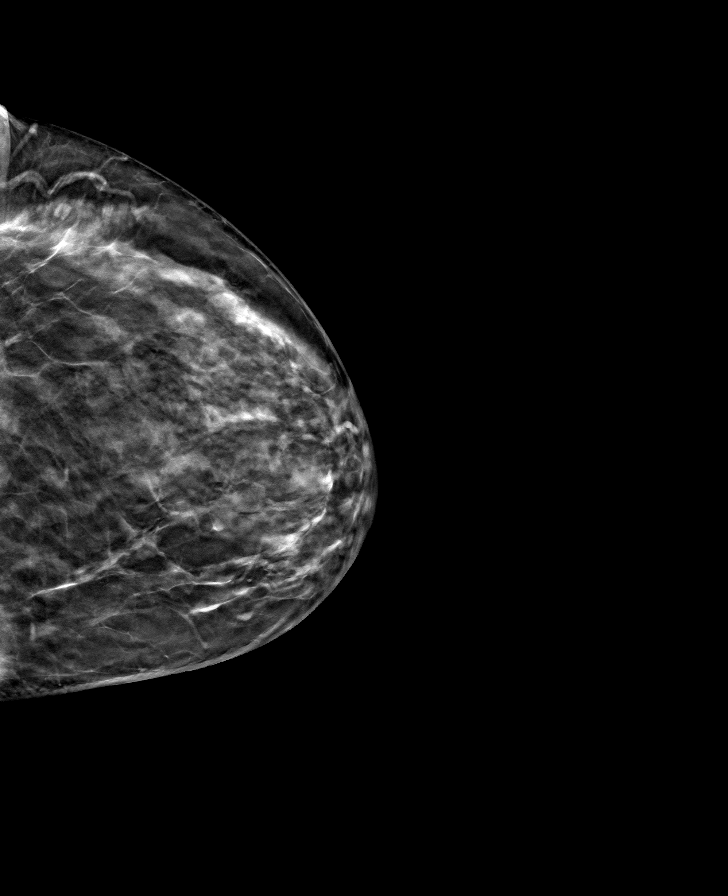

[8 of 24 positions shown; findings below may reference images not displayed]

ACR Breast Density Category c: The breast tissue is heterogeneously
dense, which may obscure small masses.
FINDINGS: There are no findings suspicious for malignancy.
IMPRESSION: No mammographic evidence of malignancy. A result letter of this
screening mammogram will be mailed directly to the patient.

RECOMMENDATION:
Screening mammogram in one year. (Code:Q3-W-BC3)

BI-RADS CATEGORY  1: Negative.

## 2022-08-03 ENCOUNTER — Other Ambulatory Visit: Payer: Self-pay | Admitting: Family Medicine

## 2022-08-03 DIAGNOSIS — R7989 Other specified abnormal findings of blood chemistry: Secondary | ICD-10-CM

## 2022-08-21 ENCOUNTER — Ambulatory Visit: Payer: Medicare Other | Admitting: Family Medicine

## 2022-08-21 ENCOUNTER — Encounter: Payer: Self-pay | Admitting: Family Medicine

## 2022-08-21 VITALS — BP 124/70 | HR 92 | Temp 98.4°F | Ht 63.0 in | Wt 140.8 lb

## 2022-08-21 DIAGNOSIS — E785 Hyperlipidemia, unspecified: Secondary | ICD-10-CM

## 2022-08-21 DIAGNOSIS — N644 Mastodynia: Secondary | ICD-10-CM | POA: Diagnosis not present

## 2022-08-21 LAB — HEPATIC FUNCTION PANEL
ALT: 26 U/L (ref 0–35)
AST: 25 U/L (ref 0–37)
Albumin: 4 g/dL (ref 3.5–5.2)
Alkaline Phosphatase: 62 U/L (ref 39–117)
Bilirubin, Direct: 0.1 mg/dL (ref 0.0–0.3)
Total Bilirubin: 0.6 mg/dL (ref 0.2–1.2)
Total Protein: 7 g/dL (ref 6.0–8.3)

## 2022-08-21 LAB — CBC WITH DIFFERENTIAL/PLATELET
Basophils Absolute: 0.1 10*3/uL (ref 0.0–0.1)
Basophils Relative: 1.4 % (ref 0.0–3.0)
Eosinophils Absolute: 0.1 10*3/uL (ref 0.0–0.7)
Eosinophils Relative: 3.3 % (ref 0.0–5.0)
HCT: 35.4 % — ABNORMAL LOW (ref 36.0–46.0)
Hemoglobin: 11.8 g/dL — ABNORMAL LOW (ref 12.0–15.0)
Lymphocytes Relative: 38.9 % (ref 12.0–46.0)
Lymphs Abs: 1.5 10*3/uL (ref 0.7–4.0)
MCHC: 33.2 g/dL (ref 30.0–36.0)
MCV: 83.5 fl (ref 78.0–100.0)
Monocytes Absolute: 0.4 10*3/uL (ref 0.1–1.0)
Monocytes Relative: 9.4 % (ref 3.0–12.0)
Neutro Abs: 1.8 10*3/uL (ref 1.4–7.7)
Neutrophils Relative %: 47 % (ref 43.0–77.0)
Platelets: 270 10*3/uL (ref 150.0–400.0)
RBC: 4.25 Mil/uL (ref 3.87–5.11)
RDW: 14.2 % (ref 11.5–15.5)
WBC: 3.9 10*3/uL — ABNORMAL LOW (ref 4.0–10.5)

## 2022-08-21 LAB — LIPID PANEL
Cholesterol: 162 mg/dL (ref 0–200)
HDL: 48.3 mg/dL (ref >39.00)
LDL Cholesterol: 93 mg/dL (ref 0–99)
NonHDL: 113.52
Total CHOL/HDL Ratio: 3
Triglycerides: 104 mg/dL (ref 0.0–149.0)
VLDL: 20.8 mg/dL (ref 0.0–40.0)

## 2022-08-21 LAB — BASIC METABOLIC PANEL
BUN: 13 mg/dL (ref 6–23)
CO2: 31 mEq/L (ref 19–32)
Calcium: 9.5 mg/dL (ref 8.4–10.5)
Chloride: 101 mEq/L (ref 96–112)
Creatinine, Ser: 0.75 mg/dL (ref 0.40–1.20)
GFR: 80.1 mL/min (ref >60.00)
Glucose, Bld: 98 mg/dL (ref 70–99)
Potassium: 3.8 mEq/L (ref 3.5–5.1)
Sodium: 139 mEq/L (ref 135–145)

## 2022-08-21 LAB — TSH: TSH: 2.5 u[IU]/mL (ref 0.35–5.50)

## 2022-08-21 NOTE — Patient Instructions (Signed)
Schedule your complete physical in 3 months We'll notify you of your lab results and make any changes if needed Keep a log of your symptoms if they come back- when it occurs, how long it lasts, etc Call with any questions or concerns Stay Safe!  Stay Healthy! Have a great summer!!!

## 2022-08-21 NOTE — Progress Notes (Signed)
   Subjective:    Patient ID: Janet Pierce, female    DOB: 05/17/1950, 72 y.o.   MRN: [id number]  HPI L breast pain- Pt reports starting 2 weeks ago she has had a shooting pain across her L breast.  Pt was under a lot of stress last week.  Ibuprofen relieved sxs.  Last time she took medication was Sunday night.  Has not needed it since.  She has had chest CT, diagnostic mammo- all WNL.  Pt reports pain has resolved.     Review of Systems For ROS see HPI     Objective:   Physical Exam Constitutional:      General: She is not in acute distress.    Appearance: She is well-developed.  HENT:     Head: Normocephalic and atraumatic.  Eyes:     Conjunctiva/sclera: Conjunctivae normal.     Pupils: Pupils are equal, round, and reactive to light.  Neck:     Thyroid: No thyromegaly.  Cardiovascular:     Rate and Rhythm: Normal rate and regular rhythm.     Heart sounds: Normal heart sounds. No murmur heard. Pulmonary:     Effort: Pulmonary effort is normal. No respiratory distress.     Breath sounds: Normal breath sounds.  Abdominal:     General: There is no distension.     Palpations: Abdomen is soft.     Tenderness: There is no abdominal tenderness.  Musculoskeletal:     Cervical back: Normal range of motion and neck supple.     Right lower leg: No edema.     Left lower leg: No edema.  Lymphadenopathy:     Cervical: No cervical adenopathy.  Skin:    General: Skin is warm and dry.  Neurological:     General: No focal deficit present.     Mental Status: She is alert and oriented to person, place, and time.  Psychiatric:        Mood and Affect: Mood normal.        Behavior: Behavior normal.        Thought Content: Thought content normal.           Assessment & Plan:   L breast pain- new.  Pt has had chest CT and diagnostic mammo that were both WNL.  Pain has completely resolved and she last had it 4 days ago.  At the time she was under a lot of stress and is not sure if  that's related.  When she was having pain, it resolved w/ ibuprofen.  Encouraged her to log her sxs if they return so we can determine if there's a pattern.  Pt expressed understanding and is in agreement w/ plan.

## 2022-08-22 ENCOUNTER — Telehealth: Payer: Self-pay

## 2022-08-22 NOTE — Telephone Encounter (Signed)
Left vm stating lab results  

## 2022-08-22 NOTE — Telephone Encounter (Signed)
-----   Message from Sheliah Hatch, MD sent at 08/22/2022  7:43 AM EDT ----- Labs look great!  No changes at this time

## 2022-08-25 ENCOUNTER — Other Ambulatory Visit: Payer: Self-pay | Admitting: Family Medicine

## 2022-08-25 DIAGNOSIS — R7989 Other specified abnormal findings of blood chemistry: Secondary | ICD-10-CM

## 2022-08-25 NOTE — Telephone Encounter (Signed)
Would you like to refill or is the refill not appropriate?

## 2022-09-04 ENCOUNTER — Other Ambulatory Visit: Payer: Self-pay | Admitting: Family Medicine

## 2022-09-04 DIAGNOSIS — R7989 Other specified abnormal findings of blood chemistry: Secondary | ICD-10-CM

## 2022-09-19 NOTE — Assessment & Plan Note (Signed)
Check labs to risk stratify given that she has had CP.  Adjust meds prn.

## 2022-10-17 ENCOUNTER — Encounter: Payer: Self-pay | Admitting: Family Medicine

## 2022-10-17 ENCOUNTER — Ambulatory Visit: Payer: Medicare Other | Admitting: Family Medicine

## 2022-10-17 VITALS — BP 126/80 | HR 90 | Temp 98.9°F | Resp 16 | Ht 63.0 in | Wt 140.4 lb

## 2022-10-17 DIAGNOSIS — M79632 Pain in left forearm: Secondary | ICD-10-CM | POA: Diagnosis not present

## 2022-10-17 DIAGNOSIS — M7989 Other specified soft tissue disorders: Secondary | ICD-10-CM | POA: Diagnosis not present

## 2022-10-17 MED ORDER — PREDNISONE 10 MG PO TABS: ORAL_TABLET | ORAL | 0 refills | Status: DC

## 2022-10-17 NOTE — Progress Notes (Signed)
   Subjective:    Patient ID: Janet Pierce, female    DOB: Feb 10, 1951, 72 y.o.   MRN: [id number]  HPI L arm pain- pt reports she developed L forearm pain earlier this week.  Noticed yesterday that area was swollen.  Swelling improved somewhat w/ ice but returns.  Pt lifted cases of water and noted that area was painful.  Reports that she also moved furniture- she had just forgotten   Review of Systems For ROS see HPI     Objective:   Physical Exam Vitals reviewed.  Constitutional:      General: She is not in acute distress.    Appearance: Normal appearance. She is not ill-appearing.  HENT:     Head: Normocephalic and atraumatic.  Cardiovascular:     Pulses: Normal pulses.  Musculoskeletal:        General: Swelling and tenderness (over L forearm) present. No signs of injury.  Skin:    General: Skin is warm and dry.  Neurological:     General: No focal deficit present.     Mental Status: She is alert and oriented to person, place, and time.  Psychiatric:        Mood and Affect: Mood normal.        Behavior: Behavior normal.        Thought Content: Thought content normal.           Assessment & Plan:   Pain and swelling L forearm- new.  Appears to be a muscle strain/tear w/ obvious swelling but not nearly as much pain as I would expect.  No difficulty w/ ROM which is also odd.  Will start Prednisone taper for pain and inflammation.  Encouraged ice.  Will refer to Sports Med for complete evaluation and tx.  Pt expressed understanding and is in agreement w/ plan.

## 2022-10-17 NOTE — Patient Instructions (Signed)
Follow up as needed or as scheduled We'll call you to schedule your Sports Med appt START the Prednisone as directed- 3 pills at the same time x3 days, then 2 pills at the same time x3 days, then 1 pill daily.  Take w/ food  ICE Call with any questions or concerns Hang in there! HAPPY 4th!!!

## 2022-10-21 NOTE — Progress Notes (Unsigned)
   Rubin Payor, PhD, LAT, ATC acting as a scribe for Clementeen Graham, MD.  Janet Pierce is a 72 y.o. female who presents to [facility] today for L forearm swelling ongoing since the last week of June. She first noticed the swelling after lifting a case of water. 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 Pt locates pain to the anterior aspect of the proximal forearm and radial aspect of the distal forearm. She is RHD.   She does not have much pain.  Her bigger concern is that it swollen.  She denies any numbness or tingling distally.  L forearm swelling: yes Radiates: no Paresthesia: no Grip strength: normal Aggravates: nothing in particular Treatments tried: prednisone, ice  Pertinent review of systems: No fevers or chills.  Relevant historical information: Hypertension   Exam:  BP 118/78   Pulse 86   Ht 5\' 3"  (1.6 m)   Wt 140 lb (63.5 kg)   SpO2 97%   BMI 24.80 kg/m  General: Well Developed, well nourished, and in no acute distress.   MSK: Left forearm: Swollen at proximal volar forearm.  Otherwise normal. Normal elbow and wrist motion. Nontender. Intact strength. No pain with resisted wrist or finger flexion extension pronation supination.    Lab and Radiology Results  Diagnostic Limited MSK Ultrasound of: Left forearm Left forearm mass identified with ultrasound.  Appears to be a enlarged proximal wrist or finger flexor muscle group.  No definitive tear is present. Impression: Left forearm muscle swelling without tear      Assessment and Plan: 72 y.o. female with left forearm swelling after doing a lot of lifting.  I think she has injured or strained a wrist or finger flexor muscle group.  I do not think it is torn significantly.  She does not seem to be bothered by it.  She does not have much pain at all and has great strength.  Plan for some home exercise program stretching and strengthening and  rehabbing the wrist flexors and finger flexors.  If not improving consider occupational therapy.  Recommend using compression sleeve and Voltaren gel.   PDMP not reviewed this encounter. Orders Placed This Encounter  Procedures   [place] LIMITED JOINT SPACE STRUCTURES UP LEFT(NO LINKED CHARGES)    Order Specific Question:   Reason for Exam (SYMPTOM  OR DIAGNOSIS REQUIRED)    Answer:   left forearm pain    Order Specific Question:   Preferred imaging location?    Answer:   [facility]   No orders of the defined types were placed in this encounter.    Discussed warning signs or symptoms. Please see discharge instructions. Patient expresses understanding.   The above documentation has been reviewed and is accurate and complete Clementeen Graham, M.D.

## 2022-10-22 ENCOUNTER — Other Ambulatory Visit: Payer: Self-pay

## 2022-10-22 ENCOUNTER — Ambulatory Visit: Payer: Medicare Other | Admitting: Family Medicine

## 2022-10-22 VITALS — BP 118/78 | HR 86 | Ht 63.0 in | Wt 140.0 lb

## 2022-10-22 DIAGNOSIS — R2232 Localized swelling, mass and lump, left upper limb: Secondary | ICD-10-CM

## 2022-10-22 NOTE — Patient Instructions (Addendum)
Thank you for coming in today.   Please complete the exercises that the athletic trainer went over with you:  View at [email or web address]-exercise-[email or web address] using code: [id number]  Please use Voltaren gel (Generic Diclofenac Gel) up to 4x daily for pain as needed.  This is available over-the-counter as both the name brand Voltaren gel and the generic diclofenac gel.   Compression sleeve  Let me know if not improving

## 2022-11-11 ENCOUNTER — Ambulatory Visit
Admission: RE | Admit: 2022-11-11 | Discharge: 2022-11-11 | Disposition: A | Discharge status: Home / Self Care | Payer: Medicare Other | Source: Ambulatory Visit | Attending: Acute Care | Admitting: Acute Care

## 2022-11-11 DIAGNOSIS — Z87891 Personal history of nicotine dependence: Secondary | ICD-10-CM

## 2022-11-11 DIAGNOSIS — R911 Solitary pulmonary nodule: Secondary | ICD-10-CM

## 2022-11-26 ENCOUNTER — Encounter: Payer: Self-pay | Admitting: Family Medicine

## 2022-11-26 ENCOUNTER — Ambulatory Visit: Payer: Medicare Other | Admitting: Family Medicine

## 2022-11-26 VITALS — BP 104/70 | HR 83 | Temp 98.0°F | Resp 17 | Ht 63.0 in | Wt 140.0 lb

## 2022-11-26 DIAGNOSIS — R300 Dysuria: Secondary | ICD-10-CM | POA: Diagnosis not present

## 2022-11-26 LAB — POCT URINALYSIS DIPSTICK
Bilirubin, UA: POSITIVE
Blood, UA: POSITIVE
Glucose, UA: NEGATIVE
Nitrite, UA: NEGATIVE
Protein, UA: POSITIVE — AB
Spec Grav, UA: 1.025 (ref 1.010–1.025)
Urobilinogen, UA: 0.2 E.U./dL
pH, UA: 6 (ref 5.0–8.0)

## 2022-11-26 MED ORDER — CEPHALEXIN 500 MG PO CAPS: 500.0000 mg | ORAL_CAPSULE | Freq: Two times a day (BID) | ORAL | 0 refills | Status: AC

## 2022-11-26 NOTE — Patient Instructions (Signed)
Follow up as needed or as scheduled START the Cephalexin twice daily Drink LOTS of fluids Call with any questions or concerns Hang In There!!!

## 2022-11-26 NOTE — Progress Notes (Signed)
   Subjective:    Patient ID: Janet Pierce, female    DOB: [birth year], 72 y.o.   MRN: [id number]  HPI UTI- sxs started Sunday w/ 'pinching' when urinating.  Denies frequency.  Denies urgency.  No blood.  No fevers.  Denies N/V.  No suprapubic pain or CVA TTP   Review of Systems For ROS see HPI     Objective:   Physical Exam Vitals reviewed.  Constitutional:      General: She is not in acute distress.    Appearance: Normal appearance. She is well-developed. She is not ill-appearing.  Abdominal:     General: There is no distension.     Palpations: Abdomen is soft.     Tenderness: There is no abdominal tenderness (no suprapubic or CVA tenderness).  Neurological:     General: No focal deficit present.     Mental Status: She is alert and oriented to person, place, and time.  Psychiatric:        Mood and Affect: Mood normal.        Behavior: Behavior normal.           Assessment & Plan:   Dysuria- new.  Pt's 'pinching' pain w/ urination and her UA are consistent w/ infxn.  Start Keflex.  Reviewed supportive care and red flags that should prompt return.  Pt expressed understanding and is in agreement w/ plan.

## 2022-11-27 LAB — URINE CULTURE
MICRO NUMBER:: 15299517
Result:: NO GROWTH
SPECIMEN QUALITY:: ADEQUATE

## 2022-11-28 ENCOUNTER — Telehealth: Payer: Self-pay

## 2022-11-28 NOTE — Telephone Encounter (Signed)
-----   Message from Neena Rhymes sent at 11/28/2022  7:25 AM EDT ----- No evidence of UTI.  Please let me know how you are feeling

## 2022-11-28 NOTE — Telephone Encounter (Signed)
Unable to leave a vm

## 2022-12-01 ENCOUNTER — Other Ambulatory Visit: Payer: Self-pay

## 2022-12-01 ENCOUNTER — Telehealth: Payer: Self-pay | Admitting: Family Medicine

## 2022-12-01 DIAGNOSIS — R7989 Other specified abnormal findings of blood chemistry: Secondary | ICD-10-CM

## 2022-12-01 MED ORDER — VITAMIN D (ERGOCALCIFEROL) 1.25 MG (50000 UNIT) PO CAPS: 50000.0000 [IU] | ORAL_CAPSULE | ORAL | 1 refills | Status: DC

## 2022-12-01 NOTE — Telephone Encounter (Signed)
Mailbox is full.  Letter out

## 2022-12-01 NOTE — Telephone Encounter (Signed)
Left pt a VM to return my call. Vit D has been sent to the pharmacy

## 2022-12-01 NOTE — Telephone Encounter (Signed)
Encourage patient to contact the pharmacy for refills or they can request refills through [facility]   WHAT PHARMACY WOULD THEY LIKE THIS SENT TO:  [id number] - [place], [place] - [address] AT [facility] OF [address]    MEDICATION NAME & DOSE: Vitamin D (Ergocalciferol) Vitamin D, Ergocalciferol, (DRISDOL) 1.25 MG (50000 UNIT) CAPS capsule   NOTES/COMMENTS FROM PATIENT: Pt states she spoke to [facility] or mentioned she was getting tired and wanted Vit D re-prescribed again.  Secondly, Pt feels like UTI is still there.     Front office please notify patient: It takes 48-72 hours to process rx refill requests Ask patient to call pharmacy to ensure rx is ready before heading there.

## 2022-12-01 NOTE — Telephone Encounter (Signed)
Ok to refill her Vit D since she is usually low.  Please ask her about her urinary symptoms- is it burning externally (possibly yeast)?  Or is it something else

## 2022-12-02 ENCOUNTER — Telehealth: Payer: Self-pay | Admitting: Family Medicine

## 2022-12-02 NOTE — Telephone Encounter (Signed)
If she is having a daily upset stomach, she may be having issues w/ excess acid production again.  Please make sure she is taking her Pantoprazole daily and limiting her intake of ibuprofen/aleve/motrin/etc

## 2022-12-02 NOTE — Telephone Encounter (Signed)
Called no answer, VM full unable to leave a message

## 2022-12-02 NOTE — Telephone Encounter (Signed)
Caller name: Azmariah Supinger  On DPR?: Yes  Call back number: [phone] (home)  Provider they see: Sheliah Hatch, MD  Reason for call:   Returned Diamond's call.

## 2022-12-02 NOTE — Telephone Encounter (Signed)
Another encounter has been created and sent to Dr Beverely Low

## 2022-12-02 NOTE — Telephone Encounter (Signed)
Pt states she wakes up every day with an upset stomach . She denies any burning or itching . Denies any burning with urination . States sometime after emptying her bladder she gets the pinching sensation again . She is wanting to know what Dr Beverely Low recommends ?

## 2022-12-03 ENCOUNTER — Telehealth: Payer: Self-pay | Admitting: Family Medicine

## 2022-12-03 NOTE — Telephone Encounter (Signed)
Their is a phone created on 12/02/22 and the conversation has been added to that

## 2022-12-03 NOTE — Telephone Encounter (Signed)
Caller name: Vashtie Forst  On DPR?: Yes  Call back number: [phone] (mobile)  Provider they see: Sheliah Hatch, MD  Reason for call:  Pt called regarding UTI, said she spoke with nurse on 12/02/22 and asked if she could get a return phone call

## 2022-12-03 NOTE — Telephone Encounter (Signed)
I have informed pt of Dr Beverely Low message . She states she is still having the pinching when urination some times . She is wanting to know if we can refer her to Urology ?

## 2022-12-03 NOTE — Telephone Encounter (Signed)
Mailbox is full, unable to leave a message 

## 2022-12-04 ENCOUNTER — Other Ambulatory Visit: Payer: Self-pay

## 2022-12-04 DIAGNOSIS — R300 Dysuria: Secondary | ICD-10-CM

## 2022-12-04 NOTE — Telephone Encounter (Signed)
Ok for urology referral- dx dysuria

## 2022-12-04 NOTE — Telephone Encounter (Signed)
Referral has been placed. 

## 2022-12-11 ENCOUNTER — Telehealth: Payer: Self-pay | Admitting: Acute Care

## 2022-12-11 DIAGNOSIS — Z87891 Personal history of nicotine dependence: Secondary | ICD-10-CM

## 2022-12-11 DIAGNOSIS — R911 Solitary pulmonary nodule: Secondary | ICD-10-CM

## 2022-12-11 NOTE — Telephone Encounter (Signed)
Attempt to reach by phone to review results of LDCT.  No answer. Left VM and call back number.

## 2022-12-11 NOTE — Telephone Encounter (Signed)
Please call patient and let her know we have gone over read on the scan that she had done 11/11/2022.  Mild scan was read as a lung RADS 2, there is a 10.8 mm nodule with a 3.9 mm solid component that has slowly grown since the previous year where it measured 9.3 mm with a 3.6 mm solid component.  I have spoken with Dr. Delton Coombes and he agrees that we should do a 51-month follow-up low-dose screening CT to better evaluate the nodule for interval change.  33-month follow-up will be due at the end of January [id number]

## 2022-12-16 NOTE — Telephone Encounter (Signed)
Spoke with pt regarding lung screening CT results. One small nodule has grown slightly and we will repeat CT in 6 months to f/u. Pt verbalized understanding and is aware we will call her closer to that time to schedule.

## 2022-12-16 NOTE — Addendum Note (Signed)
Addended by: Abigail Miyamoto D on: 12/16/2022 11:45 AM   Modules accepted: Orders

## 2022-12-22 ENCOUNTER — Emergency Department (HOSPITAL_COMMUNITY): Payer: Medicare Other

## 2022-12-22 ENCOUNTER — Other Ambulatory Visit: Payer: Self-pay

## 2022-12-22 ENCOUNTER — Emergency Department (HOSPITAL_COMMUNITY)
Admission: EM | Admit: 2022-12-22 | Discharge: 2022-12-22 | Disposition: A | Discharge status: Home / Self Care | Payer: Medicare Other | Attending: Emergency Medicine | Admitting: Emergency Medicine

## 2022-12-22 ENCOUNTER — Encounter (HOSPITAL_COMMUNITY): Payer: Self-pay

## 2022-12-22 DIAGNOSIS — R002 Palpitations: Secondary | ICD-10-CM | POA: Diagnosis present

## 2022-12-22 DIAGNOSIS — I493 Ventricular premature depolarization: Secondary | ICD-10-CM

## 2022-12-22 DIAGNOSIS — E876 Hypokalemia: Secondary | ICD-10-CM | POA: Diagnosis not present

## 2022-12-22 DIAGNOSIS — Z79899 Other long term (current) drug therapy: Secondary | ICD-10-CM | POA: Diagnosis not present

## 2022-12-22 DIAGNOSIS — R0789 Other chest pain: Secondary | ICD-10-CM | POA: Diagnosis not present

## 2022-12-22 DIAGNOSIS — I10 Essential (primary) hypertension: Secondary | ICD-10-CM | POA: Insufficient documentation

## 2022-12-22 LAB — MAGNESIUM: Magnesium: 2.1 mg/dL (ref 1.7–2.4)

## 2022-12-22 LAB — CBC
HCT: 38.6 % (ref 36.0–46.0)
Hemoglobin: 12.7 g/dL (ref 12.0–15.0)
MCH: 27.6 pg (ref 26.0–34.0)
MCHC: 32.9 g/dL (ref 30.0–36.0)
MCV: 83.9 fL (ref 80.0–100.0)
Platelets: 312 10*3/uL (ref 150–400)
RBC: 4.6 MIL/uL (ref 3.87–5.11)
RDW: 12.6 % (ref 11.5–15.5)
WBC: 3.9 10*3/uL — ABNORMAL LOW (ref 4.0–10.5)
nRBC: 0 % (ref 0.0–0.2)

## 2022-12-22 LAB — BASIC METABOLIC PANEL
Anion gap: 9 (ref 5–15)
BUN: 9 mg/dL (ref 8–23)
CO2: 29 mmol/L (ref 22–32)
Calcium: 9.5 mg/dL (ref 8.9–10.3)
Chloride: 102 mmol/L (ref 98–111)
Creatinine, Ser: 0.75 mg/dL (ref 0.44–1.00)
GFR, Estimated: >60 mL/min (ref >60)
Glucose, Bld: 101 mg/dL — ABNORMAL HIGH (ref 70–99)
Potassium: 3.3 mmol/L — ABNORMAL LOW (ref 3.5–5.1)
Sodium: 140 mmol/L (ref 135–145)

## 2022-12-22 LAB — TROPONIN I (HIGH SENSITIVITY)
Troponin I (High Sensitivity): 3 ng/L (ref <18)
Troponin I (High Sensitivity): 2 ng/L (ref <18)

## 2022-12-22 MED ORDER — SODIUM CHLORIDE 0.9 % IV BOLUS
1000.0000 mL | Freq: Once | INTRAVENOUS | Status: AC
  Administered 2022-12-22: 1000 mL via INTRAVENOUS

## 2022-12-22 MED ORDER — POTASSIUM CHLORIDE 20 MEQ PO PACK
40.0000 meq | PACK | Freq: Once | ORAL | Status: AC
  Administered 2022-12-22: 40 meq via ORAL
  Filled 2022-12-22: qty 2

## 2022-12-22 NOTE — ED Triage Notes (Signed)
Patient stated she has had heart palpitations/flutters since Tuesday. Feeling dehydrated and increased fatigue. Denies chest pain/short of breath.

## 2022-12-22 NOTE — Discharge Instructions (Addendum)
Thank you for allowing [place] to be a part of your care today.  You were evaluated in the ED for palpitations.   Your workup today is reassuring.  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  You do not have evidence of damage to your heart.    Your potassium was slightly low.  I have included a list of foods to incorporate into your diet to help replenish your potassium.    I recommend decreasing your caffeine intake and increasing your water intake throughout the day.   I have placed a referral to cardiology.  They should call you to schedule a follow-up appointment.    Return to the ED if you develop sudden worsening of your symptoms or if you have any new concerns.

## 2022-12-22 NOTE — ED Notes (Signed)
Patient currently not in the room. I will try to place IV and get blood later.

## 2022-12-22 NOTE — ED Provider Notes (Signed)
[place] EMERGENCY DEPARTMENT AT [facility] Provider Note   CSN: [id number] Arrival date & time: 12/22/22  1212     History  Chief Complaint  Patient presents with   Palpitations    Janet Pierce is a 72 y.o. female with past medical history significant for scoliosis, hypertension, hyperlipidemia, anxiety, GERD, anemia, osteoporosis presents to the ED complaining of heart palpitations and flutters since Tuesday.  She states she is feeling dehydrated with increased fatigue.  Patient states she has had an increase in stress in her life and feels a "bit of chest pressure".  She also admits to drinking caffeine throughout the day and has not been drinking as much water.  Denies sharp chest pain, shortness of breath, syncope, light-headedness, dizziness.        Home Medications Prior to Admission medications   Medication Sig Start Date End Date Taking? Authorizing Provider  albuterol (VENTOLIN HFA) 108 (90 Base) MCG/ACT inhaler Inhale 2 puffs into the lungs every 6 (six) hours as needed for wheezing or shortness of breath. 07/02/22   Sheliah Hatch, MD  amLODipine (NORVASC) 10 MG tablet Take 1 tablet (10 mg total) by mouth daily before breakfast. 12/12/21   Sheliah Hatch, MD  atorvastatin (LIPITOR) 20 MG tablet Take 1 tablet (20 mg total) by mouth daily. 06/04/22   Sheliah Hatch, MD  brimonidine (ALPHAGAN P) 0.1 % SOLN Place 1 drop into the right eye in the morning and at bedtime.    [provider]  cetirizine (ZYRTEC) 10 MG tablet Take 1 tablet (10 mg total) by mouth daily. 07/02/22   Sheliah Hatch, MD  dorzolamide (TRUSOPT) 2 % ophthalmic solution INSTILL 1 DROP INTO BOTH EYES BID 05/08/17   [provider]  LUMIGAN 0.01 % SOLN 1 drop at bedtime. 03/13/20   [provider]  Multiple Vitamins-Minerals (MULTIVITAMIN GUMMIES WOMENS) CHEW Chew 2 Doses by mouth every morning.    [provider]  pantoprazole (PROTONIX) 40 MG  tablet Take 1 tablet (40 mg total) by mouth daily. 06/16/22   Sheliah Hatch, MD  predniSONE (DELTASONE) 10 MG tablet 3 tabs x3 days and then 2 tabs x3 days and then 1 tab x3 days.  Take w/ food. 10/17/22   Sheliah Hatch, MD  timolol (TIMOPTIC) 0.5 % ophthalmic solution Place 1 drop into the right eye 2 (two) times daily.  09/13/13   [provider]  Vitamin D, Ergocalciferol, (DRISDOL) 1.25 MG (50000 UNIT) CAPS capsule Take 1 capsule (50,000 Units total) by mouth every 7 (seven) days. 12/01/22   Sheliah Hatch, MD  XDEMVY 0.25 % SOLN Apply 1 drop to eye 2 (two) times daily. 06/04/22   [provider]      Allergies    Patient has no known allergies.    Review of Systems   Review of Systems  Respiratory:  Negative for shortness of breath.   Cardiovascular:  Positive for palpitations. Negative for chest pain.  Neurological:  Negative for dizziness, syncope and light-headedness.    Physical Exam Updated Vital Signs BP (!) 145/91   Pulse 83   Temp 97.8 F (36.6 C) (Oral)   Resp 12   Ht 5\' 3"  (1.6 m)   Wt 62.6 kg   SpO2 100%   BMI 24.45 kg/m  Physical Exam Vitals and nursing note reviewed.  Constitutional:      General: She is not in acute distress.    Appearance: Normal appearance. She is not  ill-appearing or diaphoretic.  Cardiovascular:     Rate and Rhythm: Normal rate and regular rhythm. Occasional Extrasystoles are present.    Pulses: Normal pulses.     Heart sounds: Normal heart sounds.  Pulmonary:     Effort: Pulmonary effort is normal. No tachypnea or respiratory distress.     Breath sounds: Normal breath sounds and air entry.  Musculoskeletal:     Right lower leg: No edema.     Left lower leg: No edema.  Skin:    General: Skin is warm and dry.     Capillary Refill: Capillary refill takes less than 2 seconds.  Neurological:     Mental Status: She is alert. Mental status is at baseline.  Psychiatric:        Mood and Affect: Mood  normal.        Behavior: Behavior normal.     ED Results / Procedures / Treatments   Labs (all labs ordered are listed, but only abnormal results are displayed) Labs Reviewed  BASIC METABOLIC PANEL - Abnormal; Notable for the following components:      Result Value   Potassium 3.3 (*)    Glucose, Bld 101 (*)    All other components within normal limits  CBC - Abnormal; Notable for the following components:   WBC 3.9 (*)    All other components within normal limits  MAGNESIUM  TROPONIN I (HIGH SENSITIVITY)  TROPONIN I (HIGH SENSITIVITY)    EKG EKG Interpretation Date/Time:  Monday December 22 2022 12:22:17 EDT Ventricular Rate:  93 PR Interval:  197 QRS Duration:  89 QT Interval:  377 QTC Calculation: 469 R Axis:   6  Text Interpretation: Sinus rhythm Ventricular premature complex Borderline T wave abnormalities No significant change since last tracing Confirmed by Lorre Nick ([id number]) on 12/22/2022 4:16:44 PM  Radiology DG Chest 2 View  Result Date: 12/22/2022 CLINICAL DATA:  Heart palpitations.  Fatigue EXAM: CHEST - 2 VIEW COMPARISON:  07/15/2014 x-ray, 11/11/2022 CT FINDINGS: The heart size and mediastinal contours are within normal limits. Aortic atherosclerosis. Both lungs are clear. Severe S-shaped thoracolumbar spinal curvature. IMPRESSION: No active cardiopulmonary disease. Electronically Signed   By: Duanne Guess D.O.   On: 12/22/2022 13:22    Procedures Procedures    Medications Ordered in ED Medications  sodium chloride 0.9 % bolus 1,000 mL (0 mLs Intravenous Stopped 12/22/22 1927)  potassium chloride (KLOR-CON) packet 40 mEq (40 mEq Oral Given 12/22/22 1711)    ED Course/ Medical Decision Making/ A&P                                 Medical Decision Making Amount and/or Complexity of Data Reviewed Labs: ordered. Radiology: ordered.  Risk Prescription drug management.   This patient presents to the ED with chief complaint(s) of palpitations,  chest pressure with pertinent past medical history of PVCs, hypertension, hyperlipidemia.  The complaint involves an extensive differential diagnosis and also carries with it a high risk of complications and morbidity.    The differential diagnosis includes ACS, cardiac dysrhythmia, electrolyte disturbance, metabolic derangement   The initial plan is to obtain labs, give IV fluids  Initial Assessment:   Exam significant for overall well-appearing patient who is not in acute distress.  Heart rate normal in the 80s with occasional extrasystoles.  Sinus rhythm on the monitor with occasional multiform PVCs.  Skin is warm and dry.  No lower extremity  edema.    Independent ECG/labs interpretation:  The following labs were independently interpreted:  CBC with mild leukopenia, no anemia.  Metabolic panel with mild hypokalemia, no other electrolyte disturbance.  Renal function normal.  Troponin negative.  Magnesium within normal range.    Independent visualization and interpretation of imaging: I independently visualized the following imaging with scope of interpretation limited to determining acute life threatening conditions related to emergency care: chest x-ray, which revealed no evidence of acute cardiopulmonary disease.  Cardiac silhouette is normal.   Treatment and Reassessment: Patient given IV fluids and potassium for mild hypokalemia.  Patient reports feeling improved on reassessment.   Disposition:   Will refer patient to cardiology for follow-up on palpitations.  Workup today is overall reassuring.  Suspect patient may be feeling PVCs which were on ECG and identified on cardiac monitor.  Patient provided with information about potassium content in foods, encouraged her to increase these foods in her diet.  Recommended patient decrease caffeine intake and increase water intake.   The patient has been appropriately medically screened and/or stabilized in the ED. I have low suspicion for any  other emergent medical condition which would require further screening, evaluation or treatment in the ED or require inpatient management. At time of discharge the patient is hemodynamically stable and in no acute distress. I have discussed work-up results and diagnosis with patient and answered all questions. Patient is agreeable with discharge plan. We discussed strict return precautions for returning to the emergency department and they verbalized understanding.              Final Clinical Impression(s) / ED Diagnoses Final diagnoses:  Palpitations  Chest pressure  Hypokalemia  Premature ventricular contractions    Rx / DC Orders ED Discharge Orders          Ordered    Ambulatory referral to Cardiology       Comments: If you have not heard from the Cardiology office within the next 72 hours please call [phone].   12/22/22 1916              Lenard Simmer, PA-C 12/22/22 2338    Lorre Nick, MD 12/23/22 1627

## 2022-12-24 ENCOUNTER — Ambulatory Visit (INDEPENDENT_AMBULATORY_CARE_PROVIDER_SITE_OTHER): Payer: Medicare Other | Admitting: *Deleted

## 2022-12-24 DIAGNOSIS — Z0000 Encounter for general adult medical examination without abnormal findings: Secondary | ICD-10-CM | POA: Diagnosis not present

## 2022-12-24 NOTE — Progress Notes (Signed)
Subjective:   Janet Pierce is a 72 y.o. female who presents for Medicare Annual (Subsequent) preventive examination.  Visit Complete: Virtual  I connected with  [name] on 12/24/22 by a audio enabled telemedicine application and verified that I am speaking with the correct person using two identifiers.  Patient Location: Home  Provider Location: Home Office  I discussed the limitations of evaluation and management by telemedicine. The patient expressed understanding and agreed to proceed.  Patient Medicare AWV questionnaire was completed by the patient on ; I have confirmed that all information answered by patient is correct and no changes since this date.  Vital Signs: Unable to obtain new vitals due to this being a telehealth visit.   Review of Systems     Cardiac Risk Factors include: advanced age (>60men, >61 women);hypertension;smoking/ tobacco exposure     Objective:    There were no vitals filed for this visit. There is no height or weight on file to calculate BMI.     12/24/2022   11:59 AM 12/22/2022   12:41 PM 02/20/2022   11:08 AM 08/11/2021    7:47 PM 04/10/2021   10:28 AM 08/06/2020   12:52 PM 04/27/2019   11:20 AM  Advanced Directives  Does Patient Have a Medical Advance Directive? No No Yes No Yes No No  Type of [place] [employer] [place];Living will      Does patient want to make changes to medical advance directive?     No - Patient declined    Copy of Healthcare Power of Attorney in Chart?   No - copy requested      Would patient like information on creating a medical advance directive? No - Patient declined No - Patient declined    No - Patient declined Yes (MAU/Ambulatory/Procedural Areas - Information given)    Current Medications (verified) Outpatient Encounter Medications as of 12/24/2022  Medication Sig   albuterol (VENTOLIN HFA) 108 (90 Base) MCG/ACT inhaler Inhale 2 puffs into the lungs every 6 (six) hours as needed for  wheezing or shortness of breath.   amLODipine (NORVASC) 10 MG tablet Take 1 tablet (10 mg total) by mouth daily before breakfast.   atorvastatin (LIPITOR) 20 MG tablet Take 1 tablet (20 mg total) by mouth daily.   brimonidine (ALPHAGAN P) 0.1 % SOLN Place 1 drop into the right eye in the morning and at bedtime.   cetirizine (ZYRTEC) 10 MG tablet Take 1 tablet (10 mg total) by mouth daily.   dorzolamide (TRUSOPT) 2 % ophthalmic solution INSTILL 1 DROP INTO BOTH EYES BID   LUMIGAN 0.01 % SOLN 1 drop at bedtime.   Multiple Vitamins-Minerals (MULTIVITAMIN GUMMIES WOMENS) CHEW Chew 2 Doses by mouth every morning.   pantoprazole (PROTONIX) 40 MG tablet Take 1 tablet (40 mg total) by mouth daily.   timolol (TIMOPTIC) 0.5 % ophthalmic solution Place 1 drop into the right eye 2 (two) times daily.    Vitamin D, Ergocalciferol, (DRISDOL) 1.25 MG (50000 UNIT) CAPS capsule Take 1 capsule (50,000 Units total) by mouth every 7 (seven) days.   XDEMVY 0.25 % SOLN Apply 1 drop to eye 2 (two) times daily.   predniSONE (DELTASONE) 10 MG tablet 3 tabs x3 days and then 2 tabs x3 days and then 1 tab x3 days.  Take w/ food. (Patient not taking: Reported on 12/24/2022)   No facility-administered encounter medications on file as of 12/24/2022.    Allergies (verified) Patient has no known allergies.  History: Past Medical History:  Diagnosis Date   Allergy    Anemia    Anxiety    Arthritis    Cataract    GERD (gastroesophageal reflux disease)    Glaucoma    Hyperlipidemia    Hypertension    Osteoporosis    Retinal detachment    left eye prosthesis   Scoliosis    Past Surgical History:  Procedure Laterality Date   BUNIONECTOMY Left    CATARACT EXTRACTION Right    EYE SURGERY Right    glucoma   FINGER SURGERY Right    ring finger   LIPOMA EXCISION     hip   RETINAL DETACHMENT SURGERY Left    TONSILLECTOMY     Family History  Problem Relation Age of Onset   Lung cancer Mother    Hypertension  Mother    Bone cancer Mother    Liver cancer Mother    Other Sister        brain tumor   Hypertension Brother    Diabetes Maternal Grandmother    Fibroids Daughter    Colon cancer Neg Hx    Colon polyps Neg Hx    Esophageal cancer Neg Hx    Rectal cancer Neg Hx    Stomach cancer Neg Hx    Social History   Socioeconomic History   Marital status: Legally Separated    Spouse name: Not on file   Number of children: 2   Years of education: Not on file   Highest education level: Not on file  Occupational History   Occupation: Airline pilot associate/retired    Comment: [employer]  Tobacco Use   Smoking status: Every Day    Current packs/day: 0.50    Average packs/day: 0.5 packs/day for 40.0 years (20.0 ttl pk-yrs)    Types: Cigarettes   Smokeless tobacco: Never   Tobacco comments:    Resumed smoking in Sept 2023  Vaping Use   Vaping status: Never Used  Substance and Sexual Activity   Alcohol use: Yes    Alcohol/week: 0.0 standard drinks of alcohol    Comment: occasionally   Drug use: No   Sexual activity: Not Currently  Other Topics Concern   Not on file  Social History Narrative   Not on file   Social Determinants of Health   Financial Resource Strain: Low Risk  (12/24/2022)   Overall Financial Resource Strain (CARDIA)    Difficulty of Paying Living Expenses: Not very hard  Food Insecurity: No Food Insecurity (12/24/2022)   Hunger Vital Sign    Worried About Running Out of Food in the Last Year: Never true    Ran Out of Food in the Last Year: Never true  Transportation Needs: No Transportation Needs (12/24/2022)   PRAPARE - Administrator, Civil Service (Medical): No    Lack of Transportation (Non-Medical): No  Physical Activity: Insufficiently Active (12/24/2022)   Exercise Vital Sign    Days of Exercise per Week: 1 day    Minutes of Exercise per Session: 60 min  Stress: Stress Concern Present (12/24/2022)   [employer] of Occupational Health - Occupational  Stress Questionnaire    Feeling of Stress : To some extent  Social Connections: Moderately Isolated (12/24/2022)   Social Connection and Isolation Panel [NHANES]    Frequency of Communication with Friends and Family: More than three times a week    Frequency of Social Gatherings with Friends and Family: Three times a week  Attends Religious Services: Never    Active Member of Clubs or Organizations: Yes    Attends Banker Meetings: More than 4 times per year    Marital Status: Separated    Tobacco Counseling Ready to quit: Not Answered Counseling given: Not Answered Tobacco comments: Resumed smoking in Sept 2023   Clinical Intake:  Pre-visit preparation completed: Yes  Pain : 0-10     Diabetes: No  How often do you need to have someone help you when you read instructions, pamphlets, or other written materials from your doctor or pharmacy?: 1 - Never  Interpreter Needed?: No  Information entered by :: Remi Haggard LPN   Activities of Daily Living    12/24/2022   12:00 PM 02/20/2022   11:08 AM  In your present state of health, do you have any difficulty performing the following activities:  Hearing? 0 0  Vision? 0 0  Difficulty concentrating or making decisions? 0 0  Walking or climbing stairs?  0  Dressing or bathing? 0 0  Doing errands, shopping? 0 0  Preparing Food and eating ? N N  Using the Toilet? N N  In the past six months, have you accidently leaked urine? N N  Do you have problems with loss of bowel control? N N  Managing your Medications? N N  Managing your Finances? N N  Housekeeping or managing your Housekeeping? N N    Patient Care Team: Sheliah Hatch, MD as PCP - [facility]) Tyrell Antonio, MD as Consulting Physician (Physical Medicine and Rehabilitation) Tarry Kos, MD as Consulting Physician (Orthopedic Surgery) Augustin Schooling, MD as Consulting Physician (Ophthalmology)  Indicate any  recent Medical Services you may have received from other than [facility] providers in the past year (date may be approximate).     Assessment:   This is a routine wellness examination for Janet Pierce.  Hearing/Vision screen Hearing Screening - Comments:: No trouble hearing Vision Screening - Comments:: Dr. Valarie Merino Up to date  Dietary issues and exercise activities discussed:     Goals Addressed             This Visit's Progress    Patient Stated       Eat better foods rich potasium        Depression Screen    12/24/2022   12:04 PM 11/26/2022    2:31 PM 10/17/2022    2:39 PM 08/21/2022   10:33 AM 07/02/2022    9:38 AM 05/23/2022    1:29 PM 03/31/2022   10:11 AM  PHQ 2/9 Scores  PHQ - 2 Score 1 0 0 1 1 0 1  PHQ- 9 Score 1 1 2 2 2  0 2    Fall Risk    12/24/2022   11:57 AM 11/26/2022    2:31 PM 10/17/2022    2:39 PM 07/02/2022    9:39 AM 05/23/2022    1:29 PM  Fall Risk   Falls in the past year? 0 0 0 0 0  Number falls in past yr: 0 0 0 0 0  Injury with Fall? 0 0 0 0 0  Risk for fall due to :  No Fall Risks No Fall Risks No Fall Risks No Fall Risks  Follow up Falls evaluation completed;Education provided;Falls prevention discussed Falls evaluation completed Falls evaluation completed Falls evaluation completed Falls evaluation completed    MEDICARE RISK AT HOME: Medicare Risk at Home Any stairs in or around the  home?: Yes If so, are there any without handrails?: Yes Home free of loose throw rugs in walkways, pet beds, electrical cords, etc?: Yes Adequate lighting in your home to reduce risk of falls?: Yes Life alert?: No Use of a cane, walker or w/c?: No Grab bars in the bathroom?: No Shower chair or bench in shower?: No Elevated toilet seat or a handicapped toilet?: No  TIMED UP AND GO:  Was the test performed?  No    Cognitive Function:    04/20/2018   11:36 AM  MMSE - Mini Mental State Exam  Orientation to time 5  Orientation to Place 5  Registration 3  Attention/  Calculation 5  Recall 2  Language- name 2 objects 2  Language- repeat 1  Language- follow 3 step command 3  Language- read & follow direction 1  Write a sentence 1  Copy design 1  Total score 29        12/24/2022   12:05 PM 02/20/2022   11:09 AM  6CIT Screen  What Year? 0 points 0 points  What month? 0 points 0 points  What time? 0 points 0 points  Count back from 20 0 points 0 points  Months in reverse 0 points 0 points  Repeat phrase  0 points  Total Score  0 points    Immunizations Immunization History  Administered Date(s) Administered   Moderna SARS-COV2 Booster Vaccination 03/12/2020   Moderna Sars-Covid-2 Vaccination 05/22/2019, 06/19/2019    TDAP status: Due, Education has been provided regarding the importance of this vaccine. Advised may receive this vaccine at local pharmacy or Health Dept. Aware to provide a copy of the vaccination record if obtained from local pharmacy or Health Dept. Verbalized acceptance and understanding.  Flu Vaccine status: Due, Education has been provided regarding the importance of this vaccine. Advised may receive this vaccine at local pharmacy or Health Dept. Aware to provide a copy of the vaccination record if obtained from local pharmacy or Health Dept. Verbalized acceptance and understanding.  Pneumococcal vaccine status: Due, Education has been provided regarding the importance of this vaccine. Advised may receive this vaccine at local pharmacy or Health Dept. Aware to provide a copy of the vaccination record if obtained from local pharmacy or Health Dept. Verbalized acceptance and understanding.  Covid-19 vaccine status: Information provided on how to obtain vaccines.   Qualifies for Shingles Vaccine? Yes   Zostavax completed No   Shingrix Completed?: No.    Education has been provided regarding the importance of this vaccine. Patient has been advised to call insurance company to determine out of pocket expense if they have not yet  received this vaccine. Advised may also receive vaccine at local pharmacy or Health Dept. Verbalized acceptance and understanding.  Screening Tests Health Maintenance  Topic Date Due   INFLUENZA VACCINE  Never done   Pneumonia Vaccine 9+ Years old (1 of 2 - PCV) 01/16/2023 (Originally 03/23/1957)   MAMMOGRAM  06/24/2023   Lung Cancer Screening  11/11/2023   Colonoscopy  12/11/2023   Medicare Annual Wellness (AWV)  12/24/2023   DEXA SCAN  Completed   HPV VACCINES  Aged Out   DTaP/Tdap/Td  Discontinued   COVID-19 Vaccine  Discontinued   Hepatitis C Screening  Discontinued   Zoster Vaccines- Shingrix  Discontinued    Health Maintenance  Health Maintenance Due  Topic Date Due   INFLUENZA VACCINE  Never done    Colorectal cancer screening: Type of screening: Colonoscopy. Completed 2022. Repeat every  3 years  Mammogram status: Completed  . Repeat every year  Bone Density status: Completed 2023. Results reflect: Bone density results: OSTEOPENIA. Repeat every 5 years.i  Lung Cancer Screening: (Low Dose CT Chest recommended if Age 95-80 years, 20 pack-year currently smoking OR have quit w/in 15years.) does qualify.   Lung Cancer Screening Referral: is due in 6 month not a year 05-2023  Additional Screening:  Hepatitis C Screening   never done  Vision Screening: Recommended annual ophthalmology exams for early detection of glaucoma and other disorders of the eye. Is the patient up to date with their annual eye exam?  Yes  Who is the provider or what is the name of the office in which the patient attends annual eye exams? [facility] If pt is not established with a provider, would they like to be referred to a provider to establish care? No .   Dental Screening: Recommended annual dental exams for proper oral hygiene    Community Resource Referral / Chronic Care Management: CRR required this visit?  No   CCM required this visit?  No     Plan:     I have personally reviewed and  noted the following in the patient's chart:   Medical and social history Use of alcohol, tobacco or illicit drugs  Current medications and supplements including opioid prescriptions. Patient is not currently taking opioid prescriptions. Functional ability and status Nutritional status Physical activity Advanced directives List of other physicians Hospitalizations, surgeries, and ER visits in previous 12 months Vitals Screenings to include cognitive, depression, and falls Referrals and appointments  In addition, I have reviewed and discussed with patient certain preventive protocols, quality metrics, and best practice recommendations. A written personalized care plan for preventive services as well as general preventive health recommendations were provided to patient.     Remi Haggard, LPN   04/21/9145   After Visit Summary: (MyChart) Due to this being a telephonic visit, the after visit summary with patients personalized plan was offered to patient via MyChart   Nurse Notes:  Patient was seen in  ER for palpitations, she is scheduled to see Cardiology 01-01-2023. Scheuled appointment for patient to discuss a referral for back and shoulder pain.  She had lung cancer screening 11-2022 did want her to return in 6 months she will be due in 05-2023  Patient did not schedule a hospital follow up wanted to wait until she saw Cardiology did explain the appointment for the referral is not a  hospital follow up visit .

## 2022-12-24 NOTE — Patient Instructions (Signed)
Janet Pierce , Thank you for taking time to come for your Medicare Wellness Visit. I appreciate your ongoing commitment to your health goals. Please review the following plan we discussed and let me know if I can assist you in the future.   Screening recommendations/referrals: Colonoscopy: Education provided Mammogram: up to date Bone Density: up to date Recommended yearly ophthalmology/optometry visit for glaucoma screening and checkup Recommended yearly dental visit for hygiene and checkup  Vaccinations: Influenza vaccine: Education provided Pneumococcal vaccine: Education provided     Advanced directives: Education provided     Preventive Care 65 Years and Older, Female Preventive care refers to lifestyle choices and visits with your health care provider that can promote health and wellness. What does preventive care include? A yearly physical exam. This is also called an annual well check. Dental exams once or twice a year. Routine eye exams. Ask your health care provider how often you should have your eyes checked. Personal lifestyle choices, including: Daily care of your teeth and gums. Regular physical activity. Eating a healthy diet. Avoiding tobacco and drug use. Limiting alcohol use. Practicing safe sex. Taking low-dose aspirin every day. Taking vitamin and mineral supplements as recommended by your health care provider. What happens during an annual well check? The services and screenings done by your health care provider during your annual well check will depend on your age, overall health, lifestyle risk factors, and family history of disease. Counseling  Your health care provider may ask you questions about your: Alcohol use. Tobacco use. Drug use. Emotional well-being. Home and relationship well-being. Sexual activity. Eating habits. History of falls. Memory and ability to understand (cognition). Work and work Astronomer. Reproductive health. Screening   You may have the following tests or measurements: Height, weight, and BMI. Blood pressure. Lipid and cholesterol levels. These may be checked every 5 years, or more frequently if you are over 62 years old. Skin check. Lung cancer screening. You may have this screening every year starting at age 69 if you have a 30-pack-year history of smoking and currently smoke or have quit within the past 15 years. Fecal occult blood test (FOBT) of the stool. You may have this test every year starting at age 44. Flexible sigmoidoscopy or colonoscopy. You may have a sigmoidoscopy every 5 years or a colonoscopy every 10 years starting at age 32. Hepatitis C blood test. Hepatitis B blood test. Sexually transmitted disease (STD) testing. Diabetes screening. This is done by checking your blood sugar (glucose) after you have not eaten for a while (fasting). You may have this done every 1-3 years. Bone density scan. This is done to screen for osteoporosis. You may have this done starting at age 62. Mammogram. This may be done every 1-2 years. Talk to your health care provider about how often you should have regular mammograms. Talk with your health care provider about your test results, treatment options, and if necessary, the need for more tests. Vaccines  Your health care provider may recommend certain vaccines, such as: Influenza vaccine. This is recommended every year. Tetanus, diphtheria, and acellular pertussis (Tdap, Td) vaccine. You may need a Td booster every 10 years. Zoster vaccine. You may need this after age 34. Pneumococcal 13-valent conjugate (PCV13) vaccine. One dose is recommended after age 10. Pneumococcal polysaccharide (PPSV23) vaccine. One dose is recommended after age 75. Talk to your health care provider about which screenings and vaccines you need and how often you need them. This information is not intended to replace  advice given to you by your health care provider. Make sure you discuss  any questions you have with your health care provider. Document Released: 05/04/2015 Document Revised: 12/26/2015 Document Reviewed: 02/06/2015 Elsevier Interactive Patient Education  2017 [employer].  Fall Prevention in the Home Falls can cause injuries. They can happen to people of all ages. There are many things you can do to make your home safe and to help prevent falls. What can I do on the outside of my home? Regularly fix the edges of walkways and driveways and fix any cracks. Remove anything that might make you trip as you walk through a door, such as a raised step or threshold. Trim any bushes or trees on the path to your home. Use bright outdoor lighting. Clear any walking paths of anything that might make someone trip, such as rocks or tools. Regularly check to see if handrails are loose or broken. Make sure that both sides of any steps have handrails. Any raised decks and porches should have guardrails on the edges. Have any leaves, snow, or ice cleared regularly. Use sand or salt on walking paths during winter. Clean up any spills in your garage right away. This includes oil or grease spills. What can I do in the bathroom? Use night lights. Install grab bars by the toilet and in the tub and shower. Do not use towel bars as grab bars. Use non-skid mats or decals in the tub or shower. If you need to sit down in the shower, use a plastic, non-slip stool. Keep the floor dry. Clean up any water that spills on the floor as soon as it happens. Remove soap buildup in the tub or shower regularly. Attach bath mats securely with double-sided non-slip rug tape. Do not have throw rugs and other things on the floor that can make you trip. What can I do in the bedroom? Use night lights. Make sure that you have a light by your bed that is easy to reach. Do not use any sheets or blankets that are too big for your bed. They should not hang down onto the floor. Have a firm chair that has  side arms. You can use this for support while you get dressed. Do not have throw rugs and other things on the floor that can make you trip. What can I do in the kitchen? Clean up any spills right away. Avoid walking on wet floors. Keep items that you use a lot in easy-to-reach places. If you need to reach something above you, use a strong step stool that has a grab bar. Keep electrical cords out of the way. Do not use floor polish or wax that makes floors slippery. If you must use wax, use non-skid floor wax. Do not have throw rugs and other things on the floor that can make you trip. What can I do with my stairs? Do not leave any items on the stairs. Make sure that there are handrails on both sides of the stairs and use them. Fix handrails that are broken or loose. Make sure that handrails are as long as the stairways. Check any carpeting to make sure that it is firmly attached to the stairs. Fix any carpet that is loose or worn. Avoid having throw rugs at the top or bottom of the stairs. If you do have throw rugs, attach them to the floor with carpet tape. Make sure that you have a light switch at the top of the stairs and the bottom  of the stairs. If you do not have them, ask someone to add them for you. What else can I do to help prevent falls? Wear shoes that: Do not have high heels. Have rubber bottoms. Are comfortable and fit you well. Are closed at the toe. Do not wear sandals. If you use a stepladder: Make sure that it is fully opened. Do not climb a closed stepladder. Make sure that both sides of the stepladder are locked into place. Ask someone to hold it for you, if possible. Clearly mark and make sure that you can see: Any grab bars or handrails. First and last steps. Where the edge of each step is. Use tools that help you move around (mobility aids) if they are needed. These include: Canes. Walkers. Scooters. Crutches. Turn on the lights when you go into a dark area.  Replace any light bulbs as soon as they burn out. Set up your furniture so you have a clear path. Avoid moving your furniture around. If any of your floors are uneven, fix them. If there are any pets around you, be aware of where they are. Review your medicines with your doctor. Some medicines can make you feel dizzy. This can increase your chance of falling. Ask your doctor what other things that you can do to help prevent falls. This information is not intended to replace advice given to you by your health care provider. Make sure you discuss any questions you have with your health care provider. Document Released: 02/01/2009 Document Revised: 09/13/2015 Document Reviewed: 05/12/2014 Elsevier Interactive Patient Education  2017 [employer].

## 2022-12-31 NOTE — Progress Notes (Unsigned)
Cardiology Office Note   Date:  01/01/2023   ID:  Kimira Colonna, DOB 08/29/1950, MRN [id number]  PCP:  Sheliah Hatch, MD  Cardiologist:   Rollene Rotunda, MD Referring:  ED  No chief complaint on file.     History of Present Illness: Janet Pierce is a 72 y.o. female who presents for evaluation of palpitations.  She was in the emergency room for this recently.  I reviewed these records.  She had a lot of stress going on.  However, her palpitations went on for about 3 days after the stressful event.  In the emergency room she was noted to have hypokalemia.  She was thought to be dehydrated.  There was a PVC noted on her EKG.  Since then she has stayed hydrated and her stress is a little bit better. The patient denies any new symptoms such as chest discomfort, neck or arm discomfort. There has been no new shortness of breath, PND or orthopnea. There have been no reported palpitations, presyncope or syncope.   She does not exercise routinely but she goes up and down stairs routinely and she has not had any symptoms related to this.  She was seen by Dr. Tenny Craw for a murmur and chest pain in 2017.    She had a stress echo negative for ischemia.  There was some mild aortic insufficiency.   Past Medical History:  Diagnosis Date   Allergy    Anemia    Anxiety    Arthritis    Cataract    GERD (gastroesophageal reflux disease)    Glaucoma    Hyperlipidemia    Hypertension    Osteoporosis    Retinal detachment    left eye prosthesis   Scoliosis     Past Surgical History:  Procedure Laterality Date   BUNIONECTOMY Left    CATARACT EXTRACTION Right    EYE SURGERY Right    glucoma   FINGER SURGERY Right    ring finger   LIPOMA EXCISION     hip   RETINAL DETACHMENT SURGERY Left    TONSILLECTOMY       Current Outpatient Medications  Medication Sig Dispense Refill   amLODipine (NORVASC) 10 MG tablet Take 1 tablet (10 mg total) by mouth daily before breakfast. 90 tablet 3    atorvastatin (LIPITOR) 40 MG tablet Take 1 tablet (40 mg total) by mouth daily. 90 tablet 3   brimonidine (ALPHAGAN P) 0.1 % SOLN Place 1 drop into the right eye in the morning and at bedtime.     dorzolamide (TRUSOPT) 2 % ophthalmic solution INSTILL 1 DROP INTO BOTH EYES BID  1   LUMIGAN 0.01 % SOLN 1 drop at bedtime.     Multiple Vitamins-Minerals (MULTIVITAMIN GUMMIES WOMENS) CHEW Chew 2 Doses by mouth every morning.     pantoprazole (PROTONIX) 40 MG tablet Take 1 tablet (40 mg total) by mouth daily. 90 tablet 1   timolol (TIMOPTIC) 0.5 % ophthalmic solution Place 1 drop into the right eye 2 (two) times daily.      Vitamin D, Ergocalciferol, (DRISDOL) 1.25 MG (50000 UNIT) CAPS capsule Take 1 capsule (50,000 Units total) by mouth every 7 (seven) days. 12 capsule 1   albuterol (VENTOLIN HFA) 108 (90 Base) MCG/ACT inhaler Inhale 2 puffs into the lungs every 6 (six) hours as needed for wheezing or shortness of breath. (Patient not taking: Reported on 01/01/2023) 8 g 0   cetirizine (ZYRTEC) 10 MG tablet Take 1 tablet (10  mg total) by mouth daily. (Patient not taking: Reported on 01/01/2023) 30 tablet 6   predniSONE (DELTASONE) 10 MG tablet 3 tabs x3 days and then 2 tabs x3 days and then 1 tab x3 days.  Take w/ food. (Patient not taking: Reported on 01/01/2023) 18 tablet 0   XDEMVY 0.25 % SOLN Apply 1 drop to eye 2 (two) times daily. (Patient not taking: Reported on 01/01/2023)     No current facility-administered medications for this visit.    Allergies:   Patient has no known allergies.    Social History:  The patient  reports that she has been smoking cigarettes. She has a 20 pack-year smoking history. She has never used smokeless tobacco. She reports current alcohol use. She reports that she does not use drugs.   Family History:  The patient's family history includes Bone cancer in her mother; Cancer in her father; Diabetes in her maternal grandmother; Fibroids in her daughter; Hypertension in her  brother and mother; Liver cancer in her mother; Lung cancer in her mother; Other in her sister.    ROS:  Please see the history of present illness.   Otherwise, review of systems are positive for none.   All other systems are reviewed and negative.    PHYSICAL EXAM: VS:  BP 122/66 (BP Location: Left Arm, Patient Position: Sitting, Cuff Size: Normal)   Pulse 94   Ht 5\' 3"  (1.6 m)   SpO2 99%   BMI 24.45 kg/m  , BMI Body mass index is 24.45 kg/m. GENERAL:  Well appearing HEENT:  Pupils equal round and reactive, fundi not visualized, oral mucosa unremarkable NECK:  No jugular venous distention, waveform within normal limits, carotid upstroke brisk and symmetric, no bruits, no thyromegaly LYMPHATICS:  No cervical, inguinal adenopathy LUNGS:  Clear to auscultation bilaterally BACK:  No CVA tenderness CHEST:  Unremarkable HEART:  PMI not displaced or sustained,S1 and S2 within normal limits, no S3, no S4, no clicks, no rubs, very brief apical systolic murmur nonradiating, no diastolic murmurs ABD:  Flat, positive bowel sounds normal in frequency in pitch, no bruits, no rebound, no guarding, no midline pulsatile mass, no hepatomegaly, no splenomegaly EXT:  2 plus pulses throughout, no edema, no cyanosis no clubbing SKIN:  No rashes no nodules NEURO:  Cranial nerves II through XII grossly intact, motor grossly intact throughout PSYCH:  Cognitively intact, oriented to person place and time    EKG:        Recent Labs: 08/21/2022: ALT 26; TSH 2.50 12/22/2022: BUN 9; Creatinine, Ser 0.75; Hemoglobin 12.7; Magnesium 2.1; Platelets 312; Potassium 3.3; Sodium 140    Lipid Panel    Component Value Date/Time   CHOL 162 08/21/2022 1050   TRIG 104.0 08/21/2022 1050   HDL 48.30 08/21/2022 1050   CHOLHDL 3 08/21/2022 1050   VLDL 20.8 08/21/2022 1050   LDLCALC 93 08/21/2022 1050   LDLDIRECT 134.8 01/27/2013 1516      Wt Readings from Last 3 Encounters:  12/22/22 138 lb (62.6 kg)  11/26/22  140 lb (63.5 kg)  10/22/22 140 lb (63.5 kg)      Other studies Reviewed: Additional studies/ records that were reviewed today include: ED records. Review of the above records demonstrates:  Please see elsewhere in the note.     ASSESSMENT AND PLAN:  Palpitations: This was likely related to the stress.  However, she does have a very borderline QT prolongation.  I told her about this and to avoid QT prolonging drugs.  I will have her wear a 2-week monitor.  I will check a basic metabolic profile.  If all of this is unremarkable and her symptoms are resolved and no further testing.  HTN: Her blood pressure is usually controlled.  No change in therapy.  Prolonged QT: As above  Aortic insufficiency: Has been 7 years I will check an cardiogram.  Elevated coronary calcium: This was noted on a CT lung screening.  I reviewed this.  She had a negative stress test as mentioned years ago and she has no ongoing symptoms.  She needs continued risk reduction.  LDL is 93 with an HDL of 48.3.  I will suggest that she increase her Lipitor to 40 mg daily and get another lipid profile in 3 months.  Tobacco: I did recommend that she stop smoking.   Current medicines are reviewed at length with the patient today.  The patient does not have concerns regarding medicines.  The following changes have been made:  no change  Labs/ tests ordered today include:   Orders Placed This Encounter  Procedures   Basic metabolic panel   Lipid panel   LONG TERM MONITOR (3-14 DAYS)   EKG 12-Lead   ECHOCARDIOGRAM COMPLETE     Disposition:   FU with with me as needed.      Signed, Rollene Rotunda, MD  01/01/2023 3:28 PM    [facility]

## 2023-01-01 ENCOUNTER — Ambulatory Visit: Payer: Medicare Other | Attending: Cardiology | Admitting: Cardiology

## 2023-01-01 ENCOUNTER — Ambulatory Visit (INDEPENDENT_AMBULATORY_CARE_PROVIDER_SITE_OTHER): Payer: Medicare Other

## 2023-01-01 ENCOUNTER — Encounter: Payer: Self-pay | Admitting: Cardiology

## 2023-01-01 VITALS — BP 122/66 | HR 94 | Ht 63.0 in

## 2023-01-01 DIAGNOSIS — E785 Hyperlipidemia, unspecified: Secondary | ICD-10-CM | POA: Diagnosis not present

## 2023-01-01 DIAGNOSIS — R002 Palpitations: Secondary | ICD-10-CM | POA: Diagnosis not present

## 2023-01-01 DIAGNOSIS — I351 Nonrheumatic aortic (valve) insufficiency: Secondary | ICD-10-CM | POA: Diagnosis not present

## 2023-01-01 MED ORDER — ATORVASTATIN CALCIUM 40 MG PO TABS: 40.0000 mg | ORAL_TABLET | Freq: Every day | ORAL | 3 refills | Status: DC

## 2023-01-01 NOTE — Patient Instructions (Addendum)
Medication Instructions:    Increase Atorvastatin 40 mg daily   *If you need a refill on your cardiac medications before your next appointment, please call your pharmacy*   Lab Work:  BMP -today     Lipid in 3 months - fasting    If you have labs (blood work) drawn today and your tests are completely normal, you will receive your results only by: MyChart Message (if you have MyChart) OR A paper copy in the mail If you have any lab test that is abnormal or we need to change your treatment, we will call you to review the results.   Testing/Procedures: Will be mailed  3 to 7 days  Your physician has recommended that you wear a holter monitor 14 day Zio  Holter monitors are medical devices that record the heart's electrical activity. Doctors most often use these monitors to diagnose arrhythmias. Arrhythmias are problems with the speed or rhythm of the heartbeat. The monitor is a small, portable device. You can wear one while you do your normal daily activities. This is usually used to diagnose what is causing palpitations/syncope (passing out).  And Will be schedule at [address] Your physician has requested that you have an echocardiogram. Echocardiography is a painless test that uses sound waves to create images of your heart. It provides your doctor with information about the size and shape of your heart and how well your heart's chambers and valves are working. This procedure takes approximately one hour. There are no restrictions for this procedure. Please do NOT wear cologne, perfume, aftershave, or lotions (deodorant is allowed). Please arrive 15 minutes prior to your appointment time.   Follow-Up: At [facility], you and your health needs are our priority.  As part of our continuing mission to provide you with exceptional heart care, we have created designated Provider Care Teams.  These Care Teams include your primary Cardiologist (physician) and  Advanced Practice Providers (APPs -  Physician Assistants and Nurse Practitioners) who all work together to provide you with the care you need, when you need it.  We recommend signing up for the patient portal called "MyChart".  Sign up information is provided on this After Visit Summary.  MyChart is used to connect with patients for Virtual Visits (Telemedicine).  Patients are able to view lab/test results, encounter notes, upcoming appointments, etc.  Non-urgent messages can be sent to your provider as well.   To learn more about what you can do with MyChart, go to [email or web address].    Your next appointment:   As needed   The format for your next appointment:   In Person  Provider:   Rollene Rotunda, MD    Other Instructions   Q.T. prolongation    Janet Pierce- Long Term Monitor Instructions  Your physician has requested you wear a ZIO patch monitor for 14 days.  This is a single patch monitor. [employer] supplies one patch monitor per enrollment. Additional stickers are not available. Please do not apply patch if you will be having a Nuclear Stress Test,  Echocardiogram, Cardiac CT, MRI, or Chest Xray during the period you would be wearing the  monitor. The patch cannot be worn during these tests. You cannot remove and re-apply the  ZIO XT patch monitor.  Your ZIO patch monitor will be mailed 3 day USPS to your address on file. It may take 3-5 days  to receive your monitor after you have been enrolled.  Once you have  received your monitor, please review the enclosed instructions. Your monitor  has already been registered assigning a specific monitor serial # to you.  Billing and Patient Assistance Program Information  We have supplied [employer] with any of your insurance information on file for billing purposes. [employer] offers a sliding scale Patient Assistance Program for patients that do not have  insurance, or whose insurance does not completely cover the cost of the ZIO  monitor.  You must apply for the Patient Assistance Program to qualify for this discounted rate.  To apply, please call [employer] at [phone], select option 4, select option 2, ask to apply for  Patient Assistance Program. Janet Pierce will ask your household income, and how many people  are in your household. They will quote your out-of-pocket cost based on that information.  [employer] will also be able to set up a 85-month, interest-free payment plan if needed.  Applying the monitor   Shave hair from upper left chest.  Hold abrader disc by orange tab. Rub abrader in 40 strokes over the upper left chest as  indicated in your monitor instructions.  Clean area with 4 enclosed alcohol pads. Let dry.  Apply patch as indicated in monitor instructions. Patch will be placed under collarbone on left  side of chest with arrow pointing upward.  Rub patch adhesive wings for 2 minutes. Remove white label marked "1". Remove the white  label marked "2". Rub patch adhesive wings for 2 additional minutes.  While looking in a mirror, press and release button in center of patch. A small green light will  flash 3-4 times. This will be your only indicator that the monitor has been turned on.  Do not shower for the first 24 hours. You may shower after the first 24 hours.  Press the button if you feel a symptom. You will hear a small click. Record Date, Time and  Symptom in the Patient Logbook.  When you are ready to remove the patch, follow instructions on the last 2 pages of Patient  Logbook. Stick patch monitor onto the last page of Patient Logbook.  Place Patient Logbook in the blue and white box. Use locking tab on box and tape box closed  securely. The blue and white box has prepaid postage on it. Please place it in the mailbox as  soon as possible. Your physician should have your test results approximately 7 days after the  monitor has been mailed back to [facility].  Call [facility] Customer Care at  [phone] if you have questions regarding  your ZIO XT patch monitor. Call them immediately if you see an orange light blinking on your  monitor.  If your monitor falls off in less than 4 days, contact our Monitor department at [phone].  If your monitor becomes loose or falls off after 4 days call [employer] at [phone] for  suggestions on securing your monitor

## 2023-01-01 NOTE — Progress Notes (Unsigned)
Enrolled for [employer] to mail a ZIO XT long term holter monitor to the patients address on file.  

## 2023-01-02 LAB — BASIC METABOLIC PANEL
BUN/Creatinine Ratio: 15 (ref 12–28)
BUN: 10 mg/dL (ref 8–27)
CO2: 29 mmol/L (ref 20–29)
Calcium: 9.3 mg/dL (ref 8.7–10.3)
Chloride: 98 mmol/L (ref 96–106)
Creatinine, Ser: 0.66 mg/dL (ref 0.57–1.00)
Glucose: 74 mg/dL (ref 70–99)
Potassium: 3.7 mmol/L (ref 3.5–5.2)
Sodium: 142 mmol/L (ref 134–144)
eGFR: 94 mL/min/{1.73_m2} (ref >59)

## 2023-01-07 DIAGNOSIS — R002 Palpitations: Secondary | ICD-10-CM | POA: Diagnosis not present

## 2023-01-08 ENCOUNTER — Ambulatory Visit: Payer: Medicare Other | Admitting: Family Medicine

## 2023-01-08 ENCOUNTER — Encounter: Payer: Self-pay | Admitting: Family Medicine

## 2023-01-08 VITALS — BP 110/64 | HR 87 | Temp 97.5°F | Ht 63.0 in | Wt 138.8 lb

## 2023-01-08 DIAGNOSIS — L309 Dermatitis, unspecified: Secondary | ICD-10-CM

## 2023-01-08 DIAGNOSIS — M25512 Pain in left shoulder: Secondary | ICD-10-CM | POA: Diagnosis not present

## 2023-01-08 MED ORDER — TRIAMCINOLONE ACETONIDE 0.1 % EX OINT: 1.0000 | TOPICAL_OINTMENT | Freq: Two times a day (BID) | CUTANEOUS | 1 refills | Status: DC

## 2023-01-08 NOTE — Patient Instructions (Signed)
Follow up as needed or as scheduled USE the Triamcinolone ointment twice daily on the dry patches We'll call you to schedule your physical therapy Call with any questions or concerns Hang in there!  You look great!!

## 2023-01-08 NOTE — Progress Notes (Signed)
Subjective:    Patient ID: Janet Pierce, female    DOB: December 04, 1950, 72 y.o.   MRN: [id number]  HPI Skin changes- at ankles bilaterally, dry, hyperpigmented.  No itching.  Skin will flake.  Using cocoa butter.    L shoulder pain- pt has hx of similar.  Would like to restart PT at [facility].  Needs referral.   Review of Systems For ROS see HPI     Objective:   Physical Exam Vitals reviewed.  Constitutional:      General: She is not in acute distress.    Appearance: Normal appearance. She is not ill-appearing.  HENT:     Head: Normocephalic and atraumatic.  Eyes:     Extraocular Movements: Extraocular movements intact.     Conjunctiva/sclera: Conjunctivae normal.  Skin:    General: Skin is warm and dry.     Comments: Eczematous patches of medial ankles bilaterally  Neurological:     General: No focal deficit present.     Mental Status: She is alert and oriented to person, place, and time.  Psychiatric:        Mood and Affect: Mood normal.        Behavior: Behavior normal.        Thought Content: Thought content normal.           Assessment & Plan:

## 2023-01-09 ENCOUNTER — Encounter: Payer: Self-pay | Admitting: *Deleted

## 2023-01-11 NOTE — Assessment & Plan Note (Signed)
Pt has hx of similar.  Restart steroid ointment BID prn.  Pt expressed understanding and is in agreement w/ plan.

## 2023-01-11 NOTE — Assessment & Plan Note (Signed)
Recurrent problem for pt.  Refer back to PT as pt found this helpful previously.

## 2023-01-21 ENCOUNTER — Ambulatory Visit (HOSPITAL_COMMUNITY): Payer: Medicare Other | Attending: Cardiology

## 2023-01-21 DIAGNOSIS — I351 Nonrheumatic aortic (valve) insufficiency: Secondary | ICD-10-CM | POA: Diagnosis present

## 2023-01-21 LAB — ECHOCARDIOGRAM COMPLETE
Area-P 1/2: 4.86 cm2
P 1/2 time: 875 ms
S' Lateral: 2.9 cm

## 2023-01-26 ENCOUNTER — Other Ambulatory Visit: Payer: Self-pay | Admitting: Family Medicine

## 2023-02-04 ENCOUNTER — Encounter: Payer: Self-pay | Admitting: *Deleted

## 2023-03-16 ENCOUNTER — Telehealth: Payer: Self-pay | Admitting: Family Medicine

## 2023-03-16 NOTE — Telephone Encounter (Signed)
Caller name: Dejanna Sabourin  On DPR?: Yes  Call back number: [phone] (mobile)  Provider they see: Sheliah Hatch, MD  Reason for call:  Pt has taken Mucinex and Tylenol for congestion and is still having congestion

## 2023-03-16 NOTE — Telephone Encounter (Signed)
Left vm to call office. Pt will need appt. To be seen for this.

## 2023-03-16 NOTE — Telephone Encounter (Signed)
Pt already has appt on 11/26 to be seen for this issue

## 2023-03-17 ENCOUNTER — Ambulatory Visit: Payer: Medicare Other | Admitting: Family Medicine

## 2023-03-17 ENCOUNTER — Encounter: Payer: Self-pay | Admitting: Family Medicine

## 2023-03-17 VITALS — BP 100/60 | HR 103 | Temp 97.8°F | Ht 63.0 in | Wt 136.5 lb

## 2023-03-17 DIAGNOSIS — R051 Acute cough: Secondary | ICD-10-CM | POA: Diagnosis not present

## 2023-03-17 DIAGNOSIS — J069 Acute upper respiratory infection, unspecified: Secondary | ICD-10-CM | POA: Diagnosis not present

## 2023-03-17 LAB — POC INFLUENZA A&B (BINAX/QUICKVUE)
Influenza A, POC: NEGATIVE — AB
Influenza B, POC: NEGATIVE

## 2023-03-17 LAB — POC COVID19 BINAXNOW: SARS Coronavirus 2 Ag: NEGATIVE

## 2023-03-17 MED ORDER — BENZONATATE 200 MG PO CAPS: 200.0000 mg | ORAL_CAPSULE | Freq: Two times a day (BID) | ORAL | 0 refills | Status: DC | PRN

## 2023-03-17 MED ORDER — PROMETHAZINE-DM 6.25-15 MG/5ML PO SYRP: 5.0000 mL | ORAL_SOLUTION | Freq: Four times a day (QID) | ORAL | 0 refills | Status: DC | PRN

## 2023-03-17 NOTE — Progress Notes (Signed)
   Subjective:    Patient ID: Janet Pierce, female    DOB: [birth year], 72 y.o.   MRN: [id number]  HPI URI- developed a sore throat on Friday.  Cough started on Saturday.  Developed congestion, neck soreness, HA.  COVID test negative yesterday.  + Fatigue.  Denies fever.  Denies facial pain/pressure.  Mild ear pain.  Cough is productive of small amount of sputum.  + decreased appetite.   Review of Systems For ROS see HPI     Objective:   Physical Exam Vitals reviewed.  Constitutional:      General: She is not in acute distress.    Appearance: Normal appearance. She is well-developed. She is not ill-appearing.  HENT:     Head: Normocephalic and atraumatic.     Right Ear: Tympanic membrane and ear canal normal.     Left Ear: Tympanic membrane and ear canal normal.     Nose: Congestion present. No rhinorrhea.     Comments: No TTP over frontal and maxillary sinuses Eyes:     Extraocular Movements: EOM normal.     Conjunctiva/sclera: Conjunctivae normal.     Pupils: Pupils are equal, round, and reactive to light.  Cardiovascular:     Rate and Rhythm: Normal rate and regular rhythm.     Heart sounds: Normal heart sounds. No murmur heard. Pulmonary:     Effort: Pulmonary effort is normal. No respiratory distress.     Breath sounds: Normal breath sounds. No wheezing.  Musculoskeletal:     Cervical back: Normal range of motion and neck supple.  Lymphadenopathy:     Cervical: No cervical adenopathy.  Skin:    General: Skin is warm and dry.  Neurological:     General: No focal deficit present.     Mental Status: She is alert and oriented to person, place, and time.  Psychiatric:        Mood and Affect: Mood normal.        Behavior: Behavior normal.           Assessment & Plan:  URI- new.  Pt w/o obvious sign of bacterial infxn.  No need for abx as this appears to be viral.  Cough meds prn.  Reviewed supportive care and red flags that should prompt return.  Pt expressed  understanding and is in agreement w/ plan.

## 2023-03-17 NOTE — Patient Instructions (Signed)
Follow up as needed or as scheduled START the cough pills and cough syrup as needed Drink LOTS of fluids REST! Eat small amounts throughout the day to keep your sugar from dropping Call with any questions or concerns Stay Safe! Happy Holidays!!!

## 2023-03-18 ENCOUNTER — Telehealth: Payer: Self-pay | Admitting: Family Medicine

## 2023-03-18 ENCOUNTER — Telehealth: Payer: Self-pay | Admitting: Physical Medicine and Rehabilitation

## 2023-03-18 NOTE — Telephone Encounter (Signed)
Pt called to set and appt for left hip injection with Dr Alvester Morin. Please call pt at [phone].

## 2023-03-18 NOTE — Telephone Encounter (Signed)
Spoke to patient and she is aware it's ok to continue mucinex as needed.

## 2023-03-18 NOTE — Telephone Encounter (Signed)
Caller name: Elexys Henley  On DPR?: Yes  Call back number: [phone] (home)  Provider they see: Sheliah Hatch, MD  Reason for call:   Pt would like to know can she still take the Mucenix for the phlegm because she feels it really clears it up?- advise

## 2023-03-18 NOTE — Telephone Encounter (Signed)
Ok to continue Mucinex as needed

## 2023-03-18 NOTE — Telephone Encounter (Signed)
I see you gave her cough medicine yesterday, patient wants to know if it's ok to continue the mucinex.

## 2023-03-19 ENCOUNTER — Other Ambulatory Visit: Payer: Self-pay

## 2023-03-19 ENCOUNTER — Emergency Department (HOSPITAL_COMMUNITY): Payer: Medicare Other

## 2023-03-19 ENCOUNTER — Encounter (HOSPITAL_COMMUNITY): Payer: Self-pay

## 2023-03-19 ENCOUNTER — Emergency Department (HOSPITAL_COMMUNITY)
Admission: EM | Admit: 2023-03-19 | Discharge: 2023-03-19 | Disposition: A | Discharge status: Home / Self Care | Payer: Medicare Other

## 2023-03-19 DIAGNOSIS — I10 Essential (primary) hypertension: Secondary | ICD-10-CM | POA: Diagnosis not present

## 2023-03-19 DIAGNOSIS — Z79899 Other long term (current) drug therapy: Secondary | ICD-10-CM | POA: Insufficient documentation

## 2023-03-19 DIAGNOSIS — R059 Cough, unspecified: Secondary | ICD-10-CM | POA: Diagnosis present

## 2023-03-19 DIAGNOSIS — J40 Bronchitis, not specified as acute or chronic: Secondary | ICD-10-CM | POA: Insufficient documentation

## 2023-03-19 LAB — BASIC METABOLIC PANEL
Anion gap: 10 (ref 5–15)
BUN: 7 mg/dL — ABNORMAL LOW (ref 8–23)
CO2: 28 mmol/L (ref 22–32)
Calcium: 8.8 mg/dL — ABNORMAL LOW (ref 8.9–10.3)
Chloride: 101 mmol/L (ref 98–111)
Creatinine, Ser: 0.67 mg/dL (ref 0.44–1.00)
GFR, Estimated: >60 mL/min (ref >60)
Glucose, Bld: 100 mg/dL — ABNORMAL HIGH (ref 70–99)
Potassium: 3.5 mmol/L (ref 3.5–5.1)
Sodium: 139 mmol/L (ref 135–145)

## 2023-03-19 LAB — CBC
HCT: 37.3 % (ref 36.0–46.0)
Hemoglobin: 12.3 g/dL (ref 12.0–15.0)
MCH: 27.3 pg (ref 26.0–34.0)
MCHC: 33 g/dL (ref 30.0–36.0)
MCV: 82.9 fL (ref 80.0–100.0)
Platelets: 222 10*3/uL (ref 150–400)
RBC: 4.5 MIL/uL (ref 3.87–5.11)
RDW: 13.2 % (ref 11.5–15.5)
WBC: 2.6 10*3/uL — ABNORMAL LOW (ref 4.0–10.5)
nRBC: 0 % (ref 0.0–0.2)

## 2023-03-19 LAB — TROPONIN I (HIGH SENSITIVITY): Troponin I (High Sensitivity): 4 ng/L (ref <18)

## 2023-03-19 MED ORDER — ALBUTEROL SULFATE HFA 108 (90 BASE) MCG/ACT IN AERS: 1.0000 | INHALATION_SPRAY | Freq: Four times a day (QID) | RESPIRATORY_TRACT | 0 refills | Status: DC | PRN

## 2023-03-19 MED ORDER — IPRATROPIUM-ALBUTEROL 0.5-2.5 (3) MG/3ML IN SOLN
3.0000 mL | Freq: Once | RESPIRATORY_TRACT | Status: AC
  Administered 2023-03-19: 3 mL via RESPIRATORY_TRACT
  Filled 2023-03-19: qty 3

## 2023-03-19 MED ORDER — AMOXICILLIN-POT CLAVULANATE 875-125 MG PO TABS
1.0000 | ORAL_TABLET | Freq: Once | ORAL | Status: AC
  Administered 2023-03-19: 1 via ORAL
  Filled 2023-03-19: qty 1

## 2023-03-19 MED ORDER — AMOXICILLIN-POT CLAVULANATE 875-125 MG PO TABS: 1.0000 | ORAL_TABLET | Freq: Two times a day (BID) | ORAL | 0 refills | Status: DC

## 2023-03-19 MED ORDER — SODIUM CHLORIDE 0.9 % IV BOLUS
500.0000 mL | Freq: Once | INTRAVENOUS | Status: AC
  Administered 2023-03-19: 500 mL via INTRAVENOUS

## 2023-03-19 NOTE — Discharge Instructions (Addendum)
Please take the antibiotic as prescribed and use 2 puffs of the albuterol every 4 hours over the next day or 2.  Please follow-up with your doctor.  Return to the ER for worsening symptoms.

## 2023-03-19 NOTE — ED Provider Notes (Addendum)
[place] EMERGENCY DEPARTMENT AT [facility] Provider Note   CSN: [id number] Arrival date & time: 03/19/23  1057     History  Chief Complaint  Patient presents with   Dizziness    Janet Pierce is a 72 y.o. female.  72 year old female with past medical history of hypertension and hyperlipidemia presenting to the emergency department today with nasal congestion and cough.  The patient states that she has been having the symptoms now for 1-1/2 weeks.  She states that she woke up this morning is having some feelings of generalized weakness.  She reports that she was feeling some "chest heaviness" this morning when she got up early this morning.  She states that she has had a cough productive of whitish sputum.  She reports that this is occasionally blood-tinged.  She states that she has noticed some reddish/pink streaking to this at times.  She denies any pleuritic chest pain.  Denies any leg pain or swelling.  She followed up with her doctor yesterday and was started on steroids.  She was also given some cough medication.  She states that she was persistently having symptoms today which is why she came to the ER for further evaluation.  She denies a history of DVT or pulmonary embolism, recent surgeries, recent travel.   Dizziness      Home Medications Prior to Admission medications   Medication Sig Start Date End Date Taking? Authorizing Provider  albuterol (VENTOLIN HFA) 108 (90 Base) MCG/ACT inhaler Inhale 1-2 puffs into the lungs every 6 (six) hours as needed for wheezing or shortness of breath. 03/19/23  Yes Durwin Glaze, MD  amoxicillin-clavulanate (AUGMENTIN) 875-125 MG tablet Take 1 tablet by mouth every 12 (twelve) hours. 03/19/23  Yes Durwin Glaze, MD  albuterol (VENTOLIN HFA) 108 (90 Base) MCG/ACT inhaler Inhale 2 puffs into the lungs every 6 (six) hours as needed for wheezing or shortness of breath. 07/02/22   Sheliah Hatch, MD  amLODipine (NORVASC) 10 MG  tablet TAKE 1 TABLET DAILY BEFORE BREAKFAST 01/26/23   Sheliah Hatch, MD  atorvastatin (LIPITOR) 40 MG tablet Take 1 tablet (40 mg total) by mouth daily. 01/01/23 04/01/23  Rollene Rotunda, MD  benzonatate (TESSALON) 200 MG capsule Take 1 capsule (200 mg total) by mouth 2 (two) times daily as needed for cough. 03/17/23   Sheliah Hatch, MD  brimonidine (ALPHAGAN P) 0.1 % SOLN Place 1 drop into the right eye in the morning and at bedtime.    [provider]  cetirizine (ZYRTEC) 10 MG tablet Take 1 tablet (10 mg total) by mouth daily. 07/02/22   Sheliah Hatch, MD  dorzolamide (TRUSOPT) 2 % ophthalmic solution INSTILL 1 DROP INTO BOTH EYES BID 05/08/17   [provider]  LUMIGAN 0.01 % SOLN 1 drop at bedtime. 03/13/20   [provider]  Multiple Vitamins-Minerals (MULTIVITAMIN GUMMIES WOMENS) CHEW Chew 2 Doses by mouth every morning.    [provider]  pantoprazole (PROTONIX) 40 MG tablet Take 1 tablet (40 mg total) by mouth daily. 06/16/22   Sheliah Hatch, MD  predniSONE (DELTASONE) 10 MG tablet 3 tabs x3 days and then 2 tabs x3 days and then 1 tab x3 days.  Take w/ food. 10/17/22   Sheliah Hatch, MD  promethazine-dextromethorphan (PROMETHAZINE-DM) 6.25-15 MG/5ML syrup Take 5 mLs by mouth 4 (four) times daily as needed. 03/17/23   Sheliah Hatch, MD  timolol (TIMOPTIC) 0.5 % ophthalmic solution Place 1 drop  into the right eye 2 (two) times daily.  09/13/13   [provider]  triamcinolone ointment (KENALOG) 0.1 % Apply 1 Application topically 2 (two) times daily. 01/08/23 01/08/24  Sheliah Hatch, MD  Vitamin D, Ergocalciferol, (DRISDOL) 1.25 MG (50000 UNIT) CAPS capsule Take 1 capsule (50,000 Units total) by mouth every 7 (seven) days. 12/01/22   Sheliah Hatch, MD  XDEMVY 0.25 % SOLN Apply 1 drop to eye 2 (two) times daily. 06/04/22   [provider]      Allergies    Patient has no known allergies.    Review  of Systems   Review of Systems  HENT:  Positive for congestion.   Respiratory:  Positive for cough.   Neurological:  Positive for dizziness.  All other systems reviewed and are negative.   Physical Exam Updated Vital Signs BP 120/76 (BP Location: Left Arm)   Pulse 77   Temp 98.4 F (36.9 C) (Oral)   Resp 16   Ht 5\' 3"  (1.6 m)   Wt 61.9 kg   SpO2 99%   BMI 24.18 kg/m  Physical Exam Vitals and nursing note reviewed.   Gen: NAD Eyes: PERRL, EOMI HEENT: no oropharyngeal swelling Neck: trachea midline Resp: Scattered coarse breath sounds on the left with wheezes noted Card: RRR, no murmurs, rubs, or gallops Abd: nontender, nondistended Extremities: no calf tenderness, no edema Vascular: 2+ radial pulses bilaterally, 2+ DP pulses bilaterally Skin: no rashes Psyc: acting appropriately   ED Results / Procedures / Treatments   Labs (all labs ordered are listed, but only abnormal results are displayed) Labs Reviewed  BASIC METABOLIC PANEL - Abnormal; Notable for the following components:      Result Value   Glucose, Bld 100 (*)    BUN 7 (*)    Calcium 8.8 (*)    All other components within normal limits  CBC - Abnormal; Notable for the following components:   WBC 2.6 (*)    All other components within normal limits  TROPONIN I (HIGH SENSITIVITY)    EKG EKG Interpretation Date/Time:  Thursday March 19 2023 11:03:50 EST Ventricular Rate:  88 PR Interval:  182 QRS Duration:  96 QT Interval:  402 QTC Calculation: 487 R Axis:   15  Text Interpretation: Sinus rhythm Low voltage, precordial leads Borderline abnrm T, anterolateral leads Borderline prolonged QT interval Baseline wander in lead(s) V6 Confirmed by Beckey Downing [id number]) on 03/19/2023 1:17:44 PM  Radiology DG Chest Port 1 View  Result Date: 03/19/2023 CLINICAL DATA:  Chest pain EXAM: PORTABLE CHEST 1 VIEW COMPARISON:  None Available. FINDINGS: The heart size and mediastinal contours are within normal  limits. Aortic atherosclerosis. Both lungs are clear. Severe S-shaped thoracolumbar spinal curvature. IMPRESSION: No active disease. Electronically Signed   By: Duanne Guess D.O.   On: 03/19/2023 11:29    Procedures Procedures    Medications Ordered in ED Medications  ipratropium-albuterol (DUONEB) 0.5-2.5 (3) MG/3ML nebulizer solution 3 mL (3 mLs Nebulization Given 03/19/23 1126)  sodium chloride 0.9 % bolus 500 mL (0 mLs Intravenous Stopped 03/19/23 1236)  amoxicillin-clavulanate (AUGMENTIN) 875-125 MG per tablet 1 tablet (1 tablet Oral Given 03/19/23 1259)    ED Course/ Medical Decision Making/ A&P                                 Medical Decision Making 72 year old female with past medical history of hypertension and hyperlipidemia presenting  to the emergency department today with chest discomfort as well as productive cough and nasal congestion over the past week and a half.  The patient's vital signs here are reassuring.  I will further evaluate her here with basic labs Wels and EKG, chest x-ray, and troponin for further evaluation for ACS, pulmonary edema, pulmonary infiltrates, or pneumothorax.  The patient does have wheezing and some coarse breath sounds here on exam.  I will give the patient a DuoNeb here.  I suspect that this is likely due to bronchitis or pneumonia.  Given the symptoms now for 1-1/2 weeks I will treat the patient with antibiotics.  Will give her some IV fluids here as well.  Suspicion for pulmonary embolism is low here based on description of her symptoms and reassuring vital signs.  I will reevaluate for ultimate disposition.  The patient's workup here is reassuring.  EKG does not show any ischemic changes.  Her troponin here is negative.  The patient was ambulatory here and her pulse ox today in the high 90s.  I think she is stable for discharge.  Amount and/or Complexity of Data Reviewed Labs: ordered. Radiology: ordered.  Risk Prescription drug  management.           Final Clinical Impression(s) / ED Diagnoses Final diagnoses:  Bronchitis    Rx / DC Orders ED Discharge Orders          Ordered    albuterol (VENTOLIN HFA) 108 (90 Base) MCG/ACT inhaler  Every 6 hours PRN        03/19/23 1316    amoxicillin-clavulanate (AUGMENTIN) 875-125 MG tablet  Every 12 hours        03/19/23 1316              Durwin Glaze, MD 03/19/23 1317    Durwin Glaze, MD 03/19/23 1317

## 2023-03-19 NOTE — ED Triage Notes (Signed)
Patient reports cough and congestion x 1.5 weeks. Reports dizziness and chest tightness x 2 days. States she has been to her PCP twice and had negative COVID and flu tests.

## 2023-03-30 ENCOUNTER — Ambulatory Visit: Payer: Medicare Other | Admitting: Family Medicine

## 2023-03-30 ENCOUNTER — Encounter: Payer: Self-pay | Admitting: Family Medicine

## 2023-03-30 VITALS — BP 112/64 | HR 72 | Temp 97.9°F | Ht 63.0 in | Wt 137.4 lb

## 2023-03-30 DIAGNOSIS — R0989 Other specified symptoms and signs involving the circulatory and respiratory systems: Secondary | ICD-10-CM

## 2023-03-30 MED ORDER — CETIRIZINE HCL 10 MG PO TABS: 10.0000 mg | ORAL_TABLET | Freq: Every day | ORAL | 6 refills | Status: DC

## 2023-03-30 NOTE — Patient Instructions (Signed)
Follow up as needed or as scheduled START the Cetirizine (Zyrtec) daily to decrease post nasal drip and phlegm ADD OTC Mucinex to help thin the phlegm and make it easier to get up Drink LOTS of fluids Call with any questions or concerns Happy [birth year] Birthday!!!

## 2023-03-30 NOTE — Progress Notes (Signed)
   Subjective:    Patient ID: Janet Pierce, female    DOB: April 07, 1951, 72 y.o.   MRN: [id number]  HPI ER f/u- pt was seen in our office on 11/26 for viral URI.  She went to the ED on 11/28 and was dx'd w/ bronchitis despite a clear CXR and started on Augmentin.  WBC was 2.6- consistent w/ viral suppression.  She told them at that time she felt very weak got IVF.  Pt is here today to f/u that ER visit.  Pt reports overall feeling better but notes that she wakes each morning w/ 'phlegm'.     Review of Systems For ROS see HPI     Objective:   Physical Exam Vitals reviewed.  Constitutional:      General: She is not in acute distress.    Appearance: Normal appearance. She is well-developed. She is not ill-appearing.  HENT:     Head: Normocephalic and atraumatic.     Right Ear: Tympanic membrane normal.     Left Ear: Tympanic membrane normal.     Nose: Mucosal edema and congestion present. No rhinorrhea.     Right Sinus: No maxillary sinus tenderness or frontal sinus tenderness.     Left Sinus: No maxillary sinus tenderness or frontal sinus tenderness.     Mouth/Throat:     Pharynx: Posterior oropharyngeal erythema (w/ PND) present.  Eyes:     Conjunctiva/sclera: Conjunctivae normal.     Pupils: Pupils are equal, round, and reactive to light.  Cardiovascular:     Rate and Rhythm: Normal rate and regular rhythm.     Heart sounds: Normal heart sounds.  Pulmonary:     Effort: Pulmonary effort is normal. No respiratory distress.     Breath sounds: Normal breath sounds. No wheezing or rales.  Musculoskeletal:     Cervical back: Normal range of motion and neck supple.  Lymphadenopathy:     Cervical: No cervical adenopathy.  Skin:    General: Skin is warm and dry.  Neurological:     General: No focal deficit present.     Mental Status: She is alert and oriented to person, place, and time.  Psychiatric:        Mood and Affect: Mood normal.        Behavior: Behavior normal.         Thought Content: Thought content normal.           Assessment & Plan:  Phlegm in throat- pt reports cough and weakness improved and she is back to her usual state of health w/ exception of persistent AM phlegm.  Discussed need to stop smoking.  Encouraged her to start daily antihistamine to improve PND and decrease amount of AM congestion/sputum.  Pt expressed understanding and is in agreement w/ plan.

## 2023-04-17 NOTE — Telephone Encounter (Signed)
I called, office visit scheduled for 04/24/2023 at 1015.

## 2023-04-24 ENCOUNTER — Ambulatory Visit: Payer: Medicare Other | Admitting: Physical Medicine and Rehabilitation

## 2023-05-12 ENCOUNTER — Telehealth: Payer: Self-pay | Admitting: Family Medicine

## 2023-05-12 NOTE — Telephone Encounter (Signed)
Please see note below.  Copied from CRM [id number]. Topic: Clinical - Prescription Issue >> May 12, 2023  1:28 PM Florestine Avers wrote: Reason for CRM: Patient called instating that she called in prior to request a refill on a muscle relaxer. Patient could not recall the name if the medication, I did not see the medication on file, or a piror CRM requesting the refill.

## 2023-05-13 MED ORDER — TIZANIDINE HCL 4 MG PO TABS: 4.0000 mg | ORAL_TABLET | Freq: Four times a day (QID) | ORAL | 0 refills | Status: AC | PRN

## 2023-05-13 NOTE — Telephone Encounter (Signed)
Please advise. I see a old rx for Flexril 10mg .

## 2023-05-13 NOTE — Telephone Encounter (Signed)
Left vm to call office

## 2023-05-13 NOTE — Telephone Encounter (Signed)
Prescription for Tizanidine sent to [facility]

## 2023-05-14 ENCOUNTER — Ambulatory Visit
Admission: RE | Admit: 2023-05-14 | Discharge: 2023-05-14 | Disposition: A | Discharge status: Home / Self Care | Payer: Medicare Other | Source: Ambulatory Visit | Attending: Family Medicine | Admitting: Family Medicine

## 2023-05-14 DIAGNOSIS — R911 Solitary pulmonary nodule: Secondary | ICD-10-CM

## 2023-05-14 DIAGNOSIS — Z87891 Personal history of nicotine dependence: Secondary | ICD-10-CM

## 2023-05-15 NOTE — Telephone Encounter (Signed)
Patient was LM letting her know the prescription was sent to the pharmacy and to call if there are questions

## 2023-05-22 ENCOUNTER — Encounter: Payer: Self-pay | Admitting: *Deleted

## 2023-05-22 ENCOUNTER — Telehealth: Payer: Self-pay | Admitting: Acute Care

## 2023-05-22 DIAGNOSIS — R911 Solitary pulmonary nodule: Secondary | ICD-10-CM

## 2023-05-22 NOTE — Telephone Encounter (Signed)
Reviewed results of recent LDCT with Kandice Robinsons, NP who advises to contact patient and review results with recommendation to repeat LDCT in 3 months due to steady slow growth of nodule.  Would recommend to follow sooner than one year.  Update PCP and place orders once patient notified.

## 2023-05-22 NOTE — Telephone Encounter (Signed)
Attempted to contact pt to review CT results. VM box full and unable to leave a message. Letter mailed to pt asking her to contact us to review CT results.

## 2023-05-25 ENCOUNTER — Other Ambulatory Visit: Payer: Self-pay | Admitting: Family Medicine

## 2023-05-25 DIAGNOSIS — Z1231 Encounter for screening mammogram for malignant neoplasm of breast: Secondary | ICD-10-CM

## 2023-05-25 NOTE — Telephone Encounter (Signed)
Copied from CRM [id number]. Topic: Clinical - Medication Refill >> May 25, 2023  9:30 AM Theodis Sato wrote: Most Recent Primary Care Visit:  Provider: Sheliah Hatch  Department: [facility]  Visit Type: HOSPITAL FU  Date: 03/30/2023  Medication: amLODipine (NORVASC) 10 MG tablet  Has the patient contacted their pharmacy? Yes, mail delivery pharmacy wont be able to get this to her for another week.  (Agent: If no, request that the patient contact the pharmacy for the refill. If patient does not wish to contact the pharmacy document the reason why and proceed with request.) (Agent: If yes, when and what did the pharmacy advise?)  Is this the correct pharmacy for this prescription? Yes If no, delete pharmacy and type the correct one.  This is the patient's preferred pharmacy:   [facility] #[id number] [place], [place] - [phone] AT [facility] OF [facility] & [address] [place] [address] Phone: [phone] Fax: [phone]    Has the prescription been filled recently? Yes  Is the patient out of the medication? Yes  Has the patient been seen for an appointment in the last year OR does the patient have an upcoming appointment? Yes  Can we respond through MyChart? Yes  Agent: Please be advised that Rx refills may take up to 3 business days. We ask that you follow-up with your pharmacy.

## 2023-05-26 ENCOUNTER — Other Ambulatory Visit: Payer: Self-pay | Admitting: Family Medicine

## 2023-05-26 NOTE — Telephone Encounter (Signed)
Attempted to reach patient. VM remains full.

## 2023-05-26 NOTE — Telephone Encounter (Signed)
 Marland Kitchen

## 2023-05-26 NOTE — Telephone Encounter (Signed)
 Copied from CRM [id number]. Topic: Clinical - Prescription Issue >> May 26, 2023 11:57 AM Janet Pierce wrote: Reason for CRM: Patient called to verify if her medication amLODipine  (NORVASC ) 10 MG tablet has been sent to the correct pharmacy which we have on file as:   [facility] #[id number] [place], [place] - [address] AT [facility] OF [facility] & [address] [place] [address] Phone: [phone] Fax: [phone]  There is a pending CRM of the medication request. The patient is down to her last two pills and is requesting it gets filled today if possible.

## 2023-05-26 NOTE — Telephone Encounter (Signed)
 Last Fill: 01/26/23  Last OV: 03/30/23 Next OV: 01/05/24 AWV  Routing to provider for review/authorization.   Copied from CRM [id number]. Topic: Clinical - Prescription Issue >> May 26, 2023 11:57 AM Janet Pierce wrote: Reason for CRM: Patient called to verify if her medication amLODipine  (NORVASC ) 10 MG tablet has been sent to the correct pharmacy which we have on file as:   [id number] [place], [place] - [address] AT [facility] OF [facility] & [address] [place] [address] Phone: [phone] Fax: [phone]  There is a pending CRM of the medication request. The patient is down to her last two pills and is requesting it gets filled today if possible.

## 2023-05-27 ENCOUNTER — Other Ambulatory Visit: Payer: Self-pay | Admitting: Family Medicine

## 2023-05-27 NOTE — Telephone Encounter (Signed)
 Copied from CRM [id number]. Topic: Clinical - Medication Refill >> May 27, 2023  9:21 AM Russell PARAS wrote: Most Recent Primary Care Visit:  Provider: TABORI, KATHERINE E  Department: [facility]  Visit Type: HOSPITAL FU  Date: 03/30/2023  Medication: amLODipine  (NORVASC ) 10 MG tablet  Has the patient contacted their pharmacy? Yes (Agent: If no, request that the patient contact the pharmacy for the refill. If patient does not wish to contact the pharmacy document the reason why and proceed with request.) (Agent: If yes, when and what did the pharmacy advise?)  Is this the correct pharmacy for this prescription? Yes If no, delete pharmacy and type the correct one.  This is the patient's preferred pharmacy:   [facility] #[id number] [place], [place] - [phone] AT [facility] OF [facility] & [address] [place] [address] Phone: [phone] Fax: [phone]   Has the prescription been filled recently? Yes  Is the patient out of the medication? No  Has the patient been seen for an appointment in the last year OR does the patient have an upcoming appointment? Yes  Can we respond through MyChart? Yes  Agent: Please be advised that Rx refills may take up to 3 business days. We ask that you follow-up with your pharmacy.

## 2023-05-29 ENCOUNTER — Other Ambulatory Visit: Payer: Self-pay

## 2023-05-29 ENCOUNTER — Other Ambulatory Visit: Payer: Self-pay | Admitting: Family Medicine

## 2023-05-29 MED ORDER — AMLODIPINE BESYLATE 10 MG PO TABS: 10.0000 mg | ORAL_TABLET | Freq: Every day | ORAL | 3 refills | Status: DC

## 2023-05-29 MED ORDER — AMLODIPINE BESYLATE 10 MG PO TABS: 10.0000 mg | ORAL_TABLET | Freq: Every day | ORAL | 3 refills | Status: AC

## 2023-05-29 NOTE — Telephone Encounter (Signed)
 Called and was able to leave a VM for pt.

## 2023-05-29 NOTE — Telephone Encounter (Signed)
 Copied from CRM [id number]. Topic: Clinical - Prescription Issue >> May 28, 2023 12:05 PM Alexandria E wrote: Reason for CRM: Patient called stating that her amLODipine  (NORVASC ) 10 MG tablet was not at the [facility] when she went to go pick it up. Called [facility] and they advised me it was sent to a different pharmacy so I told the patient to give [facility] a call to see if her prescription was sent there. Will give us  a call back if they do not have it.

## 2023-05-29 NOTE — Telephone Encounter (Signed)
 Returned call to patient regarding LDCT results.  Per Lauraine Lites, NP review, plan is to repeat the CT scan in 3 months due to slow growth and this is a second repeat scan.  Patient has reviewed findings in mychart and is in agreement with the follow up CT in 3 months.  Patient has had no recent illness, having a respiratory illness between Thanksgiving and Christmas 2024 but feels well recovered now.  Atherosclerosis and emphysema, as previously seen. Results/plan to PCP and order placed for 3 month follow up CT.  Will call to schedule closer to April.

## 2023-05-29 NOTE — Addendum Note (Signed)
 Addended by: Deann Exon on: 05/29/2023 01:07 PM   Modules accepted: Orders

## 2023-06-06 ENCOUNTER — Other Ambulatory Visit: Payer: Self-pay | Admitting: Family Medicine

## 2023-06-06 DIAGNOSIS — R7989 Other specified abnormal findings of blood chemistry: Secondary | ICD-10-CM

## 2023-06-11 ENCOUNTER — Telehealth: Payer: Self-pay

## 2023-06-11 NOTE — Telephone Encounter (Signed)
Sent to Cardiologist (prescribing provider) and PCP as a [phone]

## 2023-06-15 MED ORDER — ATORVASTATIN CALCIUM 40 MG PO TABS: 40.0000 mg | ORAL_TABLET | Freq: Every day | ORAL | 3 refills | Status: DC

## 2023-06-15 NOTE — Addendum Note (Signed)
 Addended by: Jeannette How A on: 06/15/2023 09:17 AM   Modules accepted: Orders

## 2023-06-15 NOTE — Telephone Encounter (Signed)
 Patient called me and states that she is currently taking atorvastatin 40 mg daily.  She would like a refill sent to [facility].

## 2023-06-15 NOTE — Telephone Encounter (Signed)
 Left voicemail message for call back.  I need to clarify with patient if she taking atorvastatin or resuvastatin.

## 2023-06-16 ENCOUNTER — Telehealth: Payer: Self-pay

## 2023-06-16 NOTE — Telephone Encounter (Signed)
 Sent to Rollene Rotunda, MD and PCP

## 2023-06-18 NOTE — Telephone Encounter (Signed)
 Noted.

## 2023-06-26 ENCOUNTER — Ambulatory Visit
Admission: RE | Admit: 2023-06-26 | Discharge: 2023-06-26 | Disposition: A | Discharge status: Home / Self Care | Payer: Medicare Other | Source: Ambulatory Visit | Attending: Family Medicine | Admitting: Family Medicine

## 2023-06-26 DIAGNOSIS — Z1231 Encounter for screening mammogram for malignant neoplasm of breast: Secondary | ICD-10-CM

## 2023-08-13 ENCOUNTER — Ambulatory Visit
Admission: RE | Admit: 2023-08-13 | Discharge: 2023-08-13 | Disposition: A | Discharge status: Home / Self Care | Source: Ambulatory Visit | Attending: Family Medicine | Admitting: Family Medicine

## 2023-08-13 DIAGNOSIS — R911 Solitary pulmonary nodule: Secondary | ICD-10-CM

## 2023-08-22 ENCOUNTER — Other Ambulatory Visit: Payer: Self-pay | Admitting: Family Medicine

## 2023-08-22 DIAGNOSIS — R7989 Other specified abnormal findings of blood chemistry: Secondary | ICD-10-CM

## 2023-08-24 ENCOUNTER — Telehealth: Payer: Self-pay

## 2023-08-24 NOTE — Telephone Encounter (Signed)
 Copied from CRM [id number]. Topic: Clinical - Prescription Issue >> Aug 24, 2023  9:14 AM Orien Bird wrote: Reason for CRM: Patient called in about her vitamin d  2 and she stated it was denied and she is wanting to know why, does patient need a appt ?

## 2023-08-24 NOTE — Telephone Encounter (Signed)
 Called pt to discuss as she has not had her Vitamin D  level tested in several months we cannot refill high dose Vit D without testing, if she would like D3 she can get this over the counter at any pharmacy, call was not answered and the VM was full. Will attempt another call later

## 2023-08-25 NOTE — Telephone Encounter (Signed)
 Called and unable to leave vm due to being full.

## 2023-08-26 NOTE — Telephone Encounter (Signed)
 Mailbox is full- unable to leave vm Letter out

## 2023-09-02 ENCOUNTER — Other Ambulatory Visit: Payer: Self-pay | Admitting: Family Medicine

## 2023-09-02 ENCOUNTER — Other Ambulatory Visit: Payer: Self-pay | Admitting: *Deleted

## 2023-09-02 DIAGNOSIS — R7989 Other specified abnormal findings of blood chemistry: Secondary | ICD-10-CM

## 2023-09-02 DIAGNOSIS — R911 Solitary pulmonary nodule: Secondary | ICD-10-CM

## 2023-09-02 NOTE — Telephone Encounter (Signed)
 Denying prescription, called patient to clarify why we can not refill this medication, left VM to return call.

## 2023-09-03 NOTE — Telephone Encounter (Signed)
Patient would like a call back regarding her medication.

## 2023-09-03 NOTE — Telephone Encounter (Signed)
 Tried to call patient back and phone goes straight to VM. Unable to leave VM.

## 2023-09-04 ENCOUNTER — Other Ambulatory Visit: Payer: Self-pay | Admitting: Thoracic Surgery (Cardiothoracic Vascular Surgery)

## 2023-09-04 ENCOUNTER — Ambulatory Visit: Admitting: Thoracic Surgery (Cardiothoracic Vascular Surgery)

## 2023-09-04 ENCOUNTER — Ambulatory Visit
Admission: RE | Admit: 2023-09-04 | Discharge: 2023-09-04 | Disposition: A | Discharge status: Home / Self Care | Source: Ambulatory Visit | Attending: Thoracic Surgery (Cardiothoracic Vascular Surgery) | Admitting: Thoracic Surgery (Cardiothoracic Vascular Surgery)

## 2023-09-04 VITALS — BP 109/65 | HR 103 | Resp 18 | Ht 63.0 in | Wt 137.0 lb

## 2023-09-04 DIAGNOSIS — R911 Solitary pulmonary nodule: Secondary | ICD-10-CM

## 2023-09-04 DIAGNOSIS — F17210 Nicotine dependence, cigarettes, uncomplicated: Secondary | ICD-10-CM | POA: Insufficient documentation

## 2023-09-04 DIAGNOSIS — R942 Abnormal results of pulmonary function studies: Secondary | ICD-10-CM | POA: Diagnosis not present

## 2023-09-04 DIAGNOSIS — Z801 Family history of malignant neoplasm of trachea, bronchus and lung: Secondary | ICD-10-CM | POA: Insufficient documentation

## 2023-09-04 DIAGNOSIS — Z79899 Other long term (current) drug therapy: Secondary | ICD-10-CM | POA: Diagnosis not present

## 2023-09-04 LAB — PULMONARY FUNCTION TEST
DL/VA % pred: 99 %
DL/VA: 4.09 ml/min/mmHg/L
DLCO unc % pred: 60 %
DLCO unc: 11.61 ml/min/mmHg
FEF 25-75 Post: 1.26 L/s
FEF 25-75 Pre: 1.56 L/s
FEF2575-%Change-Post: -19 %
FEF2575-%Pred-Post: 69 %
FEF2575-%Pred-Pre: 86 %
FEV1-%Change-Post: -6 %
FEV1-%Pred-Post: 62 %
FEV1-%Pred-Pre: 67 %
FEV1-Post: 1.39 L
FEV1-Pre: 1.48 L
FEV1FVC-%Change-Post: 0 %
FEV1FVC-%Pred-Pre: 111 %
FEV6-%Change-Post: -7 %
FEV6-%Pred-Post: 58 %
FEV6-%Pred-Pre: 63 %
FEV6-Post: 1.64 L
FEV6-Pre: 1.76 L
FEV6FVC-%Pred-Post: 104 %
FEV6FVC-%Pred-Pre: 104 %
FVC-%Change-Post: -7 %
FVC-%Pred-Post: 55 %
FVC-%Pred-Pre: 60 %
FVC-Post: 1.64 L
FVC-Pre: 1.76 L
Post FEV1/FVC ratio: 85 %
Post FEV6/FVC ratio: 100 %
Pre FEV1/FVC ratio: 84 %
Pre FEV6/FVC Ratio: 100 %
RV % pred: 173 %
RV: 3.87 L
TLC % pred: 109 %
TLC: 5.53 L

## 2023-09-04 MED ORDER — ALBUTEROL SULFATE (2.5 MG/3ML) 0.083% IN NEBU
2.5000 mg | INHALATION_SOLUTION | Freq: Once | RESPIRATORY_TRACT | Status: AC
  Administered 2023-09-04: 2.5 mg via RESPIRATORY_TRACT

## 2023-09-04 NOTE — Progress Notes (Signed)
 [address]       [place] [address]             [phone]                    Daun Palmertree [facility] Medical Record #[id number] Date of Birth: [birth year]  Referring: Raejean Bullock, NP Primary Care: Jess Morita, MD Primary Cardiologist: Eilleen Grates, MD  Chief Complaint:    Chief Complaint  Patient presents with   Lung Lesion    CT chest 4/24    History of Present Illness:    Janet Pierce is a 73 y.o. female who presents for surgical evaluation of a  right middle lobe pulmonary nodule.  This was originally identified via the lung cancer screening program.  She stopped smoking in January of this year.  She denies any shortness of breath, or weight changes.    Past Medical History:  Diagnosis Date   Allergy    Anemia    Anxiety    Arthritis    Cataract    GERD (gastroesophageal reflux disease)    Glaucoma    Hyperlipidemia    Hypertension    Osteoporosis    Retinal detachment    left eye prosthesis   Scoliosis     Past Surgical History:  Procedure Laterality Date   BUNIONECTOMY Left    CATARACT EXTRACTION Right    EYE SURGERY Right    glucoma   FINGER SURGERY Right    ring finger   LIPOMA EXCISION     hip   RETINAL DETACHMENT SURGERY Left    TONSILLECTOMY      Family History  Problem Relation Age of Onset   Lung cancer Mother    Hypertension Mother    Bone cancer Mother    Liver cancer Mother    Cancer Father        brain   Other Sister        brain tumor   Hypertension Brother    Diabetes Maternal Grandmother    Fibroids Daughter    Colon cancer Neg Hx    Colon polyps Neg Hx    Esophageal cancer Neg Hx    Rectal cancer Neg Hx    Stomach cancer Neg Hx      Social History   Tobacco Use  Smoking Status Every Day   Current packs/day: 0.50   Average packs/day: 0.5 packs/day for 40.0 years (20.0 ttl pk-yrs)   Types: Cigarettes  Smokeless Tobacco Never  Tobacco Comments   Resumed smoking in Sept 2023     Social History   Substance and Sexual Activity  Alcohol Use Yes   Alcohol/week: 0.0 standard drinks of alcohol   Comment: occasionally     No Known Allergies  Current Outpatient Medications  Medication Sig Dispense Refill   albuterol  (VENTOLIN  HFA) 108 (90 Base) MCG/ACT inhaler Inhale 2 puffs into the lungs every 6 (six) hours as needed for wheezing or shortness of breath. 8 g 0   albuterol  (VENTOLIN  HFA) 108 (90 Base) MCG/ACT inhaler Inhale 1-2 puffs into the lungs every 6 (six) hours as needed for wheezing or shortness of breath. 1 each 0   amLODipine  (NORVASC ) 10 MG tablet Take 1 tablet (10 mg total) by mouth daily before breakfast. 90 tablet 3   amLODipine  (NORVASC ) 10 MG tablet Take 1 tablet (10 mg total) by mouth daily before breakfast. 90 tablet 3   amoxicillin -clavulanate (AUGMENTIN ) 875-125 MG  tablet Take 1 tablet by mouth every 12 (twelve) hours. 14 tablet 0   atorvastatin  (LIPITOR) 40 MG tablet Take 1 tablet (40 mg total) by mouth daily. 90 tablet 3   benzonatate  (TESSALON ) 200 MG capsule Take 1 capsule (200 mg total) by mouth 2 (two) times daily as needed for cough. 20 capsule 0   brimonidine (ALPHAGAN P) 0.1 % SOLN Place 1 drop into the right eye in the morning and at bedtime.     cetirizine  (ZYRTEC ) 10 MG tablet Take 1 tablet (10 mg total) by mouth daily. 30 tablet 6   dorzolamide (TRUSOPT) 2 % ophthalmic solution INSTILL 1 DROP INTO BOTH EYES BID  1   LUMIGAN 0.01 % SOLN 1 drop at bedtime.     Multiple Vitamins-Minerals (MULTIVITAMIN GUMMIES WOMENS) CHEW Chew 2 Doses by mouth every morning.     pantoprazole  (PROTONIX ) 40 MG tablet Take 1 tablet (40 mg total) by mouth daily. 90 tablet 1   timolol  (TIMOPTIC ) 0.5 % ophthalmic solution Place 1 drop into the right eye 2 (two) times daily.      tiZANidine  (ZANAFLEX ) 4 MG tablet Take 1 tablet (4 mg total) by mouth every 6 (six) hours as needed for muscle spasms. 30 tablet 0   triamcinolone  ointment (KENALOG ) 0.1 % Apply 1  Application topically 2 (two) times daily. 90 g 1   Vitamin D , Ergocalciferol , (DRISDOL ) 1.25 MG (50000 UNIT) CAPS capsule Take 1 capsule (50,000 Units total) by mouth every 7 (seven) days. 12 capsule 1   XDEMVY 0.25 % SOLN Apply 1 drop to eye 2 (two) times daily.     No current facility-administered medications for this visit.    Review of Systems  Constitutional:  Negative for malaise/fatigue and weight loss.  Respiratory:  Negative for cough and shortness of breath.   Cardiovascular:  Negative for chest pain.  Neurological: Negative.      PHYSICAL EXAMINATION: BP 109/65   Pulse (!) 103   Resp 18   Ht 5\' 3"  (1.6 m)   Wt 137 lb (62.1 kg)   SpO2 98%   BMI 24.27 kg/m  Physical Exam Constitutional:      General: She is not in acute distress.    Appearance: She is not ill-appearing.  HENT:     Head: Normocephalic and atraumatic.  Eyes:     Extraocular Movements: Extraocular movements intact.  Cardiovascular:     Rate and Rhythm: Tachycardia present.  Pulmonary:     Effort: Pulmonary effort is normal. No respiratory distress.  Abdominal:     General: Abdomen is flat. There is no distension.  Musculoskeletal:        General: Normal range of motion.     Cervical back: Normal range of motion.  Skin:    General: Skin is warm and dry.  Neurological:     General: No focal deficit present.     Mental Status: She is alert and oriented to person, place, and time.          I have independently reviewed the above radiology studies  and reviewed the findings with the patient.   Recent Lab Findings: Lab Results  Component Value Date   WBC 2.6 (L) 03/19/2023   HGB 12.3 03/19/2023   HCT 37.3 03/19/2023   PLT 222 03/19/2023   GLUCOSE 100 (H) 03/19/2023   CHOL 162 08/21/2022   TRIG 104.0 08/21/2022   HDL 48.30 08/21/2022   LDLDIRECT 134.8 01/27/2013   LDLCALC 93 08/21/2022   ALT  26 08/21/2022   AST 25 08/21/2022   NA 139 03/19/2023   K 3.5 03/19/2023   CL 101  03/19/2023   CREATININE 0.67 03/19/2023   BUN 7 (L) 03/19/2023   CO2 28 03/19/2023   TSH 2.50 08/21/2022   HGBA1C 5.9 08/08/2020    Diagnostic Studies & Laboratory data:     Recent Radiology Findings:   No results found.   PFTs:  - FVC: 55% - FEV1: 62% -DLCO: 60%    Assessment / Plan:   73 y.o. female with a right middle lobe pulmonary nodule that has been followed through the lung cancer screening program.  The most recent results show that there is solid component that is developing.  I explained to her that this is concerning for primary lung cancer.  I have ordered a PET/CT, and recommended that she undergo a single anesthetic event which will include robotic bronchoscopy with marking, followed by a robotic assisted right thoracoscopy with wedge resection of the right middle lobe and possible lobectomy.  Once her PET/CT has been resulted I will discuss the findings with her.  She will also require stress test prior to surgery.     I  spent 40 minutes with  the patient face to face in counseling and coordination of care.    Hilarie Lovely 09/04/2023 2:01 PM

## 2023-09-08 ENCOUNTER — Telehealth: Payer: Self-pay

## 2023-09-08 NOTE — Telephone Encounter (Signed)
 Janet Pierce,   Pt is already scheduled for a PET on 09/16/2023 by Dr. Deloise Ferries. She is already seeing Thoracic.   Call report from [facility]  IMPRESSION: 1. Lung-RADS 4B, suspicious. Additional imaging evaluation or consultation with Pulmonology or Thoracic Surgery recommended. The mixed density nodule in the right middle lobe is unchanged ground-glass component, however slowly enlarging solid component, now 7 mm. 2. Two new areas of subpleural nodular density measuring 4.2 mm subpleural in the right lower lobe and 3.2 mm mean diameter in the left lower lobe, may simply represent areas of pleural thickening.   Aortic Atherosclerosis (ICD10-I70.0) and Emphysema (ICD10-J43.9).

## 2023-09-08 NOTE — Telephone Encounter (Signed)
 E[employer] agent called office stating that the patient was returning a call from [place] or Tonya. I looked in patient's chart and last phone call I saw was from 5/5 regarding her vitamin d . I let the agent know that's the last thing I saw and she could relay that info to her and set up an appt if patient would like to continue receiving high dose vitamin d .

## 2023-09-09 ENCOUNTER — Telehealth: Payer: Self-pay | Admitting: Acute Care

## 2023-09-09 NOTE — Telephone Encounter (Signed)
Results/ plans faxed to PCP.  

## 2023-09-09 NOTE — Telephone Encounter (Signed)
 I have called the patient with the results of the low dose Ct Chest. This was a 3 month follow up due to slow growth and increasingly solid center of a RML lung nodule. The scan was read as a 4 B, again due to increasing size of the solid component of the nodule. Scan was done 08/13/2023, and read 09/08/2023. Pt. Has been to see Dr. Deloise Ferries, and he has ordered PET scan and is potentially planning a single anesthesia procedure with navigational bronchoscopy with biopsy, then possible wedge resection if cytology appears positive. I am so glad she is getting the care she needs, and appreciate Dr. Deloise Ferries seeing her.  She verbalized understanding of the above. She had no further questions. I asked her to let us  know if she needs us  for anything at all.  Please fax results to PCP, let them know she is being actively followed by thoracic surgery, and has PET scan pending with possible biopsy and wedge resection if indicated.

## 2023-09-10 ENCOUNTER — Ambulatory Visit (INDEPENDENT_AMBULATORY_CARE_PROVIDER_SITE_OTHER): Admitting: Family Medicine

## 2023-09-10 ENCOUNTER — Encounter: Payer: Self-pay | Admitting: Family Medicine

## 2023-09-10 VITALS — BP 108/70 | HR 79 | Temp 98.8°F | Ht 64.0 in | Wt 135.8 lb

## 2023-09-10 DIAGNOSIS — E559 Vitamin D deficiency, unspecified: Secondary | ICD-10-CM | POA: Diagnosis not present

## 2023-09-10 DIAGNOSIS — I10 Essential (primary) hypertension: Secondary | ICD-10-CM

## 2023-09-10 DIAGNOSIS — E785 Hyperlipidemia, unspecified: Secondary | ICD-10-CM

## 2023-09-10 LAB — CBC WITH DIFFERENTIAL/PLATELET
Basophils Absolute: 0 10*3/uL (ref 0.0–0.1)
Basophils Relative: 1.1 % (ref 0.0–3.0)
Eosinophils Absolute: 0.1 10*3/uL (ref 0.0–0.7)
Eosinophils Relative: 2 % (ref 0.0–5.0)
HCT: 35.4 % — ABNORMAL LOW (ref 36.0–46.0)
Hemoglobin: 11.8 g/dL — ABNORMAL LOW (ref 12.0–15.0)
Lymphocytes Relative: 38.7 % (ref 12.0–46.0)
Lymphs Abs: 1.4 10*3/uL (ref 0.7–4.0)
MCHC: 33.4 g/dL (ref 30.0–36.0)
MCV: 81.8 fl (ref 78.0–100.0)
Monocytes Absolute: 0.4 10*3/uL (ref 0.1–1.0)
Monocytes Relative: 10.5 % (ref 3.0–12.0)
Neutro Abs: 1.8 10*3/uL (ref 1.4–7.7)
Neutrophils Relative %: 47.7 % (ref 43.0–77.0)
Platelets: 287 10*3/uL (ref 150.0–400.0)
RBC: 4.33 Mil/uL (ref 3.87–5.11)
RDW: 13.7 % (ref 11.5–15.5)
WBC: 3.7 10*3/uL — ABNORMAL LOW (ref 4.0–10.5)

## 2023-09-10 LAB — HEPATIC FUNCTION PANEL
ALT: 16 U/L (ref 0–35)
AST: 17 U/L (ref 0–37)
Albumin: 4.2 g/dL (ref 3.5–5.2)
Alkaline Phosphatase: 67 U/L (ref 39–117)
Bilirubin, Direct: 0.1 mg/dL (ref 0.0–0.3)
Total Bilirubin: 0.6 mg/dL (ref 0.2–1.2)
Total Protein: 7.3 g/dL (ref 6.0–8.3)

## 2023-09-10 LAB — BASIC METABOLIC PANEL WITH GFR
BUN: 12 mg/dL (ref 6–23)
CO2: 33 meq/L — ABNORMAL HIGH (ref 19–32)
Calcium: 9.5 mg/dL (ref 8.4–10.5)
Chloride: 101 meq/L (ref 96–112)
Creatinine, Ser: 0.66 mg/dL (ref 0.40–1.20)
GFR: 87.61 mL/min (ref >60.00)
Glucose, Bld: 89 mg/dL (ref 70–99)
Potassium: 3.9 meq/L (ref 3.5–5.1)
Sodium: 139 meq/L (ref 135–145)

## 2023-09-10 LAB — LIPID PANEL
Cholesterol: 308 mg/dL — ABNORMAL HIGH (ref 0–200)
HDL: 58.7 mg/dL (ref >39.00)
LDL Cholesterol: 231 mg/dL — ABNORMAL HIGH (ref 0–99)
NonHDL: 248.87
Total CHOL/HDL Ratio: 5
Triglycerides: 90 mg/dL (ref 0.0–149.0)
VLDL: 18 mg/dL (ref 0.0–40.0)

## 2023-09-10 LAB — TSH: TSH: 1.98 u[IU]/mL (ref 0.35–5.50)

## 2023-09-10 LAB — VITAMIN D 25 HYDROXY (VIT D DEFICIENCY, FRACTURES): VITD: 26.53 ng/mL — ABNORMAL LOW (ref 30.00–100.00)

## 2023-09-10 NOTE — Assessment & Plan Note (Signed)
 Chronic problem.  Currently on Atorvastatin  40mg  daily.  Asymptomatic.  Check labs.  Adjust meds prn

## 2023-09-10 NOTE — Assessment & Plan Note (Signed)
Chronic problem.  On Amlodipine 10mg daily w/ good control.  Currently asymptomatic.  No med changes at this time 

## 2023-09-10 NOTE — Assessment & Plan Note (Signed)
 Check labs and replete prn.

## 2023-09-10 NOTE — Patient Instructions (Signed)
 Schedule your complete physical in 6 months We'll notify you of your lab results and make any changes if needed Call with any questions or concerns Stay Safe!  Stay Healthy! Hang in there!  PRAYERS!!!

## 2023-09-10 NOTE — Progress Notes (Signed)
   Subjective:    Patient ID: Janet Pierce, female    DOB: November 29, 1950, 73 y.o.   MRN: [id number]  HPI HTN- chronic problem, on Amlodipine  10mg  daily.  Denies CP, SOB, HA's, visual changes, edema.  Hyperlipidemia- chronic problem, on Atorvastatin  40mg  daily.  Denies abd pain, N/V.  Vit D Deficiency- due for repeat labs.   Review of Systems For ROS see HPI     Objective:   Physical Exam Vitals reviewed.  Constitutional:      General: She is not in acute distress.    Appearance: Normal appearance. She is well-developed. She is not ill-appearing.  HENT:     Head: Normocephalic and atraumatic.  Eyes:     Conjunctiva/sclera: Conjunctivae normal.     Pupils: Pupils are equal, round, and reactive to light.  Neck:     Thyroid : No thyromegaly.  Cardiovascular:     Rate and Rhythm: Normal rate and regular rhythm.     Pulses: Normal pulses.     Heart sounds: Normal heart sounds. No murmur heard. Pulmonary:     Effort: Pulmonary effort is normal. No respiratory distress.     Breath sounds: Normal breath sounds.  Abdominal:     General: There is no distension.     Palpations: Abdomen is soft.     Tenderness: There is no abdominal tenderness.  Musculoskeletal:     Cervical back: Normal range of motion and neck supple.     Right lower leg: No edema.     Left lower leg: No edema.  Lymphadenopathy:     Cervical: No cervical adenopathy.  Skin:    General: Skin is warm and dry.  Neurological:     General: No focal deficit present.     Mental Status: She is alert and oriented to person, place, and time.  Psychiatric:        Mood and Affect: Mood normal.        Behavior: Behavior normal.           Assessment & Plan:

## 2023-09-10 NOTE — Telephone Encounter (Signed)
 See provider note 09/09/2023

## 2023-09-11 ENCOUNTER — Ambulatory Visit: Payer: Self-pay | Admitting: Family Medicine

## 2023-09-15 ENCOUNTER — Other Ambulatory Visit: Payer: Self-pay

## 2023-09-15 DIAGNOSIS — R7989 Other specified abnormal findings of blood chemistry: Secondary | ICD-10-CM

## 2023-09-15 MED ORDER — VITAMIN D (ERGOCALCIFEROL) 1.25 MG (50000 UNIT) PO CAPS: 50000.0000 [IU] | ORAL_CAPSULE | ORAL | 0 refills | Status: DC

## 2023-09-16 ENCOUNTER — Encounter (HOSPITAL_COMMUNITY)
Admission: RE | Admit: 2023-09-16 | Discharge: 2023-09-16 | Disposition: A | Discharge status: Home / Self Care | Source: Ambulatory Visit | Attending: Thoracic Surgery (Cardiothoracic Vascular Surgery) | Admitting: Thoracic Surgery (Cardiothoracic Vascular Surgery)

## 2023-09-16 DIAGNOSIS — R911 Solitary pulmonary nodule: Secondary | ICD-10-CM | POA: Insufficient documentation

## 2023-09-16 LAB — GLUCOSE, CAPILLARY: Glucose-Capillary: 93 mg/dL (ref 70–99)

## 2023-09-16 MED ORDER — FLUDEOXYGLUCOSE F - 18 (FDG) INJECTION
6.6900 | Freq: Once | INTRAVENOUS | Status: AC
  Administered 2023-09-16: 6.69 via INTRAVENOUS

## 2023-09-18 ENCOUNTER — Ambulatory Visit
Attending: Thoracic Surgery (Cardiothoracic Vascular Surgery) | Admitting: Thoracic Surgery (Cardiothoracic Vascular Surgery)

## 2023-09-18 ENCOUNTER — Other Ambulatory Visit: Payer: Self-pay | Admitting: Thoracic Surgery (Cardiothoracic Vascular Surgery)

## 2023-09-18 DIAGNOSIS — R911 Solitary pulmonary nodule: Secondary | ICD-10-CM

## 2023-09-18 NOTE — Progress Notes (Signed)
     [address]       [place] [address]             [phone]       Patient: Home Provider: Office Consent for Telemedicine visit obtained.  Today's visit was completed via a real-time telehealth (see specific modality noted below). The patient/authorized person provided oral consent at the time of the visit to engage in a telemedicine encounter with the present provider at [facility]. The patient/authorized person was informed of the potential benefits, limitations, and risks of telemedicine. The patient/authorized person expressed understanding that the laws that protect confidentiality also apply to telemedicine. The patient/authorized person acknowledged understanding that telemedicine does not provide emergency services and that he or she would need to call 911 or proceed to the nearest hospital for help if such a need arose.   Total time spent in the clinical discussion 10 minutes.  Telehealth Modality: Phone visit (audio only)  I had a telephone visit with Migdalia Alberta We discussed the results of her PFTs and PET/CT.  She will require a stress test.  She has agreed to Ion marking followed by R RATS, RML wedge, possible lobe.  Nona Gracey Ala Alice

## 2023-09-21 ENCOUNTER — Other Ambulatory Visit: Payer: Self-pay | Admitting: Thoracic Surgery (Cardiothoracic Vascular Surgery)

## 2023-09-21 ENCOUNTER — Encounter (HOSPITAL_COMMUNITY): Payer: Self-pay

## 2023-09-21 DIAGNOSIS — R911 Solitary pulmonary nodule: Secondary | ICD-10-CM

## 2023-09-22 ENCOUNTER — Encounter: Payer: Self-pay | Admitting: *Deleted

## 2023-09-22 ENCOUNTER — Encounter (HOSPITAL_COMMUNITY): Payer: Self-pay | Admitting: Thoracic Surgery (Cardiothoracic Vascular Surgery)

## 2023-09-22 ENCOUNTER — Other Ambulatory Visit: Payer: Self-pay | Admitting: *Deleted

## 2023-09-22 DIAGNOSIS — R911 Solitary pulmonary nodule: Secondary | ICD-10-CM

## 2023-09-24 ENCOUNTER — Other Ambulatory Visit: Payer: Self-pay | Admitting: Thoracic Surgery (Cardiothoracic Vascular Surgery)

## 2023-09-24 ENCOUNTER — Telehealth (HOSPITAL_COMMUNITY): Payer: Self-pay | Admitting: *Deleted

## 2023-09-24 DIAGNOSIS — R911 Solitary pulmonary nodule: Secondary | ICD-10-CM

## 2023-09-24 NOTE — Telephone Encounter (Signed)
 Left message on voicemail per DPR in reference to upcoming appointment scheduled on 09/28/2023 at 10:30 with detailed instructions given per Myocardial Perfusion Study Information Sheet for the test. LM to arrive 15 minutes early, and that it is imperative to arrive on time for appointment to keep from having the test rescheduled. If you need to cancel or reschedule your appointment, please call the office within 24 hours of your appointment. Failure to do so may result in a cancellation of your appointment, and a $50 no show fee. Phone number given for call back for any questions.

## 2023-09-28 ENCOUNTER — Ambulatory Visit (HOSPITAL_COMMUNITY)
Admission: RE | Admit: 2023-09-28 | Discharge: 2023-09-28 | Disposition: A | Discharge status: Home / Self Care | Source: Ambulatory Visit | Attending: Cardiovascular Disease | Admitting: Cardiovascular Disease

## 2023-09-28 DIAGNOSIS — Z01810 Encounter for preprocedural cardiovascular examination: Secondary | ICD-10-CM | POA: Diagnosis not present

## 2023-09-28 DIAGNOSIS — R911 Solitary pulmonary nodule: Secondary | ICD-10-CM

## 2023-09-28 DIAGNOSIS — I2510 Atherosclerotic heart disease of native coronary artery without angina pectoris: Secondary | ICD-10-CM | POA: Diagnosis not present

## 2023-09-28 DIAGNOSIS — I700 Atherosclerosis of aorta: Secondary | ICD-10-CM | POA: Insufficient documentation

## 2023-09-28 LAB — MYOCARDIAL PERFUSION IMAGING
LV dias vol: 75 mL (ref 46–106)
LV sys vol: 22 mL
Nuc Stress EF: 71 %
Peak HR: 93 {beats}/min
Rest HR: 74 {beats}/min
Rest Nuclear Isotope Dose: 9.7 mCi
SDS: 0
SRS: 4
SSS: 2
ST Depression (mm): 0 mm
Stress Nuclear Isotope Dose: 32.5 mCi
TID: 1.16

## 2023-09-28 MED ORDER — TECHNETIUM TC 99M TETROFOSMIN IV KIT
9.7000 | PACK | Freq: Once | INTRAVENOUS | Status: AC | PRN
  Administered 2023-09-28: 9.7 via INTRAVENOUS

## 2023-09-28 MED ORDER — REGADENOSON 0.4 MG/5ML IV SOLN
0.4000 mg | Freq: Once | INTRAVENOUS | Status: AC
  Administered 2023-09-28: 0.4 mg via INTRAVENOUS

## 2023-09-28 MED ORDER — REGADENOSON 0.4 MG/5ML IV SOLN
INTRAVENOUS | Status: AC
  Filled 2023-09-28: qty 5

## 2023-09-28 MED ORDER — TECHNETIUM TC 99M TETROFOSMIN IV KIT
32.5000 | PACK | Freq: Once | INTRAVENOUS | Status: AC | PRN
  Administered 2023-09-28: 32.5 via INTRAVENOUS

## 2023-10-07 NOTE — Progress Notes (Signed)
 Surgical Instructions   Your procedure is scheduled on Monday October 12, 2023. Report to [facility] Main Entrance A at 9:00 A.M., then check in with the Admitting office. Any questions or running late day of surgery: call [phone]  Questions prior to your surgery date: call [phone], Monday-Friday, 8am-4pm. If you experience any cold or flu symptoms such as cough, fever, chills, shortness of breath, etc. between now and your scheduled surgery, please notify us  at the above number.     Remember:  Do not eat or drink after midnight the night before your surgery  Take these medicines the morning of surgery with A SIP OF WATER  amLODipine  (NORVASC )  atorvastatin  (LIPITOR)  brimonidine (ALPHAGAN P) 0.1 % SOLN to right eye as directed dorzolamide (TRUSOPT) 2 % ophthalmic solution  pantoprazole  (PROTONIX )  timolol  (TIMOPTIC ) 0.5 % ophthalmic solution  XDEMVY 0.25 % SOLN   May take these medicines IF NEEDED: tiZANidine  (ZANAFLEX )   One week prior to surgery, STOP taking any Aspirin  (unless otherwise instructed by your surgeon) Aleve , Naproxen , Ibuprofen, Motrin, Advil, Goody's, BC's, all herbal medications, fish oil, and non-prescription vitamins.                     Do NOT Smoke (Tobacco/Vaping) for 24 hours prior to your procedure.  If you use a CPAP at night, you may bring your mask/headgear for your overnight stay.   You will be asked to remove any contacts, glasses, piercing's, hearing aid's, dentures/partials prior to surgery. Please bring cases for these items if needed.    Patients discharged the day of surgery will not be allowed to drive home, and someone needs to stay with them for 24 hours.  SURGICAL WAITING ROOM VISITATION Patients may have no more than 2 support people in the waiting area - these visitors may rotate.   Pre-op nurse will coordinate an appropriate time for 1 ADULT support person, who may not rotate, to accompany patient in pre-op.  Children under  the age of 45 must have an adult with them who is not the patient and must remain in the main waiting area with an adult.  If the patient needs to stay at the hospital during part of their recovery, the visitor guidelines for inpatient rooms apply.  Please refer to the [facility] website for the visitor guidelines for any additional information.   If you received a COVID test during your pre-op visit  it is requested that you wear a mask when out in public, stay away from anyone that may not be feeling well and notify your surgeon if you develop symptoms. If you have been in contact with anyone that has tested positive in the last 10 days please notify you surgeon.      Pre-operative CHG Bathing Instructions   You can play a key role in reducing the risk of infection after surgery. Your skin needs to be as free of germs as possible. You can reduce the number of germs on your skin by washing with CHG (chlorhexidine gluconate) soap before surgery. CHG is an antiseptic soap that kills germs and continues to kill germs even after washing.   DO NOT use if you have an allergy to chlorhexidine/CHG or antibacterial soaps. If your skin becomes reddened or irritated, stop using the CHG and notify one of our RNs at [phone].              TAKE A SHOWER THE NIGHT BEFORE SURGERY AND THE DAY OF  SURGERY    Please keep in mind the following:  DO NOT shave, including legs and underarms, 48 hours prior to surgery.   You may shave your face before/day of surgery.  Place clean sheets on your bed the night before surgery Use a clean washcloth (not used since being washed) for each shower. DO NOT sleep with pet's night before surgery.  CHG Shower Instructions:  Wash your face and private area with normal soap. If you choose to wash your hair, wash first with your normal shampoo.  After you use shampoo/soap, rinse your hair and body thoroughly to remove shampoo/soap residue.  Turn the water OFF and apply  half the bottle of CHG soap to a CLEAN washcloth.  Apply CHG soap ONLY FROM YOUR NECK DOWN TO YOUR TOES (washing for 3-5 minutes)  DO NOT use CHG soap on face, private areas, open wounds, or sores.  Pay special attention to the area where your surgery is being performed.  If you are having back surgery, having someone wash your back for you may be helpful. Wait 2 minutes after CHG soap is applied, then you may rinse off the CHG soap.  Pat dry with a clean towel  Put on clean pajamas    Additional instructions for the day of surgery: DO NOT APPLY any lotions, deodorants, cologne, or perfumes.   Do not wear jewelry or makeup Do not wear nail polish, gel polish, artificial nails, or any other type of covering on natural nails (fingers and toes) Do not bring valuables to the hospital. [facility] is not responsible for valuables/personal belongings. Put on clean/comfortable clothes.  Please brush your teeth.  Ask your nurse before applying any prescription medications to the skin.

## 2023-10-08 ENCOUNTER — Other Ambulatory Visit: Payer: Self-pay

## 2023-10-08 ENCOUNTER — Encounter (HOSPITAL_COMMUNITY): Payer: Self-pay

## 2023-10-08 ENCOUNTER — Ambulatory Visit (HOSPITAL_COMMUNITY)
Admission: RE | Admit: 2023-10-08 | Discharge: 2023-10-08 | Disposition: A | Discharge status: Home / Self Care | Source: Ambulatory Visit | Attending: Thoracic Surgery (Cardiothoracic Vascular Surgery) | Admitting: Thoracic Surgery (Cardiothoracic Vascular Surgery)

## 2023-10-08 ENCOUNTER — Encounter (HOSPITAL_COMMUNITY)
Admission: RE | Admit: 2023-10-08 | Discharge: 2023-10-08 | Disposition: A | Discharge status: Home / Self Care | Source: Ambulatory Visit | Attending: Thoracic Surgery (Cardiothoracic Vascular Surgery) | Admitting: Thoracic Surgery (Cardiothoracic Vascular Surgery)

## 2023-10-08 VITALS — BP 120/76 | HR 79 | Temp 98.3°F | Resp 16 | Ht 63.5 in | Wt 137.6 lb

## 2023-10-08 DIAGNOSIS — Z87891 Personal history of nicotine dependence: Secondary | ICD-10-CM | POA: Insufficient documentation

## 2023-10-08 DIAGNOSIS — K219 Gastro-esophageal reflux disease without esophagitis: Secondary | ICD-10-CM | POA: Diagnosis not present

## 2023-10-08 DIAGNOSIS — R942 Abnormal results of pulmonary function studies: Secondary | ICD-10-CM | POA: Insufficient documentation

## 2023-10-08 DIAGNOSIS — Z79899 Other long term (current) drug therapy: Secondary | ICD-10-CM | POA: Diagnosis not present

## 2023-10-08 DIAGNOSIS — Z01818 Encounter for other preprocedural examination: Secondary | ICD-10-CM | POA: Diagnosis present

## 2023-10-08 DIAGNOSIS — R911 Solitary pulmonary nodule: Secondary | ICD-10-CM | POA: Insufficient documentation

## 2023-10-08 DIAGNOSIS — I10 Essential (primary) hypertension: Secondary | ICD-10-CM | POA: Diagnosis not present

## 2023-10-08 DIAGNOSIS — E785 Hyperlipidemia, unspecified: Secondary | ICD-10-CM | POA: Insufficient documentation

## 2023-10-08 LAB — PROTIME-INR
INR: 1.1 (ref 0.8–1.2)
Prothrombin Time: 14.4 s (ref 11.4–15.2)

## 2023-10-08 LAB — COMPREHENSIVE METABOLIC PANEL WITH GFR
ALT: 27 U/L (ref 0–44)
AST: 23 U/L (ref 15–41)
Albumin: 3.6 g/dL (ref 3.5–5.0)
Alkaline Phosphatase: 62 U/L (ref 38–126)
Anion gap: 11 (ref 5–15)
BUN: 9 mg/dL (ref 8–23)
CO2: 26 mmol/L (ref 22–32)
Calcium: 9.3 mg/dL (ref 8.9–10.3)
Chloride: 102 mmol/L (ref 98–111)
Creatinine, Ser: 0.57 mg/dL (ref 0.44–1.00)
GFR, Estimated: >60 mL/min (ref >60)
Glucose, Bld: 87 mg/dL (ref 70–99)
Potassium: 3.8 mmol/L (ref 3.5–5.1)
Sodium: 139 mmol/L (ref 135–145)
Total Bilirubin: 0.8 mg/dL (ref 0.0–1.2)
Total Protein: 7.1 g/dL (ref 6.5–8.1)

## 2023-10-08 LAB — URINALYSIS, ROUTINE W REFLEX MICROSCOPIC
Bilirubin Urine: NEGATIVE
Glucose, UA: NEGATIVE mg/dL
Ketones, ur: NEGATIVE mg/dL
Leukocytes,Ua: NEGATIVE
Nitrite: NEGATIVE
Protein, ur: NEGATIVE mg/dL
Specific Gravity, Urine: 1.02 (ref 1.005–1.030)
pH: 6 (ref 5.0–8.0)

## 2023-10-08 LAB — SURGICAL PCR SCREEN
MRSA, PCR: NEGATIVE
Staphylococcus aureus: NEGATIVE

## 2023-10-08 LAB — CBC
HCT: 36.5 % (ref 36.0–46.0)
Hemoglobin: 11.8 g/dL — ABNORMAL LOW (ref 12.0–15.0)
MCH: 27.1 pg (ref 26.0–34.0)
MCHC: 32.3 g/dL (ref 30.0–36.0)
MCV: 83.9 fL (ref 80.0–100.0)
Platelets: 287 10*3/uL (ref 150–400)
RBC: 4.35 MIL/uL (ref 3.87–5.11)
RDW: 12.5 % (ref 11.5–15.5)
WBC: 4.1 10*3/uL (ref 4.0–10.5)
nRBC: 0 % (ref 0.0–0.2)

## 2023-10-08 LAB — TYPE AND SCREEN
ABO/RH(D): O POS
Antibody Screen: NEGATIVE

## 2023-10-08 LAB — APTT: aPTT: 31 s (ref 24–36)

## 2023-10-08 NOTE — Progress Notes (Signed)
 PCP - Ferna How Cardiologist - denies  PPM/ICD - denies Device Orders -  Rep Notified -   Chest x-ray - 10/08/23 EKG - 10/08/23 Stress Test - 09/28/23 ECHO - 01/21/23 Cardiac Cath - denies  Sleep Study - denies CPAP -   Fasting Blood Sugar - na Checks Blood Sugar _____ times a day  Last dose of GLP1 agonist-  na GLP1 instructions:   Blood Thinner Instructions:na Aspirin  Instructions:na  ERAS Protcol -no PRE-SURGERY Ensure or G2-   COVID TEST- na   Anesthesia review: yes-HTN,HLD,wedge resection  Patient denies shortness of breath, fever, cough and chest pain at PAT appointment   All instructions explained to the patient, with a verbal understanding of the material. Patient agrees to go over the instructions while at home for a better understanding.  The opportunity to ask questions was provided.

## 2023-10-08 NOTE — Progress Notes (Addendum)
 Surgical Instructions   Your procedure is scheduled on Monday October 12, 2023. Report to [facility] Main Entrance A at 9:00 A.M., then check in with the Admitting office. Any questions or running late day of surgery: call [phone]  Questions prior to your surgery date: call [phone], Monday-Friday, 8am-4pm. If you experience any cold or flu symptoms such as cough, fever, chills, shortness of breath, etc. between now and your scheduled surgery, please notify us  at the above number.     Remember:  Do not eat or drink after midnight the night before your surgery  Take these medicines the morning of surgery with A SIP OF WATER  amLODipine  (NORVASC )  atorvastatin  (LIPITOR)  brimonidine (ALPHAGAN P) 0.1 % SOLN to right eye as directed dorzolamide-timolol  (COSOPT) 2-0.5 %  LUMIGAN 0.01 % SOLN   May take these medicines IF NEEDED: tiZANidine  (ZANAFLEX )  pantoprazole  (PROTONIX )  Polyethyl Glycol-Propyl Glycol (SYSTANE FREE OP)   One week prior to surgery, STOP taking any Aspirin  (unless otherwise instructed by your surgeon) Aleve , Naproxen , Ibuprofen, Motrin, Advil, Goody's, BC's, all herbal medications, fish oil, and non-prescription vitamins.                     Do NOT Smoke (Tobacco/Vaping) for 24 hours prior to your procedure.  If you use a CPAP at night, you may bring your mask/headgear for your overnight stay.   You will be asked to remove any contacts, glasses, piercing's, hearing aid's, dentures/partials prior to surgery. Please bring cases for these items if needed.    Patients discharged the day of surgery will not be allowed to drive home, and someone needs to stay with them for 24 hours.  SURGICAL WAITING ROOM VISITATION Patients may have no more than 2 support people in the waiting area - these visitors may rotate.   Pre-op nurse will coordinate an appropriate time for 1 ADULT support person, who may not rotate, to accompany patient in pre-op.  Children under the  age of 42 must have an adult with them who is not the patient and must remain in the main waiting area with an adult.  If the patient needs to stay at the hospital during part of their recovery, the visitor guidelines for inpatient rooms apply.  Please refer to the [facility] website for the visitor guidelines for any additional information.   If you received a COVID test during your pre-op visit  it is requested that you wear a mask when out in public, stay away from anyone that may not be feeling well and notify your surgeon if you develop symptoms. If you have been in contact with anyone that has tested positive in the last 10 days please notify you surgeon.      Pre-operative CHG Bathing Instructions   You can play a key role in reducing the risk of infection after surgery. Your skin needs to be as free of germs as possible. You can reduce the number of germs on your skin by washing with CHG (chlorhexidine gluconate) soap before surgery. CHG is an antiseptic soap that kills germs and continues to kill germs even after washing.   DO NOT use if you have an allergy to chlorhexidine/CHG or antibacterial soaps. If your skin becomes reddened or irritated, stop using the CHG and notify one of our RNs at [phone].              TAKE A SHOWER THE NIGHT BEFORE SURGERY AND THE DAY OF SURGERY  Please keep in mind the following:  DO NOT shave, including legs and underarms, 48 hours prior to surgery.   You may shave your face before/day of surgery.  Place clean sheets on your bed the night before surgery Use a clean washcloth (not used since being washed) for each shower. DO NOT sleep with pet's night before surgery.  CHG Shower Instructions:  Wash your face and private area with normal soap. If you choose to wash your hair, wash first with your normal shampoo.  After you use shampoo/soap, rinse your hair and body thoroughly to remove shampoo/soap residue.  Turn the water OFF and apply half  the bottle of CHG soap to a CLEAN washcloth.  Apply CHG soap ONLY FROM YOUR NECK DOWN TO YOUR TOES (washing for 3-5 minutes)  DO NOT use CHG soap on face, private areas, open wounds, or sores.  Pay special attention to the area where your surgery is being performed.  If you are having back surgery, having someone wash your back for you may be helpful. Wait 2 minutes after CHG soap is applied, then you may rinse off the CHG soap.  Pat dry with a clean towel  Put on clean pajamas    Additional instructions for the day of surgery: DO NOT APPLY any lotions, deodorants, cologne, or perfumes.   Do not wear jewelry or makeup Do not wear nail polish, gel polish, artificial nails, or any other type of covering on natural nails (fingers and toes) Do not bring valuables to the hospital. [facility] is not responsible for valuables/personal belongings. Put on clean/comfortable clothes.  Please brush your teeth.  Ask your nurse before applying any prescription medications to the skin.

## 2023-10-09 NOTE — Anesthesia Preprocedure Evaluation (Signed)
 Anesthesia Evaluation  Patient identified by MRN, date of birth, ID band Patient awake    Reviewed: Allergy & Precautions, H&P , NPO status , Patient's Chart, lab work & pertinent test results, reviewed documented beta blocker date and time   History of Anesthesia Complications Negative for: history of anesthetic complications  Airway Mallampati: III   Neck ROM: full    Dental   Pulmonary neg COPD, Patient abstained from smoking., former smoker, neg PE   breath sounds clear to auscultation       Cardiovascular hypertension, (-) CAD, (-) Past MI and (-) Cardiac Stents  Rhythm:regular Rate:Normal   1. Left ventricular ejection fraction, by estimation, is 60 to 65%. Left  ventricular ejection fraction by 3D volume is 62 %. The left ventricle has  normal function. The left ventricle has no regional wall motion  abnormalities. There is mild left  ventricular hypertrophy. Left ventricular diastolic parameters were  normal. The average left ventricular global longitudinal strain is -17.4  %. The global longitudinal strain is normal.   2. Right ventricular systolic function is normal. The right ventricular  size is normal. Mildly increased right ventricular wall thickness. There  is normal pulmonary artery systolic pressure.   3. The mitral valve is grossly normal. Mild mitral valve regurgitation.  No evidence of mitral stenosis.   4. The aortic valve is tricuspid. Aortic valve regurgitation is mild. No  aortic stenosis is present.   5. The inferior vena cava is normal in size with greater than 50%  respiratory variability, suggesting right atrial pressure of 3 mmHg.     Neuro/Psych neg Seizures PSYCHIATRIC DISORDERS Anxiety Depression    Pulmonary nodule    GI/Hepatic ,GERD  ,,(+) neg Cirrhosis        Endo/Other    Renal/GU Renal disease     Musculoskeletal  (+) Arthritis ,    Abdominal   Peds  Hematology  (+) Blood  dyscrasia, anemia   Anesthesia Other Findings   Reproductive/Obstetrics                              Anesthesia Physical Anesthesia Plan  ASA: 3  Anesthesia Plan: General   Post-op Pain Management:    Induction: Intravenous  PONV Risk Score and Plan: 3 and Ondansetron , Dexamethasone and Treatment may vary due to age or medical condition  Airway Management Planned: Oral ETT  Additional Equipment:   Intra-op Plan:   Post-operative Plan: Extubation in OR  Informed Consent: I have reviewed the patients History and Physical, chart, labs and discussed the procedure including the risks, benefits and alternatives for the proposed anesthesia with the patient or authorized representative who has indicated his/her understanding and acceptance.     Dental advisory given  Plan Discussed with: CRNA, Anesthesiologist and Surgeon  Anesthesia Plan Comments: (PAT note by Lynwood Hope, PA-C: 73 year old female with pertinent history including former smoker, GERD on PPI, anxiety, HTN, HLD, left eye prosthesis.  Echo 01/2023 showed EF 60 to 65%, normal RV function, mild mitral regurgitation, mild aortic regurgitation.  Nuclear stress 09/2023 was low risk.  PFTs 09/04/2023 showed, Conclusions: The diffusion defect, increased FEV1/ FVC ratio and reduced FVC suggest an early interstitial process. Anemia cannot be excluded as a potential cause of the diffusion defect without correcting the observed diffusing capacity for hemoglobin. In view of the severity of the diffusion defect, studies with exercise would be helpful to evaluate the presence of hypoxemia. Pulmonary  Function Diagnosis: Moderate Obstruction Insignificant response to bronchodilator Airtrapping without overinflation Moderate Diffusion Defect.  Preop labs reviewed, mild anemia hemoglobin 11.8, otherwise WNL.  EKG 10/08/2023: NSR.  Rate 71.  PFTs  09/04/2023: FVC-%Pred-Pre % 60 FEV1-%Pred-Pre % 67 FEV1FVC-%Pred-Pre % 111 TLC % pred % 109 RV % pred % 173 DLCO unc % pred % 60   Nuclear stress 09/28/2023:   The study is normal. The study is low risk.   No ST deviation was noted.   LV perfusion is normal. There is no evidence of ischemia. There is no evidence of infarction.   Left ventricular function is normal. Nuclear stress EF: 71%. The left ventricular ejection fraction is hyperdynamic (>65%). End diastolic cavity size is normal. End systolic cavity size is normal.   CT images were obtained for attenuation correction and were examined for the presence of coronary calcium  when appropriate.   Coronary calcium  was present on the attenuation correction CT images. Moderate coronary calcifications were present. Coronary calcifications were present in the left anterior descending artery and left circumflex artery distribution(s).   Prior study not available for comparison.   1. Fixed apical perfusion defect with normal wall motion consistent with artifact 2. LVEF 71% 3. Low risk study  TTE 01/21/23: 1. Left ventricular ejection fraction, by estimation, is 60 to 65%. Left  ventricular ejection fraction by 3D volume is 62 %. The left ventricle has  normal function. The left ventricle has no regional wall motion  abnormalities. There is mild left  ventricular hypertrophy. Left ventricular diastolic parameters were  normal. The average left ventricular global longitudinal strain is -17.4  %. The global longitudinal strain is normal.   2. Right ventricular systolic function is normal. The right ventricular  size is normal. Mildly increased right ventricular wall thickness. There  is normal pulmonary artery systolic pressure.   3. The mitral valve is grossly normal. Mild mitral valve regurgitation.  No evidence of mitral stenosis.   4. The aortic valve is tricuspid. Aortic valve regurgitation is mild. No  aortic stenosis is present.    5. The inferior vena cava is normal in size with greater than 50%  respiratory variability, suggesting right atrial pressure of 3 mmHg.   Comparison(s): Prior 09/11/2015: LVEF 60-65%, grade 1 diastolic  dysfunction, mild to moderate aortic regurgitation, see report for  additional details.    )        Anesthesia Quick Evaluation

## 2023-10-09 NOTE — Progress Notes (Signed)
 Anesthesia Chart Review:  73 year old female with pertinent history including former smoker, GERD on PPI, anxiety, HTN, HLD, left eye prosthesis.  Echo 01/2023 showed EF 60 to 65%, normal RV function, mild mitral regurgitation, mild aortic regurgitation.  Nuclear stress 09/2023 was low risk.  PFTs 09/04/2023 showed, Conclusions: The diffusion defect, increased FEV1/ FVC ratio and reduced FVC suggest an early interstitial process. Anemia cannot be excluded as a potential cause of the diffusion defect without correcting the observed diffusing capacity for hemoglobin. In view of the severity of the diffusion defect, studies with exercise would be helpful to evaluate the presence of hypoxemia. Pulmonary Function Diagnosis: Moderate Obstruction Insignificant response to bronchodilator Airtrapping without overinflation Moderate Diffusion Defect.  Preop labs reviewed, mild anemia hemoglobin 11.8, otherwise WNL.  EKG 10/08/2023: NSR.  Rate 71.  PFTs 09/04/2023: FVC-%Pred-Pre % 60  FEV1-%Pred-Pre % 67  FEV1FVC-%Pred-Pre % 111  TLC % pred % 109  RV % pred % 173  DLCO unc % pred % 60    Nuclear stress 09/28/2023:   The study is normal. The study is low risk.   No ST deviation was noted.   LV perfusion is normal. There is no evidence of ischemia. There is no evidence of infarction.   Left ventricular function is normal. Nuclear stress EF: 71%. The left ventricular ejection fraction is hyperdynamic (>65%). End diastolic cavity size is normal. End systolic cavity size is normal.   CT images were obtained for attenuation correction and were examined for the presence of coronary calcium  when appropriate.   Coronary calcium  was present on the attenuation correction CT images. Moderate coronary calcifications were present. Coronary calcifications were present in the left anterior descending artery and left circumflex artery distribution(s).   Prior study not available for comparison.   Fixed apical perfusion  defect with normal wall motion consistent with artifact LVEF 71% Low risk study  TTE 01/21/23: 1. Left ventricular ejection fraction, by estimation, is 60 to 65%. Left  ventricular ejection fraction by 3D volume is 62 %. The left ventricle has  normal function. The left ventricle has no regional wall motion  abnormalities. There is mild left  ventricular hypertrophy. Left ventricular diastolic parameters were  normal. The average left ventricular global longitudinal strain is -17.4  %. The global longitudinal strain is normal.   2. Right ventricular systolic function is normal. The right ventricular  size is normal. Mildly increased right ventricular wall thickness. There  is normal pulmonary artery systolic pressure.   3. The mitral valve is grossly normal. Mild mitral valve regurgitation.  No evidence of mitral stenosis.   4. The aortic valve is tricuspid. Aortic valve regurgitation is mild. No  aortic stenosis is present.   5. The inferior vena cava is normal in size with greater than 50%  respiratory variability, suggesting right atrial pressure of 3 mmHg.   Comparison(s): Prior 09/11/2015: LVEF 60-65%, grade 1 diastolic  dysfunction, mild to moderate aortic regurgitation, see report for  additional details.      Edilia Gordon [facility] Stay [facility]/Anesthesiology Phone [phone] 10/09/2023 10:39 AM

## 2023-10-12 ENCOUNTER — Inpatient Hospital Stay (HOSPITAL_COMMUNITY)

## 2023-10-12 ENCOUNTER — Inpatient Hospital Stay (HOSPITAL_COMMUNITY)
Admission: RE | Admit: 2023-10-12 | Discharge: 2023-10-13 | DRG: 165 | Disposition: A | Discharge status: Home / Self Care | Attending: Thoracic Surgery (Cardiothoracic Vascular Surgery) | Admitting: Thoracic Surgery (Cardiothoracic Vascular Surgery)

## 2023-10-12 ENCOUNTER — Other Ambulatory Visit: Payer: Self-pay

## 2023-10-12 ENCOUNTER — Ambulatory Visit (HOSPITAL_COMMUNITY)

## 2023-10-12 ENCOUNTER — Ambulatory Visit (HOSPITAL_COMMUNITY): Payer: Self-pay | Admitting: Physician Assistant

## 2023-10-12 ENCOUNTER — Encounter (HOSPITAL_COMMUNITY)
Admission: RE | Disposition: A | Discharge status: Home / Self Care | Payer: Self-pay | Source: Home / Self Care | Attending: Thoracic Surgery (Cardiothoracic Vascular Surgery)

## 2023-10-12 ENCOUNTER — Encounter (HOSPITAL_COMMUNITY): Payer: Self-pay | Admitting: Thoracic Surgery (Cardiothoracic Vascular Surgery)

## 2023-10-12 DIAGNOSIS — K219 Gastro-esophageal reflux disease without esophagitis: Secondary | ICD-10-CM | POA: Diagnosis present

## 2023-10-12 DIAGNOSIS — Z833 Family history of diabetes mellitus: Secondary | ICD-10-CM

## 2023-10-12 DIAGNOSIS — R911 Solitary pulmonary nodule: Secondary | ICD-10-CM

## 2023-10-12 DIAGNOSIS — Z79899 Other long term (current) drug therapy: Secondary | ICD-10-CM | POA: Diagnosis not present

## 2023-10-12 DIAGNOSIS — F418 Other specified anxiety disorders: Secondary | ICD-10-CM | POA: Diagnosis not present

## 2023-10-12 DIAGNOSIS — F17210 Nicotine dependence, cigarettes, uncomplicated: Secondary | ICD-10-CM | POA: Diagnosis present

## 2023-10-12 DIAGNOSIS — F419 Anxiety disorder, unspecified: Secondary | ICD-10-CM | POA: Diagnosis present

## 2023-10-12 DIAGNOSIS — C342 Malignant neoplasm of middle lobe, bronchus or lung: Secondary | ICD-10-CM | POA: Diagnosis present

## 2023-10-12 DIAGNOSIS — Z8249 Family history of ischemic heart disease and other diseases of the circulatory system: Secondary | ICD-10-CM | POA: Diagnosis not present

## 2023-10-12 DIAGNOSIS — E785 Hyperlipidemia, unspecified: Secondary | ICD-10-CM | POA: Diagnosis present

## 2023-10-12 DIAGNOSIS — Z800 Family history of malignant neoplasm of digestive organs: Secondary | ICD-10-CM

## 2023-10-12 DIAGNOSIS — I10 Essential (primary) hypertension: Secondary | ICD-10-CM

## 2023-10-12 DIAGNOSIS — Z902 Acquired absence of lung [part of]: Secondary | ICD-10-CM

## 2023-10-12 DIAGNOSIS — Z9889 Other specified postprocedural states: Secondary | ICD-10-CM

## 2023-10-12 DIAGNOSIS — Z801 Family history of malignant neoplasm of trachea, bronchus and lung: Secondary | ICD-10-CM | POA: Diagnosis not present

## 2023-10-12 DIAGNOSIS — M810 Age-related osteoporosis without current pathological fracture: Secondary | ICD-10-CM | POA: Diagnosis present

## 2023-10-12 DIAGNOSIS — F32A Depression, unspecified: Secondary | ICD-10-CM | POA: Diagnosis present

## 2023-10-12 DIAGNOSIS — Z01818 Encounter for other preprocedural examination: Secondary | ICD-10-CM

## 2023-10-12 HISTORY — PX: INTERCOSTAL NERVE BLOCK: SHX5021

## 2023-10-12 HISTORY — PX: VIDEO BRONCHOSCOPY WITH ENDOBRONCHIAL NAVIGATION: SHX6175

## 2023-10-12 HISTORY — PX: LOBECTOMY, LUNG, ROBOT-ASSISTED, USING VATS: SHX7607

## 2023-10-12 HISTORY — PX: LYMPH NODE BIOPSY: SHX201

## 2023-10-12 LAB — ABO/RH: ABO/RH(D): O POS

## 2023-10-12 SURGERY — LOBECTOMY, LUNG, ROBOT-ASSISTED, USING VATS
Anesthesia: General | Site: Chest | Laterality: Right

## 2023-10-12 SURGERY — VIDEO BRONCHOSCOPY WITH ENDOBRONCHIAL NAVIGATION
Anesthesia: General

## 2023-10-12 MED ORDER — DEXAMETHASONE SODIUM PHOSPHATE 10 MG/ML IJ SOLN
INTRAMUSCULAR | Status: DC | PRN
  Administered 2023-10-12 (×2): 5 mg via INTRAVENOUS

## 2023-10-12 MED ORDER — CEFAZOLIN SODIUM-DEXTROSE 2-4 GM/100ML-% IV SOLN
2.0000 g | Freq: Three times a day (TID) | INTRAVENOUS | Status: AC
  Administered 2023-10-12 – 2023-10-13 (×2): 2 g via INTRAVENOUS
  Filled 2023-10-12 (×2): qty 100

## 2023-10-12 MED ORDER — SENNOSIDES-DOCUSATE SODIUM 8.6-50 MG PO TABS
1.0000 | ORAL_TABLET | Freq: Every day | ORAL | Status: DC
  Administered 2023-10-12: 1 via ORAL
  Filled 2023-10-12: qty 1

## 2023-10-12 MED ORDER — ACETAMINOPHEN 160 MG/5ML PO SOLN
1000.0000 mg | Freq: Four times a day (QID) | ORAL | Status: DC
  Administered 2023-10-13: 1000 mg via ORAL
  Filled 2023-10-12: qty 40.6

## 2023-10-12 MED ORDER — SUGAMMADEX SODIUM 200 MG/2ML IV SOLN
INTRAVENOUS | Status: DC | PRN
  Administered 2023-10-12: 300 mg via INTRAVENOUS

## 2023-10-12 MED ORDER — CHLORHEXIDINE GLUCONATE 0.12 % MT SOLN
15.0000 mL | Freq: Once | OROMUCOSAL | Status: AC
  Administered 2023-10-12: 15 mL via OROMUCOSAL
  Filled 2023-10-12: qty 15

## 2023-10-12 MED ORDER — B-12 250 MCG PO TABS: 250.0000 ug | ORAL_TABLET | Freq: Every day | ORAL | Status: DC

## 2023-10-12 MED ORDER — SODIUM CHLORIDE (PF) 0.9 % IJ SOLN
INTRAMUSCULAR | Status: AC
  Filled 2023-10-12: qty 50

## 2023-10-12 MED ORDER — DORZOLAMIDE HCL-TIMOLOL MAL 2-0.5 % OP SOLN
1.0000 [drp] | Freq: Two times a day (BID) | OPHTHALMIC | Status: DC
  Administered 2023-10-12 – 2023-10-13 (×2): 1 [drp] via OPHTHALMIC
  Filled 2023-10-12: qty 10

## 2023-10-12 MED ORDER — LUNG SURGERY BOOK
Freq: Once | Status: AC
  Filled 2023-10-12: qty 1

## 2023-10-12 MED ORDER — BUPIVACAINE HCL (PF) 0.5 % IJ SOLN
INTRAMUSCULAR | Status: AC
  Filled 2023-10-12: qty 30

## 2023-10-12 MED ORDER — AMLODIPINE BESYLATE 10 MG PO TABS
10.0000 mg | ORAL_TABLET | Freq: Every day | ORAL | Status: DC
  Administered 2023-10-13: 10 mg via ORAL
  Filled 2023-10-12: qty 1

## 2023-10-12 MED ORDER — TRAMADOL HCL 50 MG PO TABS: 50.0000 mg | ORAL_TABLET | Freq: Four times a day (QID) | ORAL | Status: DC | PRN

## 2023-10-12 MED ORDER — FENTANYL CITRATE (PF) 100 MCG/2ML IJ SOLN
INTRAMUSCULAR | Status: AC
Start: 2023-10-12 — End: 2023-10-12
  Filled 2023-10-12: qty 2

## 2023-10-12 MED ORDER — ATORVASTATIN CALCIUM 40 MG PO TABS
40.0000 mg | ORAL_TABLET | Freq: Every day | ORAL | Status: DC
  Administered 2023-10-13: 40 mg via ORAL
  Filled 2023-10-12: qty 1

## 2023-10-12 MED ORDER — PROPOFOL 10 MG/ML IV BOLUS
INTRAVENOUS | Status: AC
  Filled 2023-10-12: qty 20

## 2023-10-12 MED ORDER — PROPOFOL 10 MG/ML IV BOLUS
INTRAVENOUS | Status: DC | PRN
  Administered 2023-10-12 (×2): 50 mg via INTRAVENOUS
  Administered 2023-10-12: 70 mg via INTRAVENOUS
  Administered 2023-10-12: 125 ug/kg/min via INTRAVENOUS

## 2023-10-12 MED ORDER — ONDANSETRON HCL 4 MG/2ML IJ SOLN: 4.0000 mg | Freq: Four times a day (QID) | INTRAMUSCULAR | Status: DC | PRN

## 2023-10-12 MED ORDER — ACETAMINOPHEN 500 MG PO TABS
1000.0000 mg | ORAL_TABLET | Freq: Four times a day (QID) | ORAL | Status: DC
  Administered 2023-10-13 (×2): 1000 mg via ORAL
  Filled 2023-10-12 (×2): qty 2

## 2023-10-12 MED ORDER — MIDAZOLAM HCL 2 MG/2ML IJ SOLN
INTRAMUSCULAR | Status: AC
  Filled 2023-10-12: qty 2

## 2023-10-12 MED ORDER — PHENYLEPHRINE HCL-NACL 20-0.9 MG/250ML-% IV SOLN
INTRAVENOUS | Status: DC | PRN
  Administered 2023-10-12: 30 ug/min via INTRAVENOUS

## 2023-10-12 MED ORDER — TIZANIDINE HCL 4 MG PO TABS: 4.0000 mg | ORAL_TABLET | Freq: Four times a day (QID) | ORAL | Status: DC | PRN

## 2023-10-12 MED ORDER — OXYCODONE HCL 5 MG PO TABS
5.0000 mg | ORAL_TABLET | ORAL | Status: DC | PRN
  Administered 2023-10-12: 5 mg via ORAL
  Filled 2023-10-12: qty 1

## 2023-10-12 MED ORDER — OXYCODONE HCL 5 MG PO TABS: 5.0000 mg | ORAL_TABLET | Freq: Once | ORAL | Status: DC | PRN

## 2023-10-12 MED ORDER — ORAL CARE MOUTH RINSE: 15.0000 mL | Freq: Once | OROMUCOSAL | Status: AC

## 2023-10-12 MED ORDER — FENTANYL CITRATE (PF) 250 MCG/5ML IJ SOLN
INTRAMUSCULAR | Status: DC | PRN
  Administered 2023-10-12: 50 ug via INTRAVENOUS
  Administered 2023-10-12 (×2): 100 ug via INTRAVENOUS

## 2023-10-12 MED ORDER — OXYCODONE HCL 5 MG/5ML PO SOLN: 5.0000 mg | Freq: Once | ORAL | Status: DC | PRN

## 2023-10-12 MED ORDER — LACTATED RINGERS IV SOLN: INTRAVENOUS | Status: DC | PRN

## 2023-10-12 MED ORDER — BRIMONIDINE TARTRATE 0.2 % OP SOLN
1.0000 [drp] | Freq: Two times a day (BID) | OPHTHALMIC | Status: DC
  Administered 2023-10-12 – 2023-10-13 (×2): 1 [drp] via OPHTHALMIC
  Filled 2023-10-12: qty 5

## 2023-10-12 MED ORDER — SUGAMMADEX SODIUM 200 MG/2ML IV SOLN: INTRAVENOUS | Status: DC | PRN

## 2023-10-12 MED ORDER — ESMOLOL HCL 100 MG/10ML IV SOLN
INTRAVENOUS | Status: DC | PRN
  Administered 2023-10-12: 30 mg via INTRAVENOUS
  Administered 2023-10-12: 20 mg via INTRAVENOUS

## 2023-10-12 MED ORDER — FENTANYL CITRATE (PF) 250 MCG/5ML IJ SOLN
INTRAMUSCULAR | Status: AC
  Filled 2023-10-12: qty 5

## 2023-10-12 MED ORDER — LATANOPROST 0.005 % OP SOLN
1.0000 [drp] | Freq: Every day | OPHTHALMIC | Status: DC
  Administered 2023-10-12: 1 [drp] via OPHTHALMIC
  Filled 2023-10-12: qty 2.5

## 2023-10-12 MED ORDER — LIDOCAINE 2% (20 MG/ML) 5 ML SYRINGE
INTRAMUSCULAR | Status: DC | PRN
  Administered 2023-10-12: 100 mg via INTRAVENOUS

## 2023-10-12 MED ORDER — INDOCYANINE GREEN 25 MG IV SOLR
INTRAVENOUS | Status: AC
  Filled 2023-10-12: qty 10

## 2023-10-12 MED ORDER — LACTATED RINGERS IV SOLN: INTRAVENOUS | Status: DC

## 2023-10-12 MED ORDER — KETOROLAC TROMETHAMINE 15 MG/ML IJ SOLN
INTRAMUSCULAR | Status: AC
  Filled 2023-10-12: qty 1

## 2023-10-12 MED ORDER — BUPIVACAINE LIPOSOME 1.3 % IJ SUSP
INTRAMUSCULAR | Status: AC
  Filled 2023-10-12: qty 20

## 2023-10-12 MED ORDER — BISACODYL 5 MG PO TBEC
10.0000 mg | DELAYED_RELEASE_TABLET | Freq: Every day | ORAL | Status: DC
  Administered 2023-10-13: 10 mg via ORAL
  Filled 2023-10-12: qty 2

## 2023-10-12 MED ORDER — 0.9 % SODIUM CHLORIDE (POUR BTL) OPTIME
TOPICAL | Status: DC | PRN
  Administered 2023-10-12: 2000 mL

## 2023-10-12 MED ORDER — MIDAZOLAM HCL 2 MG/2ML IJ SOLN
INTRAMUSCULAR | Status: DC | PRN
  Administered 2023-10-12 (×2): 1 mg via INTRAVENOUS

## 2023-10-12 MED ORDER — PANTOPRAZOLE SODIUM 40 MG PO TBEC
40.0000 mg | DELAYED_RELEASE_TABLET | Freq: Every day | ORAL | Status: DC
Start: 2023-10-13 — End: 2023-10-13
  Administered 2023-10-13: 40 mg via ORAL
  Filled 2023-10-12: qty 1

## 2023-10-12 MED ORDER — ONDANSETRON HCL 4 MG/2ML IJ SOLN
INTRAMUSCULAR | Status: DC | PRN
  Administered 2023-10-12: 4 mg via INTRAVENOUS

## 2023-10-12 MED ORDER — SODIUM CHLORIDE FLUSH 0.9 % IV SOLN
INTRAVENOUS | Status: DC | PRN
  Administered 2023-10-12: 100 mL

## 2023-10-12 MED ORDER — CEFAZOLIN SODIUM-DEXTROSE 2-4 GM/100ML-% IV SOLN
2.0000 g | INTRAVENOUS | Status: AC
  Administered 2023-10-12: 2 g via INTRAVENOUS
  Filled 2023-10-12: qty 100

## 2023-10-12 MED ORDER — ACETAMINOPHEN 10 MG/ML IV SOLN
INTRAVENOUS | Status: DC | PRN
Start: 2023-10-12 — End: 2023-10-12
  Administered 2023-10-12: 1000 mg via INTRAVENOUS

## 2023-10-12 MED ORDER — FENTANYL CITRATE (PF) 100 MCG/2ML IJ SOLN
25.0000 ug | INTRAMUSCULAR | Status: DC | PRN
  Administered 2023-10-12 (×2): 25 ug via INTRAVENOUS

## 2023-10-12 MED ORDER — GLYCOPYRROLATE 0.2 MG/ML IJ SOLN
INTRAMUSCULAR | Status: DC | PRN
Start: 2023-10-12 — End: 2023-10-12
  Administered 2023-10-12 (×2): .1 mg via INTRAVENOUS

## 2023-10-12 MED ORDER — ROCURONIUM BROMIDE 10 MG/ML (PF) SYRINGE
PREFILLED_SYRINGE | INTRAVENOUS | Status: DC | PRN
Start: 2023-10-12 — End: 2023-10-12
  Administered 2023-10-12: 20 mg via INTRAVENOUS
  Administered 2023-10-12: 50 mg via INTRAVENOUS
  Administered 2023-10-12: 100 mg via INTRAVENOUS

## 2023-10-12 MED ORDER — METHYLENE BLUE (ANTIDOTE) 1 % IV SOLN
INTRAVENOUS | Status: DC | PRN
  Administered 2023-10-12: .5 mL

## 2023-10-12 MED ORDER — CYANOCOBALAMIN 500 MCG PO TABS
250.0000 ug | ORAL_TABLET | Freq: Every day | ORAL | Status: DC
  Administered 2023-10-13: 250 ug via ORAL
  Filled 2023-10-12: qty 1

## 2023-10-12 MED ORDER — ENOXAPARIN SODIUM 40 MG/0.4ML IJ SOSY
40.0000 mg | PREFILLED_SYRINGE | Freq: Every day | INTRAMUSCULAR | Status: DC
  Administered 2023-10-13: 40 mg via SUBCUTANEOUS
  Filled 2023-10-12: qty 0.4

## 2023-10-12 MED ORDER — METHYLENE BLUE (ANTIDOTE) 1 % IV SOLN
INTRAVENOUS | Status: AC
  Filled 2023-10-12: qty 10

## 2023-10-12 MED ORDER — KETOROLAC TROMETHAMINE 15 MG/ML IJ SOLN
15.0000 mg | Freq: Four times a day (QID) | INTRAMUSCULAR | Status: DC
Start: 2023-10-12 — End: 2023-10-14
  Administered 2023-10-12 – 2023-10-13 (×4): 15 mg via INTRAVENOUS
  Filled 2023-10-12 (×3): qty 1

## 2023-10-12 MED ORDER — STERILE WATER FOR INJECTION IJ SOLN
INTRAMUSCULAR | Status: AC
  Filled 2023-10-12: qty 10

## 2023-10-12 MED ORDER — MORPHINE SULFATE (PF) 2 MG/ML IV SOLN
2.0000 mg | INTRAVENOUS | Status: DC | PRN
  Administered 2023-10-12: 2 mg via INTRAVENOUS
  Filled 2023-10-12: qty 1

## 2023-10-12 MED ORDER — ACETAMINOPHEN 10 MG/ML IV SOLN
INTRAVENOUS | Status: AC
  Filled 2023-10-12: qty 100

## 2023-10-12 SURGICAL SUPPLY — 83 items
BLADE CLIPPER SURG (BLADE) ×2 IMPLANT
BLADE SURG 11 STRL SS (BLADE) ×2 IMPLANT
CANISTER SUCTION 3000ML PPV (SUCTIONS) ×4 IMPLANT
CANNULA REDUCER 12-8 DVNC XI (CANNULA) ×4 IMPLANT
CATH THORACIC 28FR (CATHETERS) ×2 IMPLANT
CHLORAPREP W/TINT 26 (MISCELLANEOUS) ×2 IMPLANT
CLIP TI MEDIUM 6 (CLIP) IMPLANT
CNTNR URN SCR LID CUP LEK RST (MISCELLANEOUS) ×10 IMPLANT
CONN ST 1/4X3/8 BEN (MISCELLANEOUS) IMPLANT
DEFOGGER SCOPE WARM SEASHARP (MISCELLANEOUS) ×2 IMPLANT
DERMABOND ADVANCED .7 DNX12 (GAUZE/BANDAGES/DRESSINGS) ×2 IMPLANT
DRAIN CHANNEL 28F RND 3/8 FF (WOUND CARE) IMPLANT
DRAPE ARM DVNC X/XI (DISPOSABLE) ×8 IMPLANT
DRAPE COLUMN DVNC XI (DISPOSABLE) ×2 IMPLANT
DRAPE CV SPLIT W-CLR ANES SCRN (DRAPES) ×2 IMPLANT
DRAPE HALF SHEET 40X57 (DRAPES) ×2 IMPLANT
DRAPE SURG ORHT 6 SPLT 77X108 (DRAPES) ×2 IMPLANT
ELECT BLADE 6.5 EXT (BLADE) IMPLANT
ELECTRODE REM PT RTRN 9FT ADLT (ELECTROSURGICAL) ×2 IMPLANT
FORCEPS BPLR LNG DVNC XI (INSTRUMENTS) IMPLANT
FORCEPS CADIERE DVNC XI (FORCEP) IMPLANT
GAUZE KITTNER 4X5 RF (MISCELLANEOUS) ×2 IMPLANT
GAUZE KITTNER 4X8 (MISCELLANEOUS) ×2 IMPLANT
GAUZE SPONGE 4X4 12PLY STRL (GAUZE/BANDAGES/DRESSINGS) ×2 IMPLANT
GLOVE BIO SURGEON STRL SZ7.5 (GLOVE) ×4 IMPLANT
GLOVE SURG POLYISO LF SZ8 (GLOVE) ×2 IMPLANT
GLOVE SURG SS PI 8.0 STRL IVOR (GLOVE) ×2 IMPLANT
GOWN STRL REUS W/ TWL LRG LVL3 (GOWN DISPOSABLE) ×4 IMPLANT
GOWN STRL REUS W/ TWL XL LVL3 (GOWN DISPOSABLE) ×4 IMPLANT
GOWN STRL REUS W/TWL 2XL LVL3 (GOWN DISPOSABLE) ×2 IMPLANT
GRASPER TIP-UP FEN DVNC XI (INSTRUMENTS) IMPLANT
HEMOSTAT SURGICEL 2X14 (HEMOSTASIS) ×6 IMPLANT
IRRIGATION STRYKERFLOW (MISCELLANEOUS) IMPLANT
KIT BASIN OR (CUSTOM PROCEDURE TRAY) ×2 IMPLANT
KIT TURNOVER KIT B (KITS) ×2 IMPLANT
NDL 22X1.5 STRL (OR ONLY) (MISCELLANEOUS) ×2 IMPLANT
NEEDLE 22X1.5 STRL (OR ONLY) (MISCELLANEOUS) ×2 IMPLANT
NS IRRIG 1000ML POUR BTL (IV SOLUTION) ×6 IMPLANT
PACK CHEST (CUSTOM PROCEDURE TRAY) ×2 IMPLANT
PAD ARMBOARD POSITIONER FOAM (MISCELLANEOUS) ×10 IMPLANT
PORT ACCESS TROCAR AIRSEAL 12 (TROCAR) ×2 IMPLANT
RELOAD STAPLE 45 2.0 GRY DVNC (STAPLE) IMPLANT
RELOAD STAPLE 45 2.5 WHT DVNC (STAPLE) IMPLANT
RELOAD STAPLE 45 3.5 BLU DVNC (STAPLE) IMPLANT
RELOAD STAPLE 45 4.3 GRN DVNC (STAPLE) IMPLANT
RELOAD STAPLER 2.5X45 WHT DVNC (STAPLE) ×4 IMPLANT
RELOAD STAPLER 3.5X45 BLU DVNC (STAPLE) ×4 IMPLANT
RELOAD STAPLER 4.3X45 GRN DVNC (STAPLE) ×2 IMPLANT
SCISSORS LAP 5X35 DISP (ENDOMECHANICALS) IMPLANT
SEAL UNIV 5-12 XI (MISCELLANEOUS) ×8 IMPLANT
SEALANT PROGEL (MISCELLANEOUS) IMPLANT
SEALER LIGASURE MARYLAND 30 (ELECTROSURGICAL) IMPLANT
SET TRI-LUMEN FLTR TB AIRSEAL (TUBING) ×2 IMPLANT
SOLUTION ELECTROSURG ANTI STCK (MISCELLANEOUS) ×2 IMPLANT
SPONGE INTESTINAL PEANUT (DISPOSABLE) IMPLANT
SPONGE TONSIL 1 RF SGL (DISPOSABLE) IMPLANT
STAPLE RELOAD 45 2.0 GRAY DVNC (STAPLE) ×2 IMPLANT
STAPLER 45 SUREFORM CVD DVNC (STAPLE) IMPLANT
STOPCOCK 4 WAY LG BORE MALE ST (IV SETS) ×2 IMPLANT
SUT MNCRL AB 3-0 PS2 18 (SUTURE) IMPLANT
SUT MON AB 2-0 CT1 36 (SUTURE) IMPLANT
SUT PDS AB 1 CTX 36 (SUTURE) IMPLANT
SUT PROLENE 4-0 RB1 .5 CRCL 36 (SUTURE) IMPLANT
SUT SILK 1 MH (SUTURE) ×2 IMPLANT
SUT SILK 1 TIES 10X30 (SUTURE) IMPLANT
SUT SILK 2 0 SH (SUTURE) IMPLANT
SUT SILK 2 0SH CR/8 30 (SUTURE) IMPLANT
SUT VIC AB 1 CTX36XBRD ANBCTR (SUTURE) IMPLANT
SUT VIC AB 2-0 CT1 TAPERPNT 27 (SUTURE) ×2 IMPLANT
SUT VIC AB 3-0 SH 27X BRD (SUTURE) ×6 IMPLANT
SUT VICRYL 0 TIES 12 18 (SUTURE) ×2 IMPLANT
SUT VICRYL 0 UR6 27IN ABS (SUTURE) ×4 IMPLANT
SYR 10ML LL (SYRINGE) ×2 IMPLANT
SYR 20ML LL LF (SYRINGE) ×2 IMPLANT
SYR 50ML LL SCALE MARK (SYRINGE) ×2 IMPLANT
SYSTEM RETRIEVAL ANCHOR 12 (MISCELLANEOUS) IMPLANT
SYSTEM SAHARA CHEST DRAIN ATS (WOUND CARE) ×2 IMPLANT
TAPE CLOTH 4X10 WHT NS (GAUZE/BANDAGES/DRESSINGS) ×2 IMPLANT
TOWEL GREEN STERILE (TOWEL DISPOSABLE) ×2 IMPLANT
TRAY FOLEY MTR SLVR 16FR STAT (SET/KITS/TRAYS/PACK) ×2 IMPLANT
TRAY FOLEY W/BAG SLVR 14FR (SET/KITS/TRAYS/PACK) ×2 IMPLANT
TUBING EXTENTION W/L.L. (IV SETS) ×2 IMPLANT
WATER STERILE IRR 1000ML POUR (IV SOLUTION) ×2 IMPLANT

## 2023-10-12 NOTE — Anesthesia Procedure Notes (Addendum)
 Procedure Name: Intubation Date/Time: 10/12/2023 1:01 PM  Performed by: Jerl Donald LABOR, CRNAPre-anesthesia Checklist: Patient identified, Emergency Drugs available, Suction available and Patient being monitored Patient Re-evaluated:Patient Re-evaluated prior to induction Oxygen Delivery Method: Circle System Utilized Preoxygenation: Pre-oxygenation with 100% oxygen Induction Type: IV induction Ventilation: Mask ventilation without difficulty Laryngoscope Size: Mac and 3 Grade View: Grade I Tube type: Oral Tube size: 8.5 mm Number of attempts: 1 Airway Equipment and Method: Stylet and Oral airway Placement Confirmation: ETT inserted through vocal cords under direct vision, positive ETCO2 and breath sounds checked- equal and bilateral Secured at: 24 cm Tube secured with: Tape Dental Injury: Teeth and Oropharynx as per pre-operative assessment

## 2023-10-12 NOTE — H&P (Signed)
 [address]       [place] [address]             [phone]                                                   Rani Idler [facility] Medical Record #[id number] Date of Birth: Aug 22, 1950   Referring: [name], NP Primary Care: Mahlon Comer BRAVO, MD Primary Cardiologist: Lynwood Schilling, MD   Chief Complaint:        Chief Complaint  Patient presents with   Lung Lesion      CT chest 4/24      History of Present Illness:    Janet Pierce is a 73 y.o. female who presents for surgical evaluation of a  right middle lobe pulmonary nodule.  This was originally identified via the lung cancer screening program.  She stopped smoking in January of this year.  She denies any shortness of breath, or weight changes.         Past Medical History:  Diagnosis Date   Allergy     Anemia     Anxiety     Arthritis     Cataract     GERD (gastroesophageal reflux disease)     Glaucoma     Hyperlipidemia     Hypertension     Osteoporosis     Retinal detachment      left eye prosthesis   Scoliosis                 Past Surgical History:  Procedure Laterality Date   BUNIONECTOMY Left     CATARACT EXTRACTION Right     EYE SURGERY Right      glucoma   FINGER SURGERY Right      ring finger   LIPOMA EXCISION        hip   RETINAL DETACHMENT SURGERY Left     TONSILLECTOMY                   Family History  Problem Relation Age of Onset   Lung cancer Mother     Hypertension Mother     Bone cancer Mother     Liver cancer Mother     Cancer Father          brain   Other Sister          brain tumor   Hypertension Brother     Diabetes Maternal Grandmother     Fibroids Daughter     Colon cancer Neg Hx     Colon polyps Neg Hx     Esophageal cancer Neg Hx     Rectal cancer Neg Hx     Stomach cancer Neg Hx              Tobacco Use History  Social History        Tobacco Use  Smoking Status Every Day   Current packs/day: 0.50   Average  packs/day: 0.5 packs/day for 40.0 years (20.0 ttl pk-yrs)   Types: Cigarettes  Smokeless Tobacco Never  Tobacco Comments    Resumed smoking in Sept 2023      Social History        Substance and Sexual Activity  Alcohol Use Yes   Alcohol/week: 0.0 standard  drinks of alcohol    Comment: occasionally        Allergies  No Known Allergies           Current Outpatient Medications  Medication Sig Dispense Refill   albuterol  (VENTOLIN  HFA) 108 (90 Base) MCG/ACT inhaler Inhale 2 puffs into the lungs every 6 (six) hours as needed for wheezing or shortness of breath. 8 g 0   albuterol  (VENTOLIN  HFA) 108 (90 Base) MCG/ACT inhaler Inhale 1-2 puffs into the lungs every 6 (six) hours as needed for wheezing or shortness of breath. 1 each 0   amLODipine  (NORVASC ) 10 MG tablet Take 1 tablet (10 mg total) by mouth daily before breakfast. 90 tablet 3   amLODipine  (NORVASC ) 10 MG tablet Take 1 tablet (10 mg total) by mouth daily before breakfast. 90 tablet 3   amoxicillin -clavulanate (AUGMENTIN ) 875-125 MG tablet Take 1 tablet by mouth every 12 (twelve) hours. 14 tablet 0   atorvastatin  (LIPITOR) 40 MG tablet Take 1 tablet (40 mg total) by mouth daily. 90 tablet 3   benzonatate  (TESSALON ) 200 MG capsule Take 1 capsule (200 mg total) by mouth 2 (two) times daily as needed for cough. 20 capsule 0   brimonidine (ALPHAGAN P) 0.1 % SOLN Place 1 drop into the right eye in the morning and at bedtime.       cetirizine  (ZYRTEC ) 10 MG tablet Take 1 tablet (10 mg total) by mouth daily. 30 tablet 6   dorzolamide (TRUSOPT) 2 % ophthalmic solution INSTILL 1 DROP INTO BOTH EYES BID   1   LUMIGAN 0.01 % SOLN 1 drop at bedtime.       Multiple Vitamins-Minerals (MULTIVITAMIN GUMMIES WOMENS) CHEW Chew 2 Doses by mouth every morning.       pantoprazole  (PROTONIX ) 40 MG tablet Take 1 tablet (40 mg total) by mouth daily. 90 tablet 1   timolol  (TIMOPTIC ) 0.5 % ophthalmic solution Place 1 drop into the right eye 2 (two)  times daily.        tiZANidine  (ZANAFLEX ) 4 MG tablet Take 1 tablet (4 mg total) by mouth every 6 (six) hours as needed for muscle spasms. 30 tablet 0   triamcinolone  ointment (KENALOG ) 0.1 % Apply 1 Application topically 2 (two) times daily. 90 g 1   Vitamin D , Ergocalciferol , (DRISDOL ) 1.25 MG (50000 UNIT) CAPS capsule Take 1 capsule (50,000 Units total) by mouth every 7 (seven) days. 12 capsule 1   XDEMVY 0.25 % SOLN Apply 1 drop to eye 2 (two) times daily.          No current facility-administered medications for this visit.        Review of Systems  Constitutional:  Negative for malaise/fatigue and weight loss.  Respiratory:  Negative for cough and shortness of breath.   Cardiovascular:  Negative for chest pain.  Neurological: Negative.         PHYSICAL EXAMINATION: BP 109/65   Pulse (!) 103   Resp 18   Ht 5' 3 (1.6 m)   Wt 137 lb (62.1 kg)   SpO2 98%   BMI 24.27 kg/m  Physical Exam Constitutional:      General: She is not in acute distress.    Appearance: She is not ill-appearing.  HENT:     Head: Normocephalic and atraumatic.  Eyes:     Extraocular Movements: Extraocular movements intact.  Cardiovascular:     Rate and Rhythm: Tachycardia present.  Pulmonary:     Effort: Pulmonary effort is normal. No  respiratory distress.  Abdominal:     General: Abdomen is flat. There is no distension.  Musculoskeletal:        General: Normal range of motion.     Cervical back: Normal range of motion.  Skin:    General: Skin is warm and dry.  Neurological:     General: No focal deficit present.     Mental Status: She is alert and oriented to person, place, and time.                I have independently reviewed the above radiology studies  and reviewed the findings with the patient.    Recent Lab Findings: Recent Labs       Lab Results  Component Value Date    WBC 2.6 (L) 03/19/2023    HGB 12.3 03/19/2023    HCT 37.3 03/19/2023    PLT 222 03/19/2023     GLUCOSE 100 (H) 03/19/2023    CHOL 162 08/21/2022    TRIG 104.0 08/21/2022    HDL 48.30 08/21/2022    LDLDIRECT 134.8 01/27/2013    LDLCALC 93 08/21/2022    ALT 26 08/21/2022    AST 25 08/21/2022    NA 139 03/19/2023    K 3.5 03/19/2023    CL 101 03/19/2023    CREATININE 0.67 03/19/2023    BUN 7 (L) 03/19/2023    CO2 28 03/19/2023    TSH 2.50 08/21/2022    HGBA1C 5.9 08/08/2020        Diagnostic Studies & Laboratory data:     Recent Radiology Findings:    Imaging Results  No results found.       PFTs:   - FVC: 55% - FEV1: 62% -DLCO: 60%       Assessment / Plan:   73 y.o. female with a right middle lobe pulmonary nodule that has been followed through the lung cancer screening program.  The most recent results show that there is solid component that is developing.  I explained to her that this is concerning for primary lung cancer. We discussed the risks and benefits of bronchoscopic marking followed by a right robotic assisted wedge resection, and possible lobectomy.  She is agreeable to proceed.  Janet Pierce MALVA Rayas

## 2023-10-12 NOTE — Transfer of Care (Signed)
 Immediate Anesthesia Transfer of Care Note  [name]  Procedure(s) Performed: VIDEO BRONCHOSCOPY WITH ENDOBRONCHIAL NAVIGATION  Patient Location: PACU  Anesthesia Type:General  Level of Consciousness: awake, oriented, and drowsy  Airway & Oxygen Therapy: Patient Spontanous Breathing  Post-op Assessment: Report given to RN, Post -op Vital signs reviewed and stable, and Patient moving all extremities  Post vital signs: Reviewed and stable  Last Vitals:  Vitals Value Taken Time  BP 161/99 10/12/23 16:08  Temp    Pulse 87 10/12/23 16:13  Resp 12 10/12/23 16:13  SpO2 98 % 10/12/23 16:13  Vitals shown include unfiled device data.  Last Pain:  Vitals:   10/12/23 0917  TempSrc:   PainSc: 3       Patients Stated Pain Goal: 1 (10/12/23 0917)  Complications: No notable events documented.

## 2023-10-12 NOTE — Brief Op Note (Signed)
 10/12/2023  3:52 PM  PATIENT:  Janet Pierce  73 y.o. female  PRE-OPERATIVE DIAGNOSIS:  PULMONARY NODULE  POST-OPERATIVE DIAGNOSIS:  PULMONARY NODULE  PROCEDURE:  RIGHT MIDDLE LOBECTOMY, LUNG, ROBOT-ASSISTED, USING VATS   BLOCK, NERVE, INTERCOSTAL  LYMPH NODE BIOPSY  SURGEON:  Lightfoot, Linnie KIDD, MD   PHYSICIAN ASSISTANT: Jordyan Hardiman  ASSISTANTS: Trudy Ami CROME, CST, Scrub Person    ANESTHESIA:   general  EBL:60ml  BLOOD ADMINISTERED:none  DRAINS: right pleural tube   LOCAL MEDICATIONS USED:  Exparel  SPECIMEN:  Right middle lung lobe, lymph nodes  DISPOSITION OF SPECIMEN:  PATHOLOGY  COUNTS:  Correct  DICTATION: .Dragon Dictation  PLAN OF CARE: Admit to inpatient   PATIENT DISPOSITION:  PACU - hemodynamically stable.   Delay start of Pharmacological VTE agent (>24hrs) due to surgical blood loss or risk of bleeding: no

## 2023-10-12 NOTE — Anesthesia Procedure Notes (Signed)
 Arterial Line Insertion Start/End6/23/2025 10:35 AM, 10/12/2023 10:45 AM Performed by: Boyce Shilling, CRNA, CRNA  Patient location: Pre-op. Preanesthetic checklist: patient identified, IV checked, site marked, risks and benefits discussed, surgical consent, monitors and equipment checked, pre-op evaluation, timeout performed and anesthesia consent Lidocaine 1% used for infiltration Left, radial was placed Catheter size: 20 G Hand hygiene performed  and maximum sterile barriers used  Allen's test indicative of satisfactory collateral circulation Attempts: 1 Procedure performed without using ultrasound guided technique. Following insertion, dressing applied and Biopatch. Post procedure assessment: normal  Patient tolerated the procedure well with no immediate complications. Additional procedure comments: Aline placed by [employer].

## 2023-10-12 NOTE — Discharge Instructions (Signed)

## 2023-10-12 NOTE — Op Note (Signed)
      [address]       [name] [address]             [phone]        10/12/2023  Patient:  Apolinar Louder Pre-Op Dx: Right middle lobe pulmonary nodule   Post-op Dx:  Right middle lobe NSCLC Procedure: - Robotic assisted right video thoracoscopy - Right middle lobectomy - Mediastinal lymph node sampling - Intercostal nerve block  Surgeon and Role:      * Yatzary Merriweather, Linnie KIDD, MD - Primary  Assistant: EMERSON Becket, PA-C  An experienced assistant was required given the complexity of this surgery and the standard of surgical care. The assistant was needed for exposure, dissection, suctioning, retraction of delicate tissues and sutures, instrument exchange and for overall help during this procedure.    Anesthesia  general EBL:  13 ml Blood Administration: same Specimen:  right middle lobe, hilar and mediastinal nodes  Drains: 28 F argyle chest tube in right chest Counts: correct   Indications: 73 y.o. female with a right middle lobe pulmonary nodule that has been followed through the lung cancer screening program.  The most recent results show that there is solid component that is developing.  I explained to her that this is concerning for primary lung cancer. We discussed the risks and benefits of bronchoscopic marking followed by a right robotic assisted wedge resection, and possible lobectomy.  She is agreeable to proceed.   Findings: Normal anatomy.  The ICG marking identified an area that would have been difficult to wedge out without taking the majority of the middle lobe.  I elected to proceed with a lobectomy  Operative Technique: After the risks, benefits and alternatives were thoroughly discussed, the patient was brought to the operative theatre.  Anesthesia was induced, and the patient was then placed in a lateral decubitus position and was prepped and draped in normal sterile fashion.  An appropriate surgical pause was performed, and  pre-operative antibiotics were dosed accordingly.  We began by placing our 4 robotic ports in the the 7th intercostal space targeting the hilum of the lung.    The robot was then docked and all instruments were passed under direct visualization.    The lung was then retracted superiorly, and the inferior pulmonary ligament was divided.  The hilum was mobilized anteriorly and posteriorly.  We identified the middle lobe pulmonary vein, and after careful isolation, it was divided with a vascular stapler.  We next moved to the pulmonary artery.  The artery was then divided with a vascular load stapler.  The bronchus to the middle lobe was then isolated.  After a test clamp, with good ventilation of the remaining lung, the bronchus was then divided.  The fissure was completed, and the specimen was passed into an endocatch bag.  It was removed from the anterior access site.    Lymph nodes were then sampled at hilum and mediastinum.  The chest was irrigated, and an air leak test was performed.  An intercostal nerve block was performed under direct visualization.  A 28 F chest tube was then placed, and we watch the remaining lobes re-expand.  The skin and soft tissue were closed with absorbable suture    The patient tolerated the procedure without any immediate complications, and was transferred to the PACU in stable condition.  Sabra Sessler KIDD Rayas

## 2023-10-12 NOTE — Anesthesia Procedure Notes (Signed)
 Procedure Name: Intubation Date/Time: 10/12/2023 1:52 PM  Performed by: Jerl Donald LABOR, CRNAPre-anesthesia Checklist: Patient identified, Emergency Drugs available, Suction available and Patient being monitored Patient Re-evaluated:Patient Re-evaluated prior to induction Oxygen Delivery Method: Circle System Utilized Preoxygenation: Pre-oxygenation with 100% oxygen Induction Type: IV induction Ventilation: Mask ventilation without difficulty Laryngoscope Size: Glidescope and 4 Grade View: Grade I Tube type: Oral Endobronchial tube: Double lumen EBT and 37 Fr Number of attempts: 1 Airway Equipment and Method: Stylet and Video-laryngoscopy Placement Confirmation: ETT inserted through vocal cords under direct vision, positive ETCO2 and breath sounds checked- equal and bilateral Secured at: 27 cm Tube secured with: Tape Dental Injury: Teeth and Oropharynx as per pre-operative assessment

## 2023-10-12 NOTE — Plan of Care (Signed)
  Problem: Elimination: Goal: Will not experience complications related to urinary retention Outcome: Progressing   Problem: Pain Managment: Goal: General experience of comfort will improve and/or be controlled Outcome: Progressing   Problem: Education: Goal: Knowledge of disease or condition will improve Outcome: Progressing   Problem: Clinical Measurements: Goal: Postoperative complications will be avoided or minimized Outcome: Progressing   Problem: Respiratory: Goal: Respiratory status will improve Outcome: Progressing

## 2023-10-12 NOTE — Hospital Course (Signed)
 Referring: [name], NP Primary Care: Mahlon Comer BRAVO, MD Primary Cardiologist: Lynwood Schilling, MD      History of Present Illness:    Janet Pierce is a 73 y.o. female who presents for surgical evaluation of a  right middle lobe pulmonary nodule.  This was originally identified via the lung cancer screening program.  She stopped smoking in January of this year.  She denies any shortness of breath, or weight changes. The most recent results show that there is solid component that is developing.  I explained to her that this is concerning for primary lung cancer. We discussed the risks and benefits of bronchoscopic marking followed by a right robotic assisted wedge resection, and possible lobectomy.  She is agreeable to proceed.   Hospital Course: Janet Pierce was admitted to the hospital for elective surgery on 10/12/2023.  She was taken to the operating room where robotic assisted right middle lobectomy was carried out along with lymph node dissection and intercostal nerve block with Exparel.  Following the procedure, she was recovered in the postanesthesia care unit and later transferred to progressive care.

## 2023-10-12 NOTE — Discharge Summary (Signed)
 Physician Discharge Summary  Patient ID: Janet Pierce MRN: [id number] DOB/AGE: 73-Mar-1952 73 y.o.  Admit date: 10/12/2023 Discharge date: 10/13/2023  Admission Diagnoses:  Right middle lobe lung nodule History of hypertension History of supraventricular tachycardia Gastroesophageal reflux disease History of depression  Discharge Diagnoses:  Right middle lobe lung nodule Status post right middle lobectomy with lymph node dissection History of hypertension History of supraventricular tachycardia Gastroesophageal reflux disease History of depression   Discharged Condition: stable  Consults: None  History of Present Illness:    Janet Pierce is a 73 y.o. female who presents for surgical evaluation of a  right middle lobe pulmonary nodule.  This was originally identified via the lung cancer screening program.  She stopped smoking in January of this year.  She denies any shortness of breath, or weight changes. The most recent results show that there is solid component that is developing.  I explained to her that this is concerning for primary lung cancer. We discussed the risks and benefits of bronchoscopic marking followed by a right robotic assisted wedge resection, and possible lobectomy.  She is agreeable to proceed.   Hospital Course: Janet Pierce was admitted to the hospital for elective surgery on 10/12/2023.  She was taken to the operating room where robotic assisted right middle lobectomy was carried out along with lymph node dissection and intercostal nerve block with Exparel.  Following the procedure, she was recovered in the postanesthesia care unit and later transferred to progressive care.  She maintained stable respiratory status.  Follow-up chest x-rays obtained immediately postoperatively and on the morning of postop day 1 showed good expansion of both lungs.  On postop day 1, there was no airleak and she had had only minimal serosanguineous drainage from the tube  overnight.  The chest tube was removed and follow-up chest x-ray obtained midday on postop day 1.  This showed no pneumothorax. At this point, Janet Pierce was independently mobile and tolerating regular diet without any difficulty.  She was voiding without difficulty following removal of the Foley catheter.  Janet Pierce felt she was ready to return home.  She was discharged in the afternoon on postop day 1.  Instructions were given regarding diet, activity, and wound care.  Follow-up with Dr. Shyrl was arranged.  Significant Diagnostic Studies:   CLINICAL DATA:  Status post partial right lobectomy.   EXAM: PORTABLE CHEST 1 VIEW   COMPARISON:  October 12, 2023.   FINDINGS: Stable cardiomegaly. Hypoinflation of the lungs is noted with minimal bibasilar subsegmental atelectasis. Right-sided chest tube is noted without definite pneumothorax. Severe S-shaped scoliosis of thoracic and lumbar spine is again noted.   IMPRESSION: Hypoinflation of the lungs with minimal bibasilar subsegmental atelectasis. Right-sided chest tube without pneumothorax.     Electronically Signed   By: Lynwood Landy Raddle M.D.   On: 10/13/2023 09:53  Treatments: Surgery  10/12/23 Patient:  Apolinar Louder Pre-Op Dx: right middle lobe pulmonary nodule   Post-op Dx:  same Procedure: Flexible bronchoscopy Robotic assisted bronchoscopy with marking of right middle lobe pulmonary nodule   Surgeon and Role:      * Lightfoot, Linnie KIDD, MD - Primary   Anesthesia  general EBL:  minimal   Indications: 73 y.o. female with a right middle lobe pulmonary nodule that has been followed through the lung cancer screening program. The most recent results show that there is solid component that is developing. I explained to her that this is concerning for primary lung cancer. We discussed the  risks and benefits of bronchoscopic marking followed by a right robotic assisted wedge resection, and possible lobectomy. She is agreeable  to proceed.    Findings: Normal anatomy  Pathology: Pending  Discharge Exam: Blood pressure 121/72, pulse 90, temperature 98.6 F (37 C), temperature source Oral, resp. rate 19, height 5' 3 (1.6 m), weight 65.1 kg, SpO2 95%.  General appearance: alert, cooperative, and no distress Neurologic: intact Heart: Regular rate and rhythm Lungs: Normal work of breathing and adequate oxygen saturations on room air.  No airleak from the chest tube.  Draining extremities with, serosanguineous, 140 mL since surgery.  The chest x-ray shows good expansion of the lungs bilaterally.  Clear lung fields. Wound: Incisions intact  Disposition: Discharge disposition: 01-Home or Self Care     Discharged to home in stable condition.   Allergies as of 10/13/2023   No Known Allergies      Medication List     TAKE these medications    acetaminophen  500 MG tablet Commonly known as: TYLENOL  Take 1 tablet (500 mg total) by mouth every 6 (six) hours as needed.   amLODipine  10 MG tablet Commonly known as: NORVASC  Take 1 tablet (10 mg total) by mouth daily before breakfast.   atorvastatin  40 MG tablet Commonly known as: LIPITOR Take 1 tablet (40 mg total) by mouth daily.   B-12 PO Take 1 tablet by mouth daily.   brimonidine 0.2 % ophthalmic solution Commonly known as: ALPHAGAN Place 1 drop into the right eye 2 (two) times daily.   docusate sodium  100 MG capsule Commonly known as: Colace Take 1 capsule (100 mg total) by mouth daily.   dorzolamide-timolol  2-0.5 % ophthalmic solution Commonly known as: COSOPT Place 1 drop into the right eye 2 (two) times daily.   Lumigan 0.01 % Soln Generic drug: bimatoprost Place 1 drop into the right eye at bedtime.   MUSCLE RUB EX Apply 1 Application topically as needed (Pain).   pantoprazole  40 MG tablet Commonly known as: PROTONIX  Take 1 tablet (40 mg total) by mouth daily. What changed:  when to take this reasons to take this   SYSTANE  FREE OP Apply 1 drop to eye as needed (Dry eye).   tiZANidine  4 MG tablet Commonly known as: ZANAFLEX  Take 1 tablet (4 mg total) by mouth every 6 (six) hours as needed for muscle spasms.   traMADol  50 MG tablet Commonly known as: ULTRAM  Take 1 tablet (50 mg total) by mouth every 6 (six) hours as needed (mild pain).   Vitamin D  (Ergocalciferol ) 1.25 MG (50000 UNIT) Caps capsule Commonly known as: DRISDOL  Take 1 capsule (50,000 Units total) by mouth every 7 (seven) days.        Follow-up Information     Shyrl Linnie KIDD, MD. Go on 10/30/2023.   Specialty: Cardiothoracic Surgery Why: Your follow-up appointment is at 10:20am. Contact information: [address] [place] [address] [phone]                  Signed: Laurel JUDITHANN Becket, PA-C 10/13/2023, 3:20 PM

## 2023-10-13 ENCOUNTER — Encounter (HOSPITAL_COMMUNITY): Payer: Self-pay | Admitting: Thoracic Surgery (Cardiothoracic Vascular Surgery)

## 2023-10-13 ENCOUNTER — Inpatient Hospital Stay (HOSPITAL_COMMUNITY)

## 2023-10-13 LAB — BASIC METABOLIC PANEL WITH GFR
Anion gap: 7 (ref 5–15)
BUN: 10 mg/dL (ref 8–23)
CO2: 28 mmol/L (ref 22–32)
Calcium: 8.9 mg/dL (ref 8.9–10.3)
Chloride: 99 mmol/L (ref 98–111)
Creatinine, Ser: 0.98 mg/dL (ref 0.44–1.00)
GFR, Estimated: >60 mL/min (ref >60)
Glucose, Bld: 157 mg/dL — ABNORMAL HIGH (ref 70–99)
Potassium: 3.6 mmol/L (ref 3.5–5.1)
Sodium: 134 mmol/L — ABNORMAL LOW (ref 135–145)

## 2023-10-13 LAB — CBC
HCT: 34.7 % — ABNORMAL LOW (ref 36.0–46.0)
Hemoglobin: 11.5 g/dL — ABNORMAL LOW (ref 12.0–15.0)
MCH: 27.1 pg (ref 26.0–34.0)
MCHC: 33.1 g/dL (ref 30.0–36.0)
MCV: 81.8 fL (ref 80.0–100.0)
Platelets: 245 10*3/uL (ref 150–400)
RBC: 4.24 MIL/uL (ref 3.87–5.11)
RDW: 12.4 % (ref 11.5–15.5)
WBC: 11.6 10*3/uL — ABNORMAL HIGH (ref 4.0–10.5)
nRBC: 0 % (ref 0.0–0.2)

## 2023-10-13 MED ORDER — AMLODIPINE BESYLATE 5 MG PO TABS: 5.0000 mg | ORAL_TABLET | Freq: Every day | ORAL | Status: DC

## 2023-10-13 MED ORDER — TRAMADOL HCL 50 MG PO TABS: 50.0000 mg | ORAL_TABLET | Freq: Four times a day (QID) | ORAL | 0 refills | Status: AC | PRN

## 2023-10-13 MED ORDER — ACETAMINOPHEN 500 MG PO TABS: 500.0000 mg | ORAL_TABLET | Freq: Four times a day (QID) | ORAL | 0 refills | Status: AC | PRN

## 2023-10-13 MED ORDER — DOCUSATE SODIUM 100 MG PO CAPS: 100.0000 mg | ORAL_CAPSULE | Freq: Every day | ORAL | 0 refills | Status: AC

## 2023-10-13 NOTE — Progress Notes (Signed)
 Mobility Specialist Progress Note;   10/13/23 0906  Mobility  Activity Ambulated with assistance in hallway  Level of Assistance Standby assist, set-up cues, supervision of patient - no hands on  Assistive Device None  Distance Ambulated (ft) 300 ft  Activity Response Tolerated well  Mobility Referral Yes  Mobility visit 1 Mobility  Mobility Specialist Start Time (ACUTE ONLY) [id number]  Mobility Specialist Stop Time (ACUTE ONLY) 0913  Mobility Specialist Time Calculation (min) (ACUTE ONLY) 7 min   Pt eager for mobility. Required no physical assistance during ambulation, SV. VSS throughout and no c/o when asked. Pt returned back to bed with all needs met, call bell in reach. Daughter in room.   Lauraine Erm Mobility Specialist Please contact via SecureChat or [employer] [phone]

## 2023-10-13 NOTE — Progress Notes (Addendum)
      [address]       [name] [address]             [phone]      1 Day Post-Op Procedure(s) (LRB): LOBECTOMY, LUNG, ROBOT-ASSISTED, USING VATS (Right) BLOCK, NERVE, INTERCOSTAL LYMPH NODE BIOPSY Subjective: In the bedside chair.  Has walked a full lap in the hallway this morning. States pain controlled adequately. On room air.  Objective: Vital signs in last 24 hours: Temp:  [97.2 F (36.2 C)-98.9 F (37.2 C)] 98.9 F (37.2 C) (06/24 0300) Pulse Rate:  [68-98] 90 (06/24 0300) Cardiac Rhythm: Normal sinus rhythm (06/23 1820) Resp:  [10-21] 19 (06/24 0300) BP: (109-164)/(67-95) 124/67 (06/24 0606) SpO2:  [93 %-98 %] 93 % (06/24 0300) Weight:  [62.4 kg-65.1 kg] 65.1 kg (06/23 1800)    Intake/Output from previous day: 06/23 0701 - 06/24 0700 In: 1560 [P.O.:360; I.V.:1000; IV Piggyback:200] Out: 1990 [Urine:1800; Blood:50; Chest Tube:140] Intake/Output this shift: No intake/output data recorded.  General appearance: alert, cooperative, and no distress Neurologic: intact Heart: Regular rate and rhythm Lungs: Normal work of breathing and adequate oxygen saturations on room air.  No airleak from the chest tube.  Draining extremities with, serosanguineous, 140 mL since surgery.  The chest x-ray shows good expansion of the lungs bilaterally.  Clear lung fields. Wound: Incisions intact  Lab Results: Recent Labs    10/13/23 0246  WBC 11.6*  HGB 11.5*  HCT 34.7*  PLT 245   BMET:  Recent Labs    10/13/23 0246  NA 134*  K 3.6  CL 99  CO2 28  GLUCOSE 157*  BUN 10  CREATININE 0.98  CALCIUM  8.9    PT/INR: No results for input(s): LABPROT, INR in the last 72 hours. ABG No results found for: PHART, HCO3, TCO2, ACIDBASEDEF, O2SAT CBG (last 3)  No results for input(s): GLUCAP in the last 72 hours.  Assessment/Plan: S/P Procedure(s) (LRB): LOBECTOMY, LUNG, ROBOT-ASSISTED, USING VATS (Right) BLOCK, NERVE, INTERCOSTAL LYMPH  NODE BIOPSY  - Postop day 1 right middle lobectomy.  Path is pending.  Stable respiratory status,  no airleak.  Will remove the chest this morning and get a follow-up chest x-ray  at noon today.  Continue to mobilize as able for pulmonary hygiene.  Possible discharge later today.   - History of hypertension: Controlled with amlodipine  prior to admission.  MAP mid 80s to 90s.  Will resume amlodipine  at half dose this morning.  -DVT prophylaxis: Ambulate, started enoxaparin  earlier this morning.   LOS: 1 day    Myron G. Roddenberry, PA-C 10/13/2023   Agree.  Doing well Will remove CT Home today  Kloey Cazarez O Satori Krabill

## 2023-10-13 NOTE — TOC CM/SW Note (Signed)
 Transition of Care [facility]) - Inpatient Brief Assessment   Patient Details  Name: Janet Pierce MRN: [id number] Date of Birth: February 22, 1951  [facility] CM/SW Contact:    Lauraine FORBES Saa, LCSW Phone Number: 10/13/2023, 9:09 AM   Clinical Narrative:  9:09 AM Per chart review, patient resides at home with relatives. Patient has a PCP and insurance. Patient does not have SNF/HH/DME history. Patient's preferred pharmacy's are [facility] [phone] [facility] and [facility]. No TOC needs were identified at this time. TOC will continue to follow and be available to assist.  Transition of Care Asessment: Insurance and Status: Insurance coverage has been reviewed Patient has primary care physician: Yes Home environment has been reviewed: Private Residence Prior level of function:: N/A Prior/Current Home Services: No current home services Social Drivers of Health Review: SDOH reviewed no interventions necessary Readmission risk has been reviewed: Yes Transition of care needs: no transition of care needs at this time

## 2023-10-13 NOTE — Anesthesia Postprocedure Evaluation (Signed)
 Anesthesia Post Note  [name]  Procedure(s) Performed: VIDEO BRONCHOSCOPY WITH ENDOBRONCHIAL NAVIGATION     Patient location during evaluation: PACU Anesthesia Type: General Level of consciousness: awake and alert Pain management: pain level controlled Vital Signs Assessment: post-procedure vital signs reviewed and stable Respiratory status: spontaneous breathing, nonlabored ventilation, respiratory function stable and patient connected to nasal cannula oxygen Cardiovascular status: blood pressure returned to baseline and stable Postop Assessment: no apparent nausea or vomiting Anesthetic complications: no   No notable events documented.  Last Vitals:  Vitals:   10/13/23 0748 10/13/23 1106  BP: 131/78 121/72  Pulse:    Resp: (!) 23 19  Temp: 36.9 C 37 C  SpO2: 95% 95%    Last Pain:  Vitals:   10/13/23 1106  TempSrc: Oral  PainSc:                  Lynwood MARLA Cornea

## 2023-10-13 NOTE — Progress Notes (Signed)
 Pt ambulated in the hall, tolerated well, returned pt back to her room, OOB sitting in the chair. Call bell within reach.   Lonell LITTIE Lyme, RN

## 2023-10-13 NOTE — Op Note (Signed)
      [address]       [name] [address]             [phone]                                          10/12/23 Patient:  Janet Pierce Pre-Op Dx: right middle lobe pulmonary nodule   Post-op Dx:  same Procedure: Flexible bronchoscopy Robotic assisted bronchoscopy with marking of right middle lobe pulmonary nodule   Surgeon and Role:      * Agustin Swatek, Linnie KIDD, MD - Primary   Anesthesia  general EBL:  minimal    Indications: 73 y.o. female with a right middle lobe pulmonary nodule that has been followed through the lung cancer screening program. The most recent results show that there is solid component that is developing. I explained to her that this is concerning for primary lung cancer. We discussed the risks and benefits of bronchoscopic marking followed by a right robotic assisted wedge resection, and possible lobectomy. She is agreeable to proceed.   Findings: Normal anatomy  Operative Technique: Prior to the date of the procedure a high-resolution CT scan of the chest was performed. Utilizing ION software program a virtual tracheobronchial tree was generated to allow the creation of distinct navigation pathways to the patient's parenchymal abnormalities.  The patient was brought to the endoscopy suite.  After anesthesia was induced, and time out was performed.  The video fiberoptic bronchoscope was introduced via the endotracheal tube and a general inspection was performed which showed normal right and left lung anatomy Aspiration of the bilateral mainstems was completed to remove any remaining secretions. The robotic catheter was then inserted into patient's endotracheal tube.   Target #1 right middle lobe: The distinct navigation pathways prepared prior to this procedure were then utilized to navigate to patient's lesion identified on CT scan. The robotic catheter was secured into place and the vision probe was withdrawn.  Lesion location was  approximated using fluoroscopy.  Local registration and targeting was performed using Cios three-dimensional imaging.  Under fluoroscopic guidance 0.5 cc of 1: 1 indocyanine green: Methylene blue was injected into the nodule using a transbronchial needle.  Needle in lesion was confirmed using Cios.   The patient tolerated the procedure without any immediate complications, and was taken to the operating room for resection of the marked lesion.    [name]

## 2023-10-14 ENCOUNTER — Telehealth: Payer: Self-pay | Admitting: *Deleted

## 2023-10-14 LAB — SURGICAL PATHOLOGY

## 2023-10-14 NOTE — Transitions of Care (Post Inpatient/ED Visit) (Signed)
   10/14/2023  Name: Janet Pierce MRN: [id number] DOB: 07/20/1950  Today's TOC FU Call Status: Today's TOC FU Call Status:: Unsuccessful Call (1st Attempt) Unsuccessful Call (1st Attempt) Date: 10/14/23  Attempted to reach the patient regarding the most recent Inpatient visit.  Left HIPAA compliant voice message requesting call back  Follow Up Plan: Additional outreach attempts will be made to reach the patient to complete the Transitions of Care (Post Inpatient/ED visit) call.   Pls call/ message for questions,  Annisten Manchester Mckinney Fannye Myer, RN, BSN, CCRN Alumnus RN Care Manager  Transitions of Care  [facility] [phone]: direct office

## 2023-10-15 ENCOUNTER — Telehealth: Payer: Self-pay | Admitting: *Deleted

## 2023-10-15 NOTE — Transitions of Care (Post Inpatient/ED Visit) (Signed)
   10/15/2023  Name: Janet Pierce MRN: [id number] DOB: 1951-03-03  Today's TOC FU Call Status: Today's TOC FU Call Status:: Unsuccessful Call (2nd Attempt) Unsuccessful Call (2nd Attempt) Date: 10/15/23  Attempted to reach the patient regarding the most recent Inpatient visit.  Left HIPAA compliant voice message requesting call back   Follow Up Plan: Additional outreach attempts will be made to reach the patient to complete the Transitions of Care (Post Inpatient/ED visit) call.   Pls call/ message for questions,  Caliana Spires Mckinney Jacara Benito, RN, BSN, CCRN Alumnus RN Care Manager  Transitions of Care  [facility] [phone]: direct office

## 2023-10-16 ENCOUNTER — Telehealth: Payer: Self-pay | Admitting: *Deleted

## 2023-10-16 ENCOUNTER — Ambulatory Visit
Attending: Thoracic Surgery (Cardiothoracic Vascular Surgery) | Admitting: Thoracic Surgery (Cardiothoracic Vascular Surgery)

## 2023-10-16 ENCOUNTER — Encounter: Payer: Self-pay | Admitting: Thoracic Surgery (Cardiothoracic Vascular Surgery)

## 2023-10-16 VITALS — BP 135/75 | HR 94 | Resp 20 | Ht 63.0 in | Wt 139.4 lb

## 2023-10-16 DIAGNOSIS — Z902 Acquired absence of lung [part of]: Secondary | ICD-10-CM

## 2023-10-16 NOTE — Transitions of Care (Post Inpatient/ED Visit) (Signed)
 10/16/2023  Name: Janet Pierce MRN: [id number] DOB: 06-21-1950  Today's TOC FU Call Status: Today's TOC FU Call Status:: Successful TOC FU Call Completed TOC FU Call Complete Date: 10/16/23 Patient's Name and Date of Birth confirmed.  Transition Care Management Follow-up Telephone Call Date of Discharge: 10/13/23 Discharge Facility: [facility]) Type of Discharge: Inpatient Admission Primary Inpatient Discharge Diagnosis:: Elective/ planned (R) middle lobe VATS/ lobectomy: overnight admission- pulmonary nodule How have you been since you were released from the hospital?: Better (I am doing great; hardly any pain at all, and the incision looks fine- healing well, I see Dr. Shyrl today, so he will also look at it.  Breathing fine and pretty much back to my normal self- I have lots of family members looking in on me) Any questions or concerns?: No  Items Reviewed: Did you receive and understand the discharge instructions provided?: Yes (thoroughly reviewed with patient who verbalizes good understanding of same) Medications obtained,verified, and reconciled?: Yes (Medications Reviewed) (Full medication reconciliation/ review completed; no concerns or discrepancies identified; confirmed patient obtained/ is taking all newly Rx'd medications as instructed; self-manages medications and denies questions/ concerns around medications today) Any new allergies since your discharge?: No Dietary orders reviewed?: Yes Type of Diet Ordered:: Healthy as possible Do you have support at home?: Yes People in Home [RPT]: grandchild(ren) Name of Support/Comfort Primary Source: Reports independent in self-care activities; resides with supportive granddaughter- assists as/ if needed/ indicated  Medications Reviewed Today: Medications Reviewed Today     Reviewed by Miklos Bidinger M, RN (Registered Nurse) on 10/16/23 at [phone]  Med List Status: <None>   Medication Order Taking? Sig Documenting  Provider Last Dose Status Informant  acetaminophen  (TYLENOL ) 500 MG tablet [id number] Yes Take 1 tablet (500 mg total) by mouth every 6 (six) hours as needed. Shyrl Linnie KIDD, MD  Active   amLODipine  (NORVASC ) 10 MG tablet [id number] Yes Take 1 tablet (10 mg total) by mouth daily before breakfast. Tabori, Katherine E, MD  Active Self, Pharmacy Records  atorvastatin  (LIPITOR) 40 MG tablet [id number] Yes Take 1 tablet (40 mg total) by mouth daily. Lavona Agent, MD  Active Self, Pharmacy Records  brimonidine  (ALPHAGAN ) 0.2 % ophthalmic solution [id number] Yes Place 1 drop into the right eye 2 (two) times daily. [provider]  Active Self, Pharmacy Records  Cyanocobalamin  (B-12 PO) [id number] Yes Take 1 tablet by mouth daily. [provider]  Active Self, Pharmacy Records  docusate sodium  (COLACE) 100 MG capsule [id number]  Take 1 capsule (100 mg total) by mouth daily.  Patient not taking: Reported on 10/16/2023   Lightfoot, Harrell O, MD  Active   dorzolamide -timolol  (COSOPT ) 2-0.5 % ophthalmic solution [id number] Yes Place 1 drop into the right eye 2 (two) times daily. [provider]  Active Self, Pharmacy Records  LUMIGAN 0.01 % SOLN [id number] Yes Place 1 drop into the right eye at bedtime. [provider]  Active Self, Pharmacy Records  Menthol-Methyl Salicylate (MUSCLE RUB EX) [id number] Yes Apply 1 Application topically as needed (Pain). [provider]  Active Self, Pharmacy Records  pantoprazole  (PROTONIX ) 40 MG tablet [id number] Yes Take 1 tablet (40 mg total) by mouth daily. Tabori, Katherine E, MD  Active Self, Pharmacy Records  Polyethyl Glycol-Propyl Glycol [facility] FREE OP) [id number] Yes Apply 1 drop to eye as needed (Dry eye). [provider]  Active Self, Pharmacy Records  tiZANidine  (ZANAFLEX ) 4 MG tablet [id number] Yes Take 1 tablet (4 mg total) by mouth every 6 (  six) hours as needed for muscle spasms. Tabori, Katherine E, MD  Active  Self, Pharmacy Records  traMADol  (ULTRAM ) 50 MG tablet [id number] Yes Take 1 tablet (50 mg total) by mouth every 6 (six) hours as needed (mild pain). Shyrl Linnie KIDD, MD  Active   Vitamin D , Ergocalciferol , (DRISDOL ) 1.25 MG (50000 UNIT) CAPS capsule [id number] Yes Take 1 capsule (50,000 Units total) by mouth every 7 (seven) days. Mahlon Comer BRAVO, MD  Active Self, Pharmacy Records           Home Care and Equipment/Supplies: Were Home Health Services Ordered?: No Any new equipment or medical supplies ordered?: No  Functional Questionnaire: Do you need assistance with bathing/showering or dressing?: No Do you need assistance with meal preparation?: No Do you need assistance with eating?: No Do you have difficulty maintaining continence: No Do you need assistance with getting out of bed/getting out of a chair/moving?: No Do you have difficulty managing or taking your medications?: No  Follow up appointments reviewed: PCP Follow-up appointment confirmed?: NA (verified not indicated per hospital discharging provider discharge notes; patient declines scheduling with PCP today: states no need, I am seeing the surgeon later today, will call later if I need to) Specialist Hospital Follow-up appointment confirmed?: Yes Date of Specialist follow-up appointment?: 10/16/23 Follow-Up Specialty Provider:: surgical provider- Dr. Shyrl Do you need transportation to your follow-up appointment?: No Do you understand care options if your condition(s) worsen?: Yes-patient verbalized understanding  SDOH Interventions Today    Flowsheet Row Most Recent Value  SDOH Interventions   Food Insecurity Interventions Intervention Not Indicated  Housing Interventions Intervention Not Indicated  Transportation Interventions Intervention Not Indicated  [drives self at baseline,  garnddaughter/ family assists as needed post- recent surgery]  Utilities Interventions Intervention Not Indicated    Patient declines need for ongoing/ further care management/ coordination outreach; declines enrollment in 30-day TOC program- declines taking my direct phone number should needs/ concerns arise post-TOC call   See TOC assessment tabs for additional assessment/ TOC intervention information  Pls call/ message for questions,  Arnoldo Hildreth Mckinney Dhalia Zingaro, RN, BSN, CCRN Alumnus RN Care Manager  Transitions of Care  [facility] [phone]: direct office

## 2023-10-19 NOTE — Progress Notes (Signed)
 [address]       [place] [address]             [phone]        [name] [facility] Medical Record #[id number] Date of Birth: May 15, 1950  Referring: [name], NP Primary Care: Mahlon Comer BRAVO, MD Primary Cardiologist:James Lavona, MD  Reason for visit:   follow-up  History of Present Illness:     Janet Pierce presents for their first follow-up appointment.  Overall, she is doing well.    Physical Exam: BP 135/75 (BP Location: Right Arm, Patient Position: Sitting, Cuff Size: Normal)   Pulse 94   Resp 20   Ht 5' 3 (1.6 m)   Wt 139 lb 6.4 oz (63.2 kg)   SpO2 93% Comment: RA  BMI 24.69 kg/m   Alert NAD Incision clean.   Abdomen, ND no peripheral edema   Diagnostic Studies & Laboratory data:  Path:  FINAL MICROSCOPIC DIAGNOSIS:  A. LUNG, RIGHT MIDDLE LOBE, LOBECTOMY:      Invasive acinar adenocarcinoma, moderately differentiated.      Tumor size: 0.8 cm.      No visceral pleural invasion identified.      No lymphovascular invasion identified.      Surgical margins of resection are negative for carcinoma.      See oncology table.  B. LYMPH NODE, LEVEL 9, EXCISION:      One lymph node, negative for metastatic carcinoma (0/1).  C. LYMPH NODE, LEVEL 7, EXCISION:      One lymph node, negative for metastatic carcinoma (0/1).  D. LYMPH NODE, LEVEL 4, EXCISION:      One lymph node, negative for metastatic carcinoma (0/1).  ONCOLOGY TABLE:  LUNG: Resection  Synchronous Tumors: Not applicable Total Number of Primary Tumors: One Procedure: Lobectomy Specimen Laterality: Right Tumor Focality: Unifocal Tumor Site: Right middle lobe Tumor Size: 0.8 cm      Total Tumor Size: 0.8 cm      Invasive Tumor Size (applies only to invasive nonmucinous adenocarcinoma with a lepidic           component): 0.8 cm Histologic Type: Invasive acinar adenocarinoma Visceral Pleura Invasion: Not identified Direct Invasion of Adjacent  Structures: No adjacent structures present Lymphovascular Invasion: Not identified Margins: All margins negative for invasive carcinoma      Closest Margin(s) to Invasive Carcinoma: Vascular margin and bronchial margin      Margin(s) Involved by Invasive Carcinoma: Not applicable]       Margin Status for Non-Invasive Tumor: Not applicable Treatment Effect: No known presurgical therapy      Percentage of Residual Viable Tumor: NA Regional Lymph Nodes:      Number of Lymph Nodes Involved: 0                           Nodal Sites with Tumor: NA      Number of Lymph Nodes Examined: 3                      Nodal Sites Examined: Level 4, 7, 9 Distant Metastasis:      Distant Site(s) Involved: Not applicable Pathologic Stage Classification (pTNM, AJCC 8th Edition): pT1a, pN0     Assessment / Plan:   73 y.o. female s/p RMLectomy for  T1aN0M0 adenocarcinoma.  I have made a referral to medical oncology, along with a 1 month follow-up with a CXR.  Janet Pierce 10/19/2023 8:28 AM

## 2023-10-20 ENCOUNTER — Telehealth: Payer: Self-pay | Admitting: Physical Medicine and Rehabilitation

## 2023-10-20 NOTE — Telephone Encounter (Signed)
 Pt request an appointment to see Dr Eldonna

## 2023-10-29 ENCOUNTER — Other Ambulatory Visit: Payer: Self-pay

## 2023-10-29 NOTE — Progress Notes (Signed)
 The proposed treatment discussed in conference is for discussion purpose only and is not a binding recommendation.  The patients have not been physically examined, or presented with their treatment options.  Therefore, final treatment plans cannot be decided.

## 2023-10-30 ENCOUNTER — Ambulatory Visit: Admitting: Thoracic Surgery (Cardiothoracic Vascular Surgery)

## 2023-11-11 ENCOUNTER — Ambulatory Visit: Admitting: Physical Medicine and Rehabilitation

## 2023-11-16 ENCOUNTER — Telehealth: Payer: Self-pay | Admitting: *Deleted

## 2023-11-16 NOTE — Telephone Encounter (Signed)
 Patient contacted office regarding upper abdominal soreness. Attempted to call patient back. LVM for return call.

## 2023-11-17 ENCOUNTER — Emergency Department (HOSPITAL_COMMUNITY)

## 2023-11-17 ENCOUNTER — Ambulatory Visit: Payer: Self-pay

## 2023-11-17 ENCOUNTER — Emergency Department (HOSPITAL_COMMUNITY)
Admission: EM | Admit: 2023-11-17 | Discharge: 2023-11-17 | Disposition: A | Discharge status: Home / Self Care | Attending: Emergency Medicine | Admitting: Emergency Medicine

## 2023-11-17 ENCOUNTER — Encounter (HOSPITAL_COMMUNITY): Payer: Self-pay | Admitting: Emergency Medicine

## 2023-11-17 ENCOUNTER — Encounter: Payer: Self-pay | Admitting: *Deleted

## 2023-11-17 ENCOUNTER — Other Ambulatory Visit: Payer: Self-pay

## 2023-11-17 DIAGNOSIS — R1013 Epigastric pain: Secondary | ICD-10-CM | POA: Insufficient documentation

## 2023-11-17 DIAGNOSIS — R1010 Upper abdominal pain, unspecified: Secondary | ICD-10-CM

## 2023-11-17 DIAGNOSIS — R110 Nausea: Secondary | ICD-10-CM | POA: Diagnosis not present

## 2023-11-17 LAB — CBC
HCT: 37.4 % (ref 36.0–46.0)
Hemoglobin: 12.3 g/dL (ref 12.0–15.0)
MCH: 27.8 pg (ref 26.0–34.0)
MCHC: 32.9 g/dL (ref 30.0–36.0)
MCV: 84.4 fL (ref 80.0–100.0)
Platelets: 316 K/uL (ref 150–400)
RBC: 4.43 MIL/uL (ref 3.87–5.11)
RDW: 12.5 % (ref 11.5–15.5)
WBC: 4.1 K/uL (ref 4.0–10.5)
nRBC: 0 % (ref 0.0–0.2)

## 2023-11-17 LAB — URINALYSIS, ROUTINE W REFLEX MICROSCOPIC
Bacteria, UA: NONE SEEN
Bilirubin Urine: NEGATIVE
Glucose, UA: NEGATIVE mg/dL
Ketones, ur: NEGATIVE mg/dL
Leukocytes,Ua: NEGATIVE
Nitrite: NEGATIVE
Protein, ur: NEGATIVE mg/dL
Specific Gravity, Urine: 1.018 (ref 1.005–1.030)
pH: 5 (ref 5.0–8.0)

## 2023-11-17 LAB — COMPREHENSIVE METABOLIC PANEL WITH GFR
ALT: 22 U/L (ref 0–44)
AST: 19 U/L (ref 15–41)
Albumin: 4 g/dL (ref 3.5–5.0)
Alkaline Phosphatase: 89 U/L (ref 38–126)
Anion gap: 11 (ref 5–15)
BUN: 11 mg/dL (ref 8–23)
CO2: 28 mmol/L (ref 22–32)
Calcium: 9.5 mg/dL (ref 8.9–10.3)
Chloride: 100 mmol/L (ref 98–111)
Creatinine, Ser: 0.72 mg/dL (ref 0.44–1.00)
GFR, Estimated: >60 mL/min (ref >60)
Glucose, Bld: 132 mg/dL — ABNORMAL HIGH (ref 70–99)
Potassium: 3.4 mmol/L — ABNORMAL LOW (ref 3.5–5.1)
Sodium: 139 mmol/L (ref 135–145)
Total Bilirubin: 1.4 mg/dL — ABNORMAL HIGH (ref 0.0–1.2)
Total Protein: 7.9 g/dL (ref 6.5–8.1)

## 2023-11-17 LAB — LIPASE, BLOOD: Lipase: 29 U/L (ref 11–51)

## 2023-11-17 MED ORDER — DICYCLOMINE HCL 20 MG PO TABS: ORAL_TABLET | ORAL | 0 refills | Status: AC

## 2023-11-17 MED ORDER — PANTOPRAZOLE SODIUM 40 MG IV SOLR
40.0000 mg | Freq: Once | INTRAVENOUS | Status: AC
  Administered 2023-11-17: 40 mg via INTRAVENOUS
  Filled 2023-11-17: qty 10

## 2023-11-17 MED ORDER — ONDANSETRON HCL 4 MG/2ML IJ SOLN
4.0000 mg | Freq: Once | INTRAMUSCULAR | Status: DC
  Filled 2023-11-17: qty 2

## 2023-11-17 MED ORDER — IOHEXOL 300 MG/ML  SOLN
100.0000 mL | Freq: Once | INTRAMUSCULAR | Status: AC | PRN
  Administered 2023-11-17: 100 mL via INTRAVENOUS

## 2023-11-17 MED ORDER — PANTOPRAZOLE SODIUM 40 MG PO TBEC: 40.0000 mg | DELAYED_RELEASE_TABLET | Freq: Every day | ORAL | 1 refills | Status: DC

## 2023-11-17 NOTE — Discharge Instructions (Signed)
 Follow-up with your family doctor next week for recheck.

## 2023-11-17 NOTE — ED Provider Triage Note (Signed)
 Emergency Medicine Provider Triage Evaluation Note  Janet Pierce , a 73 y.o. female  was evaluated in triage.  Pt complains of upper abdominal discomfort, has been persistent over the last 8 days,.  Review of Systems  Positive: As above Negative:   Physical Exam  BP 134/81   Pulse 87   Temp 98.3 F (36.8 C) (Oral)   Resp 16   Ht 5' 3 (1.6 m)   Wt 61.7 kg   SpO2 100%   BMI 24.09 kg/m  Gen:   Awake, no distress   Resp:  Normal effort  MSK:   Moves extremities without difficulty  Other:    Medical Decision Making  Medically screening exam initiated at 4:29 PM.  Appropriate orders placed.  Jozee Hammer was informed that the remainder of the evaluation will be completed by another provider, this initial triage assessment does not replace that evaluation, and the importance of remaining in the ED until their evaluation is complete.  Initial lab workup ordered by nursing staff, and initiated orders for CT of the abdomen with contrast.   Myriam Dorn BROCKS, PA 11/17/23 1637

## 2023-11-17 NOTE — ED Notes (Signed)
 Gave patient sandwich and drink and updated her.  MD will see her in a couple of minutes.

## 2023-11-17 NOTE — Telephone Encounter (Signed)
 FYI

## 2023-11-17 NOTE — ED Notes (Signed)
 Daughter called and given update.  She will call back for further later.

## 2023-11-17 NOTE — ED Notes (Signed)
 Patient to CT.

## 2023-11-17 NOTE — Telephone Encounter (Signed)
 FYI Only or Action Required?: FYI only for provider.  Patient was last seen in primary care on 09/10/2023 by Mahlon Comer BRAVO, MD.  Called Nurse Triage reporting Abdominal Pain.  Symptoms began a week ago.  Interventions attempted: Rest, hydration, or home remedies.  Symptoms are: gradually worsening.  Triage Disposition: Go to ED Now (Notify PCP)  Patient/caregiver understands and will follow disposition?: Yes Reason for Disposition  [1] Pain lasts > 10 minutes AND [2] age > 75 AND [3] at least one cardiac risk factor (e.g., diabetes, high cholesterol, hypertension, obesity, smoker or strong family history of heart disease)  Answer Assessment - Initial Assessment Questions June 23rd had lung lobectomy and lymph node removals. Reports 6/10 upper mid abdominal pain x 8-10 days that radiates left and right. Due to age, comorbidities, recent sx, pt triaged to ED and was agreeable. Advised to call 911 if symptoms worsen before arrival or if she would prefer EMS.  1. LOCATION: Where does it hurt?      Upper/mid  2. RADIATION: Does the pain shoot anywhere else? (e.g., chest, back)     Move towards left or right  3. ONSET: When did the pain begin? (e.g., minutes, hours or days ago)      8-10 days  4. SUDDEN: Gradual or sudden onset?     Sudden  5. PATTERN Does the pain come and go, or is it constant?     Intermittent  10. OTHER SYMPTOMS: Do you have any other symptoms? (e.g., back pain, diarrhea, fever, urination pain, vomiting)       Flatulence and burping. States having regular BMs  Protocols used: Abdominal Pain - Upper-A-AH Copied from CRM [id number]. Topic: Clinical - Red Word Triage >> Nov 17, 2023 11:48 AM Deleta RAMAN wrote: Red Word that prompted transfer to Nurse Triage: patient has been dealing with stomach pains for the last week or so. She thought it was something bad she may have ate. Patient has previous gastro problems

## 2023-11-17 NOTE — ED Triage Notes (Signed)
 Patient comes in for abdominal pain for the last 8 to 10 days, on June 23rd had lobectomy. Patient denies nausea. Gas noted. Was on pain meds has backed off of pain meds - tramadol , tylenol  and motrin.  Patient did change how takes medication she takes together now.

## 2023-11-18 NOTE — ED Provider Notes (Signed)
 [place] EMERGENCY DEPARTMENT AT [facility] Provider Note   CSN: [id number] Arrival date & time: 11/17/23  1233     Patient presents with: Abdominal Pain   Janet Pierce is a 73 y.o. female.   Pt with epigastric pain.   Some nausea but not vomiting  The history is provided by the patient and medical records. No language interpreter was used.  Abdominal Pain Pain location:  Epigastric Pain quality: aching   Pain radiates to:  Does not radiate Pain severity:  Mild Onset quality:  Sudden Timing:  Constant Progression:  Worsening Chronicity:  New Context: alcohol use   Relieved by:  Nothing Associated symptoms: no chest pain, no cough, no diarrhea, no fatigue and no hematuria        Prior to Admission medications   Medication Sig Start Date End Date Taking? Authorizing Provider  dicyclomine  (BENTYL ) 20 MG tablet Take 1 every 12 hours as needed for abdominal pain 11/17/23  Yes Suzette Pac, MD  pantoprazole  (PROTONIX ) 40 MG tablet Take 1 tablet (40 mg total) by mouth daily. 11/17/23  Yes Caliana Spires, MD  acetaminophen  (TYLENOL ) 500 MG tablet Take 1 tablet (500 mg total) by mouth every 6 (six) hours as needed. 10/13/23   Shyrl Linnie KIDD, MD  amLODipine  (NORVASC ) 10 MG tablet Take 1 tablet (10 mg total) by mouth daily before breakfast. 05/29/23   Tabori, Katherine E, MD  atorvastatin  (LIPITOR) 40 MG tablet Take 1 tablet (40 mg total) by mouth daily. 06/15/23 10/16/23  Lavona Agent, MD  brimonidine  (ALPHAGAN ) 0.2 % ophthalmic solution Place 1 drop into the right eye 2 (two) times daily. 05/18/23   [provider]  Cyanocobalamin  (B-12 PO) Take 1 tablet by mouth daily.    [provider]  docusate sodium  (COLACE) 100 MG capsule Take 1 capsule (100 mg total) by mouth daily. 10/13/23 10/12/24  Shyrl Linnie KIDD, MD  dorzolamide -timolol  (COSOPT ) 2-0.5 % ophthalmic solution Place 1 drop into the right eye 2 (two) times daily. 05/18/23   [provider]  LUMIGAN 0.01 % SOLN Place 1 drop into the right eye at bedtime. 03/13/20   [provider]  Menthol-Methyl Salicylate (MUSCLE RUB EX) Apply 1 Application topically as needed (Pain).    [provider]  Polyethyl Glycol-Propyl Glycol (SYSTANE FREE OP) Apply 1 drop to eye as needed (Dry eye).    [provider]  tiZANidine  (ZANAFLEX ) 4 MG tablet Take 1 tablet (4 mg total) by mouth every 6 (six) hours as needed for muscle spasms. 05/13/23   Tabori, Katherine E, MD  traMADol  (ULTRAM ) 50 MG tablet Take 1 tablet (50 mg total) by mouth every 6 (six) hours as needed (mild pain). 10/13/23   Lightfoot, Linnie KIDD, MD  Vitamin D , Ergocalciferol , (DRISDOL ) 1.25 MG (50000 UNIT) CAPS capsule Take 1 capsule (50,000 Units total) by mouth every 7 (seven) days. 09/15/23   Tabori, Katherine E, MD    Allergies: Patient has no known allergies.    Review of Systems  Constitutional:  Negative for appetite change and fatigue.  HENT:  Negative for congestion, ear discharge and sinus pressure.   Eyes:  Negative for discharge.  Respiratory:  Negative for cough.   Cardiovascular:  Negative for chest pain.  Gastrointestinal:  Positive for abdominal pain. Negative for diarrhea.  Genitourinary:  Negative for frequency and hematuria.  Musculoskeletal:  Negative for back pain.  Skin:  Negative for rash.  Neurological:  Negative for seizures and headaches.  Psychiatric/Behavioral:  Negative for hallucinations.     Updated Vital Signs BP (!) 109/99   Pulse 95   Temp 97.8 F (36.6 C) (Oral)   Resp 17   Ht 5' 3 (1.6 m)   Wt 61.7 kg   SpO2 98%   BMI 24.09 kg/m   Physical Exam Vitals and nursing note reviewed.  Constitutional:      Appearance: She is well-developed.  HENT:     Head: Normocephalic.     Nose: Nose normal.  Eyes:     General: No scleral icterus.    Conjunctiva/sclera: Conjunctivae normal.  Neck:     Thyroid : No thyromegaly.  Cardiovascular:     Rate  and Rhythm: Normal rate and regular rhythm.     Heart sounds: No murmur heard.    No friction rub. No gallop.  Pulmonary:     Breath sounds: No stridor. No wheezing or rales.  Chest:     Chest wall: No tenderness.  Abdominal:     General: There is no distension.     Tenderness: There is abdominal tenderness. There is no rebound.  Musculoskeletal:        General: Normal range of motion.     Cervical back: Neck supple.  Lymphadenopathy:     Cervical: No cervical adenopathy.  Skin:    Findings: No erythema or rash.  Neurological:     Mental Status: She is alert and oriented to person, place, and time.     Motor: No abnormal muscle tone.     Coordination: Coordination normal.  Psychiatric:        Behavior: Behavior normal.     (all labs ordered are listed, but only abnormal results are displayed) Labs Reviewed  COMPREHENSIVE METABOLIC PANEL WITH GFR - Abnormal; Notable for the following components:      Result Value   Potassium 3.4 (*)    Glucose, Bld 132 (*)    Total Bilirubin 1.4 (*)    All other components within normal limits  URINALYSIS, ROUTINE W REFLEX MICROSCOPIC - Abnormal; Notable for the following components:   APPearance HAZY (*)    Hgb urine dipstick LARGE (*)    All other components within normal limits  LIPASE, BLOOD  CBC    EKG: None  Radiology: CT ABDOMEN PELVIS W CONTRAST Result Date: 11/17/2023 CLINICAL DATA:  Postoperative abdominal pain EXAM: CT ABDOMEN AND PELVIS WITH CONTRAST TECHNIQUE: Multidetector CT imaging of the abdomen and pelvis was performed using the standard protocol following bolus administration of intravenous contrast. RADIATION DOSE REDUCTION: This exam was performed according to the departmental dose-optimization program which includes automated exposure control, adjustment of the mA and/or kV according to patient size and/or use of iterative reconstruction technique. CONTRAST:  OMNIPAQUE  IOHEXOL  300 MG/ML  SOLN COMPARISON:   08/11/2021 FINDINGS: Lower chest: Status post partial right lung resection. Mild cardiomegaly. Small hiatal hernia. No acute abnormality. Hepatobiliary: Mild hepatic steatosis. No enhancing intrahepatic mass. No intra or extrahepatic biliary ductal dilation. Gallbladder unremarkable. Pancreas: Unremarkable Spleen: Unremarkable Adrenals/Urinary Tract: Adrenal glands are unremarkable. Kidneys are normal, without renal calculi, focal lesion, or hydronephrosis. Bladder is unremarkable. Stomach/Bowel: Moderate sigmoid diverticulosis. Stomach, small bowel, and large bowel are otherwise unremarkable. Appendix normal. No evidence of obstruction or focal inflammation. No free intraperitoneal gas or fluid. 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 Mild aortoiliac atherosclerotic calcification. No aortic aneurysm. No pathologic adenopathy within  the abdomen and pelvis. Reproductive: Uterus and bilateral adnexa are unremarkable. Other: No abdominal wall hernia or abnormality. No abdominopelvic ascites. Musculoskeletal: Circumscribed lytic lesion within the right acetabular roof demonstrating sclerotic margins likely represents a a subchondral cyst or unicameral simple cyst. Marked thoracolumbar levoscoliosis, apex left at T12. No acute bone abnormality. Remote healed fracture deformity of the right superior pubic ramus. IMPRESSION: 1. No acute intra-abdominal pathology identified. No definite explanation for the patient's reported symptoms. 2. Mild cardiomegaly. 3. Small hiatal hernia. 4. Mild hepatic steatosis. 5. Moderate sigmoid diverticulosis. 6. 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 7. Marked thoracolumbar levoscoliosis. Electronically Signed   By: Dorethia Molt M.D.   On:  11/17/2023 19:58     Procedures   Medications Ordered in the ED  pantoprazole  (PROTONIX ) injection 40 mg (40 mg Intravenous Given 11/17/23 1725)  iohexol  (OMNIPAQUE ) 300 MG/ML solution 100 mL (100 mLs Intravenous Contrast Given 11/17/23 1932)                                    Medical Decision Making Amount and/or Complexity of Data Reviewed Labs: ordered.  Risk Prescription drug management.   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  Pt will follow up with pcp     Final diagnoses:  Pain of upper abdomen    ED Discharge Orders          Ordered    pantoprazole  (PROTONIX ) 40 MG tablet  Daily        11/17/23 2125    dicyclomine  (BENTYL ) 20 MG tablet        11/17/23 2125               Suzette Pac, MD 11/18/23 1258

## 2023-11-18 NOTE — ED Provider Notes (Signed)
 [place] EMERGENCY DEPARTMENT AT [facility] Provider Note   CSN: [id number] Arrival date & time: 11/17/23  1233     Patient presents with: Abdominal Pain   Janet Pierce is a 73 y.o. female.   Pt.   Abdominal Pain      Prior to Admission medications   Medication Sig Start Date End Date Taking? Authorizing Provider  dicyclomine  (BENTYL ) 20 MG tablet Take 1 every 12 hours as needed for abdominal pain 11/17/23  Yes Suzette Pac, MD  pantoprazole  (PROTONIX ) 40 MG tablet Take 1 tablet (40 mg total) by mouth daily. 11/17/23  Yes Suzette Pac, MD  acetaminophen  (TYLENOL ) 500 MG tablet Take 1 tablet (500 mg total) by mouth every 6 (six) hours as needed. 10/13/23   Shyrl Linnie KIDD, MD  amLODipine  (NORVASC ) 10 MG tablet Take 1 tablet (10 mg total) by mouth daily before breakfast. 05/29/23   Tabori, Katherine E, MD  atorvastatin  (LIPITOR) 40 MG tablet Take 1 tablet (40 mg total) by mouth daily. 06/15/23 10/16/23  Lavona Agent, MD  brimonidine  (ALPHAGAN ) 0.2 % ophthalmic solution Place 1 drop into the right eye 2 (two) times daily. 05/18/23   [provider]  Cyanocobalamin  (B-12 PO) Take 1 tablet by mouth daily.    [provider]  docusate sodium  (COLACE) 100 MG capsule Take 1 capsule (100 mg total) by mouth daily. 10/13/23 10/12/24  Shyrl Linnie KIDD, MD  dorzolamide -timolol  (COSOPT ) 2-0.5 % ophthalmic solution Place 1 drop into the right eye 2 (two) times daily. 05/18/23   [provider]  LUMIGAN 0.01 % SOLN Place 1 drop into the right eye at bedtime. 03/13/20   [provider]  Menthol-Methyl Salicylate (MUSCLE RUB EX) Apply 1 Application topically as needed (Pain).    [provider]  Polyethyl Glycol-Propyl Glycol (SYSTANE FREE OP) Apply 1 drop to eye as needed (Dry eye).    [provider]  tiZANidine  (ZANAFLEX ) 4 MG tablet Take 1 tablet (4 mg total) by mouth every 6 (six) hours as needed for muscle spasms.  05/13/23   Tabori, Katherine E, MD  traMADol  (ULTRAM ) 50 MG tablet Take 1 tablet (50 mg total) by mouth every 6 (six) hours as needed (mild pain). 10/13/23   Lightfoot, Linnie KIDD, MD  Vitamin D , Ergocalciferol , (DRISDOL ) 1.25 MG (50000 UNIT) CAPS capsule Take 1 capsule (50,000 Units total) by mouth every 7 (seven) days. 09/15/23   Tabori, Katherine E, MD    Allergies: Patient has no known allergies.    Review of Systems  Gastrointestinal:  Positive for abdominal pain.    Updated Vital Signs BP (!) 109/99   Pulse 95   Temp 97.8 F (36.6 C) (Oral)   Resp 17   Ht 5' 3 (1.6 m)   Wt 61.7 kg   SpO2 98%   BMI 24.09 kg/m   Physical Exam  (all labs ordered are listed, but only abnormal results are displayed) Labs Reviewed  COMPREHENSIVE METABOLIC PANEL WITH GFR - Abnormal; Notable for the following components:      Result Value   Potassium 3.4 (*)    Glucose, Bld 132 (*)    Total Bilirubin 1.4 (*)    All other components within normal limits  URINALYSIS, ROUTINE W REFLEX MICROSCOPIC - Abnormal; Notable for the following components:   APPearance HAZY (*)    Hgb urine dipstick LARGE (*)    All other components within normal limits  LIPASE, BLOOD  CBC    EKG: None  Radiology:  CT ABDOMEN PELVIS W CONTRAST Result Date: 11/17/2023 CLINICAL DATA:  Postoperative abdominal pain EXAM: CT ABDOMEN AND PELVIS WITH CONTRAST TECHNIQUE: Multidetector CT imaging of the abdomen and pelvis was performed using the standard protocol following bolus administration of intravenous contrast. RADIATION DOSE REDUCTION: This exam was performed according to the departmental dose-optimization program which includes automated exposure control, adjustment of the mA and/or kV according to patient size and/or use of iterative reconstruction technique. CONTRAST:  OMNIPAQUE  IOHEXOL  300 MG/ML  SOLN COMPARISON:  08/11/2021 FINDINGS: Lower chest: Status post partial right lung resection. Mild cardiomegaly. Small  hiatal hernia. No acute abnormality. Hepatobiliary: Mild hepatic steatosis. No enhancing intrahepatic mass. No intra or extrahepatic biliary ductal dilation. Gallbladder unremarkable. Pancreas: Unremarkable Spleen: Unremarkable Adrenals/Urinary Tract: Adrenal glands are unremarkable. Kidneys are normal, without renal calculi, focal lesion, or hydronephrosis. Bladder is unremarkable. Stomach/Bowel: Moderate sigmoid diverticulosis. Stomach, small bowel, and large bowel are otherwise unremarkable. Appendix normal. No evidence of obstruction or focal inflammation. No free intraperitoneal gas or fluid. 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 Mild aortoiliac atherosclerotic calcification. No aortic aneurysm. No pathologic adenopathy within the abdomen and pelvis. Reproductive: Uterus and bilateral adnexa are unremarkable. Other: No abdominal wall hernia or abnormality. No abdominopelvic ascites. Musculoskeletal: Circumscribed lytic lesion within the right acetabular roof demonstrating sclerotic margins likely represents a a subchondral cyst or unicameral simple cyst. Marked thoracolumbar levoscoliosis, apex left at T12. No acute bone abnormality. Remote healed fracture deformity of the right superior pubic ramus. IMPRESSION: 1. No acute intra-abdominal pathology identified. No definite explanation for the patient's reported symptoms. 2. Mild cardiomegaly. 3. Small hiatal hernia. 4. Mild hepatic steatosis. 5. Moderate sigmoid diverticulosis. 6. 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 7. Marked thoracolumbar levoscoliosis. Electronically Signed   By: Dorethia Molt M.D.   On: 11/17/2023 19:58     Procedures   Medications Ordered in the ED  pantoprazole  (PROTONIX )  injection 40 mg (40 mg Intravenous Given 11/17/23 1725)  iohexol  (OMNIPAQUE ) 300 MG/ML solution 100 mL (100 mLs Intravenous Contrast Given 11/17/23 1932)                                    Medical Decision Making Amount and/or Complexity of Data Reviewed Labs: ordered.  Risk Prescription drug management.        Final diagnoses:  Pain of upper abdomen    ED Discharge Orders          Ordered    pantoprazole  (PROTONIX ) 40 MG tablet  Daily        11/17/23 2125    dicyclomine  (BENTYL ) 20 MG tablet        11/17/23 2125               Suzette Pac, MD 11/18/23 1300

## 2023-11-19 ENCOUNTER — Other Ambulatory Visit: Payer: Self-pay | Admitting: Thoracic Surgery (Cardiothoracic Vascular Surgery)

## 2023-11-19 DIAGNOSIS — R911 Solitary pulmonary nodule: Secondary | ICD-10-CM

## 2023-11-19 NOTE — Progress Notes (Signed)
      [address]       [place] [address]             [phone]        Janet Pierce [facility] Medical Record #[id number] Date of Birth: [birth year]  Referring: [name], NP Primary Care: Mahlon Comer BRAVO, MD Primary Cardiologist:James Lavona, MD  Reason for visit:   follow-up  History of Present Illness:     Janet Pierce present for overall she is doing well.  She continues to have fullness around her abdomen  Physical Exam: BP 107/64 (BP Location: Left Arm, Patient Position: Sitting, Cuff Size: Normal)   Pulse (!) 101   Resp 20   Ht 5' 3 (1.6 m)   Wt 136 lb 8 oz (61.9 kg)   SpO2 98% Comment: RA  BMI 24.18 kg/m   Alert NAD Incision clear   Abdomen, ND no peripheral edema   Diagnostic Studies & Laboratory data: CXR:  clear     Assessment / Plan:   73 y.o. female s/p RMLectomy for  T1aN0M0 adenocarcinoma. Doing well.  She will follow- up in 6months with a CT chest.   Janet Pierce 11/23/2023 10:31 AM

## 2023-11-20 ENCOUNTER — Ambulatory Visit
Attending: Thoracic Surgery (Cardiothoracic Vascular Surgery) | Admitting: Thoracic Surgery (Cardiothoracic Vascular Surgery)

## 2023-11-20 ENCOUNTER — Encounter: Payer: Self-pay | Admitting: Thoracic Surgery (Cardiothoracic Vascular Surgery)

## 2023-11-20 ENCOUNTER — Ambulatory Visit (HOSPITAL_COMMUNITY)
Admission: RE | Admit: 2023-11-20 | Discharge: 2023-11-20 | Disposition: A | Discharge status: Home / Self Care | Source: Ambulatory Visit | Attending: Cardiology | Admitting: Cardiology

## 2023-11-20 VITALS — BP 107/64 | HR 101 | Resp 20 | Ht 63.0 in | Wt 136.5 lb

## 2023-11-20 DIAGNOSIS — R911 Solitary pulmonary nodule: Secondary | ICD-10-CM | POA: Diagnosis present

## 2023-11-20 DIAGNOSIS — Z902 Acquired absence of lung [part of]: Secondary | ICD-10-CM

## 2023-12-01 ENCOUNTER — Ambulatory Visit: Payer: Self-pay

## 2023-12-01 ENCOUNTER — Telehealth: Payer: Self-pay | Admitting: Family Medicine

## 2023-12-01 NOTE — Telephone Encounter (Signed)
 Copied from CRM [id number]. Topic: Clinical - Medication Question >> Dec 01, 2023  2:31 PM Janet Pierce wrote: Reason for CRM: Patient woke up with a case of vertigo. Patient stated that pharmacy is needing Authorization. She is unsure of name (can't see the words due to bottle getting wet. Prescription # [id number] She is feeling light headed and off balance.  *Declined NT

## 2023-12-01 NOTE — Telephone Encounter (Signed)
 Appt tomorrow at 8:40 am

## 2023-12-01 NOTE — Telephone Encounter (Signed)
 Called and got patient an appointment for this also got patient triaged by nurse

## 2023-12-01 NOTE — Telephone Encounter (Signed)
 FYI Only or Action Required?: FYI only for provider.  Patient was last seen in primary care on 09/10/2023 by Mahlon Comer BRAVO, MD.  Called Nurse Triage reporting No chief complaint on file..  Symptoms began today.  Interventions attempted: Rest, hydration, or home remedies.  Symptoms are: completely resolved.  Triage Disposition: See Physician Within 24 Hours  Patient/caregiver understands and will follow disposition?: Yes  Reason for Disposition  [1] NO dizziness now AND [2] age > 13  Answer Assessment - Initial Assessment Questions Patient was transferred to this RN from PCP office for triage. Patient states she was calling to request meclizine  for vertigo. This RN confirmed patient is currently asymptomatic. Patient denies higher acuity questions. Advised pt to call 911 if dizziness returns or if she experiences any new symptoms  due to her age and comorbidities. ED precautions reviewed, pt verbalized understanding.   1. DESCRIPTION: Describe your dizziness.     Dizziness  2. VERTIGO: Do you feel like either you or the room is spinning or tilting?      Yes  3. LIGHTHEADED: Do you feel lightheaded? (e.g., somewhat faint, woozy, weak upon standing)     Denies  4. SEVERITY: How bad is it?  Can you walk?     Able to ambulate  5. ONSET:  When did the dizziness begin?     3 am this morning, resolved when laying down  6. AGGRAVATING FACTORS: Does anything make it worse? (e.g., standing, change in head position)     Standing. Had patient stand slowly while on the phone and she confirmed she remains asymptomatic.  9. OTHER SYMPTOMS: Do you have any other symptoms? (e.g., earache, headache, numbness, tinnitus, vomiting, weakness)     Denies  Protocols used: Dizziness - Vertigo-A-AH

## 2023-12-02 ENCOUNTER — Encounter: Payer: Self-pay | Admitting: Family Medicine

## 2023-12-02 ENCOUNTER — Ambulatory Visit: Admitting: Family Medicine

## 2023-12-02 VITALS — BP 120/62 | HR 86 | Temp 98.1°F | Ht 63.0 in | Wt 137.0 lb

## 2023-12-02 DIAGNOSIS — M62838 Other muscle spasm: Secondary | ICD-10-CM | POA: Diagnosis not present

## 2023-12-02 MED ORDER — MECLIZINE HCL 25 MG PO TABS: 25.0000 mg | ORAL_TABLET | Freq: Three times a day (TID) | ORAL | 3 refills | Status: AC | PRN

## 2023-12-02 NOTE — Progress Notes (Signed)
   Subjective:    Patient ID: Janet Pierce, female    DOB: May 09, 1950, 73 y.o.   MRN: [id number]  HPI Ear pain- pt woke yesterday w/ vertigo, neck pain, and ear pain.  + decreased appetite since lung surgery.  Dizziness has improved today but ear pressure and neck tightness remain.   Review of Systems For ROS see HPI     Objective:   Physical Exam Vitals reviewed.  Constitutional:      General: She is not in acute distress.    Appearance: She is not ill-appearing.  HENT:     Head: Normocephalic and atraumatic.     Right Ear: Tympanic membrane and ear canal normal.     Left Ear: Tympanic membrane and ear canal normal.     Nose: No congestion.     Comments: No TTP over frontal or maxillary sinuses Musculoskeletal:     Cervical back: Tenderness (bilateral trap spasm w/ TTP) present.  Skin:    General: Skin is warm and dry.  Neurological:     General: No focal deficit present.     Mental Status: She is alert and oriented to person, place, and time.  Psychiatric:     Comments: Mildly depressed today           Assessment & Plan:  Trap spasm- new.  Bilateral.  Pt is no longer dizzy but continues to have pressure in ears and neck tightness.  Has intense spasm of traps bilaterally.  Has been under considerable stress w/ recent lung surgery and cancer dx.  She will restart her Tizanidine , add heat and gentle stretching.  Pt expressed understanding and is in agreement w/ plan.

## 2023-12-02 NOTE — Patient Instructions (Addendum)
 Schedule your physical in November Thankfully your ears look ok Your neck muscles are VERY tight START the Tizanidine  for spasm Use a HEATING PAD for muscle pain/tightness Get a massage if possible Use the Tizanidine  as needed for dizziness Make sure you are eating and drinking regularly Call with any questions or concerns Hang in there!!!

## 2023-12-13 ENCOUNTER — Other Ambulatory Visit: Payer: Self-pay | Admitting: Family Medicine

## 2023-12-13 DIAGNOSIS — R7989 Other specified abnormal findings of blood chemistry: Secondary | ICD-10-CM

## 2024-01-13 ENCOUNTER — Ambulatory Visit: Payer: Self-pay

## 2024-01-13 NOTE — Telephone Encounter (Signed)
 FYI Only or Action Required?: FYI only for provider.  Patient was last seen in primary care on 12/02/2023 by Mahlon Comer BRAVO, MD.  Called Nurse Triage reporting No chief complaint on file..  Symptoms began x 2 days ago.  Interventions attempted: OTC medications: Tylenol .  Symptoms are: unchanged.  Triage Disposition: No disposition on file.  Patient/caregiver understands and will follow disposition?:   **Appt. Scheduled with PCP for 9/26**   Copied from CRM #[id number]. Topic: Clinical - Red Word Triage >> Jan 13, 2024  2:09 PM Pinkey ORN wrote: Red Word that prompted transfer to Nurse Triage: Throbbing Pain Right Side (Head) Reason for Disposition  [1] MODERATE headache (e.g., interferes with normal activities) AND [2] present > 24 hours AND [3] unexplained  (Exceptions: Pain medicines not tried, typical migraine, or headache part of viral illness.)  Answer Assessment - Initial Assessment Questions 1. LOCATION: Where does it hurt?      Right side back of head   2. ONSET: When did the headache start? (e.g., minutes, hours, days)     X 2 days  3. PATTERN: Does the pain come and go, or has it been constant since it started?     Intermittent   4. SEVERITY: How bad is the pain? and What does it keep you from doing?  (e.g., Scale 1-10; mild, moderate, or severe)     4/10  5. RECURRENT SYMPTOM: Have you ever had headaches before? If Yes, ask: When was the last time? and What happened that time?      No   6. CAUSE: What do you think is causing the headache?     Unknown   7. MIGRAINE: Have you been diagnosed with migraine headaches? If Yes, ask: Is this headache similar?      No   8. HEAD INJURY: Has there been any recent injury to your head?      No   9. OTHER SYMPTOMS: Do you have any other symptoms? (e.g., fever, stiff neck, eye pain, sore throat, cold symptoms)  Patient always has stiff neck from arthritis.  Tylenol  for pain, mild relief  noted. Appt. Scheduled with PCP for 9/26.  Protocols used: [facility]

## 2024-01-15 ENCOUNTER — Encounter: Payer: Self-pay | Admitting: Family Medicine

## 2024-01-15 ENCOUNTER — Ambulatory Visit: Admitting: Family Medicine

## 2024-01-15 VITALS — BP 102/72 | HR 89 | Temp 98.7°F | Ht 63.0 in | Wt 137.0 lb

## 2024-01-15 DIAGNOSIS — F32A Depression, unspecified: Secondary | ICD-10-CM | POA: Diagnosis not present

## 2024-01-15 DIAGNOSIS — G44209 Tension-type headache, unspecified, not intractable: Secondary | ICD-10-CM | POA: Diagnosis not present

## 2024-01-15 MED ORDER — FLUOXETINE HCL 10 MG PO CAPS: 10.0000 mg | ORAL_CAPSULE | Freq: Every day | ORAL | 3 refills | Status: AC

## 2024-01-15 NOTE — Patient Instructions (Signed)
 Follow up in 3-4 weeks to recheck mood START the Fluoxetine  daily to help w/ mood USE the Tizanidine  as needed for neck spasm/tightness Try and limit your fluid intake after 7 to decrease the number of times you get up overnight Call with any questions or concerns Stay Safe!  Stay Healthy! Hang in there!!!

## 2024-01-15 NOTE — Progress Notes (Signed)
   Subjective:    Patient ID: Janet Pierce, female    DOB: 10-04-1950, 73 y.o.   MRN: [id number]  HPI HA- pt reports she woke Monday morning w/ R occipital HA and 'a lot of neck tension'.  Took Tylenol  w/ relief.  Today reports HA has resolved.  Fatigue- pt reports she is waking up tired.  Pt states she is not getting good sleep b/c she is waking up 'at least 2 times' to urinate.  Had lung surgery in June which showed Stage 1 adenocarcinoma.  Pt admits she has been feeling down recently.  Pt has split w/ her significant other and feels lonely.   Review of Systems For ROS see HPI     Objective:   Physical Exam Vitals reviewed.  Constitutional:      General: She is not in acute distress.    Appearance: She is well-developed. She is not ill-appearing.  HENT:     Head: Normocephalic and atraumatic.  Eyes:     Extraocular Movements: Extraocular movements intact.  Cardiovascular:     Rate and Rhythm: Normal rate and regular rhythm.  Pulmonary:     Effort: Pulmonary effort is normal. No respiratory distress.  Musculoskeletal:     Cervical back: Neck supple. No rigidity.  Skin:    General: Skin is warm and dry.  Neurological:     Mental Status: She is alert.  Psychiatric:     Comments: Flat affect, depressed mood           Assessment & Plan:  Tension HA- new.  Pt has hx of trap spasm and has Tizanidine  available to use prn.  Currently HA has resolved.  The bigger issue is the underlying cause of the trap spasm and tension HA- which is depressed mood.  Will treat as noted.

## 2024-01-15 NOTE — Assessment & Plan Note (Signed)
 New.  Pt has had a very difficult year.  Split w/ her long time significant other.  Had a wedge resection of her lung- to subsequently be dx'd w/ adenocarcinoma.  Sister had brain surgery.  She reports feeling irritable, overwhelmed, fatigued, and down.  Will start low dose fluoxetine  and monitor closely for improvement.  Pt expressed understanding and is in agreement w/ plan.

## 2024-02-01 ENCOUNTER — Other Ambulatory Visit: Payer: Self-pay | Admitting: Family Medicine

## 2024-02-01 NOTE — Telephone Encounter (Unsigned)
 Copied from CRM [id number]. Topic: Clinical - Medication Refill >> Feb 01, 2024 11:43 AM Ahlexyia S wrote: Medication: atorvastatin  (LIPITOR) 40 MG tablet pantoprazole  (PROTONIX ) 40 MG tablet  Has the patient contacted their pharmacy? Yes, pt was told there were no refills remaining and needs to contact clinic. (Agent: If no, request that the patient contact the pharmacy for the refill. If patient does not wish to contact the pharmacy document the reason why and proceed with request.) (Agent: If yes, when and what did the pharmacy advise?)  This is the patient's preferred pharmacy:  [id number] [place] [place], [place] - [address] [place] [address] Phone: [phone] Fax: [phone]  Is this the correct pharmacy for this prescription? Yes If no, delete pharmacy and type the correct one.   Has the prescription been filled recently? No  Is the patient out of the medication? Yes  Has the patient been seen for an appointment in the last year OR does the patient have an upcoming appointment? Yes  Can we respond through MyChart? Yes  Agent: Please be advised that Rx refills may take up to 3 business days. We ask that you follow-up with your pharmacy.

## 2024-02-02 MED ORDER — ATORVASTATIN CALCIUM 40 MG PO TABS: 40.0000 mg | ORAL_TABLET | Freq: Every day | ORAL | 3 refills | Status: AC

## 2024-02-02 MED ORDER — PANTOPRAZOLE SODIUM 40 MG PO TBEC: 40.0000 mg | DELAYED_RELEASE_TABLET | Freq: Every day | ORAL | 1 refills | Status: AC

## 2024-02-16 ENCOUNTER — Ambulatory Visit: Admitting: Family Medicine

## 2024-02-18 ENCOUNTER — Ambulatory Visit (INDEPENDENT_AMBULATORY_CARE_PROVIDER_SITE_OTHER): Admitting: Family Medicine

## 2024-02-18 ENCOUNTER — Encounter: Payer: Self-pay | Admitting: Family Medicine

## 2024-02-18 VITALS — BP 104/60 | HR 80 | Temp 97.9°F | Ht 63.0 in | Wt 133.5 lb

## 2024-02-18 DIAGNOSIS — F32A Depression, unspecified: Secondary | ICD-10-CM

## 2024-02-18 DIAGNOSIS — R5383 Other fatigue: Secondary | ICD-10-CM

## 2024-02-18 LAB — CBC WITH DIFFERENTIAL/PLATELET
Basophils Absolute: 0 K/uL (ref 0.0–0.1)
Basophils Relative: 1.1 % (ref 0.0–3.0)
Eosinophils Absolute: 0.1 K/uL (ref 0.0–0.7)
Eosinophils Relative: 2.1 % (ref 0.0–5.0)
HCT: 36.4 % (ref 36.0–46.0)
Hemoglobin: 12.1 g/dL (ref 12.0–15.0)
Lymphocytes Relative: 31.6 % (ref 12.0–46.0)
Lymphs Abs: 1.3 K/uL (ref 0.7–4.0)
MCHC: 33.3 g/dL (ref 30.0–36.0)
MCV: 82.7 fl (ref 78.0–100.0)
Monocytes Absolute: 0.4 K/uL (ref 0.1–1.0)
Monocytes Relative: 9.1 % (ref 3.0–12.0)
Neutro Abs: 2.4 K/uL (ref 1.4–7.7)
Neutrophils Relative %: 56.1 % (ref 43.0–77.0)
Platelets: 286 K/uL (ref 150.0–400.0)
RBC: 4.4 Mil/uL (ref 3.87–5.11)
RDW: 13.5 % (ref 11.5–15.5)
WBC: 4.2 K/uL (ref 4.0–10.5)

## 2024-02-18 LAB — TSH: TSH: 1.65 u[IU]/mL (ref 0.35–5.50)

## 2024-02-18 LAB — B12 AND FOLATE PANEL
Folate: 23.7 ng/mL (ref >5.9)
Vitamin B-12: 593 pg/mL (ref 211–911)

## 2024-02-18 LAB — HEPATIC FUNCTION PANEL
ALT: 21 U/L (ref 0–35)
AST: 19 U/L (ref 0–37)
Albumin: 4.3 g/dL (ref 3.5–5.2)
Alkaline Phosphatase: 72 U/L (ref 39–117)
Bilirubin, Direct: 0.2 mg/dL (ref 0.0–0.3)
Total Bilirubin: 0.7 mg/dL (ref 0.2–1.2)
Total Protein: 7.5 g/dL (ref 6.0–8.3)

## 2024-02-18 LAB — BASIC METABOLIC PANEL WITH GFR
BUN: 10 mg/dL (ref 6–23)
CO2: 34 meq/L — ABNORMAL HIGH (ref 19–32)
Calcium: 9.4 mg/dL (ref 8.4–10.5)
Chloride: 99 meq/L (ref 96–112)
Creatinine, Ser: 0.66 mg/dL (ref 0.40–1.20)
GFR: 87.34 mL/min (ref >60.00)
Glucose, Bld: 68 mg/dL — ABNORMAL LOW (ref 70–99)
Potassium: 3.6 meq/L (ref 3.5–5.1)
Sodium: 139 meq/L (ref 135–145)

## 2024-02-18 NOTE — Patient Instructions (Signed)
 Follow up in 6 weeks to recheck fatigue We'll notify you of your lab results and make any changes if needed Continue the Fluoxetine  daily Make sure you are resting when you need to rest- you don't need to push yourself! Call with any questions or concerns Stay Safe!  Stay Healthy! Happy Thanksgiving!!

## 2024-02-18 NOTE — Progress Notes (Signed)
   Subjective:    Patient ID: Janet Pierce, female    DOB: 1950-08-09, 73 y.o.   MRN: [id number]  HPI Depression- at last visit we started Fluoxetine  10mg  daily as she has had a lot going on this year.  Pt reports feeling a little more 'even'.  Fatigue- ongoing issue.  Is trying to increase her protein intake.  Wonders if there's an underlying cause for sxs   Review of Systems For ROS see HPI     Objective:   Physical Exam Vitals reviewed.  Constitutional:      General: She is not in acute distress.    Appearance: Normal appearance. She is not ill-appearing.  HENT:     Head: Normocephalic and atraumatic.  Eyes:     Extraocular Movements: Extraocular movements intact.     Conjunctiva/sclera: Conjunctivae normal.  Cardiovascular:     Rate and Rhythm: Normal rate and regular rhythm.  Pulmonary:     Effort: Pulmonary effort is normal. No respiratory distress.     Breath sounds: No wheezing or rhonchi.  Musculoskeletal:     Cervical back: Neck supple.  Lymphadenopathy:     Cervical: No cervical adenopathy.  Skin:    General: Skin is warm and dry.  Neurological:     General: No focal deficit present.     Mental Status: She is alert and oriented to person, place, and time.  Psychiatric:        Mood and Affect: Mood normal.        Behavior: Behavior normal.        Thought Content: Thought content normal.           Assessment & Plan:  Fatigue- new.  Again discussed that this may be emotional exhaustion and we may need to titrate the Fluoxetine .  Check labs to assess for underlying causes.  Treat any abnormalities if present.

## 2024-02-18 NOTE — Assessment & Plan Note (Signed)
 Somewhat improved since starting Fluoxetine  10mg  daily at last visit.  Feels things are a little more 'settled' since starting the medication, more 'even'.  No med changes at this time.  Will follow.

## 2024-02-19 LAB — IRON,TIBC AND FERRITIN PANEL
%SAT: 22 % (ref 16–45)
Ferritin: 33 ng/mL (ref 16–288)
Iron: 90 ug/dL (ref 45–160)
TIBC: 407 ug/dL (ref 250–450)

## 2024-02-19 LAB — VITAMIN D 25 HYDROXY (VIT D DEFICIENCY, FRACTURES): VITD: 40.3 ng/mL (ref 30.00–100.00)

## 2024-02-22 ENCOUNTER — Encounter: Payer: Self-pay | Admitting: Radiology

## 2024-03-02 ENCOUNTER — Ambulatory Visit: Payer: Self-pay | Admitting: Family Medicine

## 2024-03-11 ENCOUNTER — Other Ambulatory Visit: Payer: Self-pay | Admitting: Family Medicine

## 2024-03-11 DIAGNOSIS — R7989 Other specified abnormal findings of blood chemistry: Secondary | ICD-10-CM

## 2024-03-11 MED ORDER — VITAMIN D (ERGOCALCIFEROL) 1.25 MG (50000 UNIT) PO CAPS: 50000.0000 [IU] | ORAL_CAPSULE | ORAL | 0 refills | Status: AC

## 2024-03-11 NOTE — Telephone Encounter (Signed)
 Copied from CRM [id number]. Topic: Clinical - Medication Refill >> Mar 11, 2024  1:33 PM Franky GRADE wrote: Medication: Vitamin D , Ergocalciferol , (DRISDOL ) 1.25 MG (50000 UNIT) CAPS capsule [[id number]]  Has the patient contacted their pharmacy? No (Agent: If no, request that the patient contact the pharmacy for the refill. If patient does not wish to contact the pharmacy document the reason why and proceed with request.) (Agent: If yes, when and what did the pharmacy advise?)  This is the patient's preferred pharmacy:  [id number] [place], [place] - [address] [place] [address] Phone: [phone] Fax: [phone]  Is this the correct pharmacy for this prescription? Yes If no, delete pharmacy and type the correct one.   Has the prescription been filled recently? No  Is the patient out of the medication? Yes  Has the patient been seen for an appointment in the last year OR does the patient have an upcoming appointment? Yes  Can we respond through MyChart? No  Agent: Please be advised that Rx refills may take up to 3 business days. We ask that you follow-up with your pharmacy.

## 2024-04-01 ENCOUNTER — Other Ambulatory Visit: Payer: Self-pay | Admitting: Thoracic Surgery (Cardiothoracic Vascular Surgery)

## 2024-04-01 DIAGNOSIS — R911 Solitary pulmonary nodule: Secondary | ICD-10-CM

## 2024-04-08 ENCOUNTER — Telehealth: Payer: Self-pay

## 2024-04-08 NOTE — Telephone Encounter (Signed)
 Agree w/ calling their office to determine next steps in scheduling

## 2024-04-08 NOTE — Telephone Encounter (Signed)
 Called patient and she stated this order was from Dr.Lightfoot and it does look like he did place the order for this to be completed on 04/01/2024 and the order does say STAT but she has not heard from anyone to schedule this appointment. I did recommend patient to call his office to see of they can give her a little more information or resend the order to be able to complete this.

## 2024-04-08 NOTE — Telephone Encounter (Unsigned)
 Copied from CRM #[id number]. Topic: Clinical - Request for Lab/Test Order >> Apr 08, 2024  1:35 PM Berneda FALCON wrote: Reason for CRM: Patient state she is supposed to have a CT Scan ordered for a 6 month follow up and needs Dr. Mahlon to order this soon because it needs to be done 1-2 weeks before appt (scheduled 1/9). Can we please place this order ASAP for her to get this going so she can make an appt for the CT?

## 2024-04-19 ENCOUNTER — Ambulatory Visit (HOSPITAL_COMMUNITY)
Admission: RE | Admit: 2024-04-19 | Discharge: 2024-04-19 | Disposition: A | Discharge status: Home / Self Care | Source: Ambulatory Visit | Attending: Thoracic Surgery (Cardiothoracic Vascular Surgery) | Admitting: Thoracic Surgery (Cardiothoracic Vascular Surgery)

## 2024-04-19 DIAGNOSIS — R911 Solitary pulmonary nodule: Secondary | ICD-10-CM | POA: Insufficient documentation

## 2024-04-29 ENCOUNTER — Ambulatory Visit: Admitting: Thoracic Surgery (Cardiothoracic Vascular Surgery)

## 2024-05-13 ENCOUNTER — Ambulatory Visit
Attending: Thoracic Surgery (Cardiothoracic Vascular Surgery) | Admitting: Thoracic Surgery (Cardiothoracic Vascular Surgery)

## 2024-05-13 DIAGNOSIS — Z902 Acquired absence of lung [part of]: Secondary | ICD-10-CM | POA: Diagnosis not present

## 2024-05-13 NOTE — Progress Notes (Signed)
" °   °  [address] [place] [address]             [phone]     Patient: Home Provider: Office Consent for Telemedicine visit obtained.  Todays visit was completed via a real-time telehealth (see specific modality noted below). The patient/authorized person provided oral consent at the time of the visit to engage in a telemedicine encounter with the present provider at [facility]. The patient/authorized person was informed of the potential benefits, limitations, and risks of telemedicine. The patient/authorized person expressed understanding that the laws that protect confidentiality also apply to telemedicine. The patient/authorized person acknowledged understanding that telemedicine does not provide emergency services and that he or she would need to call 911 or proceed to the nearest hospital for help if such a need arose.   Total time spent in the clinical discussion 10 minutes.  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  I personally reviewed her CT scan, and there is no evidence of recurrence or any concerning nodules.  She remains tobacco free.  She will follow-up in another 6 months with another CT scan.  "
